# Patient Record
Sex: Male | Born: 1945
Health system: Southern US, Community
[De-identification: ages and names within clinical notes are randomized; demographics above are authoritative.]

## PROBLEM LIST (undated history)

## (undated) DIAGNOSIS — N189 Chronic kidney disease, unspecified: Secondary | ICD-10-CM

## (undated) DIAGNOSIS — R600 Localized edema: Secondary | ICD-10-CM

## (undated) DIAGNOSIS — IMO0001 Reserved for inherently not codable concepts without codable children: Secondary | ICD-10-CM

## (undated) DIAGNOSIS — Z950 Presence of cardiac pacemaker: Secondary | ICD-10-CM

## (undated) DIAGNOSIS — K219 Gastro-esophageal reflux disease without esophagitis: Secondary | ICD-10-CM

## (undated) DIAGNOSIS — I442 Atrioventricular block, complete: Secondary | ICD-10-CM

## (undated) DIAGNOSIS — N1832 Chronic kidney disease, stage 3b: Secondary | ICD-10-CM

## (undated) DIAGNOSIS — G5603 Carpal tunnel syndrome, bilateral upper limbs: Secondary | ICD-10-CM

## (undated) DIAGNOSIS — K589 Irritable bowel syndrome without diarrhea: Secondary | ICD-10-CM

## (undated) DIAGNOSIS — I10 Essential (primary) hypertension: Secondary | ICD-10-CM

## (undated) DIAGNOSIS — R011 Cardiac murmur, unspecified: Secondary | ICD-10-CM

## (undated) DIAGNOSIS — E785 Hyperlipidemia, unspecified: Secondary | ICD-10-CM

## (undated) DIAGNOSIS — Z9289 Personal history of other medical treatment: Secondary | ICD-10-CM

## (undated) DIAGNOSIS — M1990 Unspecified osteoarthritis, unspecified site: Secondary | ICD-10-CM

## (undated) DIAGNOSIS — I4891 Unspecified atrial fibrillation: Secondary | ICD-10-CM

## (undated) DIAGNOSIS — I509 Heart failure, unspecified: Secondary | ICD-10-CM

## (undated) DIAGNOSIS — T4145XA Adverse effect of unspecified anesthetic, initial encounter: Secondary | ICD-10-CM

## (undated) DIAGNOSIS — T8859XA Other complications of anesthesia, initial encounter: Secondary | ICD-10-CM

## (undated) DIAGNOSIS — G20A1 Parkinson's disease without dyskinesia, without mention of fluctuations: Secondary | ICD-10-CM

## (undated) DIAGNOSIS — G20 Parkinson's disease: Secondary | ICD-10-CM

## (undated) DIAGNOSIS — I2510 Atherosclerotic heart disease of native coronary artery without angina pectoris: Secondary | ICD-10-CM

## (undated) HISTORY — PX: INSERT / REPLACE / REMOVE PACEMAKER: SUR710

## (undated) HISTORY — DX: Atherosclerotic heart disease of native coronary artery without angina pectoris: I25.10

## (undated) HISTORY — PX: HERNIA REPAIR: SHX51

## (undated) HISTORY — PX: UPPER GI ENDOSCOPY: SHX6162

## (undated) HISTORY — DX: Hyperlipidemia, unspecified: E78.5

## (undated) HISTORY — DX: Essential (primary) hypertension: I10

## (undated) HISTORY — DX: Gastro-esophageal reflux disease without esophagitis: K21.9

## (undated) HISTORY — DX: Parkinson's disease without dyskinesia, without mention of fluctuations: G20.A1

## (undated) HISTORY — DX: Parkinson's disease: G20

---

## 2009-10-23 ENCOUNTER — Ambulatory Visit: Payer: Self-pay | Admitting: Family Medicine

## 2010-12-09 HISTORY — PX: APPENDECTOMY: SHX54

## 2010-12-22 ENCOUNTER — Ambulatory Visit: Payer: Self-pay | Admitting: Unknown Physician Specialty

## 2010-12-22 ENCOUNTER — Inpatient Hospital Stay: Payer: Self-pay | Admitting: General Surgery

## 2011-05-06 ENCOUNTER — Ambulatory Visit: Payer: Self-pay | Admitting: Endocrinology

## 2012-11-02 ENCOUNTER — Ambulatory Visit: Payer: Self-pay | Admitting: Family Medicine

## 2012-11-02 LAB — COMPREHENSIVE METABOLIC PANEL
Albumin: 4.2 g/dL (ref 3.4–5.0)
Anion Gap: 5 — ABNORMAL LOW (ref 7–16)
BUN: 13 mg/dL (ref 7–18)
Bilirubin,Total: 0.6 mg/dL (ref 0.2–1.0)
Calcium, Total: 8.9 mg/dL (ref 8.5–10.1)
Creatinine: 0.97 mg/dL (ref 0.60–1.30)
EGFR (African American): >60
EGFR (Non-African Amer.): >60
Potassium: 4.2 mmol/L (ref 3.5–5.1)
SGOT(AST): 18 U/L (ref 15–37)
Sodium: 142 mmol/L (ref 136–145)

## 2012-11-02 LAB — CBC WITH DIFFERENTIAL/PLATELET
Basophil %: 0.7 %
HCT: 48 % (ref 40.0–52.0)
Lymphocyte #: 1.2 10*3/uL (ref 1.0–3.6)
Lymphocyte %: 19.1 %
MCHC: 32.8 g/dL (ref 32.0–36.0)
MCV: 89 fL (ref 80–100)
Monocyte #: 0.5 x10 3/mm (ref 0.2–1.0)
Neutrophil #: 4.4 10*3/uL (ref 1.4–6.5)
Neutrophil %: 71.8 %
RDW: 13.4 % (ref 11.5–14.5)

## 2012-11-02 LAB — TSH: Thyroid Stimulating Horm: 1.33 u[IU]/mL

## 2012-11-20 ENCOUNTER — Ambulatory Visit: Payer: Self-pay | Admitting: Internal Medicine

## 2012-11-20 HISTORY — PX: CARDIAC CATHETERIZATION: SHX172

## 2012-12-14 ENCOUNTER — Ambulatory Visit: Payer: Self-pay

## 2013-03-15 ENCOUNTER — Ambulatory Visit (INDEPENDENT_AMBULATORY_CARE_PROVIDER_SITE_OTHER): Payer: BC Managed Care – PPO | Admitting: Cardiovascular Disease

## 2013-03-15 ENCOUNTER — Encounter: Payer: Self-pay | Admitting: Cardiovascular Disease

## 2013-03-15 VITALS — BP 164/84 | HR 62 | Ht 73.0 in | Wt 194.2 lb

## 2013-03-15 DIAGNOSIS — R079 Chest pain, unspecified: Secondary | ICD-10-CM

## 2013-03-15 DIAGNOSIS — R011 Cardiac murmur, unspecified: Secondary | ICD-10-CM | POA: Insufficient documentation

## 2013-03-15 DIAGNOSIS — R109 Unspecified abdominal pain: Secondary | ICD-10-CM

## 2013-03-15 DIAGNOSIS — G20 Parkinson's disease: Secondary | ICD-10-CM

## 2013-03-15 DIAGNOSIS — G20A1 Parkinson's disease without dyskinesia, without mention of fluctuations: Secondary | ICD-10-CM

## 2013-03-15 DIAGNOSIS — I10 Essential (primary) hypertension: Secondary | ICD-10-CM

## 2013-03-15 DIAGNOSIS — R0602 Shortness of breath: Secondary | ICD-10-CM | POA: Insufficient documentation

## 2013-03-15 DIAGNOSIS — E785 Hyperlipidemia, unspecified: Secondary | ICD-10-CM

## 2013-03-15 NOTE — Assessment & Plan Note (Signed)
I am concerned about his murmur. Stress echo did not comment on his valvular disease, only on LV function. We have discussed this with him. We will have him complete a full echocardiogram. Uncertain if he has significant valvular disease that could be contributing to shortness of breath.

## 2013-03-15 NOTE — Assessment & Plan Note (Signed)
Significant weight loss over the past year. Concern about progression of his Parkinson's. He's not eating as much to avoid GI symptoms.

## 2013-03-15 NOTE — Assessment & Plan Note (Signed)
He reports that some of his shortness of breath symptoms have improved without losartan. Echocardiogram to evaluate his murmur and valves. Blood pressure is high which could contribute to shortness of breath.

## 2013-03-15 NOTE — Assessment & Plan Note (Signed)
Blood pressures running high today as well as at home. He is on amlodipine for many years with no side effects. He held amlodipine for possible GI issues which have persisted. He we'll restart amlodipine 5 mg daily with close monitoring of his blood pressure. If no side effects, he could increase the dose back to 10 mg daily.

## 2013-03-15 NOTE — Assessment & Plan Note (Addendum)
Continued abdominal bloating and gas is a major issue for him. Etiology is not clear. Uncertain if this is part of his Parkinson's. He reports that he has tried [employer] production [employer].  Likely unrelated to his cardiac issues

## 2013-03-15 NOTE — Patient Instructions (Addendum)
Please start amlopdine 5 mg daily (1/2 pill) We will schedule you for an echocardiogram for murmur  Hold the cholesterol medication for now  Please call us if you have new issues that need to be addressed before your next appt.  Your physician wants you to follow-up in: 1 month.

## 2013-03-15 NOTE — Assessment & Plan Note (Signed)
Chest pain seems to have improved. Recent cardiac catheterization earlier this year showing no significant CAD.

## 2013-03-15 NOTE — Assessment & Plan Note (Signed)
He has progressive leg weakness, likely from his Parkinson's. We will hold his statin to see if leg strength improves.

## 2013-03-15 NOTE — Progress Notes (Signed)
Patient ID: Matthew Howe, male    DOB: Sep 05, 1946, 67 y.o.   MRN: [id number]  HPI Comments: Matthew Howe is a very pleasant 67 year old gentleman with diagnosis of Parkinson's who currently works Restaurant manager, fast food, history of hypertension, murmur, recent cardiac catheterization showing mild CAD after stress test at outpatient facility showed lateral wall hypokinesis on stress echo, hyperlipidemia who presents to establish care.  Previously reported having chest pain and tightness. Stress echo leading to cardiac catheterization at [facility].  Stress echo was a limited study, ejection fraction estimated at 45% with EF of 50% at peak stress.  Cardiac catheterization 11/20/2012 with 25% proximal LAD disease, followed by 40% lesion. The cardiac catheterization note makes mention of atrial flutter though arrhythmia is not mentioned in any of the clinic notes. He does have a tremor from Parkinson's which is likely causing EKG artifact and  His biggest complaint is GI gas. He has seen GI in consultation, EGD performed. By his report, no abnormalities noted. And denies having foods that will cause his gas. His GI gas is causing significant discomfort. He reports that losartan was causing significant shortness of breath. He stopped losartan 6 weeks ago and shortness of breath seemed to resolve. Legs are getting weaker that he continues to work. Plan is to retire next year  Abdominal CT 12/22/2010 showed acute appendicitis, colonic diverticulosis  EKG today shows normal sinus rhythm with rate 62 beats per minute him a right bundle branch block, left anterior fascicular block Initial EKG showed artifact concerning for atrial flutter. This resolved with a change of his hand position to eliminate tremor     Outpatient Encounter Prescriptions as of 03/15/2013  Medication Sig Dispense Refill  . aspirin 81 MG tablet Take 81 mg by mouth daily.      [name] atorvastatin (LIPITOR) 10 MG tablet Take 10 mg by mouth  daily.      . fluticasone (FLONASE) 50 MCG/ACT nasal spray Place 2 sprays into the nose daily.      . lansoprazole (PREVACID) 15 MG capsule Take 15 mg by mouth daily as needed.      [name] rOPINIRole (REQUIP) 0.25 MG tablet Take 0.25 mg by mouth 2 (two) times daily.         Review of Systems  Constitutional: Negative.   HENT: Negative.   Eyes: Negative.   Respiratory: Negative.   Cardiovascular: Negative.   Gastrointestinal: Negative.   Musculoskeletal: Negative.   Skin: Negative.   Neurological: Negative.   Psychiatric/Behavioral: Negative.   All other systems reviewed and are negative.    BP 164/84  Pulse 62  Ht 6\' 1"  (1.854 m)  Wt 194 lb 4 oz (88.111 kg)  BMI 25.63 kg/m2  Physical Exam  Nursing note and vitals reviewed. Constitutional: He is oriented to person, place, and time. He appears well-developed and well-nourished.  HENT:  Head: Normocephalic.  Nose: Nose normal.  Mouth/Throat: Oropharynx is clear and moist.  Eyes: Conjunctivae are normal. Pupils are equal, round, and reactive to light.  Neck: Normal range of motion. Neck supple. No JVD present.  Cardiovascular: Normal rate, regular rhythm, S1 normal, S2 normal and intact distal pulses.  Exam reveals no gallop and no friction rub.   Murmur heard.  Decrescendo systolic murmur is present with a grade of 2/6  Pulmonary/Chest: Effort normal and breath sounds normal. No respiratory distress. He has no wheezes. He has no rales. He exhibits no tenderness.  Abdominal: Soft. Bowel sounds are normal. He exhibits no distension.  There is no tenderness.  Musculoskeletal: Normal range of motion. He exhibits no edema and no tenderness.  Lymphadenopathy:    He has no cervical adenopathy.  Neurological: He is alert and oriented to person, place, and time. Coordination normal.  Skin: Skin is warm and dry. No rash noted. No erythema.  Psychiatric: He has a normal mood and affect. His behavior is normal. Judgment and thought content  normal.      Assessment and Plan

## 2013-03-19 ENCOUNTER — Other Ambulatory Visit: Payer: BC Managed Care – PPO

## 2013-03-20 ENCOUNTER — Other Ambulatory Visit: Payer: Self-pay

## 2013-03-20 DIAGNOSIS — R0602 Shortness of breath: Secondary | ICD-10-CM

## 2013-03-22 ENCOUNTER — Ambulatory Visit: Payer: Self-pay

## 2013-04-09 ENCOUNTER — Other Ambulatory Visit (INDEPENDENT_AMBULATORY_CARE_PROVIDER_SITE_OTHER): Payer: BC Managed Care – PPO

## 2013-04-09 ENCOUNTER — Telehealth: Payer: Self-pay

## 2013-04-09 ENCOUNTER — Other Ambulatory Visit: Payer: Self-pay

## 2013-04-09 DIAGNOSIS — R011 Cardiac murmur, unspecified: Secondary | ICD-10-CM

## 2013-04-09 NOTE — Telephone Encounter (Signed)
Pt dropped off BP reading log BP=154/84-174/79 HR=62-80 Will forward to Dr. Mariah Milling

## 2013-04-10 NOTE — Telephone Encounter (Signed)
Could add lisinopril 10 mg daily If he develops a cough, call the office

## 2013-04-10 NOTE — Telephone Encounter (Signed)
Echo was essentially normal,   mild to moderate leak of a valve which can be monitored periodically, not a major concern  For his blood pressure, would increase amlodipine to 10 mg daily given his recent numbers which are mildly elevated If he has side effects , such as lightheadedness, would call our office and go back to one half pill

## 2013-04-10 NOTE — Telephone Encounter (Signed)
Pt informed He says he is already taking amlodipine 5 mg BID Will discuss with Dr. Mariah Milling to see how he needs to adjust meds now that he is already taking 10 mg daily

## 2013-04-11 ENCOUNTER — Other Ambulatory Visit: Payer: Self-pay

## 2013-04-11 MED ORDER — LISINOPRIL 10 MG PO TABS: 10.0000 mg | ORAL_TABLET | Freq: Every day | ORAL | Status: DC

## 2013-04-11 MED ORDER — AMLODIPINE BESYLATE 10 MG PO TABS: 5.0000 mg | ORAL_TABLET | Freq: Two times a day (BID) | ORAL | Status: DC

## 2013-04-11 NOTE — Telephone Encounter (Signed)
lmtcb

## 2013-04-11 NOTE — Telephone Encounter (Signed)
Pt informed Understanding verb RX sent to pharm 

## 2013-04-18 ENCOUNTER — Encounter: Payer: Self-pay | Admitting: Cardiovascular Disease

## 2013-04-18 ENCOUNTER — Ambulatory Visit (INDEPENDENT_AMBULATORY_CARE_PROVIDER_SITE_OTHER): Payer: BC Managed Care – PPO | Admitting: Cardiovascular Disease

## 2013-04-18 VITALS — BP 140/72 | HR 58 | Ht 73.0 in | Wt 202.2 lb

## 2013-04-18 DIAGNOSIS — G20 Parkinson's disease: Secondary | ICD-10-CM

## 2013-04-18 DIAGNOSIS — R109 Unspecified abdominal pain: Secondary | ICD-10-CM

## 2013-04-18 DIAGNOSIS — I2510 Atherosclerotic heart disease of native coronary artery without angina pectoris: Secondary | ICD-10-CM

## 2013-04-18 DIAGNOSIS — R011 Cardiac murmur, unspecified: Secondary | ICD-10-CM

## 2013-04-18 DIAGNOSIS — I10 Essential (primary) hypertension: Secondary | ICD-10-CM

## 2013-04-18 DIAGNOSIS — E785 Hyperlipidemia, unspecified: Secondary | ICD-10-CM

## 2013-04-18 NOTE — Assessment & Plan Note (Signed)
Mild CAD by prior cardiac catheterization. No further workup at this time.

## 2013-04-18 NOTE — Patient Instructions (Addendum)
You are doing well. No medication changes were made.  Please call us if you have new issues that need to be addressed before your next appt.  Your physician wants you to follow-up in: 12 months.  You will receive a reminder letter in the mail two months in advance. If you don't receive a letter, please call our office to schedule the follow-up appointment. 

## 2013-04-18 NOTE — Assessment & Plan Note (Signed)
Abdominal pain is somewhat improved on dicyclomine.

## 2013-04-18 NOTE — Assessment & Plan Note (Signed)
We have talked about Parkinson's disease. Encouraged him to stay active, aerobic activity daily.

## 2013-04-18 NOTE — Assessment & Plan Note (Signed)
Murmur is from mild to moderate aortic valve insufficiency. No further workup at this time needed.

## 2013-04-18 NOTE — Progress Notes (Signed)
Patient ID: Matthew Howe, male    DOB: [birth year], 67 y.o.   MRN: [id number]  HPI Comments: Matthew Howe is a very pleasant 67 year old gentleman with diagnosis of Parkinson's who currently works Restaurant manager, fast food, history of hypertension, murmur, recent cardiac catheterization showing mild CAD after stress test at outpatient facility showed lateral wall hypokinesis on stress echo, hyperlipidemia who presents to establish care.  Previously reported having chest pain and tightness. Stress echo leading to cardiac catheterization at [facility].  Stress echo was a limited study, ejection fraction estimated at 45% with EF of 50% at peak stress.  Cardiac catheterization 11/20/2012 with 25% proximal LAD disease, followed by 40% lesion. The cardiac catheterization note makes mention of atrial flutter though arrhythmia is not mentioned in any of the clinic notes. He does have a tremor from Parkinson's which is likely causing EKG artifact  On his last clinic visit, he reported having significant GI gas. He has seen GI in consultation, EGD performed. By his report, no abnormalities noted. In followup today, he has been started on dicyclomine 4 times a day. This seems to have improved his symptoms somewhat. His wife reports when he took a break from work for several days, his symptoms seemed to resolve.  Recent Echocardiogram shows ejection fraction 50-55%, mild to moderate aortic valve insufficiency, otherwise normal study.  On his last clinic visit, he was started on amlodipine, lisinopril. He reports blood pressure has been well-controlled.  Abdominal CT 12/22/2010 showed acute appendicitis, colonic diverticulosis  EKG shows normal sinus rhythm Previous EKGs have had artifact from tremor     Outpatient Encounter Prescriptions as of 04/18/2013  Medication Sig Dispense Refill  . amLODipine (NORVASC) 10 MG tablet Take 0.5 tablets (5 mg total) by mouth 2 (two) times daily.  180 tablet  3  . aspirin  81 MG tablet Take 81 mg by mouth daily.      [name] dicyclomine (BENTYL) 20 MG tablet 20 mg 4 (four) times daily.       . fluticasone (FLONASE) 50 MCG/ACT nasal spray Place 2 sprays into the nose daily.      . lansoprazole (PREVACID) 15 MG capsule Take 15 mg by mouth daily as needed.      [name] lisinopril (PRINIVIL,ZESTRIL) 10 MG tablet Take 1 tablet (10 mg total) by mouth daily.  90 tablet  3  . rOPINIRole (REQUIP) 0.25 MG tablet Take 0.25 mg by mouth 2 (two) times daily.       . [DISCONTINUED] atorvastatin (LIPITOR) 10 MG tablet Take 10 mg by mouth daily.       No facility-administered encounter medications on file as of 04/18/2013.     Review of Systems  Constitutional: Negative.   HENT: Negative.   Eyes: Negative.   Respiratory: Negative.   Cardiovascular: Negative.   Gastrointestinal: Negative.   Musculoskeletal: Negative.   Skin: Negative.   Neurological: Negative.   Psychiatric/Behavioral: Negative.   All other systems reviewed and are negative.    BP 140/72  Pulse 58  Ht 6\' 1"  (1.854 m)  Wt 202 lb 4 oz (91.74 kg)  BMI 26.69 kg/m2  Physical Exam  Nursing note and vitals reviewed. Constitutional: He is oriented to person, place, and time. He appears well-developed and well-nourished.  HENT:  Head: Normocephalic.  Nose: Nose normal.  Mouth/Throat: Oropharynx is clear and moist.  Eyes: Conjunctivae are normal. Pupils are equal, round, and reactive to light.  Neck: Normal range of motion. Neck supple. No JVD present.  Cardiovascular:  Normal rate, regular rhythm, S1 normal, S2 normal and intact distal pulses.  Exam reveals no gallop and no friction rub.   Murmur heard.  Decrescendo systolic murmur is present with a grade of 2/6  Pulmonary/Chest: Effort normal and breath sounds normal. No respiratory distress. He has no wheezes. He has no rales. He exhibits no tenderness.  Abdominal: Soft. Bowel sounds are normal. He exhibits no distension. There is no tenderness.   Musculoskeletal: Normal range of motion. He exhibits no edema and no tenderness.  Lymphadenopathy:    He has no cervical adenopathy.  Neurological: He is alert and oriented to person, place, and time. Coordination normal.  Skin: Skin is warm and dry. No rash noted. No erythema.  Psychiatric: He has a normal mood and affect. His behavior is normal. Judgment and thought content normal.      Assessment and Plan

## 2013-04-18 NOTE — Assessment & Plan Note (Signed)
Blood pressure is well controlled on today's visit. No changes made to the medications. 

## 2013-04-18 NOTE — Assessment & Plan Note (Signed)
Lipitor was held on his last clinic visit. He reports improved arm and leg strength. We will hold his statin for now. Suggested he watch his weight, frequent exercise. Lipid panel once yearly. If lipids trend upwards, we could try an alternate statin or not statin medication.

## 2013-10-22 ENCOUNTER — Telehealth: Payer: Self-pay

## 2013-10-22 NOTE — Telephone Encounter (Signed)
Spoke w/ pt.  He would like to try cutting his amlodipine to 1/2 pill once daily and increasing lisinopril to 20 mg daily. Pt reports that he will monitor his BP and symptoms and let us know if either need to be addressed.  Pt to call if he feels he needs to make any changes in med regimen.

## 2013-10-22 NOTE — Telephone Encounter (Signed)
Spoke w/ pt.  He reports that he is experiencing worsening muscle cramps and stiffness. Reports that he previously had these sx on amlodipine and is pretty sure that this is the current cause. Pt taking amlodipine 10mg  1/2 tab bid. States that BP is under control and he feels that his mind is clearer despite his Parkinsonism, but the muscle issues are worsening. Would like to know if he can try another medication for his BP. Please advise. Thank you!

## 2013-10-22 NOTE — Telephone Encounter (Signed)
Pt called and would like to discuss a possible medication change Amlodipine. States he is aching in his joints, muscles are tight and stiff.

## 2013-10-22 NOTE — Telephone Encounter (Signed)
Options include decreasing the dose of amlodipine down to 5 mg daily, increasing the lisinopril up to 20 mg or 40 mg daily  Other option would be to stop the amlodipine, increase lisinopril up to 40 mg daily His blood pressure runs high, may need additional medication He could call the office if additional medication needed

## 2014-03-24 ENCOUNTER — Other Ambulatory Visit: Payer: Self-pay | Admitting: *Deleted

## 2014-03-24 MED ORDER — LISINOPRIL 10 MG PO TABS: 10.0000 mg | ORAL_TABLET | Freq: Every day | ORAL | Status: DC

## 2014-03-24 NOTE — Telephone Encounter (Signed)
Requested Prescriptions   Signed Prescriptions Disp Refills  . lisinopril (PRINIVIL,ZESTRIL) 10 MG tablet 90 tablet 0    Sig: Take 1 tablet (10 mg total) by mouth daily.    Authorizing Provider: GOLLAN, TIMOTHY J    Ordering User: LOPEZ, MARINA C    

## 2014-04-23 ENCOUNTER — Ambulatory Visit (INDEPENDENT_AMBULATORY_CARE_PROVIDER_SITE_OTHER): Payer: Medicare Other | Admitting: Cardiovascular Disease

## 2014-04-23 ENCOUNTER — Encounter: Payer: Self-pay | Admitting: Cardiovascular Disease

## 2014-04-23 VITALS — BP 128/70 | HR 53 | Ht 73.0 in | Wt 225.5 lb

## 2014-04-23 DIAGNOSIS — I10 Essential (primary) hypertension: Secondary | ICD-10-CM

## 2014-04-23 DIAGNOSIS — I2583 Coronary atherosclerosis due to lipid rich plaque: Principal | ICD-10-CM

## 2014-04-23 DIAGNOSIS — I2510 Atherosclerotic heart disease of native coronary artery without angina pectoris: Secondary | ICD-10-CM

## 2014-04-23 DIAGNOSIS — E785 Hyperlipidemia, unspecified: Secondary | ICD-10-CM

## 2014-04-23 DIAGNOSIS — G20 Parkinson's disease: Secondary | ICD-10-CM

## 2014-04-23 NOTE — Assessment & Plan Note (Signed)
Currently with no symptoms of angina. No further workup at this time. Continue current medication regimen. 

## 2014-04-23 NOTE — Assessment & Plan Note (Addendum)
Most recent lab work not available. Currently not on a statin. He does report cholesterol around 150. We have warned him on his recent weight gain and the effect that can have a blood pressure and cholesterol.

## 2014-04-23 NOTE — Progress Notes (Signed)
Patient ID: Matthew Howe, male    DOB: 07/28/1946, 68 y.o.   MRN: [id number]  HPI Comments: Matthew Howe is a very pleasant 68 year old gentleman with diagnosis of Parkinson's who currently works Investment banker, operational, history of hypertension, murmur, recent cardiac catheterization showing mild CAD after stress test at outpatient facility showed lateral wall hypokinesis on stress echo, hyperlipidemia who presents for routine followup.  He states that overall he is doing well. He does report having recent changes to his Parkinson's medication now with significantly improved tremor. Blood pressure has been excellent on amlodipine 5 mg twice a day with lisinopril daily. He is relatively active, works 2 days per week, other days does mowing and other chores around his house.  He's not sit for long periods of time  Denies having chest pain and tightness.  Previous Stress echo leading to cardiac catheterization at [facility].  Stress echo was a limited study, ejection fraction estimated at 45% with EF of 50% at peak stress.  Cardiac catheterization 11/20/2012 with 25% proximal LAD disease, followed by 40% lesion. The cardiac catheterization note makes mention of atrial flutter though arrhythmia is not mentioned in any of the clinic notes. He does have a tremor from Parkinson's which is likely causing EKG artifact  Recent Echocardiogram shows ejection fraction 50-55%, mild to moderate aortic valve insufficiency, otherwise normal study.  Abdominal CT 12/22/2010 showed acute appendicitis, colonic diverticulosis  EKG shows normal sinus rhythm with rate 53 beats per minute, right bundle branch block, nonspecific ST and T wave abnormality through the anterior precordial leads     Outpatient Encounter Prescriptions as of 04/23/2014  Medication Sig  . amLODipine (NORVASC) 10 MG tablet Take 0.5 tablets (5 mg total) by mouth 2 (two) times daily.  [name] aspirin 81 MG tablet Take 81 mg by mouth daily.  [name]  dicyclomine (BENTYL) 20 MG tablet 20 mg 4 (four) times daily.   . fluticasone (FLONASE) 50 MCG/ACT nasal spray Place 2 sprays into the nose daily.  . lansoprazole (PREVACID) 15 MG capsule Take 15 mg by mouth daily as needed.  [name] lisinopril (PRINIVIL,ZESTRIL) 10 MG tablet Take 1 tablet (10 mg total) by mouth daily.  . selegiline (ELDEPRYL) 5 MG tablet Take 5 mg by mouth 2 (two) times daily with a meal.     Review of Systems  Constitutional: Negative.   HENT: Negative.   Eyes: Negative.   Respiratory: Negative.   Cardiovascular: Negative.   Gastrointestinal: Negative.   Endocrine: Negative.   Musculoskeletal: Negative.   Skin: Negative.   Allergic/Immunologic: Negative.   Neurological: Negative.   Hematological: Negative.   Psychiatric/Behavioral: Negative.   All other systems reviewed and are negative.   BP 128/70  Pulse 53  Ht 6\' 1"  (1.854 m)  Wt 225 lb 8 oz (102.286 kg)  BMI 29.76 kg/m2  Physical Exam  Nursing note and vitals reviewed. Constitutional: He is oriented to person, place, and time. He appears well-developed and well-nourished.  HENT:  Head: Normocephalic.  Nose: Nose normal.  Mouth/Throat: Oropharynx is clear and moist.  Eyes: Conjunctivae are normal. Pupils are equal, round, and reactive to light.  Neck: Normal range of motion. Neck supple. No JVD present.  Cardiovascular: Normal rate, regular rhythm, S1 normal, S2 normal and intact distal pulses.  Exam reveals no gallop and no friction rub.   Murmur heard.  Decrescendo systolic murmur is present with a grade of 2/6  Pulmonary/Chest: Effort normal and breath sounds normal. No respiratory distress. He has  no wheezes. He has no rales. He exhibits no tenderness.  Abdominal: Soft. Bowel sounds are normal. He exhibits no distension. There is no tenderness.  Musculoskeletal: Normal range of motion. He exhibits no edema and no tenderness.  Lymphadenopathy:    He has no cervical adenopathy.  Neurological: He is  alert and oriented to person, place, and time. Coordination normal.  Skin: Skin is warm and dry. No rash noted. No erythema.  Psychiatric: He has a normal mood and affect. His behavior is normal. Judgment and thought content normal.      Assessment and Plan

## 2014-04-23 NOTE — Patient Instructions (Signed)
You are doing well. No medication changes were made.  Please call us if you have new issues that need to be addressed before your next appt.  Your physician wants you to follow-up in: 12 months.  You will receive a reminder letter in the mail two months in advance. If you don't receive a letter, please call our office to schedule the follow-up appointment. 

## 2014-04-23 NOTE — Assessment & Plan Note (Signed)
Significantly improved tremor on his current medications. Very active at baseline

## 2014-04-23 NOTE — Assessment & Plan Note (Signed)
Blood pressure is well controlled on today's visit. No changes made to the medications. 

## 2014-06-30 ENCOUNTER — Other Ambulatory Visit: Payer: Self-pay

## 2014-06-30 MED ORDER — LISINOPRIL 10 MG PO TABS: 10.0000 mg | ORAL_TABLET | Freq: Every day | ORAL | Status: DC

## 2014-09-01 ENCOUNTER — Telehealth: Payer: Self-pay | Admitting: Cardiovascular Disease

## 2014-09-01 NOTE — Telephone Encounter (Signed)
Would decrease the amlodipine down to 5 mg daily Alternative would be to stop the medication, monitor his blood pressure If blood pressure runs high, we will add additional medications

## 2014-09-01 NOTE — Telephone Encounter (Signed)
Spoke w/ pt.  He reports that he has been going on and off of amlodipine for some time due to cramps.  Reports that he thinks it is "messing with my meds and my stomach". He states that when he takes amlodipine, he has leg and stomach cramps, which have been worsening over the last 2-3 weeks, and he is now c/o constipation. States that constipation is worsening to the point that normal doses of OTC meds are no longer working for him.  He states that he does not want to take the amlodipine, as he has Parkinson's and "this is too much...surely something better has come along by now".  He states that although his BP has been well controlled, he would like to try a different type of med.  Please advise.  Thank you .

## 2014-09-01 NOTE — Telephone Encounter (Signed)
Pt had questions about meds please call pt

## 2014-09-02 NOTE — Telephone Encounter (Signed)
Spoke w/ pt's wife.  She will have pt call back, as pt is burying their dog that passed away this morning.

## 2014-09-02 NOTE — Telephone Encounter (Signed)
Spoke w/ pt. Advised him of Dr. Donivan Scull recommendation.  He verbalizes understanding and will decrease the amlodipine to 5 mg. He will call back w/ further questions or concerns.

## 2014-10-29 DIAGNOSIS — R251 Tremor, unspecified: Secondary | ICD-10-CM | POA: Diagnosis not present

## 2014-10-29 DIAGNOSIS — G20 Parkinson's disease: Secondary | ICD-10-CM | POA: Diagnosis not present

## 2014-11-18 ENCOUNTER — Inpatient Hospital Stay: Payer: Self-pay | Admitting: Internal Medicine

## 2014-11-18 DIAGNOSIS — R1030 Lower abdominal pain, unspecified: Secondary | ICD-10-CM | POA: Diagnosis not present

## 2014-11-18 DIAGNOSIS — I4510 Unspecified right bundle-branch block: Secondary | ICD-10-CM | POA: Diagnosis present

## 2014-11-18 DIAGNOSIS — R7989 Other specified abnormal findings of blood chemistry: Secondary | ICD-10-CM | POA: Diagnosis not present

## 2014-11-18 DIAGNOSIS — Z8249 Family history of ischemic heart disease and other diseases of the circulatory system: Secondary | ICD-10-CM | POA: Diagnosis not present

## 2014-11-18 DIAGNOSIS — R079 Chest pain, unspecified: Secondary | ICD-10-CM | POA: Diagnosis not present

## 2014-11-18 DIAGNOSIS — Z79899 Other long term (current) drug therapy: Secondary | ICD-10-CM | POA: Diagnosis not present

## 2014-11-18 DIAGNOSIS — I340 Nonrheumatic mitral (valve) insufficiency: Secondary | ICD-10-CM | POA: Diagnosis not present

## 2014-11-18 DIAGNOSIS — K5900 Constipation, unspecified: Secondary | ICD-10-CM | POA: Diagnosis not present

## 2014-11-18 DIAGNOSIS — Z888 Allergy status to other drugs, medicaments and biological substances status: Secondary | ICD-10-CM | POA: Diagnosis not present

## 2014-11-18 DIAGNOSIS — E785 Hyperlipidemia, unspecified: Secondary | ICD-10-CM | POA: Diagnosis present

## 2014-11-18 DIAGNOSIS — I4892 Unspecified atrial flutter: Secondary | ICD-10-CM | POA: Diagnosis not present

## 2014-11-18 DIAGNOSIS — I10 Essential (primary) hypertension: Secondary | ICD-10-CM | POA: Diagnosis not present

## 2014-11-18 DIAGNOSIS — R001 Bradycardia, unspecified: Secondary | ICD-10-CM | POA: Diagnosis not present

## 2014-11-18 DIAGNOSIS — I2510 Atherosclerotic heart disease of native coronary artery without angina pectoris: Secondary | ICD-10-CM | POA: Diagnosis not present

## 2014-11-18 DIAGNOSIS — J9811 Atelectasis: Secondary | ICD-10-CM | POA: Diagnosis not present

## 2014-11-18 DIAGNOSIS — K219 Gastro-esophageal reflux disease without esophagitis: Secondary | ICD-10-CM | POA: Diagnosis not present

## 2014-11-18 DIAGNOSIS — G20 Parkinson's disease: Secondary | ICD-10-CM | POA: Diagnosis not present

## 2014-11-18 DIAGNOSIS — N179 Acute kidney failure, unspecified: Secondary | ICD-10-CM | POA: Diagnosis not present

## 2014-11-18 DIAGNOSIS — Z800 Family history of malignant neoplasm of digestive organs: Secondary | ICD-10-CM | POA: Diagnosis not present

## 2014-11-18 DIAGNOSIS — R0602 Shortness of breath: Secondary | ICD-10-CM | POA: Diagnosis not present

## 2014-11-18 DIAGNOSIS — I248 Other forms of acute ischemic heart disease: Secondary | ICD-10-CM | POA: Diagnosis not present

## 2014-11-19 DIAGNOSIS — I2510 Atherosclerotic heart disease of native coronary artery without angina pectoris: Secondary | ICD-10-CM

## 2014-11-19 DIAGNOSIS — I340 Nonrheumatic mitral (valve) insufficiency: Secondary | ICD-10-CM

## 2014-11-19 DIAGNOSIS — I4892 Unspecified atrial flutter: Secondary | ICD-10-CM

## 2014-11-19 DIAGNOSIS — R7989 Other specified abnormal findings of blood chemistry: Secondary | ICD-10-CM

## 2014-11-19 DIAGNOSIS — I10 Essential (primary) hypertension: Secondary | ICD-10-CM

## 2014-11-20 ENCOUNTER — Telehealth: Payer: Self-pay

## 2014-11-20 DIAGNOSIS — K5909 Other constipation: Secondary | ICD-10-CM

## 2014-11-20 DIAGNOSIS — R109 Unspecified abdominal pain: Secondary | ICD-10-CM

## 2014-11-20 DIAGNOSIS — G20 Parkinson's disease: Secondary | ICD-10-CM

## 2014-11-20 NOTE — Telephone Encounter (Signed)
Correction:  Vickey Huger, NP

## 2014-11-20 NOTE — Telephone Encounter (Signed)
Pt wife called, states she is checking in the status of his appt with Dr. Allen Norris, GI Dr.. States pt still has aflutter, and is not eating this morning.

## 2014-11-20 NOTE — Telephone Encounter (Signed)
Received message from Dr. Rockey Situ that pt needs referral to GI for constipation, gas, abdominal pain, Parkinson's.   Left detailed message on Matthew Howe's vm that pt has appt w/ Matthew Snider, MD on Mon 2/15 @ 9:30. Pt to arrive at 9:15 @ [place], [address].  Advised that Dr. Allen Norris did not have a sooner appt and provided their office # [phone]. Asked her to call back w/ any questions or concerns.

## 2014-11-21 ENCOUNTER — Inpatient Hospital Stay (HOSPITAL_COMMUNITY)
Admission: EM | Admit: 2014-11-21 | Discharge: 2014-11-22 | DRG: 244 | Disposition: A | Discharge status: Home / Self Care | Payer: Medicare Other | Attending: Internal Medicine | Admitting: Internal Medicine

## 2014-11-21 ENCOUNTER — Encounter (HOSPITAL_COMMUNITY)
Admission: EM | Disposition: A | Discharge status: Home / Self Care | Payer: Self-pay | Source: Home / Self Care | Attending: Internal Medicine

## 2014-11-21 ENCOUNTER — Encounter (HOSPITAL_COMMUNITY): Payer: Self-pay | Admitting: *Deleted

## 2014-11-21 ENCOUNTER — Emergency Department (HOSPITAL_COMMUNITY): Payer: Medicare Other

## 2014-11-21 DIAGNOSIS — R0789 Other chest pain: Secondary | ICD-10-CM | POA: Diagnosis not present

## 2014-11-21 DIAGNOSIS — K219 Gastro-esophageal reflux disease without esophagitis: Secondary | ICD-10-CM | POA: Diagnosis present

## 2014-11-21 DIAGNOSIS — I455 Other specified heart block: Secondary | ICD-10-CM | POA: Diagnosis not present

## 2014-11-21 DIAGNOSIS — I10 Essential (primary) hypertension: Secondary | ICD-10-CM | POA: Diagnosis present

## 2014-11-21 DIAGNOSIS — Z7982 Long term (current) use of aspirin: Secondary | ICD-10-CM

## 2014-11-21 DIAGNOSIS — I442 Atrioventricular block, complete: Principal | ICD-10-CM

## 2014-11-21 DIAGNOSIS — G20 Parkinson's disease: Secondary | ICD-10-CM | POA: Diagnosis present

## 2014-11-21 DIAGNOSIS — I2510 Atherosclerotic heart disease of native coronary artery without angina pectoris: Secondary | ICD-10-CM | POA: Diagnosis present

## 2014-11-21 DIAGNOSIS — R001 Bradycardia, unspecified: Secondary | ICD-10-CM

## 2014-11-21 DIAGNOSIS — E785 Hyperlipidemia, unspecified: Secondary | ICD-10-CM | POA: Diagnosis present

## 2014-11-21 DIAGNOSIS — I5021 Acute systolic (congestive) heart failure: Secondary | ICD-10-CM

## 2014-11-21 DIAGNOSIS — J811 Chronic pulmonary edema: Secondary | ICD-10-CM | POA: Diagnosis not present

## 2014-11-21 DIAGNOSIS — Z8249 Family history of ischemic heart disease and other diseases of the circulatory system: Secondary | ICD-10-CM

## 2014-11-21 DIAGNOSIS — R918 Other nonspecific abnormal finding of lung field: Secondary | ICD-10-CM | POA: Diagnosis not present

## 2014-11-21 DIAGNOSIS — Z959 Presence of cardiac and vascular implant and graft, unspecified: Secondary | ICD-10-CM

## 2014-11-21 DIAGNOSIS — I459 Conduction disorder, unspecified: Secondary | ICD-10-CM

## 2014-11-21 DIAGNOSIS — I517 Cardiomegaly: Secondary | ICD-10-CM | POA: Diagnosis not present

## 2014-11-21 HISTORY — PX: PACEMAKER INSERTION: SHX728

## 2014-11-21 HISTORY — PX: PERMANENT PACEMAKER INSERTION: SHX5480

## 2014-11-21 HISTORY — DX: Atrioventricular block, complete: I44.2

## 2014-11-21 LAB — I-STAT CHEM 8, ED
BUN: 29 mg/dL — ABNORMAL HIGH (ref 6–23)
Calcium, Ion: 1.09 mmol/L — ABNORMAL LOW (ref 1.13–1.30)
Chloride: 102 mmol/L (ref 96–112)
Creatinine, Ser: 1.3 mg/dL (ref 0.50–1.35)
Glucose, Bld: 119 mg/dL — ABNORMAL HIGH (ref 70–99)
HCT: 45 % (ref 39.0–52.0)
Hemoglobin: 15.3 g/dL (ref 13.0–17.0)
POTASSIUM: 4.1 mmol/L (ref 3.5–5.1)
SODIUM: 138 mmol/L (ref 135–145)
TCO2: 19 mmol/L (ref 0–100)

## 2014-11-21 LAB — CBC WITH DIFFERENTIAL/PLATELET
BASOS PCT: 0 % (ref 0–1)
Basophils Absolute: 0 10*3/uL (ref 0.0–0.1)
Eosinophils Absolute: 0 10*3/uL (ref 0.0–0.7)
Eosinophils Relative: 0 % (ref 0–5)
HEMATOCRIT: 44.7 % (ref 39.0–52.0)
HEMOGLOBIN: 15.2 g/dL (ref 13.0–17.0)
Lymphocytes Relative: 9 % — ABNORMAL LOW (ref 12–46)
Lymphs Abs: 1 10*3/uL (ref 0.7–4.0)
MCH: 30.8 pg (ref 26.0–34.0)
MCHC: 34 g/dL (ref 30.0–36.0)
MCV: 90.5 fL (ref 78.0–100.0)
MONOS PCT: 11 % (ref 3–12)
Monocytes Absolute: 1.2 10*3/uL — ABNORMAL HIGH (ref 0.1–1.0)
NEUTROS PCT: 80 % — AB (ref 43–77)
Neutro Abs: 8.7 10*3/uL — ABNORMAL HIGH (ref 1.7–7.7)
Platelets: 245 10*3/uL (ref 150–400)
RBC: 4.94 MIL/uL (ref 4.22–5.81)
RDW: 13 % (ref 11.5–15.5)
WBC: 10.9 10*3/uL — ABNORMAL HIGH (ref 4.0–10.5)

## 2014-11-21 LAB — BASIC METABOLIC PANEL
Anion gap: 10 (ref 5–15)
BUN: 24 mg/dL — ABNORMAL HIGH (ref 6–23)
CALCIUM: 8.8 mg/dL (ref 8.4–10.5)
CO2: 24 mmol/L (ref 19–32)
Chloride: 103 mmol/L (ref 96–112)
Creatinine, Ser: 1.48 mg/dL — ABNORMAL HIGH (ref 0.50–1.35)
GFR calc Af Amer: 55 mL/min — ABNORMAL LOW (ref >90)
GFR calc non Af Amer: 47 mL/min — ABNORMAL LOW (ref >90)
GLUCOSE: 129 mg/dL — AB (ref 70–99)
Potassium: 4.2 mmol/L (ref 3.5–5.1)
SODIUM: 137 mmol/L (ref 135–145)

## 2014-11-21 LAB — I-STAT CG4 LACTIC ACID, ED: LACTIC ACID, VENOUS: 1.04 mmol/L (ref 0.5–2.0)

## 2014-11-21 LAB — I-STAT TROPONIN, ED: TROPONIN I, POC: 0.13 ng/mL — AB (ref 0.00–0.08)

## 2014-11-21 LAB — MAGNESIUM: Magnesium: 2.5 mg/dL (ref 1.5–2.5)

## 2014-11-21 LAB — BRAIN NATRIURETIC PEPTIDE: B NATRIURETIC PEPTIDE 5: 1506.1 pg/mL — AB (ref 0.0–100.0)

## 2014-11-21 LAB — TROPONIN I: Troponin I: 0.16 ng/mL — ABNORMAL HIGH (ref <0.031)

## 2014-11-21 SURGERY — PERMANENT PACEMAKER INSERTION
Anesthesia: LOCAL

## 2014-11-21 MED ORDER — SODIUM CHLORIDE 0.9 % IR SOLN
Status: AC
  Administered 2014-11-21: 15:00:00
  Filled 2014-11-21: qty 2

## 2014-11-21 MED ORDER — ASPIRIN 81 MG PO CHEW
324.0000 mg | CHEWABLE_TABLET | Freq: Once | ORAL | Status: AC
  Administered 2014-11-21: 324 mg via ORAL
  Filled 2014-11-21: qty 4

## 2014-11-21 MED ORDER — ACETAMINOPHEN 325 MG PO TABS: 325.0000 mg | ORAL_TABLET | ORAL | Status: DC | PRN

## 2014-11-21 MED ORDER — CEFAZOLIN SODIUM 1-5 GM-% IV SOLN
1.0000 g | Freq: Four times a day (QID) | INTRAVENOUS | Status: AC
  Administered 2014-11-21 – 2014-11-22 (×3): 1 g via INTRAVENOUS
  Filled 2014-11-21 (×4): qty 50

## 2014-11-21 MED ORDER — VANCOMYCIN HCL IN DEXTROSE 1-5 GM/200ML-% IV SOLN
1000.0000 mg | Freq: Once | INTRAVENOUS | Status: DC
  Filled 2014-11-21: qty 200

## 2014-11-21 MED ORDER — CHLORHEXIDINE GLUCONATE 4 % EX LIQD
60.0000 mL | Freq: Once | CUTANEOUS | Status: AC
  Filled 2014-11-21: qty 60

## 2014-11-21 MED ORDER — SELEGILINE HCL 5 MG PO TABS
5.0000 mg | ORAL_TABLET | Freq: Two times a day (BID) | ORAL | Status: DC
  Administered 2014-11-21 – 2014-11-22 (×2): 5 mg via ORAL
  Filled 2014-11-21 (×4): qty 1

## 2014-11-21 MED ORDER — SODIUM CHLORIDE 0.9 % IJ SOLN: 3.0000 mL | INTRAMUSCULAR | Status: DC | PRN

## 2014-11-21 MED ORDER — LIDOCAINE HCL (PF) 1 % IJ SOLN
INTRAMUSCULAR | Status: AC
  Filled 2014-11-21: qty 60

## 2014-11-21 MED ORDER — SODIUM CHLORIDE 0.9 % IJ SOLN
3.0000 mL | Freq: Two times a day (BID) | INTRAMUSCULAR | Status: DC
  Administered 2014-11-22: 3 mL via INTRAVENOUS

## 2014-11-21 MED ORDER — FLUTICASONE PROPIONATE 50 MCG/ACT NA SUSP
2.0000 | Freq: Every day | NASAL | Status: DC
  Administered 2014-11-22: 2 via NASAL
  Filled 2014-11-21: qty 16

## 2014-11-21 MED ORDER — ONDANSETRON HCL 4 MG/2ML IJ SOLN
4.0000 mg | Freq: Four times a day (QID) | INTRAMUSCULAR | Status: DC | PRN
Start: 2014-11-21 — End: 2014-11-22

## 2014-11-21 MED ORDER — DICYCLOMINE HCL 20 MG PO TABS
20.0000 mg | ORAL_TABLET | Freq: Three times a day (TID) | ORAL | Status: DC
  Administered 2014-11-21 – 2014-11-22 (×4): 20 mg via ORAL
  Filled 2014-11-21 (×8): qty 1

## 2014-11-21 MED ORDER — LISINOPRIL 10 MG PO TABS
10.0000 mg | ORAL_TABLET | Freq: Every day | ORAL | Status: DC
  Administered 2014-11-22: 10 mg via ORAL
  Filled 2014-11-21: qty 1

## 2014-11-21 MED ORDER — SODIUM CHLORIDE 0.9 % IV SOLN: INTRAVENOUS | Status: DC

## 2014-11-21 MED ORDER — FUROSEMIDE 10 MG/ML IJ SOLN
20.0000 mg | Freq: Once | INTRAMUSCULAR | Status: AC
  Administered 2014-11-21: 20 mg via INTRAVENOUS
  Filled 2014-11-21: qty 2

## 2014-11-21 MED ORDER — SODIUM CHLORIDE 0.9 % IV SOLN: 250.0000 mL | INTRAVENOUS | Status: DC | PRN

## 2014-11-21 MED ORDER — PIPERACILLIN-TAZOBACTAM 3.375 G IVPB: 3.3750 g | Freq: Once | INTRAVENOUS | Status: DC

## 2014-11-21 MED ORDER — ONDANSETRON HCL 4 MG/2ML IJ SOLN
4.0000 mg | Freq: Once | INTRAMUSCULAR | Status: AC
  Administered 2014-11-21: 4 mg via INTRAVENOUS
  Filled 2014-11-21: qty 2

## 2014-11-21 MED ORDER — CEFAZOLIN SODIUM-DEXTROSE 2-3 GM-% IV SOLR
2.0000 g | INTRAVENOUS | Status: DC
  Filled 2014-11-21: qty 50

## 2014-11-21 NOTE — Telephone Encounter (Signed)
Pt's wife reports that he is still in a flutter, he emptied his bowels yesterday, but still c/o severe ab pain. She has not checked his BP or HR. Asked her to check while I waited, 140/66, HR 43. Reports that pt felt good @ d/c, but started feeling bad again that night. Pt has not slept in 2 nights and has no energy.  She is crying, as she states that pt is hurting so bad.  Spoke w/ Dr. Rockey Situ.  He recommends that pt proceed to [facility] ED for possible ablation. Notified Ignacia Bayley, NP to make him aware, as well as Amber, who also recommends pt proceed to ED.  Spoke w/ pt's wife. She is agreeable and appreciative.

## 2014-11-21 NOTE — ED Notes (Signed)
Pt in from home c/o Mid non radiating CP onset yesterday worsening, pt was seen at [facility] last Tues & had dx of a fib, pt denies n/v/d c/o SOB, c/o dizziness, pt A&O x4, follows commands, 5/10 CP

## 2014-11-21 NOTE — Consult Note (Addendum)
ELECTROPHYSIOLOGY CONSULT NOTE    Patient ID: Matthew Howe MRN: [id number], DOB/AGE: 05/26/1946 69 y.o.  Admit date: 11/21/2014 Date of Consult: 11/21/2014  Primary Physician: Adrian Prows, MD Primary Cardiologist: Rockey Situ  Reason for Consultation: heart block  HPI:  Matthew Howe is a 69 y.o. male with a past medical history significant for hypertension, hyperlipidemia, GERD, and Parkinson's disease.  He presented to [facility] on Tuesday of this week with chest pain and shortness of breath.  He was found to be in heart block.  His Amlodipine was discontinued with improvement in his conduction and he was discharged to home.  Echocardiogram demonstrated normal EF, mildly dilated LA, mild AI.  He then had recurrent chest pain and shortness of breath starting yesterday afternoon that progressively worsened.  He called Dr Donivan Scull office this morning and was advised to come to [facility] for further evaluation.  On arrival, he was found to be in complete heart block with ventricular rates in the 30-40's.  EP has been asked to evaluate for treatment options.  Catheterization 11/2012 demonstrated 25% LAD disease followed by a 40% lesion.    He states that his chest discomfort comes and goes but has been present since yesterday and correlates with decreased heart rate.  He has chronic abdominal pain and constipation.  He also has a chronic non productive cough.  He denies syncope, palpitations, recent fevers, chills, nausea or vomiting.  ROS is otherwise negative.  He has chronic bifascicular block dating back to 2014 with RBBB and LAFB.   Lab work demonstrates normal electrolytes, WBC 10.9.    Past Medical History  Diagnosis Date  . Hyperlipidemia   . Hypertension   . GERD (gastroesophageal reflux disease)   . Coronary artery disease   . Parkinson's disease      Surgical History:  Past Surgical History  Procedure Laterality Date  . Colonoscopy    . Upper gi endoscopy    . Cardiac  catheterization  11/20/2012    Known CAD with one vessel coronary disease of left descending artery by cardiac cath.   . Appendectomy  12/2010      Current facility-administered medications:  .  furosemide (LASIX) injection 20 mg, 20 mg, Intravenous, Once, Ezequiel Essex, MD  Current outpatient prescriptions:  .  aspirin 81 MG tablet, Take 81 mg by mouth daily., Disp: , Rfl:  .  dicyclomine (BENTYL) 20 MG tablet, 20 mg 4 (four) times daily. , Disp: , Rfl:  .  fluticasone (FLONASE) 50 MCG/ACT nasal spray, Place 2 sprays into the nose daily., Disp: , Rfl:  .  lactulose (CHRONULAC) 10 GM/15ML solution, Take 20 g by mouth 2 (two) times daily as needed for mild constipation., Disp: , Rfl:  .  lisinopril (PRINIVIL,ZESTRIL) 10 MG tablet, Take 1 tablet (10 mg total) by mouth daily., Disp: 90 tablet, Rfl: 3 .  magnesium hydroxide (MILK OF MAGNESIA) 400 MG/5ML suspension, Take 30 mLs by mouth daily as needed for mild constipation., Disp: , Rfl:  .  selegiline (ELDEPRYL) 5 MG tablet, Take 5 mg by mouth 2 (two) times daily with a meal. , Disp: , Rfl:  .  amLODipine (NORVASC) 10 MG tablet, Take 0.5 tablets (5 mg total) by mouth 2 (two) times daily. (Patient not taking: Reported on 11/21/2014), Disp: 180 tablet, Rfl: 3  Inpatient Medications:  . furosemide  20 mg Intravenous Once    Allergies:  Allergies  Allergen Reactions  . Codeine   . Norco [Hydrocodone-Acetaminophen] Diarrhea and Nausea  And Vomiting  . Omeprazole Diarrhea and Nausea And Vomiting    History   Social History  . Marital Status: Married    Spouse Name: N/A  . Number of Children: N/A  . Years of Education: N/A   Occupational History  . Not on file.   Social History Main Topics  . Smoking status: Never Smoker   . Smokeless tobacco: Not on file  . Alcohol Use: No  . Drug Use: No  . Sexual Activity: Not on file   Other Topics Concern  . Not on file   Social History Narrative  . No narrative on file     Family  History  Problem Relation Age of Onset  . Heart disease Mother     BP 151/61 mmHg  Pulse 41  Temp(Src) 97.9 F (36.6 C) (Oral)  Resp 20  Ht 6\' 1"  (1.854 m)  Wt 220 lb (99.791 kg)  BMI 29.03 kg/m2  SpO2 93%  Physical Exam: Filed Vitals:   11/21/14 1300 11/21/14 1315 11/21/14 1330 11/21/14 1345  BP: 138/50 139/58 133/54 136/48  Pulse: 39 38 38 39  Temp:      TempSrc:      Resp: 25 18 17 19   Height:      Weight:      SpO2: 95% 94% 93% 90%    GEN- The patient is well appearing, alert and oriented x 3 today.   Head- normocephalic, atraumatic Eyes-  Sclera clear, conjunctiva pink Ears- hearing intact Oropharynx- clear Neck- supple, Lungs- Clear to ausculation bilaterally, normal work of breathing Heart- bradycardic regular rhythm GI- soft, NT, ND, + BS Extremities- no clubbing, cyanosis, or edema, groin is without hematoma/ bruit MS- no significant deformity or atrophy Skin- no rash or lesion Psych- euthymic mood, flat affect Neuro- R arm tremor is observed, strength and sensation are intact    Labs:   Lab Results  Component Value Date   WBC 10.9* 11/21/2014   HGB 15.2 11/21/2014   HCT 44.7 11/21/2014   MCV 90.5 11/21/2014   PLT 245 11/21/2014    Recent Labs Lab 11/21/14 1020  NA 137  K 4.2  CL 103  CO2 24  BUN 24*  CREATININE 1.48*  CALCIUM 8.8  GLUCOSE 129*   Lab Results  Component Value Date   TROPONINI 0.16* 11/21/2014    Radiology/Studies: Dg Chest Portable 1 View 11/21/2014   CLINICAL DATA:  Chest tightness shortness of breath for 1 day. Possible history of atrial fibrillation.  EXAM: PORTABLE CHEST - 1 VIEW  COMPARISON:  11/18/2014  FINDINGS: Grossly unchanged enlarged cardiac silhouette and mediastinal contours. Developing airspace opacity within the right infrahilar lung. Pulmonary venous congestion without frank evidence of edema. Unchanged bibasilar heterogeneous opacities, left greater than right favored to represent atelectasis or scar. No  pleural effusion pneumothorax. Grossly unchanged bones.  IMPRESSION: 1. Developing airspace opacity with the right infrahilar lung worrisome for infection. A follow-up chest radiograph in 4 to 6 weeks after treatment is recommended to ensure resolution. 2. Cardiomegaly and pulmonary venous congestion without frank evidence of edema.   Electronically Signed   By: Sandi Mariscal M.D.   On: 11/21/2014 10:47    EKG: complete heart block, rate 41, RBBB, LAFB  TELEMETRY: complete heart block, ventricular rates 30-40's  A/P 1. Complete heart block The patient has symptomatic complete heart block.  He has a longstanding history of bifascicular block suggesting chronic and progressive conduction system disease.  No reversible causes have been found.  I would therefore recommend pacemaker implantation at this time.  Risks, benefits, alternatives to pacemaker implantation were discussed in detail with the patient today. The patient understands that the risks include but are not limited to bleeding, infection, pneumothorax, perforation, tamponade, vascular damage, renal failure, MI, stroke, death,  and lead dislodgement and wishes to proceed. We will therefore schedule the procedure at the next available time.  He has not required MRIs in the past.  We discussed newer MRI technologies.  He is clear that he would prefer a traditional (non MRI) device.  He is currently very ill and warrants acute intervention with PPM.  His is at high risk for decompensation/ sudden death.  A high level of decision making is required.  2. HTN Stable No change required today  3. parkinsons Stable No change required today  Proceed with PPM Will plan to observe post procedure for 23 hours.

## 2014-11-21 NOTE — ED Notes (Signed)
EDP at bedside  

## 2014-11-21 NOTE — Op Note (Signed)
SURGEON:  Thompson Grayer, MD     PREPROCEDURE DIAGNOSIS:  Symptomatic complete heart block    POSTPROCEDURE DIAGNOSIS:  Symptomatic complete heart block     PROCEDURES:   1. Left upper extremity venography.   2. Pacemaker implantation.     INTRODUCTION:  Matthew Howe is a 69 y.o. male with a history of complete heart block who presents today for pacemaker implantation.  The patient reports intermittent episodes of dizziness, fatigue, and chest discomfort over the past few days.  He has chronic bifascicular block and has now advanced to complete heart block.  No reversible causes have been identified.  The patient therefore presents today for pacemaker implantation.     DESCRIPTION OF PROCEDURE:  Informed written consent was obtained, and  the patient was brought to the electrophysiology lab in a fasting state.  The patient required no sedation for the procedure today.  The patients left chest was prepped and draped in the usual sterile fashion by the EP lab staff. The skin overlying the left deltopectoral region was infiltrated with lidocaine for local analgesia.  A 4-cm incision was made over the left deltopectoral region.  A left subcutaneous pacemaker pocket was fashioned using a combination of sharp and blunt dissection. Electrocautery was required to assure hemostasis.    Left Upper Extremity Venography: A venogram of the left upper extremity was performed, which revealed a large left cephalic vein, which emptied into a large left subclavian vein.  The left axillary vein was moderate in size.    RA/RV Lead Placement: The left axillary vein was therefore cannulated.  Through the left axillary vein, a [employer] model [phone] (serial number  [id number]) right atrial lead and a [facility] model [phone] (serial number  [id number]) right ventricular lead were advanced with fluoroscopic visualization into the right atrial appendage and right ventricular apex positions respectively.  Initial  atrial lead P- waves measured 1.2 mV with impedance of 585 ohms and a threshold of 0.75 V at 0.5 msec.  Right ventricular lead R-waves measured 24 mV with an impedance of 833 ohms and a threshold of 0.8 V at 0.5 msec.  The entire RV lead was used due to a large RV.  Multiple positionings were performed of the RV lead with multiple views (ROA, AP, LAO) observed to confirm that this was appropriate RV lead position.  Both leads were secured to the pectoralis fascia using #2-0 silk over the suture sleeves.   Device Placement:  The leads were then connected to a [employer]  model [id number] (serial number  [id number]) pacemaker.  The pocket was irrigated with copious gentamicin solution.  The pacemaker was then placed into the pocket.  The pocket was then closed in 2 layers with 2.0 Vicryl suture for the subcutaneous and subcuticular layers.  Steri-  Strips and a sterile dressing were then applied. EBL<52ml.  There were no early apparent complications.     CONCLUSIONS:   1. Successful implantation of a [employer]  dual-chamber pacemaker for symptomatic complete heart block  2. No early apparent complications.       Permanent Pacemaker Indication: Documented non-reversible symptomatic bradycardia due to second degree and/or third degree atrioventricular block.     Thompson Grayer, MD 11/21/2014 3:41 PM

## 2014-11-21 NOTE — ED Notes (Signed)
EKG completed and given to EDP.  

## 2014-11-21 NOTE — Progress Notes (Signed)
Orthopedic Tech Progress Note Patient Details:  Matthew Howe Jul 24, 1946 [id number] Patient already has arm sling. Patient ID: Matthew Howe, male   DOB: 03/07/1946, 69 y.o.   MRN: [id number]   [name] 11/21/2014, 5:33 PM

## 2014-11-21 NOTE — H&P (Signed)
CARDIOLOGY H&P NOTE  HPI:  69 y/o male with Parkinson's disease, HTN and GERD. Presents with 1 week of progressive, severe fatigue and DOE.   Denies h/o known heart disease. Cardiac cath by Dr. Nehemiah Massed fall 2014 was reportedly totally normal.   Over past week progressive fatigue and DOE with occasional chest pressure. Admitted to [facility] yesterday with HR in 30s. Echo was normal. Was discharged home. Today with recurrent symptoms. Presented to [facility] ER. Found to be in CHB with junctional escape in 59s. SBP 150. Not on any AVN blockers. Potassium ok. Denies any exertional ischemic symptoms in weeks prior to bradycardia.   CXR read as ? PNA. Has chronic non-productive cough. No fevers/chills.   Review of Systems:     Cardiac Review of Systems: {Y] = yes [ ]  = no  Chest Pain [   y ]  Resting SOB [   ] Exertional SOB  [ y ]  Orthopnea [  ]   Pedal Edema [ y  ]    Palpitations [  ] Syncope  [  ]   Presyncope [   ]  General Review of Systems: [Y] = yes [  ]=no Constitional: recent weight change [  ]; anorexia [  ]; fatigue [ y ]; nausea [  ]; night sweats [  ]; fever [  ]; or chills [  ];                                                                     Dental: poor dentition[  ];   Eye : blurred vision [  ]; diplopia [   ]; vision changes [  ];  Amaurosis fugax[  ]; Resp: cough Blue.Reese  ];  wheezing[  ];  hemoptysis[  ]; shortness of breath[  ]; paroxysmal nocturnal dyspnea[  ]; dyspnea on exertion[  ]; or orthopnea[  ];  GI:  gallstones[  ], vomiting[  ];  dysphagia[  ]; melena[  ];  hematochezia [  ]; heartburn[  y];   GU: kidney stones [  ]; hematuria[  ];   dysuria [  ];  nocturia[  ];               Skin: rash [  ], swelling[  ];, hair loss[  ];  peripheral edema[  ];  or itching[  ]; Musculosketetal: myalgias[  ];  joint swelling[  ];  joint erythema[  ];  joint pain[y  ];  back pain[  ];  Heme/Lymph: bruising[  ];  bleeding[  ];  anemia[  ];  Neuro: TIA[  ];  headaches[  ];  stroke[  ];   vertigo[  ];  seizures[  ];   paresthesias[  ];  difficulty walking[  ];  Psych:depression[  ]; anxiety[  ];  Endocrine: diabetes[  ];  thyroid dysfunction[  ];  Other:  Past Medical History  Diagnosis Date  . Hyperlipidemia   . Hypertension   . GERD (gastroesophageal reflux disease)   . Coronary artery disease   . Parkinson's disease     Prior to Admission medications   Medication Sig Start Date End Date Taking? Authorizing Provider  aspirin 81 MG tablet Take 81 mg by mouth daily.  Yes Historical Provider, MD  dicyclomine (BENTYL) 20 MG tablet 20 mg 4 (four) times daily.  04/04/13  Yes Historical Provider, MD  fluticasone (FLONASE) 50 MCG/ACT nasal spray Place 2 sprays into the nose daily.   Yes Historical Provider, MD  lactulose (CHRONULAC) 10 GM/15ML solution Take 20 g by mouth 2 (two) times daily as needed for mild constipation.   Yes Historical Provider, MD  lisinopril (PRINIVIL,ZESTRIL) 10 MG tablet Take 1 tablet (10 mg total) by mouth daily. 06/30/14  Yes Minna Merritts, MD  magnesium hydroxide (MILK OF MAGNESIA) 400 MG/5ML suspension Take 30 mLs by mouth daily as needed for mild constipation.   Yes Historical Provider, MD  selegiline (ELDEPRYL) 5 MG tablet Take 5 mg by mouth 2 (two) times daily with a meal.    Yes Historical Provider, MD  amLODipine (NORVASC) 10 MG tablet Take 0.5 tablets (5 mg total) by mouth 2 (two) times daily. Patient not taking: Reported on 11/21/2014 04/11/13   Minna Merritts, MD      Allergies  Allergen Reactions  . Codeine   . Norco [Hydrocodone-Acetaminophen] Diarrhea and Nausea And Vomiting  . Omeprazole Diarrhea and Nausea And Vomiting    History   Social History  . Marital Status: Married    Spouse Name: N/A  . Number of Children: N/A  . Years of Education: N/A   Occupational History  . Not on file.   Social History Main Topics  . Smoking status: Never Smoker   . Smokeless tobacco: Not on file  . Alcohol Use: No  . Drug Use: No    . Sexual Activity: Not on file   Other Topics Concern  . Not on file   Social History Narrative  . No narrative on file    Family History  Problem Relation Age of Onset  . Heart disease Mother     PHYSICAL EXAM: Filed Vitals:   11/21/14 1150  BP: 151/61  Pulse: 41  Temp:   Resp: 20   General:  Fatigued appearing. No respiratory difficulty HEENT: normal Neck: supple. JVP 9. Carotids 2+ bilat; no bruits. No lymphadenopathy or thryomegaly appreciated. Cor: PMI nondisplaced. Brady regular No rubs, gallops or murmurs. Lungs: crackles at bases Abdomen: soft, nontender, nondistended. No hepatosplenomegaly. No bruits or masses. Good bowel sounds. Extremities: no cyanosis, clubbing, rash, 1+ edema Neuro: alert & oriented x 3, cranial nerves grossly intact. moves all 4 extremities w/o difficulty. Affect pleasant.   ECG:  CHB with junctional escape 41. RBBB. LAFB qrs 145  Results for orders placed or performed during the hospital encounter of 11/21/14 (from the past 24 hour(s))  CBC with Differential/Platelet     Status: Abnormal   Collection Time: 11/21/14 10:20 AM  Result Value Ref Range   WBC 10.9 (H) 4.0 - 10.5 K/uL   RBC 4.94 4.22 - 5.81 MIL/uL   Hemoglobin 15.2 13.0 - 17.0 g/dL   HCT 44.7 39.0 - 52.0 %   MCV 90.5 78.0 - 100.0 fL   MCH 30.8 26.0 - 34.0 pg   MCHC 34.0 30.0 - 36.0 g/dL   RDW 13.0 11.5 - 15.5 %   Platelets 245 150 - 400 K/uL   Neutrophils Relative % 80 (H) 43 - 77 %   Neutro Abs 8.7 (H) 1.7 - 7.7 K/uL   Lymphocytes Relative 9 (L) 12 - 46 %   Lymphs Abs 1.0 0.7 - 4.0 K/uL   Monocytes Relative 11 3 - 12 %   Monocytes Absolute 1.2 (  H) 0.1 - 1.0 K/uL   Eosinophils Relative 0 0 - 5 %   Eosinophils Absolute 0.0 0.0 - 0.7 K/uL   Basophils Relative 0 0 - 1 %   Basophils Absolute 0.0 0.0 - 0.1 K/uL  Basic metabolic panel     Status: Abnormal   Collection Time: 11/21/14 10:20 AM  Result Value Ref Range   Sodium 137 135 - 145 mmol/L   Potassium 4.2 3.5 - 5.1  mmol/L   Chloride 103 96 - 112 mmol/L   CO2 24 19 - 32 mmol/L   Glucose, Bld 129 (H) 70 - 99 mg/dL   BUN 24 (H) 6 - 23 mg/dL   Creatinine, Ser 1.48 (H) 0.50 - 1.35 mg/dL   Calcium 8.8 8.4 - 10.5 mg/dL   GFR calc non Af Amer 47 (L) >90 mL/min   GFR calc Af Amer 55 (L) >90 mL/min   Anion gap 10 5 - 15  Troponin I     Status: Abnormal   Collection Time: 11/21/14 10:20 AM  Result Value Ref Range   Troponin I 0.16 (H) <0.031 ng/mL  Brain natriuretic peptide     Status: Abnormal   Collection Time: 11/21/14 10:20 AM  Result Value Ref Range   B Natriuretic Peptide 1506.1 (H) 0.0 - 100.0 pg/mL  Magnesium     Status: None   Collection Time: 11/21/14 10:20 AM  Result Value Ref Range   Magnesium 2.5 1.5 - 2.5 mg/dL  I-stat troponin, ED     Status: Abnormal   Collection Time: 11/21/14 12:12 PM  Result Value Ref Range   Troponin i, poc 0.13 (HH) 0.00 - 0.08 ng/mL   Comment NOTIFIED PHYSICIAN    Comment 3           Dg Chest Portable 1 View  11/21/2014   CLINICAL DATA:  Chest tightness shortness of breath for 1 day. Possible history of atrial fibrillation.  EXAM: PORTABLE CHEST - 1 VIEW  COMPARISON:  11/18/2014  FINDINGS: Grossly unchanged enlarged cardiac silhouette and mediastinal contours. Developing airspace opacity within the right infrahilar lung. Pulmonary venous congestion without frank evidence of edema. Unchanged bibasilar heterogeneous opacities, left greater than right favored to represent atelectasis or scar. No pleural effusion pneumothorax. Grossly unchanged bones.  IMPRESSION: 1. Developing airspace opacity with the right infrahilar lung worrisome for infection. A follow-up chest radiograph in 4 to 6 weeks after treatment is recommended to ensure resolution. 2. Cardiomegaly and pulmonary venous congestion without frank evidence of edema.   Electronically Signed   By: Sandi Mariscal M.D.   On: 11/21/2014 10:47     ASSESSMENT: 1. Symptomatic bradycardia due to CHB 2. Mild acute  diastolic HF due to #1 3. HTN 4. Parkinson's disease 5. EF normal by echo yesterday at [facility]  PLAN/DISCUSSION:  He has CHB due to intrinsic conduction disease. He does have CP but doubt bradycardia is due to ischemia as he had negative cath in Fall 2014. Will consult EP for PPM. Suspect he has mild HF in setting of CHB and would diurese after PPM. Repeat CXR in am to exclude developing PNA read on original film.   Charlotte Brafford,MD 12:50 PM

## 2014-11-21 NOTE — ED Provider Notes (Signed)
CSN: [id number]     Arrival date & time 11/21/14  6720 History  This chart was scribed for Ezequiel Essex, MD by Ludger Nutting, ED Scribe. This patient was seen in room B16C/B16C and the patient's care was started 10:03 AM.    Chief Complaint  Patient presents with  . Chest Pain  . Shortness of Breath   The history is provided by the patient. No language interpreter was used.     HPI Comments: Matthew Howe is a 69 y.o. male with past medical history of GERD, HLD, HTN, CAD, Parkinson's disease who presents to the Emergency Department complaining of 3-4 days of constant, waxing and waning, non-radiating, left chest pain with associated SOB and chest tightness. He rates his pain as 5-6/10 currently and describes the pain as pressure. Patient states he was seen at [facility] yesterday and was hospitalized overnight for atrial flutter. He states his BP medication was discontinued at [facility] and he was not started on any new medications. He spoke with his cardiologist who advised he return to the ED due to continued chest pain and SOB. He complains of current dizziness and a dull HA. He states his SOB is worse with laying down. He reports having a cardiac catheterization 2 years ago. He denies history of asthma or COPD, DVT/PE. He denies taking anti-coagulants. He denies lightheadedness, abdominal pain, nausea, vomiting.    Past Medical History  Diagnosis Date  . Hyperlipidemia   . Hypertension   . GERD (gastroesophageal reflux disease)   . Coronary artery disease   . Parkinson's disease   . Complete heart block    Past Surgical History  Procedure Laterality Date  . Colonoscopy    . Upper gi endoscopy    . Cardiac catheterization  11/20/2012    Known CAD with one vessel coronary disease of left descending artery by cardiac cath.   . Appendectomy  12/2010  . Pacemaker insertion  11-21-14    STJ Assurity dual chamber pacemaker implanted by Dr Rayann Heman for CHB   Family History  Problem Relation Age of  Onset  . Heart disease Mother    History  Substance Use Topics  . Smoking status: Never Smoker   . Smokeless tobacco: Not on file  . Alcohol Use: No    Review of Systems  A complete 10 system review of systems was obtained and all systems are negative except as noted in the HPI and PMH.    Allergies  Codeine; Norco; and Omeprazole  Home Medications   Prior to Admission medications   Medication Sig Start Date End Date Taking? Authorizing Provider  aspirin 81 MG tablet Take 81 mg by mouth daily.   Yes Historical Provider, MD  dicyclomine (BENTYL) 20 MG tablet 20 mg 4 (four) times daily.  04/04/13  Yes Historical Provider, MD  fluticasone (FLONASE) 50 MCG/ACT nasal spray Place 2 sprays into the nose daily.   Yes Historical Provider, MD  lactulose (CHRONULAC) 10 GM/15ML solution Take 20 g by mouth 2 (two) times daily as needed for mild constipation.   Yes Historical Provider, MD  lisinopril (PRINIVIL,ZESTRIL) 10 MG tablet Take 1 tablet (10 mg total) by mouth daily. 06/30/14  Yes Minna Merritts, MD  magnesium hydroxide (MILK OF MAGNESIA) 400 MG/5ML suspension Take 30 mLs by mouth daily as needed for mild constipation.   Yes Historical Provider, MD  selegiline (ELDEPRYL) 5 MG tablet Take 5 mg by mouth 2 (two) times daily with a meal.    Yes  Historical Provider, MD  amLODipine (NORVASC) 10 MG tablet Take 0.5 tablets (5 mg total) by mouth 2 (two) times daily. Patient not taking: Reported on 11/21/2014 04/11/13   Minna Merritts, MD   BP 144/68 mmHg  Pulse 78  Temp(Src) 98.4 F (36.9 C) (Oral)  Resp 20  Ht 6\' 1"  (1.854 m)  Wt 487 lb 14.1 oz (221.3 kg)  BMI 64.38 kg/m2  SpO2 90% Physical Exam  Constitutional: He is oriented to person, place, and time. He appears well-developed and well-nourished. No distress.  HENT:  Head: Normocephalic and atraumatic.  Mouth/Throat: Oropharynx is clear and moist. No oropharyngeal exudate.  Eyes: Conjunctivae and EOM are normal. Pupils are equal,  round, and reactive to light.  Neck: Normal range of motion. Neck supple.  No meningismus.  Cardiovascular: Normal heart sounds and intact distal pulses.  An irregular rhythm present. Bradycardia present.   No murmur heard. Irregular bradycardic rhythm. +1 pedal edema to knees. Intact DP/PT pulses.    Pulmonary/Chest: Effort normal. No respiratory distress. He has decreased breath sounds (at the bases).  Mild increased work of breathing. Decreased breath sounds at the bases.  Abdominal: Soft. There is no tenderness. There is no rebound and no guarding.  Musculoskeletal: Normal range of motion. He exhibits no edema or tenderness.  Neurological: He is alert and oriented to person, place, and time. No cranial nerve deficit. He exhibits normal muscle tone. Coordination normal.  No ataxia on finger to nose bilaterally. No pronator drift. 5/5 strength throughout. CN 2-12 intact. Negative Romberg. Equal grip strength. Sensation intact. Gait is normal.   Skin: Skin is warm.  Psychiatric: He has a normal mood and affect. His behavior is normal.  Nursing note and vitals reviewed.   ED Course  Procedures (including critical care time)  DIAGNOSTIC STUDIES: Oxygen Saturation is 91% on [place] 3 L/min, low by my interpretation.    COORDINATION OF CARE: 10:18 AM Will order labs, CXR, and cardiac monitoring; will give ASA. Discussed treatment plan with pt at bedside and pt agreed to plan.   Labs Review Labs Reviewed  CBC WITH DIFFERENTIAL/PLATELET - Abnormal; Notable for the following:    WBC 10.9 (*)    Neutrophils Relative % 80 (*)    Neutro Abs 8.7 (*)    Lymphocytes Relative 9 (*)    Monocytes Absolute 1.2 (*)    All other components within normal limits  BASIC METABOLIC PANEL - Abnormal; Notable for the following:    Glucose, Bld 129 (*)    BUN 24 (*)    Creatinine, Ser 1.48 (*)    GFR calc non Af Amer 47 (*)    GFR calc Af Amer 55 (*)    All other components within normal limits  TROPONIN  I - Abnormal; Notable for the following:    Troponin I 0.16 (*)    All other components within normal limits  BRAIN NATRIURETIC PEPTIDE - Abnormal; Notable for the following:    B Natriuretic Peptide 1506.1 (*)    All other components within normal limits  I-STAT TROPOININ, ED - Abnormal; Notable for the following:    Troponin i, poc 0.13 (*)    All other components within normal limits  I-STAT CHEM 8, ED - Abnormal; Notable for the following:    BUN 29 (*)    Glucose, Bld 119 (*)    Calcium, Ion 1.09 (*)    All other components within normal limits  CULTURE, BLOOD (ROUTINE X 2)  CULTURE, BLOOD (  ROUTINE X 2)  MAGNESIUM  BASIC METABOLIC PANEL  I-STAT CG4 LACTIC ACID, ED    Imaging Review Dg Chest Portable 1 View  11/21/2014   CLINICAL DATA:  Chest tightness shortness of breath for 1 day. Possible history of atrial fibrillation.  EXAM: PORTABLE CHEST - 1 VIEW  COMPARISON:  11/18/2014  FINDINGS: Grossly unchanged enlarged cardiac silhouette and mediastinal contours. Developing airspace opacity within the right infrahilar lung. Pulmonary venous congestion without frank evidence of edema. Unchanged bibasilar heterogeneous opacities, left greater than right favored to represent atelectasis or scar. No pleural effusion pneumothorax. Grossly unchanged bones.  IMPRESSION: 1. Developing airspace opacity with the right infrahilar lung worrisome for infection. A follow-up chest radiograph in 4 to 6 weeks after treatment is recommended to ensure resolution. 2. Cardiomegaly and pulmonary venous congestion without frank evidence of edema.   Electronically Signed   By: Sandi Mariscal M.D.   On: 11/21/2014 10:47     EKG Interpretation   Date/Time:  Friday November 21 2014 09:58:00 EST Ventricular Rate:  46 PR Interval:  49 QRS Duration: 150 QT Interval:  673 QTC Calculation: 589 R Axis:   -154 Text Interpretation:  Sinus bradycardia Short PR interval Right bundle  branch block Probable anterior  infarct, old Abnormal T, consider ischemia,  lateral leads Artifact No previous ECGs available Confirmed by Domanik Rainville   MD, Carolie Mcilrath [phone]) on 11/21/2014 10:24:38 AM      MDM   Final diagnoses:  Complete heart block   Patient with 3 day history of left-sided chest pain with shortness of breath. Recent admission at outside hospital and diagnosed with atrial fibrillation. States pain is constant and unchanged. Worsening shortness of breath over the past several days.]  EKG with sinus bradycardia with prolonged QT. Subsequent EKG shows concern for complete heart block.  Mental status and blood pressure is stable.  Chest x-ray shows possible airspace opacity in the right lung. Patient with recent hospitalization will be treated for HCAP.  D/w Cardiology. Patient will be taken to cath lab for pacemaker.  BP and mental status remain stable.  Suspect infiltrate on CXR is likely edema and will hold antibiotics at this time.  Patient with no fever or significant cough.  CRITICAL CARE Performed by: Ezequiel Essex Total critical care time: 45 Critical care time was exclusive of separately billable procedures and treating other patients. Critical care was necessary to treat or prevent imminent or life-threatening deterioration. Critical care was time spent personally by me on the following activities: development of treatment plan with patient and/or surrogate as well as nursing, discussions with consultants, evaluation of patient's response to treatment, examination of patient, obtaining history from patient or surrogate, ordering and performing treatments and interventions, ordering and review of laboratory studies, ordering and review of radiographic studies, pulse oximetry and re-evaluation of patient's condition.    I personally performed the services described in this documentation, which was scribed in my presence. The recorded information has been reviewed and is accurate.   Ezequiel Essex, MD 11/21/14 [phone]

## 2014-11-21 NOTE — ED Notes (Signed)
Spoke with EDP asked about drawing blood culture before antibiotics. States will order blood cultures.

## 2014-11-21 NOTE — Telephone Encounter (Signed)
Spoke w/ pt's wife.  She reports that pt was in [facility] on Tues w/ HR 35-40, dx w/ atrial flutter.  She states that pt had another bad night last night, this am reports that he cannot breathe, feels that he has an anvil on his chest. She states that pt has not eaten since d/c and feels so weak this am that he can't move. She asks that I make Dr. Rockey Situ aware, as she states that she will either bring pt here to the ED and would like Dr. Donivan Scull recommendation.

## 2014-11-21 NOTE — Discharge Summary (Signed)
ELECTROPHYSIOLOGY PROCEDURE DISCHARGE SUMMARY    Patient ID: Matthew Howe,  MRN: [id number], DOB/AGE: [birth year] 69 y.o.  Admit date: 11/21/2014 Discharge date: 11/22/2014  Primary Care Physician: Adrian Prows, MD Primary Cardiologist: Rockey Situ Electrophysiologist: Orabelle Rylee  Primary Discharge Diagnosis:  Complete heart block status post pacemaker implantation this admission  Secondary Discharge Diagnosis:  1.  Hypertension 2.  Hyperlipidemia 3.  Parkinson's 4.  GERD  Allergies  Allergen Reactions  . Codeine   . Norco [Hydrocodone-Acetaminophen] Diarrhea and Nausea And Vomiting  . Omeprazole Diarrhea and Nausea And Vomiting     Procedures This Admission:  1.  Implantation of a STJ dual chamber PPM on 11-21-14 by Dr Rayann Heman.  The patient received a STJ model number Assurity PPM with model number 2088 right atrial lead and 1948 right ventricular lead. There were no immediate post procedure complications. 2.  CXR on 11-22-14  demonstrated no pneumothorax status post device implantation.  Brief HPI/Hospital Course:  Matthew Howe is a 69 y.o. male with a past medical history significant for hypertension, hyperlipidemia, GERD, and Parkinson's disease. He presented to [facility] on Tuesday of this week with chest pain and shortness of breath. He was found to be in heart block. His Amlodipine was discontinued with improvement in his conduction and he was discharged to home. Echocardiogram demonstrated normal EF, mildly dilated LA, mild AI. He then had recurrent chest pain and shortness of breath starting yesterday afternoon that progressively worsened. He called Dr Donivan Scull office this morning and was advised to come to [facility] for further evaluation. On arrival, he was found to be in complete heart block with ventricular rates in the 30-40's.  He was evaluated by Dr Rayann Heman who recommended pacemaker implantation with no reversible causes of heart block identified.   The patient  underwent implantation of a STJ dual chamber pacemaker with details as outlined above.  He  was monitored on telemetry overnight which demonstrated sinus rhythm with V pacing.  Left chest was without hematoma or ecchymosis.  The device was interrogated and found to be functioning normally.  CXR was obtained and demonstrated no pneumothorax status post device implantation. THere was a R middle lobe process which was felt to likely be atalectasis.  He had no clinical signs/ symptoms of pneumonia and therefore there was felt to be no indication for antibiotics.  Wound care, arm mobility, and restrictions were reviewed with the patient.  Dr Rayann Heman examined the patient and considered them stable for discharge to home.    Discharge Vitals: Blood pressure 149/83, pulse 66, temperature 98.2 F (36.8 C), temperature source Oral, resp. rate 18, height 6\' 1"  (1.854 m), weight 218 lb (98.884 kg), SpO2 92 %.  Physical Exam: Filed Vitals:   11/21/14 1850 11/21/14 2222 11/22/14 0133 11/22/14 0524  BP: 144/68 145/62 110/94 149/83  Pulse: 78 75 70 66  Temp:  98.6 F (37 C) 98.4 F (36.9 C) 98.2 F (36.8 C)  TempSrc:  Oral Oral Oral  Resp:  18 19 18   Height:      Weight:    218 lb (98.884 kg)  SpO2:  92% 90% 92%    GEN- The patient is well appearing, alert and oriented x 3 today.   Head- normocephalic, atraumatic Eyes-  Sclera clear, conjunctiva pink Ears- hearing intact Oropharynx- clear Neck- supple, Lungs- Clear to ausculation bilaterally, normal work of breathing Heart- Regular rate and rhythm  GI- soft, NT, ND, + BS Extremities- no clubbing, cyanosis, or edema, groin is  without hematoma/ bruit MS- no significant deformity or atrophy Skin- device pocket is without hematoma   Labs:   Lab Results  Component Value Date   WBC 10.9* 11/21/2014   HGB 15.3 11/21/2014   HCT 45.0 11/21/2014   MCV 90.5 11/21/2014   PLT 245 11/21/2014     Recent Labs Lab 11/22/14 0345  NA 137  K 4.2  CL 104   CO2 23  BUN 26*  CREATININE 1.31  CALCIUM 8.3*  GLUCOSE 94    Discharge Medications:    Medication List    TAKE these medications        amLODipine 10 MG tablet  Commonly known as:  NORVASC  Take 0.5 tablets (5 mg total) by mouth 2 (two) times daily.     aspirin 81 MG tablet  Take 81 mg by mouth daily.     dicyclomine 20 MG tablet  Commonly known as:  BENTYL  20 mg 4 (four) times daily.     fluticasone 50 MCG/ACT nasal spray  Commonly known as:  FLONASE  Place 2 sprays into the nose daily.     lactulose 10 GM/15ML solution  Commonly known as:  CHRONULAC  Take 20 g by mouth 2 (two) times daily as needed for mild constipation.     lisinopril 10 MG tablet  Commonly known as:  PRINIVIL,ZESTRIL  Take 1 tablet (10 mg total) by mouth daily.     magnesium hydroxide 400 MG/5ML suspension  Commonly known as:  MILK OF MAGNESIA  Take 30 mLs by mouth daily as needed for mild constipation.     selegiline 5 MG tablet  Commonly known as:  ELDEPRYL  Take 5 mg by mouth 2 (two) times daily with a meal.        Disposition:  DC to home Routine wound care and follow-up Follow-up Information    Follow up with [facility] On 12/03/2014.   Why:  4 pm   Contact information:   [place] [address]       Follow up with Ida Rogue, MD On 01/02/2015.   Specialty:  Cardiology   Why:  11 am   Contact information:   [place] [address] [phone]       Duration of Discharge Encounter: Greater than 30 minutes including physician time.  Signed,  Thompson Grayer MD

## 2014-11-22 ENCOUNTER — Encounter (HOSPITAL_COMMUNITY): Payer: Self-pay | Admitting: Internal Medicine

## 2014-11-22 ENCOUNTER — Inpatient Hospital Stay (HOSPITAL_COMMUNITY): Payer: Medicare Other

## 2014-11-22 LAB — BASIC METABOLIC PANEL
ANION GAP: 10 (ref 5–15)
BUN: 26 mg/dL — ABNORMAL HIGH (ref 6–23)
CALCIUM: 8.3 mg/dL — AB (ref 8.4–10.5)
CHLORIDE: 104 mmol/L (ref 96–112)
CO2: 23 mmol/L (ref 19–32)
Creatinine, Ser: 1.31 mg/dL (ref 0.50–1.35)
GFR calc Af Amer: 63 mL/min — ABNORMAL LOW (ref >90)
GFR calc non Af Amer: 55 mL/min — ABNORMAL LOW (ref >90)
Glucose, Bld: 94 mg/dL (ref 70–99)
POTASSIUM: 4.2 mmol/L (ref 3.5–5.1)
Sodium: 137 mmol/L (ref 135–145)

## 2014-11-22 NOTE — Discharge Instructions (Signed)
° ° °  Supplemental Discharge Instructions for  Pacemaker Patients  Activity No heavy lifting or vigorous activity with your left/right arm for 6 to 8 weeks.  Do not raise your left/right arm above your head for one week.  Gradually raise your affected arm as drawn below.            11/24/14                        11/25/14                      11/26/14                 11/27/14  NO DRIVING for  1 week   ; you may begin driving on   01/26/61  .  WOUND CARE - Keep the wound area clean and dry.  Do not get this area wet for one week. No showers for one week; you may shower on  11/29/14  . - The tape/steri-strips on your wound will fall off; do not pull them off.  No bandage is needed on the site.  DO  NOT apply any creams, oils, or ointments to the wound area. - If you notice any drainage or discharge from the wound, any swelling or bruising at the site, or you develop a fever > 101? F after you are discharged home, call the office at once.  Special Instructions - You are still able to use cellular telephones; use the ear opposite the side where you have your pacemaker/defibrillator.  Avoid carrying your cellular phone near your device. - When traveling through airports, show security personnel your identification card to avoid being screened in the metal detectors.  Ask the security personnel to use the hand wand. - Avoid arc welding equipment, MRI testing (magnetic resonance imaging), TENS units (transcutaneous nerve stimulators).  Call the office for questions about other devices. - Avoid electrical appliances that are in poor condition or are not properly grounded. - Microwave ovens are safe to be near or to operate.

## 2014-11-26 ENCOUNTER — Telehealth: Payer: Self-pay | Admitting: Cardiovascular Disease

## 2014-11-26 DIAGNOSIS — K5900 Constipation, unspecified: Secondary | ICD-10-CM | POA: Diagnosis not present

## 2014-11-26 DIAGNOSIS — R140 Abdominal distension (gaseous): Secondary | ICD-10-CM | POA: Diagnosis not present

## 2014-11-26 NOTE — Telephone Encounter (Signed)
Patient says he was recently in hospital and needs to discuss medication dosage/regimen with [facility].  Please call patient.

## 2014-11-26 NOTE — Telephone Encounter (Signed)
Per discharge instructions, pt needs f/u appt w/ Dr. Rockey Situ in 1-2 weeks.  Can you set this up for him?

## 2014-11-27 ENCOUNTER — Ambulatory Visit: Payer: Self-pay | Admitting: Urgent Care

## 2014-11-27 DIAGNOSIS — K5909 Other constipation: Secondary | ICD-10-CM | POA: Diagnosis not present

## 2014-11-27 LAB — CULTURE, BLOOD (ROUTINE X 2)
CULTURE: NO GROWTH
Culture: NO GROWTH

## 2014-12-01 ENCOUNTER — Other Ambulatory Visit: Payer: Self-pay | Admitting: Internal Medicine

## 2014-12-01 ENCOUNTER — Encounter: Payer: Self-pay | Admitting: Cardiovascular Disease

## 2014-12-01 ENCOUNTER — Ambulatory Visit (INDEPENDENT_AMBULATORY_CARE_PROVIDER_SITE_OTHER): Payer: Medicare Other | Admitting: Cardiovascular Disease

## 2014-12-01 VITALS — BP 142/78 | HR 67 | Ht 73.0 in | Wt 225.0 lb

## 2014-12-01 DIAGNOSIS — R109 Unspecified abdominal pain: Secondary | ICD-10-CM | POA: Diagnosis not present

## 2014-12-01 DIAGNOSIS — I442 Atrioventricular block, complete: Secondary | ICD-10-CM

## 2014-12-01 DIAGNOSIS — I10 Essential (primary) hypertension: Secondary | ICD-10-CM | POA: Diagnosis not present

## 2014-12-01 DIAGNOSIS — I2510 Atherosclerotic heart disease of native coronary artery without angina pectoris: Secondary | ICD-10-CM | POA: Diagnosis not present

## 2014-12-01 DIAGNOSIS — E785 Hyperlipidemia, unspecified: Secondary | ICD-10-CM

## 2014-12-01 DIAGNOSIS — I2583 Coronary atherosclerosis due to lipid rich plaque: Principal | ICD-10-CM

## 2014-12-01 MED ORDER — AMLODIPINE BESYLATE 5 MG PO TABS: 5.0000 mg | ORAL_TABLET | Freq: Every day | ORAL | Status: DC

## 2014-12-01 MED ORDER — LISINOPRIL 20 MG PO TABS: 20.0000 mg | ORAL_TABLET | Freq: Every day | ORAL | Status: DC

## 2014-12-01 NOTE — Assessment & Plan Note (Signed)
Currently with no symptoms of angina. No further workup at this time. Continue current medication regimen. 

## 2014-12-01 NOTE — Assessment & Plan Note (Signed)
Blood pressure is well controlled on today's visit. No changes made to the medications. 

## 2014-12-01 NOTE — Patient Instructions (Signed)
You are doing well.  Please stay on lisinopril 20 mg once a day Restart amlodipine 5 mg daily Monitor your blood pressure  Please call us if you have new issues that need to be addressed before your next appt.  Your physician wants you to follow-up in: 6 months.  You will receive a reminder letter in the mail two months in advance. If you don't receive a letter, please call our office to schedule the follow-up appointment.

## 2014-12-01 NOTE — Assessment & Plan Note (Signed)
Currently not on a statin. He does report cholesterol around 150.

## 2014-12-01 NOTE — Progress Notes (Signed)
Patient ID: Matthew Howe, male    DOB: [birth year], 69 y.o.   MRN: [id number]  HPI Comments: Matthew Howe is a very pleasant 69 year old gentleman with diagnosis of Parkinson's who currently works delivering car parts, history of hypertension, murmur, recent cardiac catheterization showing mild CAD after stress test at outpatient facility showed lateral wall hypokinesis on stress echo, hyperlipidemia who presents for routine followup after pacemaker placement. He presented to [facility] with bradycardia. This seemed to improve without intervention and he is asymptomatic. He had recurrent symptoms and we referred him to [facility] for further evaluation. He was seen in the emergency room and found to be in heart block, pacemaker placed urgently  He was in the hospital from 11/19/2014 with discharge from her 13th 2016.  Overall he feels well with no complaints. Recently started on new medications for constipation after being seen by GI. He reports this is helping his symptoms. Now going to bathroom on his own Interrogation of his pacemaker today shows he had 2 runs of atrial fibrillation lasting 6 hours on 11/22/2014, no arrhythmia since that time Incision site is healing, no significant pain  EKG on today's visit shows normal sinus rhythm with rate 67 bpm, ventricularly paced rhythm  Other past medical history Previous Stress echo leading to cardiac catheterization at [facility].  Stress echo was a limited study, ejection fraction estimated at 45% with EF of 50% at peak stress.  Cardiac catheterization 11/20/2012 with 25% proximal LAD disease, followed by 40% lesion. The cardiac catheterization note makes mention of atrial flutter though arrhythmia is not mentioned in any of the clinic notes. He does have a tremor from Parkinson's which is likely causing EKG artifact  Recent Echocardiogram shows ejection fraction 50-55%, mild to moderate aortic valve insufficiency, otherwise normal study.  Abdominal CT  12/22/2010 showed acute appendicitis, colonic diverticulosis    Allergies  Allergen Reactions  . Codeine   . Norco [Hydrocodone-Acetaminophen] Diarrhea and Nausea And Vomiting  . Omeprazole Diarrhea and Nausea And Vomiting    Outpatient Encounter Prescriptions as of 12/01/2014  Medication Sig  . aspirin 81 MG tablet Take 81 mg by mouth daily.  [name] dicyclomine (BENTYL) 20 MG tablet 20 mg 4 (four) times daily.   . fluticasone (FLONASE) 50 MCG/ACT nasal spray Place 2 sprays into the nose daily.  [name] lisinopril (PRINIVIL,ZESTRIL) 10 MG tablet Take 1 tablet (10 mg total) by mouth daily.  . magnesium hydroxide (MILK OF MAGNESIA) 400 MG/5ML suspension Take 30 mLs by mouth daily as needed for mild constipation.  . selegiline (ELDEPRYL) 5 MG tablet Take 5 mg by mouth 2 (two) times daily with a meal.   . XIFAXAN 550 MG TABS tablet Take 550 mg by mouth 3 (three) times daily.  . [DISCONTINUED] amLODipine (NORVASC) 10 MG tablet Take 0.5 tablets (5 mg total) by mouth 2 (two) times daily. (Patient not taking: Reported on 12/01/2014)  . [DISCONTINUED] lactulose (CHRONULAC) 10 GM/15ML solution Take 20 g by mouth 2 (two) times daily as needed for mild constipation.    Past Medical History  Diagnosis Date  . Hyperlipidemia   . Hypertension   . GERD (gastroesophageal reflux disease)   . Coronary artery disease   . Parkinson's disease   . Complete heart block     a. s/p St. Jude PPM 11/2014.    Past Surgical History  Procedure Laterality Date  . Colonoscopy    . Upper gi endoscopy    . Cardiac catheterization  11/20/2012    Known  CAD with one vessel coronary disease of left descending artery by cardiac cath.   . Appendectomy  12/2010  . Pacemaker insertion  11-21-14    STJ Assurity dual chamber pacemaker implanted by Dr Rayann Heman for CHB  . Permanent pacemaker insertion N/A 11/21/2014    Procedure: PERMANENT PACEMAKER INSERTION;  Surgeon: Thompson Grayer, MD;  Location: [facility] CATH LAB;  Service: Cardiovascular;   Laterality: N/A;  . Insert / replace / remove pacemaker      Social History  reports that he has never smoked. He does not have any smokeless tobacco history on file. He reports that he does not drink alcohol or use illicit drugs.  Family History family history includes Heart disease in his mother.   Review of Systems  Constitutional: Negative.   Respiratory: Negative.   Cardiovascular: Negative.   Gastrointestinal: Negative.   Musculoskeletal: Negative.   Skin: Negative.   Neurological: Negative.   Hematological: Negative.   Psychiatric/Behavioral: Negative.   All other systems reviewed and are negative.   BP 142/78 mmHg  Pulse 67  Ht 6\' 1"  (1.854 m)  Wt 225 lb (102.059 kg)  BMI 29.69 kg/m2  Physical Exam  Constitutional: He is oriented to person, place, and time. He appears well-developed and well-nourished.  HENT:  Head: Normocephalic.  Nose: Nose normal.  Mouth/Throat: Oropharynx is clear and moist.  Eyes: Conjunctivae are normal. Pupils are equal, round, and reactive to light.  Neck: Normal range of motion. Neck supple. No JVD present.  Cardiovascular: Normal rate, regular rhythm, S1 normal, S2 normal and intact distal pulses.  Exam reveals no gallop and no friction rub.   Murmur heard.  Decrescendo systolic murmur is present with a grade of 2/6  Pulmonary/Chest: Effort normal and breath sounds normal. No respiratory distress. He has no wheezes. He has no rales. He exhibits no tenderness.  Abdominal: Soft. Bowel sounds are normal. He exhibits no distension. There is no tenderness.  Musculoskeletal: Normal range of motion. He exhibits no edema or tenderness.  Lymphadenopathy:    He has no cervical adenopathy.  Neurological: He is alert and oriented to person, place, and time. Coordination normal.  Skin: Skin is warm and dry. No rash noted. No erythema.  Psychiatric: He has a normal mood and affect. His behavior is normal. Judgment and thought content normal.       Assessment and Plan   Nursing note and vitals reviewed.

## 2014-12-01 NOTE — Assessment & Plan Note (Signed)
Pacemaker interrogated. Functioning well. 2 runs of atrial fibrillation lasting 6 hours on 11/22/2014. Will monitor for now

## 2014-12-01 NOTE — Progress Notes (Signed)
Wound check today with Dr Rockey Situ.  Device pocket well healed without hematoma.  Edges well approximated.   Device interrogated and found to be functioning normally.  No programming changes made today.  Device interrogation reveals 2 episodes of atrial fibrillation, both on 11/22/14.  None since.  Pt's Merlin box is hooked up and transmitting.  D/W Dr Rockey Situ - will plan to follow AF burden remotely and initiate anticoagulation if recurrence.   This patients CHA2DS2-VASc Score and unadjusted Ischemic Stroke Rate (% per year) is equal to 2.2 % stroke rate/year from a score of 2 Above score calculated as 1 point each if present [CHF, HTN, DM, Vascular=MI/PAD/Aortic Plaque, Age if 65-74, or Male] Above score calculated as 2 points each if present [Age > 75, or Stroke/TIA/TE]   [employer] [facility] 12/01/2014 3:04 PM

## 2014-12-01 NOTE — Assessment & Plan Note (Addendum)
Recently seen by GI, started on medications for motility. Already starting to feel better. Recommended he keep in touch with their office

## 2014-12-03 ENCOUNTER — Ambulatory Visit: Payer: Medicare Other

## 2014-12-05 ENCOUNTER — Ambulatory Visit: Payer: Medicare Other

## 2014-12-06 LAB — MDC_IDC_ENUM_SESS_TYPE_INCLINIC
Battery Voltage: 2.99 V
Brady Statistic RA Percent Paced: 23 %
Brady Statistic RV Percent Paced: 99 %
Implantable Pulse Generator Model: 2240
Implantable Pulse Generator Serial Number: 3049931
Lead Channel Impedance Value: 530 Ohm
Lead Channel Impedance Value: 640 Ohm
Lead Channel Pacing Threshold Amplitude: 0.5 V
Lead Channel Pacing Threshold Amplitude: 1.25 V
Lead Channel Pacing Threshold Pulse Width: 0.4 ms
Lead Channel Pacing Threshold Pulse Width: 0.4 ms
Lead Channel Sensing Intrinsic Amplitude: 1.3 mV
Lead Channel Sensing Intrinsic Amplitude: 12 mV
Lead Channel Setting Pacing Amplitude: 3.5 V
Lead Channel Setting Pacing Amplitude: 3.5 V
Lead Channel Setting Pacing Pulse Width: 0.4 ms

## 2014-12-10 DIAGNOSIS — G20 Parkinson's disease: Secondary | ICD-10-CM | POA: Diagnosis not present

## 2014-12-10 DIAGNOSIS — K219 Gastro-esophageal reflux disease without esophagitis: Secondary | ICD-10-CM | POA: Diagnosis not present

## 2014-12-10 DIAGNOSIS — I2510 Atherosclerotic heart disease of native coronary artery without angina pectoris: Secondary | ICD-10-CM | POA: Diagnosis not present

## 2014-12-10 DIAGNOSIS — I10 Essential (primary) hypertension: Secondary | ICD-10-CM | POA: Diagnosis not present

## 2014-12-11 ENCOUNTER — Encounter: Payer: Self-pay | Admitting: Internal Medicine

## 2014-12-17 DIAGNOSIS — G20 Parkinson's disease: Secondary | ICD-10-CM | POA: Diagnosis not present

## 2014-12-17 DIAGNOSIS — K219 Gastro-esophageal reflux disease without esophagitis: Secondary | ICD-10-CM | POA: Diagnosis not present

## 2014-12-17 DIAGNOSIS — I10 Essential (primary) hypertension: Secondary | ICD-10-CM | POA: Diagnosis not present

## 2014-12-17 DIAGNOSIS — I2510 Atherosclerotic heart disease of native coronary artery without angina pectoris: Secondary | ICD-10-CM | POA: Diagnosis not present

## 2014-12-24 DIAGNOSIS — R140 Abdominal distension (gaseous): Secondary | ICD-10-CM | POA: Diagnosis not present

## 2014-12-24 DIAGNOSIS — R634 Abnormal weight loss: Secondary | ICD-10-CM | POA: Diagnosis not present

## 2014-12-24 DIAGNOSIS — K5900 Constipation, unspecified: Secondary | ICD-10-CM | POA: Diagnosis not present

## 2014-12-29 ENCOUNTER — Ambulatory Visit: Payer: Self-pay | Admitting: Urgent Care

## 2014-12-29 DIAGNOSIS — R1030 Lower abdominal pain, unspecified: Secondary | ICD-10-CM | POA: Diagnosis not present

## 2014-12-29 DIAGNOSIS — K5900 Constipation, unspecified: Secondary | ICD-10-CM | POA: Diagnosis not present

## 2014-12-30 ENCOUNTER — Encounter: Payer: Self-pay | Admitting: Internal Medicine

## 2015-01-02 ENCOUNTER — Ambulatory Visit: Payer: Medicare Other | Admitting: Cardiovascular Disease

## 2015-01-11 ENCOUNTER — Encounter: Payer: Self-pay | Admitting: Internal Medicine

## 2015-01-16 ENCOUNTER — Telehealth: Payer: Self-pay

## 2015-01-16 MED ORDER — APIXABAN 5 MG PO TABS: 5.0000 mg | ORAL_TABLET | Freq: Two times a day (BID) | ORAL | Status: DC

## 2015-01-16 NOTE — Telephone Encounter (Signed)
Spoke w/ pt's wife.   She questions if pt's pacer is working correctly.  Reviewed Dr. Donivan Scull recommendation of stopping asa and starting Eliquis.  She states that she is trying to anticipate her husband's questions when she relays the message.  She has several questions about the purpose of the pacer and about what afib is.  Answered her questions, but she states that she may have her husband call back to go over everything again.  Encouraged her to have pt keep upcoming appt w/ Dr. Caryl Comes.

## 2015-01-16 NOTE — Telephone Encounter (Signed)
-----   Message from Minna Merritts, MD sent at 01/15/2015 10:44 PM EDT ----- Regarding: FW: patient w/ afib Needs to stop asa, Start eliquis 5 mg twice a day May need appt to discuss tim  ----- Message -----    From: Thompson Grayer, MD    Sent: 01/12/2015  10:53 PM      To: Minna Merritts, MD Subject: patient w/ afib                                Per remote transmisssion, Mr Fabiano continues to have afib.  He has had 10 episodes since 12/16/14.  Longest was 20 hours and 37 minutes.  Overall burden is 4.6 %  You might want to stop ASA and start anticoagulation.  I will defer to you.  V rates appear controlled.

## 2015-01-16 NOTE — Telephone Encounter (Signed)
Spoke w/ pt's wife.   Advised her of Dr. Donivan Scull recommendation.  She verbalizes understanding and will come by to day to p/u samples of Eliquis 5 mg.

## 2015-01-16 NOTE — Telephone Encounter (Signed)
Pt wife is calling , has some questions regarding pt pacemaker and his afib

## 2015-01-23 ENCOUNTER — Encounter: Payer: Self-pay | Admitting: *Deleted

## 2015-01-23 ENCOUNTER — Telehealth: Payer: Self-pay | Admitting: Internal Medicine

## 2015-01-23 NOTE — Telephone Encounter (Signed)
New message         Pt would like to know if he can wear a back pack

## 2015-01-23 NOTE — Telephone Encounter (Signed)
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 He purchased another unit today instead of wearing his harness pack. Pt aware to stay prudent with heavy physical strain until approx. 60mo after implant.   Pt aware ROV w/ Dr. Caryl Comes [facility]) 02/24/15.

## 2015-01-23 NOTE — Telephone Encounter (Signed)
Pt called to find out if he can carry four gallons of water in a back pack. Pt had a pacemaker implant on 11/21/14. Pt is aware that  it may not be recommended, because the strip would be over the pacemaker and of the heaviness of the backpack.

## 2015-02-08 NOTE — Consult Note (Signed)
General Aspect Primary Cardiologist: Dr. Rockey Situ, MD _________________  69 year old male with history of CAD treated medically (mention of possible atrial flutter during cardiac cath in 11/2012), HTN, HLD, Parkinson's disease, and GERD who presented to [facility] on 11/18/2014 with a 4-5 day history of intermittent chest pain and SOB, he was found to be in atrial flutter with ventricular rates in the 40's. ________________  PMH: 1. CAD treated medically (mention of possible atrial flutter during cardiac cath in 11/2012) 2. HTN 3. HLD 4. Parkinson's disease 5. GERD _________________   Present Illness 69 year old male with the above problem list who presented to [facility] on 2/09 with a 4-5 day history of intermittent chest pain and SOB.  He was found to be in atrial flutter with ventricular rates in the 40s. Patient with known CAD s/p cardiac catheterization in 11/2012 that showed 25% stenosis of the pLAD. In a second lesion there was 40% stenosis. No cardiac interventions were performed at that time. It was recommended to continue medication management of his risk factors. It was also noted that he had atrial flutter during the cardiac cath. There was no mention of this in any of his notes. In subsequent follow ups the patient remained in NSR. He remained relatively active at home and was without symptoms at his last follow up in July. He was not on a statin at that time 2/2 progressive leg weakness. Echo 04/2014 showed EF 50-55%, no RMWA, mild to moderate aortic regurgitation, left atrium 45 mm. Abdominal CT 12/22/2010 showed acute appendicitis, colonic diverticulosis.  He presented to [facility] on 2/9 with a now 4-5 day history of intermittent ABD pain and SOB. Symptoms were not exertional in nature and were occuring at rest. There was also associated diaphoresis on 2/9. No associated nausea, vomiting, presyncope, or syncope. He also noted some associated apalpitations on 2/9, he has never noticed these previously. He  noted while he was at work on 2/9 he did not feel quite like himself, felt more weak, though this was a typical day. When he got home to tried to eat some soup, but he could not as his stomach was bothering him quite a bit. This then led to progressive chest pain prompting him to come into [facility] for further evaluation. Weight has been stable. No orthopnea. No LEE. He continued to have chest pain until around 1-2 AM this morning. He has been chest pain free since.   Upon his arrival at [facility] he was noted to be in atrial flutter with ventricular rates in the 40s (4:1 conduction). Troponin 0.03-->0.09-->0.20. CXR with mild bibasilar atelectasis or scarring and borderline cardiomegaly. Echo is pending. He is currently in NSR with HR in the 60s. He is currently chest pain free.   Physical Exam:  GEN well developed, no acute distress   HEENT hearing intact to voice   NECK supple   RESP normal resp effort  clear BS   CARD Regular rate and rhythm  Murmur   Murmur Diastolic   ABD denies tenderness  soft   LYMPH negative neck   EXTR negative edema   SKIN normal to palpation   NEURO motor/sensory function intact   PSYCH alert, A+O to time, place, person, good insight   Review of Systems:  Subjective/Chief Complaint ABD pain, gas, constipation   General: Fatigue   Skin: No Complaints   ENT: No Complaints   Eyes: No Complaints   Neck: No Complaints   Respiratory: Short of breath  Cardiovascular: Chest pain or discomfort  Palpitations   Gastrointestinal: No Complaints   Genitourinary: No Complaints   Vascular: No Complaints   Musculoskeletal: No Complaints   Neurologic: No Complaints   Hematologic: No Complaints   Endocrine: No Complaints   Psychiatric: No Complaints   Review of Systems: All other systems were reviewed and found to be negative   Medications/Allergies Reviewed Medications/Allergies reviewed   Family & Social History:  Family and Social History:   Family History Coronary Artery Disease  mother CAD   Social History negative tobacco, negative ETOH, negative Illicit drugs   Place of Living Home     Parkinson's:    gerd:    hyperlipidemia:    hypertension:    appendectomy:        Admit Diagnosis:   SYMPTOMATIC BRADYCARDIA: Onset Date: 19-Nov-2014, Status: Active, Description: SYMPTOMATIC BRADYCARDIA  Home Medications: Medication Instructions Status  amLODIPine 10 mg oral tablet 0.5 tab(s) orally once a day at lunch time Active  lisinopril 10 mg oral tablet 1 tab(s) orally once a day (at bedtime) Active  dicyclomine 20 mg oral tablet 1 tab(s) orally 3 times a day Active  selegiline 5 mg oral tablet 1 tab(s) orally 2 times a day Active   Lab Results:  Routine Chem:  10-Feb-16 03:05   Result Comment TROPONIN - RESULTS VERIFIED BY REPEAT TESTING.  - PREV. C/ 11-18-14 _0  BY [email or web address].[email or web address] KitchenAJO  Result(s) reported on 19 Nov 2014 at 04:12AM.  Cholesterol, Serum 141  Triglycerides, Serum 47  HDL (INHOUSE) 42  VLDL Cholesterol Calculated 9  LDL Cholesterol Calculated 90 (Result(s) reported on 19 Nov 2014 at 03:45AM.)  Glucose, Serum 96  BUN 17  Creatinine (comp) 1.02  Sodium, Serum 142  Potassium, Serum 4.1  Chloride, Serum  109  CO2, Serum 26  Calcium (Total), Serum  8.1  Anion Gap 7  Osmolality (calc) 285  eGFR (African American) >60  eGFR (Non-African American) >60 (eGFR values <58m/min/1.73 m2 may be an indication of chronic kidney disease (CKD). Calculated eGFR, using the MRDR Study equation, is useful in  patients with stable renal function. The eGFR calculation will not be reliable in acutely ill patients when serum creatinine is changing rapidly. It is not useful in patients on dialysis. The eGFR calculation may not be applicable to patients at the low and high extremes of body sizes, pregnant women, and vegetarians.)  Cardiac:  09-Feb-16 22:40   Troponin I  0.09 (0.00-0.05 0.05 ng/mL or less: NEGATIVE  Repeat  testing in 3-6 hrs  if clinically indicated. >0.05 ng/mL: POTENTIAL  MYOCARDIAL INJURY. Repeat  testing in 3-6 hrs if  clinically indicated. NOTE: An increase or decrease  of 30% or more on serial  testing suggests a  clinically important change)  CPK-MB, Serum 2.0 (Result(s) reported on 18 Nov 2014 at 11:23PM.)  10-Feb-16 03:05   Troponin I  0.20 (0.00-0.05 0.05 ng/mL or less: NEGATIVE  Repeat testing in 3-6 hrs  if clinically indicated. >0.05 ng/mL: POTENTIAL  MYOCARDIAL INJURY. Repeat  testing in 3-6 hrs if  clinically indicated. NOTE: An increase or decrease  of 30% or more on serial  testing suggests a  clinically important change)  CPK-MB, Serum 2.4 (Result(s) reported on 19 Nov 2014 at 04:00AM.)  Routine Hem:  10-Feb-16 03:05   WBC (CBC) 9.2  RBC (CBC) 4.65  Hemoglobin (CBC) 14.1  Hematocrit (CBC) 41.5  Platelet Count (CBC) 217  MCV 89  MCH 30.3  MCHC 33.9  RDW 13.5  Neutrophil %  69.0  Lymphocyte % 18.7  Monocyte % 10.4  Eosinophil % 1.0  Basophil % 0.9  Neutrophil # 6.4  Lymphocyte # 1.7  Monocyte # 1.0  Eosinophil # 0.1  Basophil # 0.1 (Result(s) reported on 19 Nov 2014 at 03:31AM.)   EKG:  EKG Interp. by me   Interpretation EKG on 2/9: atrial flutter, 41 bpm, RBBB. 2/10: NSR, 65 bpm, RBBB, 1st degree AVB, bifascicular block, TWI I, II, III, V2-V6   Radiology Results: XRay:    09-Feb-16 19:20, Chest Portable Single View  Chest Portable Single View   REASON FOR EXAM:    sob  COMMENTS:       PROCEDURE: DXR - DXR PORTABLE CHEST SINGLE VIEW  - Nov 18 2014  7:20PM     CLINICAL DATA:  shortness of breath. Patient tachypneic in triage.  Has h/o Parkinson's. Wife states shortness of breath started a  couple of days ago. HR 42 in triage, which wife states she doesn't  believe is his normal. hx of HTN.    EXAM:  PORTABLE CHEST - 1 VIEW    COMPARISON:  None available  FINDINGS:  Heart size upper limits normal for technique. Linear scarring  or  subsegmental atelectasis laterally at the left lung base. Coarse  interstitial opacities at the right lung base. Lungs otherwise  clear. No pneumothorax.No effusion.  Degenerative changes in bilateral AC joints. Spurring in the mid  thoracic spine.     IMPRESSION:  1. Mild bibasilar atelectasis or scarring.  2. Borderline cardiomegaly.      Electronically Signed    By: Lucrezia Europe M.D.    On: 11/18/2014 19:25         Verified By: Kandis Cocking, M.D.,    09-Feb-16 19:45, Abdomen Flat and Erect  Abdomen Flat and Erect   REASON FOR EXAM:    abd discomfort & distention, not passing flatus,   concerned for ileus/SBO  COMMENTS:   Bedside (portable):Y    PROCEDURE: DXR - DXR ABDOMEN 2 V FLAT AND ERECT  - Nov 18 2014  7:45PM     CLINICAL DATA:  Lower abdominal pain with distention and  constipation.    EXAM:  ABDOMEN - 2 VIEW    COMPARISON:  Abdominal CT 03/22/2013    FINDINGS:  The bowel gas pattern is normal. There is no evidence of free air.  No radio-opaque calculi or other significant radiographic  abnormality isseen. Lung bases are grossly clear.     IMPRESSION:  Negative.  No evidence of bowel obstruction or perforation.      Electronically Signed    By: Monte Fantasia M.D.    On: 11/18/2014 19:56         Verified By: Gilford Silvius, M.D.,    Codeine: N/V/Diarrhea  Vicodin: N/V/Diarrhea  Vital Signs/Nurse's Notes: **Vital Signs.:   10-Feb-16 07:52  Vital Signs Type Routine  Temperature Temperature (F) 98.1  Celsius 36.7  Pulse Pulse 61  Respirations Respirations 16  Systolic BP Systolic BP 250  Diastolic BP (mmHg) Diastolic BP (mmHg) 74  Mean BP 97  Pulse Ox % Pulse Ox % 96  Pulse Ox Activity Level  At rest  Oxygen Delivery Room Air/ 21 %    Impression 70 year old male with history of CAD treated medically (mention of possible atrial flutter during cardiac cath in 11/2012), HTN, HLD, Parkinson's disease, and GERD who presented to [facility] on  11/18/2014 with a 4-5 day history of intermittent  chest pain and SOB, he was found to be in atrial flutter with ventricular rates in the 40's. Biggest issue per the patient is his bowels, constipation and gas  1. Elevated troponin: demand ischemia in teh setting of arrhythmia will d/c heparin and IVF -Echo pending to evaluate LV function and wall motion   2. Atrial flutter: Likely secondary to stress of GI pain, constipation -Currently in NSR with HR in the 60s to 70s -CHADSVASc 2, giving him an estimated annual risk of stroke at 2.2%  3. CAD: -Has been off statin 2/2 leg weakness, he is open to restarting this for primary prevention -Lipitor 40 mg  restart b-blocker  4. HTN: -Improving restart home meds  5. History of acute renal injury: -Resolved   Electronic Signatures: Rise Mu (PA-C)  (Signed 10-Feb-16 09:51)  Authored: General Aspect/Present Illness, History and Physical Exam, Review of System, Family & Social History, Past Medical History, Home Medications, Labs, EKG , Radiology, Allergies, Vital Signs/Nurse's Notes, Impression/Plan Ida Rogue (MD)  (Signed 10-Feb-16 10:17)  Authored: General Aspect/Present Illness, History and Physical Exam, Review of System, Family & Social History, Health Issues, Labs, EKG , Impression/Plan  Co-Signer: General Aspect/Present Illness, Home Medications, Allergies   Last Updated: 10-Feb-16 10:17 by Ida Rogue (MD)

## 2015-02-08 NOTE — H&P (Signed)
PATIENT NAME:  Matthew Howe, DENK MR#:  [id number] DATE OF BIRTH:  12-10-1945  DATE OF ADMISSION:  11/18/2014  REFERRING PHYSICIAN:  Yetta Numbers. Karma Greaser, MD  PRIMARY CARE PHYSICIAN:  Cheral Marker. Ola Spurr, MD, at [facility].   CHIEF COMPLAINT:  Chest pain.   HISTORY OF PRESENT ILLNESS:  This is a 69 year old Caucasian gentleman with history of Parkinson's, gastroesophageal reflux disease without esophagitis, and essential hypertension, presenting with chest pain. He describes a 4-to 5-day duration of chest pain waxing and waning in timing, retrosternal in location, pressure in quality, 7 to 8 out of 10 in intensity with associated dyspnea on exertion. Symptoms worsened today at rest, thus presented to the hospital for further workup and evaluation. He denies any further symptomatology in the Emergency Department. He is noted to be in atrial flutter with 4:1 heart block and heart rate in the 40s. Chest pain has improved after aspirin and nitroglycerin.   REVIEW OF SYSTEMS: CONSTITUTIONAL:  Denies fevers, chills, fatigue, or weakness.  EYES:  Denies blurred vision, double vision, or eye pain.  EARS, NOSE, AND THROAT:  Denies tinnitus, ear pain, or hearing loss.  RESPIRATORY:  Denies cough or wheeze. Positive for shortness of breath.  CARDIOVASCULAR:  Positive for chest pain as described above; however, denies any orthopnea, edema, or palpitations.  GASTROINTESTINAL:  Denies nausea, vomiting, or diarrhea. Positive for constipation.  GENITOURINARY:  Denies dysuria or hematuria.  ENDOCRINE:  Denies nocturia or thyroid problems.  HEMATOLOGIC AND LYMPHATIC:  Denies easy bruising or bleeding.  SKIN:  Denies rashes or lesions.  MUSCULOSKELETAL:  Denies pain in the neck, back, shoulders, knees, or hips or arthritic symptoms.  NEUROLOGIC:  Denies paralysis or paresthesias.  PSYCHIATRIC:  Denies anxiety or depressive symptoms.   Otherwise, full review of systems performed by me is negative.   PAST MEDICAL  HISTORY:  Parkinson's affecting mainly the right upper extremity as well as GI symptomatology; gastroesophageal reflux disease without esophagitis; hyperlipidemia, unspecified; hypertension, essential.   SOCIAL HISTORY:  Denies any alcohol, tobacco, or drug usage.   FAMILY HISTORY:  Positive for coronary artery disease and hypertension, as well as colon cancer.   ALLERGIES:  CODEINE AND VICODIN.   HOME MEDICATIONS:  Include lisinopril 10 mg p.o. at bedtime, selegiline 5 mg p.o. b.i.d., Norvasc 10 mg 1/2 tablet p.o. daily, dicyclomine 20 mg p.o. 3 times daily.   PHYSICAL EXAMINATION: VITAL SIGNS:  Temperature is 98, heart rate 42, respirations 22, blood pressure 154/61, saturating 99% on room air. Weight is 104.3 kg, BMI 31.2.  GENERAL:  Well-nourished, well-developed, pleasant Caucasian gentleman, currently in no acute distress.  HEAD:  Normocephalic, atraumatic.  EYES:  Pupils are equal, round, and reactive to light. Extraocular muscles are intact. No scleral icterus.  MOUTH:  Moist mucosal membranes. Dentition is intact. No abscess noted.  EARS, NOSE, AND THROAT:  Clear without exudates. No external lesions.  NECK:  Supple. No thyromegaly. No nodules. No JVD.  PULMONARY:  Clear to auscultation bilaterally without wheezes, rales, or rhonchi. No use of accessory muscles. Good respiratory effort.  CHEST:  Nontender to palpation.  CARDIOVASCULAR:  S1 and S2, bradycardic. No murmurs, rubs, or gallops. Pedal pulses 2+ bilaterally.  GASTROINTESTINAL:  Soft, nontender, nondistended. No masses. Positive bowel sounds. No hepatosplenomegaly.  MUSCULOSKELETAL:  No swelling, clubbing, or edema. Range of motion is full in all extremities.  NEUROLOGIC:  Cranial nerves II through XII are intact. No gross focal neurological deficits. Sensation is intact. Reflexes are intact.  SKIN:  No ulceration, lesions, rashes, or cyanosis. Skin is warm and dry. Turgor is intact.  PSYCHIATRIC:  Mood and affect are  within normal limits. The patient is awake, alert, and oriented x 3. Insight and judgment are intact.   LABORATORY DATA:  EKG performed shows atrial flutter with 4:1 block as well as right bundle branch block.   Sodium is 137, potassium 4.7, chloride 104, bicarbonate 25, BUN 19, creatinine 1.33, glucose 127. LFTs are within normal limits. Troponin is 0.03. WBC is 15.9, hemoglobin 16.5, platelets 278,000.   Chest x-ray performed reveals mild bibasilar atelectasis.   ASSESSMENT AND PLAN:  This is a 69 year old Caucasian gentleman with history of Parkinson's, gastroesophageal reflux disease without esophagitis, and hypertension, essential, presenting with chest pain and found to be in atrial flutter with 4:1 block and heart rate in the 40s.   1.  Symptomatic bradycardia. We will place on telemetry. Trend cardiac enzymes x 3. Initiate aspirin and statin therapy. We will check a transthoracic echocardiogram and consult cardiology. He follows with [facility]. Avoid any further atrioventricular nodal agents. No indication for aspirin at this time; however, if required, we will give 0.5 mg intravenously. Suspect this is most likely related to a conduction delay given atrial flutter, as well as right bundle branch block. His CHADS score is 1 given hypertension. We will hold on anticoagulation for now.  2.  Acute kidney injury. Intravenous fluid hydration. Follow urine output and renal function.  3.  Hypertension, essential. Hold Norvasc and lisinopril given bradycardia and acute kidney injury.  4.  Gastroesophageal reflux disease. Proton pump inhibitor therapy.  5.  Venous thromboembolism prophylaxis with heparin subcutaneously.   CODE STATUS:  The patient is a full code.   TIME SPENT:  45 minutes.    ____________________________ Aaron Mose. Hower, MD dkh:nb D: 11/18/2014 20:57:06 ET T: 11/18/2014 21:56:30 ET JOB#: [id number]  cc: Aaron Mose. Hower, MD, <Dictator> DAVID Woodfin Ganja  MD ELECTRONICALLY SIGNED 11/20/2014 20:33

## 2015-02-08 NOTE — Discharge Summary (Signed)
PATIENT NAME:  Matthew Howe, Matthew Howe MR#:  [id number] DATE OF BIRTH:  16-Aug-1946  DATE OF ADMISSION:  11/18/2014 DATE OF DISCHARGE:  11/19/2014  PRIMARY CARE PHYSICIAN:  Cheral Marker. Ola Spurr, MD  CARDIOLOGIST:  Minna Merritts, MD  FINAL DIAGNOSES: 1.  Brief episode of atrial flutter with bradycardia.  2.  Constipation.  3.  Hypertension, essential.  4.  Gastroesophageal reflux disease without esophagitis.  5.  Parkinson disease.   MEDICATIONS ON DISCHARGE:  Include lisinopril 10 mg at bedtime, dicyclomine 20 mg 3 times a day, selegiline 5 mg twice a day, lactulose 30 mg twice a day as needed for constipation, MiraLax 17 grams once a day, aspirin 81 mg daily. Stop taking amlodipine because this can cause constipation.   DIET:  Low sodium, regular consistency. Stay hydrated.  ACTIVITY:  As tolerated.   FOLLOWUP:  With Dr. Rockey Situ in 2 weeks, Dr. Allen Norris of gastroenterology, and in 1 to 2 weeks with Dr. Ola Spurr.  HOSPITAL COURSE:  The patient was admitted 11/18/2014, and discharged 11/19/2014. The patient came in with chest pain and shortness of breath. The patient was admitted with symptomatic bradycardia with transient atrial flutter. The patient was seen in consultation by Dr. Rockey Situ of cardiology.   Laboratory and radiological data during the hospital course included EKG showing atrial flutter and right bundle branch block. TSH was 1.71. Liver function tests were in the normal range. Glucose was 127, BUN 19, creatinine 1.33, sodium 137, potassium 4.7, chloride 104, CO2 of 25, and calcium 8.9. Troponin was negative. White blood cell count was 15.9, hemoglobin and hematocrit 16.5 and 49.5, and platelet count 278,000. Chest x-ray showed borderline cardiomegaly, mild bibasilar atelectasis and/or scarring. Flattened upright was negative with no evidence of bowel obstruction or perforation. Troponins were borderline at 0.09 and 0.20. LDL was 90, HDL 42, and triglycerides 47. White blood cell count upon  discharge was 9.2, hemoglobin 14.1. Creatinine was 1.02. Echocardiogram showed EF of 60 to 65%.   HOSPITAL COURSE PER PROBLEM LIST:  1.  For brief episode of atrial flutter with bradycardia, the patient converted to normal sinus rhythm. The patient was seen in consultation by Dr. Rockey Situ, who recommended followup as outpatient. He was okay with low-dose aspirin for anticoagulation at this point.  2.  The patient's main complaint was constipation. He was started on lactulose and MiraLax. Recommend MiraLax on a daily basis and p.r.n. for the lactulose.  3.  Hypertension. I stopped the amlodipine. This can cause constipation. He can continue lisinopril.  4.  Gastroesophageal reflux disease without esophagitis, not on any medication for this.  5.  Parkinson disease, on selegiline for this.   TIME SPENT ON DISCHARGE:  35 minutes.    ____________________________ Tana Conch. Leslye Peer, MD rjw:nb D: 11/19/2014 14:27:53 ET T: 11/19/2014 22:09:41 ET JOB#: [id number]  cc: Tana Conch. Leslye Peer, MD, <Dictator> Cheral Marker. Ola Spurr, MD Minna Merritts, MD  Marisue Brooklyn MD ELECTRONICALLY SIGNED 11/20/2014 14:00

## 2015-02-10 NOTE — Telephone Encounter (Signed)
This encounter was created in error - please disregard.

## 2015-02-24 ENCOUNTER — Encounter: Payer: Self-pay | Admitting: Internal Medicine

## 2015-02-24 ENCOUNTER — Ambulatory Visit (INDEPENDENT_AMBULATORY_CARE_PROVIDER_SITE_OTHER): Payer: Medicare Other | Admitting: Internal Medicine

## 2015-02-24 VITALS — BP 144/74 | HR 60 | Ht 73.0 in | Wt 222.5 lb

## 2015-02-24 DIAGNOSIS — I2510 Atherosclerotic heart disease of native coronary artery without angina pectoris: Secondary | ICD-10-CM

## 2015-02-24 DIAGNOSIS — I442 Atrioventricular block, complete: Secondary | ICD-10-CM

## 2015-02-24 DIAGNOSIS — Z950 Presence of cardiac pacemaker: Secondary | ICD-10-CM | POA: Diagnosis not present

## 2015-02-24 LAB — CUP PACEART INCLINIC DEVICE CHECK
Battery Voltage: 3.02 V
Brady Statistic RA Percent Paced: 8 %
Brady Statistic RV Percent Paced: 49 %
Lead Channel Pacing Threshold Amplitude: 0.75 V
Lead Channel Pacing Threshold Amplitude: 0.75 V
Lead Channel Pacing Threshold Pulse Width: 0.4 ms
Lead Channel Pacing Threshold Pulse Width: 0.4 ms
Lead Channel Pacing Threshold Pulse Width: 0.4 ms
Lead Channel Setting Pacing Amplitude: 1.375
Lead Channel Setting Pacing Pulse Width: 0.4 ms
Lead Channel Setting Sensing Sensitivity: 4 mV
MDC IDC MSMT BATTERY REMAINING LONGEVITY: 133.2 mo
MDC IDC MSMT LEADCHNL RA IMPEDANCE VALUE: 575 Ohm
MDC IDC MSMT LEADCHNL RA SENSING INTR AMPL: 3.6 mV
MDC IDC MSMT LEADCHNL RV IMPEDANCE VALUE: 625 Ohm
MDC IDC MSMT LEADCHNL RV PACING THRESHOLD AMPLITUDE: 1.125 V
MDC IDC MSMT LEADCHNL RV SENSING INTR AMPL: 12 mV
MDC IDC PG SERIAL: 3049931
MDC IDC SESS DTM: 20160517113708
MDC IDC SET LEADCHNL RA PACING AMPLITUDE: 2 V
Pulse Gen Model: 2240

## 2015-02-24 NOTE — Progress Notes (Signed)
Patient Care Team: Adrian Prows, MD as PCP - General (Infectious Diseases)   HPI  Matthew Howe is a 69 y.o. male Seen following pacemaker implantation 2/16 by Dr. Greggory Brandy . He had presented to [facility] with chest pain and shortness of breath. He was found to be in complete heart block. He had an antecedent history of bifascicular block   Echocardiogram had demonstrated normal left ventricular function. Catheterization 2/14 had demonstrated mild nonobstructive disease.  He also has Parkinson's and these things combined to make energy and exercise tolerance a challenge. He has been doing better over these last days. He does not have a history of orthostatic lightheadedness.  Past Medical History  Diagnosis Date  . Hyperlipidemia   . Hypertension   . GERD (gastroesophageal reflux disease)   . Coronary artery disease   . Parkinson's disease   . Complete heart block     a. s/p St. Jude PPM 11/2014.    Past Surgical History  Procedure Laterality Date  . Colonoscopy    . Upper gi endoscopy    . Cardiac catheterization  11/20/2012    Known CAD with one vessel coronary disease of left descending artery by cardiac cath.   . Appendectomy  12/2010  . Pacemaker insertion  11-21-14    STJ Assurity dual chamber pacemaker implanted by Dr Rayann Heman for CHB  . Permanent pacemaker insertion N/A 11/21/2014    Procedure: PERMANENT PACEMAKER INSERTION;  Surgeon: Thompson Grayer, MD;  Location: [facility] CATH LAB;  Service: Cardiovascular;  Laterality: N/A;  . Insert / replace / remove pacemaker      Current Outpatient Prescriptions  Medication Sig Dispense Refill  . amLODipine (NORVASC) 5 MG tablet Take 1 tablet (5 mg total) by mouth daily. 90 tablet 3  . apixaban (ELIQUIS) 5 MG TABS tablet Take 1 tablet (5 mg total) by mouth 2 (two) times daily. 60 tablet 6  . chlorpheniramine-HYDROcodone (TUSSIONEX) 10-8 MG/5ML SUER Take 5 mLs by mouth as needed for cough.    . desloratadine (CLARINEX) 5 MG tablet Take 5  mg by mouth daily.    [name] dicyclomine (BENTYL) 20 MG tablet 20 mg 4 (four) times daily.     . fluticasone (FLONASE) 50 MCG/ACT nasal spray Place 2 sprays into the nose daily.    . lansoprazole (PREVACID) 15 MG capsule Take 15 mg by mouth as needed.    [name] lisinopril (PRINIVIL,ZESTRIL) 20 MG tablet Take 1 tablet (20 mg total) by mouth daily. 30 tablet 11  . lubiprostone (AMITIZA) 24 MCG capsule Take 24 mcg by mouth 2 (two) times daily with a meal.    . magnesium hydroxide (MILK OF MAGNESIA) 400 MG/5ML suspension Take 30 mLs by mouth daily as needed for mild constipation.    . Probiotic Product (ALIGN) 4 MG CAPS Take by mouth daily.    . selegiline (ELDEPRYL) 5 MG tablet Take 5 mg by mouth 2 (two) times daily with a meal.     . simethicone (MYLICON) 80 MG chewable tablet Takes 3 tablets by mouth daily.     No current facility-administered medications for this visit.    Allergies  Allergen Reactions  . Codeine   . Norco [Hydrocodone-Acetaminophen] Diarrhea and Nausea And Vomiting  . Omeprazole Diarrhea and Nausea And Vomiting    Review of Systems negative except from HPI and PMH  Physical Exam BP 144/74 mmHg  Pulse 60  Ht 6\' 1"  (1.854 m)  Wt 222 lb 8 oz (100.925 kg)  BMI 29.36 kg/m2 Well developed and well nourished in no acute distress HENT normal E scleral and icterus clear Neck Supple JVP flat; carotids brisk and full Clear to ausculation Device pocket well healed; without hematoma or erythema.  There is no tethering Regular rate and rhythm, no murmurs gallops or rub Soft with active bowel sounds No clubbing cyanosis  Edema Alert and oriented, grossy normal motor and sensory function Skin Warm and Dry  lECG demonstrated sinus rhythm at 60 Intervals 18/15/43 with right bundle branch block and a northwest axis at -163  Assessment and  Plan  Intermittent complete heart block  Pacemaker-St. Jude  Parkinson's disease  DOE  Hypertension  His blood pressure is mildly  elevated; we will leave it where it is as I worry little bit about orthostatic intolerance.   His device was reprogrammed to activate VIP  to minimize ventricular pacing  and decreased outputs to maximize longevity  Encouraged him to try and modulate his activity so as not to exhaust himself periodically  Overall he is pretty stable

## 2015-02-24 NOTE — Patient Instructions (Addendum)
Medication Instructions:  Your physician recommends that you continue on your current medications as directed. Please refer to the Current Medication list given to you today.   Labwork: None  Testing/Procedures: Remote monitoring is used to monitor your Pacemaker of ICD from home. This monitoring reduces the number of office visits required to check your device to one time per year. It allows [place] to keep an eye on the functioning of your device to ensure it is working properly. You are scheduled for a device check from home May 26, 2015. You may send your transmission at any time that day. If you have a wireless device, the transmission will be sent automatically. After your physician reviews your transmission, you will receive a postcard with your next transmission date.    Follow-Up: Your physician wants you to follow-up in: 74 MONTHS with Dr. Gari Crown will receive a reminder letter in the mail two months in advance. If you don't receive a letter, please call our office to schedule the follow-up appointment.   Any Other Special Instructions Will Be Listed Below (If Applicable).

## 2015-03-27 ENCOUNTER — Telehealth: Payer: Self-pay | Admitting: *Deleted

## 2015-03-27 ENCOUNTER — Encounter: Payer: Self-pay | Admitting: Internal Medicine

## 2015-03-27 NOTE — Telephone Encounter (Signed)
S/w pt who indicates he's been experiencing headache, dizziness and short of breath for a couple of weeks. States it feels better at night.  States no new medications added and nothing different in his daily activities. Pt feels it is from Eliquis and would like to know if there's anything else he can take in place of it.  Stated to patient that he needs to continue to take Eliquis and will forward to Dr. Rockey Situ for review.  Pt verbalized understanding

## 2015-03-27 NOTE — Telephone Encounter (Signed)
His symptoms could be from paroxysmal atrial fibrillation Less likely from the blood thinner as this typically will cause only bruising or bleeding. Would stay on some kind of blood thinner Most of the new agents are expensive, including others like xarelto and pradaxa. Would ask his pharmacist about the other 2 Cheapest option is warfarin but more cumbersome as it requires frequent lab draws and monitoring

## 2015-03-27 NOTE — Telephone Encounter (Signed)
Pt c/o medication issue:  1. Name of Medication: Eliquis  2. How are you currently taking this medication (dosage and times per day)? Yes   3. Are you having a reaction (difficulty breathing--STAT)?  Little bit but may not be because of this, just wants to know what is wrong  4. What is your medication issue? dizziness and headache. This is been going on for a while, it just is getting more worst.  Wants to also know if there is something he can take over the counter

## 2015-03-27 NOTE — Telephone Encounter (Signed)
S/w patient regarding Dr. Donivan Scull recommendations. Pt states he will continue Eliquis for a bit longer and will call pharmacist regarding cost of xarelto and pradaxa and will notify us if he wants to switch to one of these. Pt had no further questions.

## 2015-04-08 ENCOUNTER — Telehealth: Payer: Self-pay | Admitting: Urgent Care

## 2015-04-08 MED ORDER — LUBIPROSTONE 24 MCG PO CAPS: 24.0000 ug | ORAL_CAPSULE | Freq: Two times a day (BID) | ORAL | Status: DC

## 2015-04-08 NOTE — Telephone Encounter (Signed)
Called Pharmacy at this time to explain that prescription for Amitiza was sent over with 11 refills in May 2016.   Staff member states that they never received this prescription.  Will resend at this time.

## 2015-04-08 NOTE — Telephone Encounter (Signed)
Returned phone call to patient and explained that refill was sent to [facility] in [place]. Asked her to call if any problems with script.

## 2015-04-08 NOTE — Telephone Encounter (Signed)
Patient needs a refill of his Amitiza, please.

## 2015-04-30 DIAGNOSIS — R251 Tremor, unspecified: Secondary | ICD-10-CM | POA: Diagnosis not present

## 2015-04-30 DIAGNOSIS — G20 Parkinson's disease: Secondary | ICD-10-CM | POA: Diagnosis not present

## 2015-05-18 ENCOUNTER — Telehealth: Payer: Self-pay | Admitting: *Deleted

## 2015-05-18 NOTE — Telephone Encounter (Signed)
Last ECHO 04/09/13.

## 2015-05-18 NOTE — Telephone Encounter (Signed)
Does this patient need a echo?  He is on my recall list but he recently had a pacermaker put in. Thanks

## 2015-05-18 NOTE — Telephone Encounter (Signed)
I do not think he needs an echo unless there has been new symptoms  developing

## 2015-05-26 ENCOUNTER — Ambulatory Visit (INDEPENDENT_AMBULATORY_CARE_PROVIDER_SITE_OTHER): Payer: Medicare Other | Admitting: *Deleted

## 2015-05-26 ENCOUNTER — Encounter: Payer: Self-pay | Admitting: Internal Medicine

## 2015-05-26 DIAGNOSIS — I442 Atrioventricular block, complete: Secondary | ICD-10-CM

## 2015-05-26 NOTE — Progress Notes (Signed)
Remote PPM transmission 

## 2015-06-01 ENCOUNTER — Ambulatory Visit (INDEPENDENT_AMBULATORY_CARE_PROVIDER_SITE_OTHER): Payer: Medicare Other | Admitting: Cardiovascular Disease

## 2015-06-01 ENCOUNTER — Encounter: Payer: Self-pay | Admitting: Cardiovascular Disease

## 2015-06-01 VITALS — BP 148/76 | HR 70 | Ht 73.0 in | Wt 225.5 lb

## 2015-06-01 DIAGNOSIS — R5382 Chronic fatigue, unspecified: Secondary | ICD-10-CM

## 2015-06-01 DIAGNOSIS — I2510 Atherosclerotic heart disease of native coronary artery without angina pectoris: Secondary | ICD-10-CM

## 2015-06-01 DIAGNOSIS — G20 Parkinson's disease: Secondary | ICD-10-CM

## 2015-06-01 DIAGNOSIS — I442 Atrioventricular block, complete: Secondary | ICD-10-CM | POA: Diagnosis not present

## 2015-06-01 DIAGNOSIS — I2583 Coronary atherosclerosis due to lipid rich plaque: Principal | ICD-10-CM

## 2015-06-01 DIAGNOSIS — R5383 Other fatigue: Secondary | ICD-10-CM | POA: Insufficient documentation

## 2015-06-01 DIAGNOSIS — I10 Essential (primary) hypertension: Secondary | ICD-10-CM | POA: Diagnosis not present

## 2015-06-01 DIAGNOSIS — I340 Nonrheumatic mitral (valve) insufficiency: Secondary | ICD-10-CM | POA: Insufficient documentation

## 2015-06-01 DIAGNOSIS — E785 Hyperlipidemia, unspecified: Secondary | ICD-10-CM

## 2015-06-01 NOTE — Patient Instructions (Signed)
You are doing well. No medication changes were made.  Please research CT coronary calcium score (on google images)  Please call us if you have new issues that need to be addressed before your next appt.  Your physician wants you to follow-up in: 6 months.  You will receive a reminder letter in the mail two months in advance. If you don't receive a letter, please call our office to schedule the follow-up appointment.

## 2015-06-01 NOTE — Assessment & Plan Note (Signed)
Mild to moderate MR on prior echocardiogram Asymptomatic

## 2015-06-01 NOTE — Progress Notes (Signed)
Patient ID: Matthew Howe, male    DOB: 06/14/1946, 69 y.o.   MRN: [id number]  HPI Comments: Matthew Howe is a very pleasant 69 year old gentleman with diagnosis of Parkinson's who currently works delivering car parts, history of hypertension,  cardiac catheterization showing mild CAD after stress test at outpatient facility showed lateral wall hypokinesis on stress echo, hyperlipidemia who presents for routine followup after pacemaker placement. Found to have atrial fibrillation 11/22/2014 on pacemaker download, started on anticoagulation  prior cardiac catheterization February 2014 with nonobstructive disease  In follow-up today, he leads a busy lifestyle, continues to work most days of the week. Reports having significant fatigue, joint stiffness in the morning. Tremor seems to come and go, very good today. Followed by neurology, Dr. Melrose Nakayama Denies any symptoms of shortness of breath or chest pain Constipation issues seem to be resolved  EKG on today's visit shows ventricularly paced rhythm with rate 70 bpm  Other past medical history He presented to [facility] with bradycardia February 2016. This seemed to improve without intervention and he is asymptomatic. He had recurrent symptoms and we referred him to [facility] for further evaluation. He was seen in the emergency room and found to be in heart block, pacemaker placed urgently   Previous Stress echo leading to cardiac catheterization at [facility].  Stress echo was a limited study, ejection fraction estimated at 45% with EF of 50% at peak stress.  Cardiac catheterization 11/20/2012 with 25% proximal LAD disease, followed by 40% lesion. The cardiac catheterization note makes mention of atrial flutter though arrhythmia is not mentioned in any of the clinic notes.  Previous Echocardiogram shows ejection fraction 50-55%, mild to moderate aortic valve insufficiency, otherwise normal study.  Abdominal CT 12/22/2010 showed acute appendicitis, colonic  diverticulosis    Allergies  Allergen Reactions  . Codeine   . Norco [Hydrocodone-Acetaminophen] Diarrhea and Nausea And Vomiting  . Omeprazole Diarrhea and Nausea And Vomiting    Outpatient Encounter Prescriptions as of 06/01/2015  Medication Sig  . amLODipine (NORVASC) 5 MG tablet Take 1 tablet (5 mg total) by mouth daily.  [name] apixaban (ELIQUIS) 5 MG TABS tablet Take 1 tablet (5 mg total) by mouth 2 (two) times daily.  . chlorpheniramine-HYDROcodone (TUSSIONEX) 10-8 MG/5ML SUER Take 5 mLs by mouth as needed for cough.  . desloratadine (CLARINEX) 5 MG tablet Take 5 mg by mouth daily.  [name] dicyclomine (BENTYL) 20 MG tablet 20 mg 4 (four) times daily.   . fluticasone (FLONASE) 50 MCG/ACT nasal spray Place 2 sprays into the nose daily.  . lansoprazole (PREVACID) 15 MG capsule Take 15 mg by mouth as needed.  [name] lisinopril (PRINIVIL,ZESTRIL) 20 MG tablet Take 1 tablet (20 mg total) by mouth daily.  [name] lubiprostone (AMITIZA) 24 MCG capsule Take 1 capsule (24 mcg total) by mouth 2 (two) times daily with a meal.  . magnesium hydroxide (MILK OF MAGNESIA) 400 MG/5ML suspension Take 30 mLs by mouth daily as needed for mild constipation.  . Probiotic Product (ALIGN) 4 MG CAPS Take by mouth daily.  . selegiline (ELDEPRYL) 5 MG tablet Take 5 mg by mouth 2 (two) times daily with a meal.   . simethicone (MYLICON) 80 MG chewable tablet Takes 3 tablets by mouth daily.   No facility-administered encounter medications on file as of 06/01/2015.    Past Medical History  Diagnosis Date  . Hyperlipidemia   . Hypertension   . GERD (gastroesophageal reflux disease)   . Coronary artery disease   . Parkinson's disease   .  Complete heart block     a. s/p St. Jude PPM 11/2014.    Past Surgical History  Procedure Laterality Date  . Colonoscopy    . Upper gi endoscopy    . Cardiac catheterization  11/20/2012    Known CAD with one vessel coronary disease of left descending artery by cardiac cath.   .  Appendectomy  12/2010  . Pacemaker insertion  11-21-14    STJ Assurity dual chamber pacemaker implanted by Dr Rayann Heman for CHB  . Permanent pacemaker insertion N/A 11/21/2014    Procedure: PERMANENT PACEMAKER INSERTION;  Surgeon: Thompson Grayer, MD;  Location: [facility] CATH LAB;  Service: Cardiovascular;  Laterality: N/A;  . Insert / replace / remove pacemaker      Social History  reports that he has never smoked. He does not have any smokeless tobacco history on file. He reports that he does not drink alcohol or use illicit drugs.  Family History family history includes Heart disease in his mother.   Review of Systems  Constitutional: Negative.   Respiratory: Negative.   Cardiovascular: Negative.   Gastrointestinal: Negative.   Musculoskeletal: Negative.   Skin: Negative.   Neurological: Negative.   Hematological: Negative.   Psychiatric/Behavioral: Negative.   All other systems reviewed and are negative.   BP 148/76 mmHg  Pulse 70  Ht 6\' 1"  (1.854 m)  Wt 225 lb 8 oz (102.286 kg)  BMI 29.76 kg/m2  Physical Exam  Constitutional: He is oriented to person, place, and time. He appears well-developed and well-nourished.  HENT:  Head: Normocephalic.  Nose: Nose normal.  Mouth/Throat: Oropharynx is clear and moist.  Eyes: Conjunctivae are normal. Pupils are equal, round, and reactive to light.  Neck: Normal range of motion. Neck supple. No JVD present.  Cardiovascular: Normal rate, regular rhythm, S1 normal, S2 normal and intact distal pulses.  Exam reveals no gallop and no friction rub.   Murmur heard.  Decrescendo systolic murmur is present with a grade of 2/6  Pulmonary/Chest: Effort normal and breath sounds normal. No respiratory distress. He has no wheezes. He has no rales. He exhibits no tenderness.  Abdominal: Soft. Bowel sounds are normal. He exhibits no distension. There is no tenderness.  Musculoskeletal: Normal range of motion. He exhibits no edema or tenderness.   Lymphadenopathy:    He has no cervical adenopathy.  Neurological: He is alert and oriented to person, place, and time. Coordination normal.  Skin: Skin is warm and dry. No rash noted. No erythema.  Psychiatric: He has a normal mood and affect. His behavior is normal. Judgment and thought content normal.      Assessment and Plan   Nursing note and vitals reviewed.

## 2015-06-01 NOTE — Assessment & Plan Note (Signed)
Cholesterol numbers discussed with him today. He prefers not to be on a cholesterol medication. Does not want more pills. We did discuss CT coronary calcium scoring. This may be one way to track his coronary disease if done on a periodic basis

## 2015-06-01 NOTE — Assessment & Plan Note (Signed)
He reports symptoms are stable. Minimal tremor, still very active at baseline

## 2015-06-01 NOTE — Assessment & Plan Note (Signed)
Recommended he talk with Dr. Ola Spurr. May benefit from TSH and testosterone check given his significant fatigue symptoms.

## 2015-06-01 NOTE — Assessment & Plan Note (Signed)
Currently with no symptoms of angina. No further workup at this time. Continue current medication regimen. 

## 2015-06-01 NOTE — Assessment & Plan Note (Signed)
Status post pacemaker, followed by Dr. Caryl Comes

## 2015-06-01 NOTE — Assessment & Plan Note (Signed)
Blood pressure is well controlled on today's visit. No changes made to the medications. 

## 2015-06-03 LAB — CUP PACEART REMOTE DEVICE CHECK
Battery Remaining Longevity: 122 mo
Battery Remaining Percentage: 95.5 %
Battery Voltage: 3.01 V
Brady Statistic RA Percent Paced: 13 %
Brady Statistic RV Percent Paced: 44 %
Date Time Interrogation Session: 20160816060021
Lead Channel Impedance Value: 430 Ohm
Lead Channel Pacing Threshold Amplitude: 0.75 V
Lead Channel Pacing Threshold Pulse Width: 0.4 ms
Lead Channel Pacing Threshold Pulse Width: 0.4 ms
Lead Channel Sensing Intrinsic Amplitude: 12 mV
Lead Channel Sensing Intrinsic Amplitude: 2.9 mV
Lead Channel Setting Pacing Amplitude: 1.75 V
Lead Channel Setting Sensing Sensitivity: 4 mV
MDC IDC MSMT LEADCHNL RV IMPEDANCE VALUE: 560 Ohm
MDC IDC MSMT LEADCHNL RV PACING THRESHOLD AMPLITUDE: 1.5 V
MDC IDC SET LEADCHNL RA PACING AMPLITUDE: 2 V
MDC IDC SET LEADCHNL RV PACING PULSEWIDTH: 0.4 ms
MDC IDC STAT BRADY AP VP PERCENT: 12 %
MDC IDC STAT BRADY AP VS PERCENT: 1.5 %
MDC IDC STAT BRADY AS VP PERCENT: 32 %
MDC IDC STAT BRADY AS VS PERCENT: 54 %
Pulse Gen Model: 2240
Pulse Gen Serial Number: 3049931

## 2015-06-16 ENCOUNTER — Telehealth: Payer: Self-pay

## 2015-06-16 DIAGNOSIS — K5900 Constipation, unspecified: Secondary | ICD-10-CM

## 2015-06-16 MED ORDER — LINACLOTIDE 145 MCG PO CAPS: 145.0000 ug | ORAL_CAPSULE | Freq: Every day | ORAL | Status: DC

## 2015-06-16 NOTE — Telephone Encounter (Signed)
Yes please start at the lower dose for 30 days once a day with 3 refills.

## 2015-06-16 NOTE — Telephone Encounter (Signed)
Called patient to let him know that per Dr. Allen Norris, it was okay for him to start taking Linzess 45 MG, 1 tab daily with 3 refills. I also told patient to stop taking Amitiza and start Linzess. I told patient that I would send his prescription to [facility] per patient's request. I told him that if he had further questions or continued to have constipation after trying Linzess, to give [place] a call. Patient understood.

## 2015-06-16 NOTE — Telephone Encounter (Signed)
Patient called stating that he has been having constipation for the past three weeks. 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 Patient stated that Vickey Huger had mentioned Linzess and would like to know if its okay for you to prescribe him some so he could have regular bowel movements. Please advice.

## 2015-06-17 DIAGNOSIS — R251 Tremor, unspecified: Secondary | ICD-10-CM | POA: Diagnosis not present

## 2015-06-17 DIAGNOSIS — I2510 Atherosclerotic heart disease of native coronary artery without angina pectoris: Secondary | ICD-10-CM | POA: Diagnosis not present

## 2015-06-17 DIAGNOSIS — G20 Parkinson's disease: Secondary | ICD-10-CM | POA: Diagnosis not present

## 2015-06-17 DIAGNOSIS — I10 Essential (primary) hypertension: Secondary | ICD-10-CM | POA: Diagnosis not present

## 2015-06-29 DIAGNOSIS — H4323 Crystalline deposits in vitreous body, bilateral: Secondary | ICD-10-CM | POA: Diagnosis not present

## 2015-06-29 DIAGNOSIS — H2513 Age-related nuclear cataract, bilateral: Secondary | ICD-10-CM | POA: Diagnosis not present

## 2015-06-30 DIAGNOSIS — I2510 Atherosclerotic heart disease of native coronary artery without angina pectoris: Secondary | ICD-10-CM | POA: Diagnosis not present

## 2015-06-30 DIAGNOSIS — E780 Pure hypercholesterolemia: Secondary | ICD-10-CM | POA: Diagnosis not present

## 2015-06-30 DIAGNOSIS — J0110 Acute frontal sinusitis, unspecified: Secondary | ICD-10-CM | POA: Diagnosis not present

## 2015-06-30 DIAGNOSIS — I10 Essential (primary) hypertension: Secondary | ICD-10-CM | POA: Diagnosis not present

## 2015-07-28 ENCOUNTER — Emergency Department: Payer: Medicare Other

## 2015-07-28 ENCOUNTER — Inpatient Hospital Stay
Admission: EM | Admit: 2015-07-28 | Discharge: 2015-07-29 | DRG: 291 | Disposition: A | Discharge status: Home / Self Care | Payer: Medicare Other | Attending: Internal Medicine | Admitting: Internal Medicine

## 2015-07-28 ENCOUNTER — Inpatient Hospital Stay (HOSPITAL_COMMUNITY)
Admit: 2015-07-28 | Discharge: 2015-07-28 | Disposition: A | Discharge status: Home / Self Care | Payer: Medicare Other | Attending: Internal Medicine | Admitting: Internal Medicine

## 2015-07-28 DIAGNOSIS — G20 Parkinson's disease: Secondary | ICD-10-CM | POA: Diagnosis not present

## 2015-07-28 DIAGNOSIS — I442 Atrioventricular block, complete: Secondary | ICD-10-CM | POA: Diagnosis present

## 2015-07-28 DIAGNOSIS — I480 Paroxysmal atrial fibrillation: Secondary | ICD-10-CM | POA: Diagnosis present

## 2015-07-28 DIAGNOSIS — J9601 Acute respiratory failure with hypoxia: Secondary | ICD-10-CM | POA: Diagnosis present

## 2015-07-28 DIAGNOSIS — R011 Cardiac murmur, unspecified: Secondary | ICD-10-CM | POA: Diagnosis present

## 2015-07-28 DIAGNOSIS — I10 Essential (primary) hypertension: Secondary | ICD-10-CM | POA: Diagnosis present

## 2015-07-28 DIAGNOSIS — I351 Nonrheumatic aortic (valve) insufficiency: Secondary | ICD-10-CM | POA: Diagnosis not present

## 2015-07-28 DIAGNOSIS — Z8249 Family history of ischemic heart disease and other diseases of the circulatory system: Secondary | ICD-10-CM

## 2015-07-28 DIAGNOSIS — Z823 Family history of stroke: Secondary | ICD-10-CM

## 2015-07-28 DIAGNOSIS — Z79899 Other long term (current) drug therapy: Secondary | ICD-10-CM

## 2015-07-28 DIAGNOSIS — I110 Hypertensive heart disease with heart failure: Secondary | ICD-10-CM | POA: Diagnosis present

## 2015-07-28 DIAGNOSIS — Z888 Allergy status to other drugs, medicaments and biological substances status: Secondary | ICD-10-CM

## 2015-07-28 DIAGNOSIS — Z7951 Long term (current) use of inhaled steroids: Secondary | ICD-10-CM

## 2015-07-28 DIAGNOSIS — I509 Heart failure, unspecified: Secondary | ICD-10-CM

## 2015-07-28 DIAGNOSIS — E785 Hyperlipidemia, unspecified: Secondary | ICD-10-CM | POA: Diagnosis present

## 2015-07-28 DIAGNOSIS — Z95810 Presence of automatic (implantable) cardiac defibrillator: Secondary | ICD-10-CM

## 2015-07-28 DIAGNOSIS — Z9049 Acquired absence of other specified parts of digestive tract: Secondary | ICD-10-CM

## 2015-07-28 DIAGNOSIS — Z9889 Other specified postprocedural states: Secondary | ICD-10-CM | POA: Diagnosis not present

## 2015-07-28 DIAGNOSIS — Z886 Allergy status to analgesic agent status: Secondary | ICD-10-CM

## 2015-07-28 DIAGNOSIS — I2510 Atherosclerotic heart disease of native coronary artery without angina pectoris: Secondary | ICD-10-CM | POA: Diagnosis present

## 2015-07-28 DIAGNOSIS — K219 Gastro-esophageal reflux disease without esophagitis: Secondary | ICD-10-CM | POA: Diagnosis present

## 2015-07-28 DIAGNOSIS — I5023 Acute on chronic systolic (congestive) heart failure: Secondary | ICD-10-CM | POA: Diagnosis not present

## 2015-07-28 DIAGNOSIS — I5033 Acute on chronic diastolic (congestive) heart failure: Principal | ICD-10-CM | POA: Insufficient documentation

## 2015-07-28 DIAGNOSIS — I459 Conduction disorder, unspecified: Secondary | ICD-10-CM | POA: Diagnosis not present

## 2015-07-28 DIAGNOSIS — Z800 Family history of malignant neoplasm of digestive organs: Secondary | ICD-10-CM | POA: Diagnosis not present

## 2015-07-28 DIAGNOSIS — G20A1 Parkinson's disease without dyskinesia, without mention of fluctuations: Secondary | ICD-10-CM | POA: Diagnosis present

## 2015-07-28 DIAGNOSIS — R0602 Shortness of breath: Secondary | ICD-10-CM | POA: Diagnosis not present

## 2015-07-28 HISTORY — DX: Heart failure, unspecified: I50.9

## 2015-07-28 HISTORY — DX: Cardiac murmur, unspecified: R01.1

## 2015-07-28 HISTORY — DX: Presence of cardiac pacemaker: Z95.0

## 2015-07-28 HISTORY — DX: Reserved for inherently not codable concepts without codable children: IMO0001

## 2015-07-28 LAB — BASIC METABOLIC PANEL
ANION GAP: 7 (ref 5–15)
BUN: 16 mg/dL (ref 6–20)
CHLORIDE: 108 mmol/L (ref 101–111)
CO2: 23 mmol/L (ref 22–32)
Calcium: 8.8 mg/dL — ABNORMAL LOW (ref 8.9–10.3)
Creatinine, Ser: 0.99 mg/dL (ref 0.61–1.24)
Glucose, Bld: 114 mg/dL — ABNORMAL HIGH (ref 65–99)
POTASSIUM: 3.9 mmol/L (ref 3.5–5.1)
SODIUM: 138 mmol/L (ref 135–145)

## 2015-07-28 LAB — CBC
HCT: 42.3 % (ref 40.0–52.0)
HEMOGLOBIN: 14.2 g/dL (ref 13.0–18.0)
MCH: 29.1 pg (ref 26.0–34.0)
MCHC: 33.6 g/dL (ref 32.0–36.0)
MCV: 86.5 fL (ref 80.0–100.0)
PLATELETS: 234 10*3/uL (ref 150–440)
RBC: 4.89 MIL/uL (ref 4.40–5.90)
RDW: 14.1 % (ref 11.5–14.5)
WBC: 7.4 10*3/uL (ref 3.8–10.6)

## 2015-07-28 LAB — CREATININE, SERUM
CREATININE: 1.12 mg/dL (ref 0.61–1.24)
GFR calc non Af Amer: >60 mL/min (ref >60)

## 2015-07-28 LAB — APTT: APTT: 32 s (ref 24–36)

## 2015-07-28 LAB — BRAIN NATRIURETIC PEPTIDE: B Natriuretic Peptide: 1247 pg/mL — ABNORMAL HIGH (ref 0.0–100.0)

## 2015-07-28 LAB — PROTIME-INR
INR: 1.2
PROTHROMBIN TIME: 15.4 s — AB (ref 11.4–15.0)

## 2015-07-28 LAB — TROPONIN I
Troponin I: <0.03 ng/mL (ref <0.031)
Troponin I: 0.03 ng/mL (ref <0.031)

## 2015-07-28 MED ORDER — SIMETHICONE 80 MG PO CHEW: 80.0000 mg | CHEWABLE_TABLET | Freq: Four times a day (QID) | ORAL | Status: DC | PRN

## 2015-07-28 MED ORDER — LISINOPRIL 20 MG PO TABS
20.0000 mg | ORAL_TABLET | Freq: Every day | ORAL | Status: DC
  Administered 2015-07-28: 20 mg via ORAL
  Filled 2015-07-28 (×2): qty 1

## 2015-07-28 MED ORDER — FLUTICASONE PROPIONATE 50 MCG/ACT NA SUSP
2.0000 | Freq: Every day | NASAL | Status: DC
  Administered 2015-07-28 – 2015-07-29 (×2): 2 via NASAL
  Filled 2015-07-28: qty 16

## 2015-07-28 MED ORDER — POTASSIUM CHLORIDE CRYS ER 20 MEQ PO TBCR
20.0000 meq | EXTENDED_RELEASE_TABLET | Freq: Every day | ORAL | Status: DC
  Administered 2015-07-28: 20 meq via ORAL
  Filled 2015-07-28 (×2): qty 1

## 2015-07-28 MED ORDER — FUROSEMIDE 10 MG/ML IJ SOLN: 40.0000 mg | Freq: Two times a day (BID) | INTRAMUSCULAR | Status: DC

## 2015-07-28 MED ORDER — AMLODIPINE BESYLATE 5 MG PO TABS
5.0000 mg | ORAL_TABLET | Freq: Every day | ORAL | Status: DC
  Administered 2015-07-28: 5 mg via ORAL
  Filled 2015-07-28: qty 1

## 2015-07-28 MED ORDER — PRAMIPEXOLE DIHYDROCHLORIDE 0.25 MG PO TABS
0.7500 mg | ORAL_TABLET | Freq: Every day | ORAL | Status: DC
  Administered 2015-07-28: 0.75 mg via ORAL
  Filled 2015-07-28: qty 3

## 2015-07-28 MED ORDER — CARVEDILOL 6.25 MG PO TABS
6.2500 mg | ORAL_TABLET | Freq: Two times a day (BID) | ORAL | Status: DC
  Administered 2015-07-28 – 2015-07-29 (×2): 6.25 mg via ORAL
  Filled 2015-07-28 (×2): qty 1

## 2015-07-28 MED ORDER — APIXABAN 5 MG PO TABS
5.0000 mg | ORAL_TABLET | Freq: Two times a day (BID) | ORAL | Status: DC
  Administered 2015-07-28 – 2015-07-29 (×3): 5 mg via ORAL
  Filled 2015-07-28 (×3): qty 1

## 2015-07-28 MED ORDER — HEPARIN SODIUM (PORCINE) 5000 UNIT/ML IJ SOLN
5000.0000 [IU] | Freq: Three times a day (TID) | INTRAMUSCULAR | Status: DC
Start: 2015-07-28 — End: 2015-07-28

## 2015-07-28 MED ORDER — PANTOPRAZOLE SODIUM 40 MG PO TBEC
40.0000 mg | DELAYED_RELEASE_TABLET | Freq: Every day | ORAL | Status: DC
Start: 2015-07-28 — End: 2015-07-29
  Administered 2015-07-28 – 2015-07-29 (×2): 40 mg via ORAL
  Filled 2015-07-28 (×2): qty 1

## 2015-07-28 MED ORDER — LINACLOTIDE 145 MCG PO CAPS
145.0000 ug | ORAL_CAPSULE | Freq: Every day | ORAL | Status: DC
  Administered 2015-07-28 – 2015-07-29 (×2): 145 ug via ORAL
  Filled 2015-07-28 (×2): qty 1

## 2015-07-28 MED ORDER — FUROSEMIDE 10 MG/ML IJ SOLN
40.0000 mg | Freq: Once | INTRAMUSCULAR | Status: AC
  Administered 2015-07-28: 40 mg via INTRAVENOUS
  Filled 2015-07-28: qty 4

## 2015-07-28 MED ORDER — SELEGILINE HCL 5 MG PO CAPS
5.0000 mg | ORAL_CAPSULE | Freq: Two times a day (BID) | ORAL | Status: DC
  Administered 2015-07-28 – 2015-07-29 (×2): 5 mg via ORAL
  Filled 2015-07-28 (×4): qty 1

## 2015-07-28 MED ORDER — MAGNESIUM HYDROXIDE 400 MG/5ML PO SUSP: 30.0000 mL | Freq: Every day | ORAL | Status: DC | PRN

## 2015-07-28 MED ORDER — DILTIAZEM HCL ER COATED BEADS 120 MG PO CP24
120.0000 mg | ORAL_CAPSULE | Freq: Every day | ORAL | Status: DC
  Administered 2015-07-28: 120 mg via ORAL
  Filled 2015-07-28: qty 1

## 2015-07-28 MED ORDER — SODIUM CHLORIDE 0.9 % IJ SOLN
3.0000 mL | Freq: Two times a day (BID) | INTRAMUSCULAR | Status: DC
  Administered 2015-07-28 – 2015-07-29 (×3): 3 mL via INTRAVENOUS

## 2015-07-28 MED ORDER — DICYCLOMINE HCL 20 MG PO TABS
20.0000 mg | ORAL_TABLET | Freq: Three times a day (TID) | ORAL | Status: DC
  Administered 2015-07-28 – 2015-07-29 (×5): 20 mg via ORAL
  Filled 2015-07-28 (×5): qty 1

## 2015-07-28 MED ORDER — LUBIPROSTONE 24 MCG PO CAPS: 24.0000 ug | ORAL_CAPSULE | Freq: Two times a day (BID) | ORAL | Status: DC

## 2015-07-28 MED ORDER — FUROSEMIDE 10 MG/ML IJ SOLN
40.0000 mg | Freq: Two times a day (BID) | INTRAMUSCULAR | Status: DC
  Administered 2015-07-28 – 2015-07-29 (×2): 40 mg via INTRAVENOUS
  Filled 2015-07-28 (×2): qty 4

## 2015-07-28 NOTE — ED Notes (Signed)
Voided 400 ml clear yel urine.

## 2015-07-28 NOTE — H&P (Addendum)
[facility] [place] NAME: Matthew Howe    MR#:  [id number]  DATE OF BIRTH:  [birth year]  DATE OF ADMISSION:  07/28/2015  PRIMARY CARE PHYSICIAN: Adrian Prows, MD   REQUESTING/REFERRING PHYSICIAN: Dr Mariea Clonts  CHIEF COMPLAINT:   Chief Complaint  Patient presents with  . Shortness of Breath  . Nasal Congestion    HISTORY OF PRESENT ILLNESS:  Matthew Howe  is a 69 y.o. male with a known history of coronary artery disease, complete heart block status post placement of St. Jude's AICD in 11/2014, Parkinson's disease and hypertension presents today with several days of worsening shortness of breath and increasing lower extremity edema. Last night he developed left-sided chest pressure and orthopnea. He denies diaphoresis nausea or presyncope. He has had a cough with some sinus type congestion no hemoptysis, no chills, has had subjective fever. On emergency room evaluation his chest x-ray shows mild interstitial edema and his BNP is elevated consistent with congestive heart failure exacerbation. He also became acutely hypoxic with O2 sat 86% on room air.   PAST MEDICAL HISTORY:   Past Medical History  Diagnosis Date  . Hyperlipidemia   . Hypertension   . GERD (gastroesophageal reflux disease)   . Coronary artery disease   . Parkinson's disease ([place])   . Complete heart block ([place])     a. s/p St. Jude PPM 11/2014.  [name] AICD (automatic cardioverter/defibrillator) present   . Presence of permanent cardiac pacemaker   . Heart murmur   . CHF (congestive heart failure) ([place])   . Shortness of breath dyspnea     PAST SURGICAL HISTORY:   Past Surgical History  Procedure Laterality Date  . Colonoscopy    . Upper gi endoscopy    . Cardiac catheterization  11/20/2012    Known CAD with one vessel coronary disease of left descending artery by cardiac cath.   . Appendectomy  12/2010  . Pacemaker insertion  11-21-14    STJ Assurity dual  chamber pacemaker implanted by Dr Rayann Heman for CHB  . Permanent pacemaker insertion N/A 11/21/2014    Procedure: PERMANENT PACEMAKER INSERTION;  Surgeon: Thompson Grayer, MD;  Location: [facility] CATH LAB;  Service: Cardiovascular;  Laterality: N/A;  . Insert / replace / remove pacemaker      SOCIAL HISTORY:   Social History  Substance Use Topics  . Smoking status: Never Smoker   . Smokeless tobacco: Not on file  . Alcohol Use: No    FAMILY HISTORY:   Family History  Problem Relation Age of Onset  . Heart disease Mother   . Heart disease Father   . Colon cancer Father   . Stroke Sister     DRUG ALLERGIES:   Allergies  Allergen Reactions  . Codeine   . Norco [Hydrocodone-Acetaminophen] Diarrhea and Nausea And Vomiting  . Omeprazole Diarrhea and Nausea And Vomiting  . Requip  [Ropinirole] Nausea Only    REVIEW OF SYSTEMS:   Review of Systems  Constitutional: Positive for fever. Negative for chills, weight loss and malaise/fatigue.  HENT: Negative for congestion and hearing loss.   Eyes: Negative for blurred vision and pain.  Respiratory: Positive for cough and shortness of breath. Negative for hemoptysis, sputum production and stridor.   Cardiovascular: Positive for chest pain, orthopnea and leg swelling. Negative for palpitations.  Gastrointestinal: Negative for nausea, vomiting, abdominal pain, diarrhea, constipation and blood in stool.  Genitourinary: Negative for dysuria and frequency.  Musculoskeletal: Negative for  myalgias, back pain, joint pain and neck pain.  Skin: Negative for rash.  Neurological: Positive for weakness. Negative for focal weakness, loss of consciousness and headaches.  Endo/Heme/Allergies: Does not bruise/bleed easily.  Psychiatric/Behavioral: Negative for depression and hallucinations. The patient is not nervous/anxious.     MEDICATIONS AT HOME:   Prior to Admission medications   Medication Sig Start Date End Date Taking? Authorizing Provider   amLODipine (NORVASC) 5 MG tablet Take 1 tablet (5 mg total) by mouth daily. 12/01/14  Yes Minna Merritts, MD  apixaban (ELIQUIS) 5 MG TABS tablet Take 1 tablet (5 mg total) by mouth 2 (two) times daily. 01/16/15  Yes Minna Merritts, MD  desloratadine (CLARINEX) 5 MG tablet Take 5 mg by mouth daily.   Yes Historical Provider, MD  dicyclomine (BENTYL) 20 MG tablet 20 mg 4 (four) times daily.  04/04/13  Yes Historical Provider, MD  fluticasone (FLONASE) 50 MCG/ACT nasal spray Place 2 sprays into the nose daily.   Yes Historical Provider, MD  lansoprazole (PREVACID) 15 MG capsule Take 15 mg by mouth as needed.   Yes Historical Provider, MD  Linaclotide Rolan Lipa) 145 MCG CAPS capsule Take 1 capsule (145 mcg total) by mouth daily. 06/16/15  Yes Lucilla Lame, MD  lisinopril (PRINIVIL,ZESTRIL) 20 MG tablet Take 1 tablet (20 mg total) by mouth daily. 12/01/14  Yes Minna Merritts, MD  lubiprostone (AMITIZA) 24 MCG capsule Take 1 capsule (24 mcg total) by mouth 2 (two) times daily with a meal. 04/08/15  Yes Andria Meuse, NP  magnesium hydroxide (MILK OF MAGNESIA) 400 MG/5ML suspension Take 30 mLs by mouth daily as needed for mild constipation.   Yes Historical Provider, MD  pramipexole (MIRAPEX) 0.75 MG tablet Take 0.75 mg by mouth 1 day or 1 dose.   Yes Historical Provider, MD  Probiotic Product (ALIGN) 4 MG CAPS Take by mouth daily.   Yes Historical Provider, MD  selegiline (ELDEPRYL) 5 MG tablet Take 5 mg by mouth 2 (two) times daily with a meal.    Yes Historical Provider, MD  simethicone (MYLICON) 80 MG chewable tablet Takes 3 tablets by mouth daily.   Yes Historical Provider, MD      VITAL SIGNS:  Blood pressure 164/94, pulse 68, temperature 97.4 F (36.3 C), temperature source Oral, resp. rate 23, height 6\' 1"  (1.854 m), weight 99.338 kg (219 lb), SpO2 91 %.  PHYSICAL EXAMINATION:  GENERAL:  69 y.o.-year-old patient sitting up in the bed with no acute distress.  EYES: Pupils equal, round,  reactive to light and accommodation. No scleral icterus. Extraocular muscles intact.  HEENT: Head atraumatic, normocephalic. Oropharynx and nasopharynx clear. Oral mucous membranes pink and moist. Good dentition NECK:  Supple, no jugular venous distention. No thyroid enlargement, no tenderness. No JVD LUNGS: Normal breath sounds bilaterally, no wheezing, rales, rhonchi or crepitation. No use of accessory muscles of respiration.  CARDIOVASCULAR: Distant. S1, S2 normal. No murmurs, rubs, or gallops.  ABDOMEN: Soft, nontender, nondistended. Bowel sounds present. No organomegaly or mass. No guarding no rebound EXTREMITIES: Trace bilateral pedal edema, cyanosis, or clubbing.  NEUROLOGIC: Cranial nerves II through XII are grossly intact. Muscle strength 5/5 in all extremities. Sensation intact. Gait not checked. Upper extremity tremor at rest PSYCHIATRIC: The patient is alert and oriented x 3. Calm SKIN: No obvious rash, lesion, or ulcer.   LABORATORY PANEL:   CBC  Recent Labs Lab 07/28/15 0823  WBC 7.4  HGB 14.2  HCT 42.3  PLT 234   ------------------------------------------------------------------------------------------------------------------  Chemistries   Recent Labs Lab 07/28/15 0823  NA 138  K 3.9  CL 108  CO2 23  GLUCOSE 114*  BUN 16  CREATININE 0.99  CALCIUM 8.8*   ------------------------------------------------------------------------------------------------------------------  Cardiac Enzymes  Recent Labs Lab 07/28/15 0823  TROPONINI <0.03   ------------------------------------------------------------------------------------------------------------------  RADIOLOGY:  Dg Chest 2 View  07/28/2015  CLINICAL DATA:  Short of breath EXAM: CHEST  2 VIEW COMPARISON:  11/22/2014 FINDINGS: Pacemaker unchanged. Cardiac enlargement. Pulmonary vascular congestion with mild interstitial edema. No significant pleural effusion. Mild atelectasis in the lung bases.  IMPRESSION: Mild interstitial edema consistent with mild fluid overload. Electronically Signed   By: Franchot Gallo M.D.   On: 07/28/2015 08:39    EKG:   Orders placed or performed during the hospital encounter of 07/28/15  . EKG 12-Lead  . EKG 12-Lead    IMPRESSION AND PLAN:   #1 acute respiratory failure with hypoxia: Due to congestive heart failure exacerbation. He is responded well to supplemental oxygenation and diuresis. Continue Lasix  #2 acute on chronic systolic heart failure: Last echocardiogram shows ejection fraction 50-55%. We'll repeat this study today. Will maintain low-sodium diet, daily weight, monitor I's and O's. Cycle troponins, monitor on telemetry, recheck BNP in the morning. It is unclear what may have triggered this exacerbation.  #3 complete heart block: He has an AICD placed in 11/2014 for A. fib, bradycardia, heart block. His device was interrogated today and there was some concern that it may not be capturing properly. Will consult cardiology for further assessment and recommendations.  #4 coronary artery disease: Has had catheterization showing mild coronary artery disease. Left-sided chest pain concerning for ACS. Cycle cardiac enzymes. Continue eliquis. Check lipids in the morning. Monitor on telemetry.  #5 Parkinson's disease: Stable. States that most of his symptoms are actually gastrointestinal. Continue home regimen.  #6 hypertension: Blood pressure elevated on presentation. Continue amlodipine and lisinopril. Will also continue with diuresis.  All the records are reviewed and case discussed with ED provider. Management plans discussed with the patient, family (son) and they are in agreement.  CODE STATUS: Full TOTAL TIME TAKING CARE OF THIS PATIENT: 45 minutes.  Greater than 50% of time spent in coordination of care and counseling.  Myrtis Ser M.D on 07/28/2015 at 11:01 AM  Between 7am to 6pm - Pager - [phone]  After 6pm go to  [email or web address] - password [email or web address] [facility]  [facility]  Office  [phone]  CC: Primary care physician; Adrian Prows, MD

## 2015-07-28 NOTE — ED Notes (Addendum)
Pt states that he "always has nasal congestion but I think it went down into my chest." Son states that SOB started last night. Pt states SOB when laying flat, sitting up, but got better if he got up. Pt states chest discomfort when coughing and nasal congestion, runny nose but denies sore throat. Pt does have a pace maker on demand. Hx of cardiac cath in 2015.

## 2015-07-28 NOTE — Consult Note (Addendum)
Cardiology Consultation Note  Patient ID: Matthew Howe, MRN: [id number], DOB/AGE: Mar 01, 1946 69 y.o. Admit date: 07/28/2015   Date of Consult: 07/28/2015 Primary Physician: Adrian Prows, MD Primary Cardiologist: Esmond Plants  Chief Complaint: SOB, ABD bloating Reason for Consult: acute on chronic diastolic CHF, SOB  HPI: 69 y.o. 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 Found to have atrial fibrillation 11/22/2014 on pacemaker download, started on anticoagulation  He reports worsening shortness of breath with severe symptoms last night, stuttering symptoms for the past week or so with abdominal bloating.  He reports that he eats out frequently, sometimes [place] food, other times just a sandwich. Goes out with his wife. No fluid restrictions at home. Currently not taking a diuretic at home. In the past he has had problems with tremor secondary to Parkinson's. He sees Dr. Melrose Nakayama, neurology Significant fatigue, joint stiffness in the morning. Prior issues with constipation In the emergency room, given Lasix IV, BNP elevated, chest x-ray consistent with CHF  He denies any leg edema. Sleeps sitting upwards, has not noticed any PND or orthopnea. He does have a dry cough which is recent  Review of pacemaker shows frequent atrial sensed, V pacing also paroxysmal atrial fibrillation 2.3% of the time.  Other past medical history He presented to [facility] with bradycardia February 2016. This seemed to improve without intervention and he is asymptomatic. He had recurrent symptoms and we referred him to [facility] for further evaluation. He was seen in the emergency room and found to be in heart block, pacemaker placed urgently   Previous Stress echo  leading to cardiac catheterization at [facility].  Stress echo was a limited study, ejection fraction estimated at 45% with EF of 50% at peak stress.  Cardiac catheterization 11/20/2012 with 25% proximal LAD disease, followed by 40% lesion. The cardiac catheterization note makes mention of atrial flutter though arrhythmia is not mentioned in any of the clinic notes.  Previous Echocardiogram shows ejection fraction 50-55%, mild to moderate aortic valve insufficiency, otherwise normal study.  Abdominal CT 12/22/2010 showed acute appendicitis, colonic diverticulosis    Past Medical History  Diagnosis Date  . Hyperlipidemia   . Hypertension   . GERD (gastroesophageal reflux disease)   . Coronary artery disease   . Parkinson's disease ([place])   . Complete heart block ([place])     a. s/p St. Jude PPM 11/2014.  [name] AICD (automatic cardioverter/defibrillator) present   . Presence of permanent cardiac pacemaker   . Heart murmur   . CHF (congestive heart failure) ([place])   . Shortness of breath dyspnea       Most Recent Cardiac Studies: Echocardiogram pending, previous echocardiogram and normal ejection fraction    Surgical History:  Past Surgical History  Procedure Laterality Date  . Colonoscopy    . Upper gi endoscopy    . Cardiac catheterization  11/20/2012    Known CAD with one vessel coronary disease of left descending artery by cardiac cath.   . Appendectomy  12/2010  . Pacemaker insertion  11-21-14    STJ Assurity dual chamber pacemaker implanted by Dr Rayann Heman for CHB  . Permanent pacemaker insertion N/A 11/21/2014    Procedure: PERMANENT PACEMAKER INSERTION;  Surgeon: Thompson Grayer, MD;  Location: [facility] CATH LAB;  Service: Cardiovascular;  Laterality: N/A;  . Insert / replace /  remove pacemaker       Home Meds: Prior to Admission medications   Medication Sig Start Date End Date Taking? Authorizing Provider  amLODipine (NORVASC) 5 MG tablet Take 1 tablet (5 mg total) by mouth daily.  12/01/14  Yes Minna Merritts, MD  apixaban (ELIQUIS) 5 MG TABS tablet Take 1 tablet (5 mg total) by mouth 2 (two) times daily. 01/16/15  Yes Minna Merritts, MD  desloratadine (CLARINEX) 5 MG tablet Take 5 mg by mouth daily.   Yes Historical Provider, MD  dicyclomine (BENTYL) 20 MG tablet 20 mg 4 (four) times daily.  04/04/13  Yes Historical Provider, MD  fluticasone (FLONASE) 50 MCG/ACT nasal spray Place 2 sprays into the nose daily.   Yes Historical Provider, MD  lansoprazole (PREVACID) 15 MG capsule Take 15 mg by mouth as needed.   Yes Historical Provider, MD  Linaclotide Rolan Lipa) 145 MCG CAPS capsule Take 1 capsule (145 mcg total) by mouth daily. 06/16/15  Yes Lucilla Lame, MD  lisinopril (PRINIVIL,ZESTRIL) 20 MG tablet Take 1 tablet (20 mg total) by mouth daily. 12/01/14  Yes Minna Merritts, MD  magnesium hydroxide (MILK OF MAGNESIA) 400 MG/5ML suspension Take 30 mLs by mouth daily as needed for mild constipation.   Yes Historical Provider, MD  pramipexole (MIRAPEX) 0.75 MG tablet Take 0.75 mg by mouth 1 day or 1 dose.   Yes Historical Provider, MD  Probiotic Product (ALIGN) 4 MG CAPS Take by mouth daily.   Yes Historical Provider, MD  selegiline (ELDEPRYL) 5 MG tablet Take 5 mg by mouth 2 (two) times daily with a meal.    Yes Historical Provider, MD  simethicone (MYLICON) 80 MG chewable tablet Takes 3 tablets by mouth daily.   Yes Historical Provider, MD    Inpatient Medications:  . amLODipine  5 mg Oral Daily  . apixaban  5 mg Oral BID  . dicyclomine  20 mg Oral TID AC & HS  . fluticasone  2 spray Each Nare Daily  . furosemide  40 mg Intravenous Q12H  . Linaclotide  145 mcg Oral Daily  . lisinopril  20 mg Oral Daily  . pantoprazole  40 mg Oral Daily  . pramipexole  0.75 mg Oral Daily  . selegiline  5 mg Oral BID WC  . sodium chloride  3 mL Intravenous Q12H      Allergies:  Allergies  Allergen Reactions  . Codeine   . Norco [Hydrocodone-Acetaminophen] Diarrhea and Nausea And  Vomiting  . Omeprazole Diarrhea and Nausea And Vomiting  . Requip  [Ropinirole] Nausea Only    Social History   Social History  . Marital Status: Married    Spouse Name: N/A  . Number of Children: N/A  . Years of Education: N/A   Occupational History  . Not on file.   Social History Main Topics  . Smoking status: Never Smoker   . Smokeless tobacco: Not on file  . Alcohol Use: No  . Drug Use: No  . Sexual Activity: Not on file   Other Topics Concern  . Not on file   Social History Narrative     Family History  Problem Relation Age of Onset  . Heart disease Mother   . Heart disease Father   . Colon cancer Father   . Stroke Sister      Review of Systems: Review of Systems  Constitutional: Negative.   Respiratory: Positive for shortness of breath.   Cardiovascular: Negative.   Gastrointestinal: Negative.  Abdominal bloating  Musculoskeletal: Negative.   Neurological: Negative.   Psychiatric/Behavioral: Negative.   All other systems reviewed and are negative.   Labs:  Recent Labs  07/28/15 0823 07/28/15 1339 07/28/15 1638  TROPONINI <0.03 <0.03 0.03   Lab Results  Component Value Date   WBC 7.4 07/28/2015   HGB 14.2 07/28/2015   HCT 42.3 07/28/2015   MCV 86.5 07/28/2015   PLT 234 07/28/2015    Recent Labs Lab 07/28/15 0823 07/28/15 1638  NA 138  --   K 3.9  --   CL 108  --   CO2 23  --   BUN 16  --   CREATININE 0.99 1.12  CALCIUM 8.8*  --   GLUCOSE 114*  --    No results found for: CHOL, HDL, LDLCALC, TRIG No results found for: DDIMER  Radiology/Studies:  Dg Chest 2 View  07/28/2015  CLINICAL DATA:  Short of breath EXAM: CHEST  2 VIEW COMPARISON:  11/22/2014 FINDINGS: Pacemaker unchanged. Cardiac enlargement. Pulmonary vascular congestion with mild interstitial edema. No significant pleural effusion. Mild atelectasis in the lung bases. IMPRESSION: Mild interstitial edema consistent with mild fluid overload. Electronically Signed    By: Franchot Gallo M.D.   On: 07/28/2015 08:39    EKG: EKG on today's visit shows ventricularly paced rhythm   Weights: Filed Weights   07/28/15 0801 07/28/15 1245  Weight: 219 lb (99.338 kg) 214 lb 4.8 oz (97.206 kg)     Physical Exam:paced rhythm Blood pressure 160/69, pulse 60, temperature 98.2 F (36.8 C), temperature source Oral, resp. rate 20, height 6\' 1"  (1.854 m), weight 214 lb 4.8 oz (97.206 kg), SpO2 95 %. Body mass index is 28.28 kg/(m^2). General: Well developed, well nourished, in no acute distress. Head: Normocephalic, atraumatic, sclera non-icteric, no xanthomas, nares are without discharge.  Neck: Negative for carotid bruits. JVD not elevated. Lungs: Clear bilaterally to auscultation , scant crackles at the bases bilaterally.  Breathing is unlabored. Heart: RRR with S1 S2. No murmurs, rubs, or gallops appreciated. Abdomen: Soft, non-tender, non-distended with normoactive bowel sounds. No hepatomegaly. No rebound/guarding. No obvious abdominal masses. Msk:  Strength and tone appear normal for age. Extremities: No clubbing or cyanosis. No edema.  Distal pedal pulses are 2+ and equal bilaterally. Neuro: Alert and oriented X 3. No facial asymmetry. No focal deficit. Moves all extremities spontaneously. Psych:  Responds to questions appropriately with a normal affect.    Assessment and Plan:  69 y.o. 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 Found to have atrial fibrillation 11/22/2014 on pacemaker download, started on anticoagulation  He reports worsening shortness of breath with severe symptoms last night, stuttering symptoms for the past week or so with abdominal bloating.  1) acute on chronic diastolic CHF Likely  multifactorial including paced rhythm, paroxysmal atrial fibrillation episode seen on pacemaker download done today, diastolic dysfunction, as well as uncontrolled hypertension -Symptoms include abdominal bloating, cough. Scant crackles at the bases on exam. BNP elevated --Agree with Lasix IV twice a day --Long discussion concerning CHF management, daily weights, adjusting Lasix depending on his weight --We did discuss dietary restrictions, monitoring his fluid intake Recommend he weighed himself when he gets home to help determine his "dry weight"  2) paroxysmal atrial fibrillation Frequent episodes of atrial fibrillation noted. Likely contributing to diastolic  CHF. He is relatively asymptomatic but protected on anticoagulation.  --We will discontinue amlodipine, start diltiazem 120 g daily --We'll also start Coreg 6.25 mg twice a day --If he continues to have atrial fibrillation on pacemaker download, could add flecainide 50 mg twice a day as an outpatient  3) hypertension, uncontrolled We'll add diltiazem, hold amlodipine Add metoprolol as above Blood pressure may also improve with diuresis If blood pressure continues to run high, could increase lisinopril up to 20 minute grams twice a day or 40 mg daily  4) Parkinson's Managed by Dr. Melrose Nakayama, neurology. Stable  5) cad: Minimal disease by cardiac catheterization in 2014   Sande Rives [facility] 07/28/2015, 6:49 PM

## 2015-07-28 NOTE — ED Notes (Signed)
Pt reports has been sick with congestion for the past couple of weeks, was seen by PMD and given medication but sx's have not improved. Pt reports last pm he felt SOB and had a heaviness on his chest. Pt reports this is not happened with the illness prior. Pt c/o heaviness still in his chest. Pt reports feels like he can not get a full breath in.

## 2015-07-28 NOTE — Progress Notes (Signed)
*  PRELIMINARY RESULTS* Echocardiogram 2D Echocardiogram has been performed.  Matthew Howe Hege 07/28/2015, 3:24 PM

## 2015-07-28 NOTE — ED Provider Notes (Signed)
[facility] Emergency Department Provider Note  ____________________________________________  Time seen: Approximately 8:19 AM  I have reviewed the triage vital signs and the nursing notes.   HISTORY  Chief Complaint Shortness of Breath and Nasal Congestion    HPI Matthew Howe is a 69 y.o. male with a history of complete heart block status post AICD, CAD, HTN, HL, and Parkinson's disease presenting for shortness of breath.Patient reports that since last night he has developed shortness of breath. It is worse when he is laying flat but better if he stands up. He has some central chest tightness. He reports some nasal congestion but no change in his baseline morning cough. He denies sore throat, ear pain, calf pain, lower extremity swelling.  He reports subjective fever at home but did not take his temperature.  Patient reports last cardiac catheter was last year when he had his AICD placed.   Past Medical History  Diagnosis Date  . Hyperlipidemia   . Hypertension   . GERD (gastroesophageal reflux disease)   . Coronary artery disease   . Parkinson's disease ([place])   . Complete heart block ([place])     a. s/p St. Jude PPM 11/2014.    Patient Active Problem List   Diagnosis Date Noted  . Mitral valve regurgitation 06/01/2015  . Fatigue 06/01/2015  . Complete heart block ([place]) 11/21/2014  . CAD (coronary artery disease) 04/18/2013  . Murmur 03/15/2013  . Shortness of breath 03/15/2013  . Abdominal discomfort 03/15/2013  . Parkinson's disease ([place]) 03/15/2013  . Chest pain 03/15/2013  . Essential hypertension 03/15/2013  . Hyperlipidemia 03/15/2013    Past Surgical History  Procedure Laterality Date  . Colonoscopy    . Upper gi endoscopy    . Cardiac catheterization  11/20/2012    Known CAD with one vessel coronary disease of left descending artery by cardiac cath.   . Appendectomy  12/2010  . Pacemaker insertion  11-21-14    STJ Assurity dual chamber  pacemaker implanted by Dr Rayann Heman for CHB  . Permanent pacemaker insertion N/A 11/21/2014    Procedure: PERMANENT PACEMAKER INSERTION;  Surgeon: Thompson Grayer, MD;  Location: [facility] CATH LAB;  Service: Cardiovascular;  Laterality: N/A;  . Insert / replace / remove pacemaker      Current Outpatient Rx  Name  Route  Sig  Dispense  Refill  . amLODipine (NORVASC) 5 MG tablet   Oral   Take 1 tablet (5 mg total) by mouth daily.   90 tablet   3   . apixaban (ELIQUIS) 5 MG TABS tablet   Oral   Take 1 tablet (5 mg total) by mouth 2 (two) times daily.   60 tablet   6   . desloratadine (CLARINEX) 5 MG tablet   Oral   Take 5 mg by mouth daily.         [name] dicyclomine (BENTYL) 20 MG tablet      20 mg 4 (four) times daily.          . fluticasone (FLONASE) 50 MCG/ACT nasal spray   Nasal   Place 2 sprays into the nose daily.         . lansoprazole (PREVACID) 15 MG capsule   Oral   Take 15 mg by mouth as needed.         . lubiprostone (AMITIZA) 24 MCG capsule   Oral   Take 1 capsule (24 mcg total) by mouth 2 (two) times daily with a meal.  60 capsule   11   . pramipexole (MIRAPEX) 0.75 MG tablet   Oral   Take 0.75 mg by mouth 1 day or 1 dose.         . selegiline (ELDEPRYL) 5 MG tablet   Oral   Take 5 mg by mouth 2 (two) times daily with a meal.          . chlorpheniramine-HYDROcodone (TUSSIONEX) 10-8 MG/5ML SUER   Oral   Take 5 mLs by mouth as needed for cough.         [name] Linaclotide (LINZESS) 145 MCG CAPS capsule   Oral   Take 1 capsule (145 mcg total) by mouth daily.   30 capsule   3   . lisinopril (PRINIVIL,ZESTRIL) 20 MG tablet   Oral   Take 1 tablet (20 mg total) by mouth daily.   30 tablet   11   . magnesium hydroxide (MILK OF MAGNESIA) 400 MG/5ML suspension   Oral   Take 30 mLs by mouth daily as needed for mild constipation.         . Probiotic Product (ALIGN) 4 MG CAPS   Oral   Take by mouth daily.         . simethicone (MYLICON) 80 MG  chewable tablet      Takes 3 tablets by mouth daily.           Allergies Codeine; Norco; and Omeprazole  Family History  Problem Relation Age of Onset  . Heart disease Mother     Social History Social History  Substance Use Topics  . Smoking status: Never Smoker   . Smokeless tobacco: None  . Alcohol Use: No    Review of Systems Constitutional: No fever/chills. No syncope or lightheadedness. Eyes: No visual changes. ENT: No sore throat. Cardiovascular: Positive for chest tightness, no palpitations Respiratory: Positive shortness of breath.  No cough. Positive orthopnea Gastrointestinal: No abdominal pain.  No nausea, no vomiting.  No diarrhea.  No constipation. Genitourinary: Negative for dysuria. Musculoskeletal: Negative for back pain. Skin: Negative for rash. Neurological: Negative for headaches, focal weakness or numbness.  10-point ROS otherwise negative.  ____________________________________________   PHYSICAL EXAM:  VITAL SIGNS: ED Triage Vitals  Enc Vitals Group     BP 07/28/15 0801 178/81 mmHg     Pulse Rate 07/28/15 0801 73     Resp 07/28/15 0801 18     Temp 07/28/15 0801 97.4 F (36.3 C)     Temp Source 07/28/15 0801 Oral     SpO2 07/28/15 0801 97 %     Weight 07/28/15 0801 219 lb (99.338 kg)     Height 07/28/15 0801 6\' 1"  (1.854 m)     Head Cir --      Peak Flow --      Pain Score 07/28/15 0802 10     Pain Loc --      Pain Edu? --      Excl. in [place]? --     Constitutional: Alert and oriented. Well appearing and in no acute distress. Answer question appropriately. Eyes: Conjunctivae are normal.  EOMI. Head: Atraumatic. Nose: No congestion/rhinnorhea. Mouth/Throat: Mucous membranes are moist.  Neck: No stridor.  Supple.  No JVD. Cardiovascular: Normal rate, regular rhythm. No murmurs, rubs or gallops.  Respiratory: Normal respiratory effort.  No retractions. Lungs CTAB.  No wheezes, rales or ronchi. Gastrointestinal: Soft and nontender.  No distention. No peritoneal signs. Musculoskeletal: Mild symmetric pitting edema to the ankles only. No calf  pain or palpable cords, no Homans sign. Neurologic:  Normal speech and language. No gross focal neurologic deficits are appreciated.  Skin:  Skin is warm, dry and intact. No rash noted. Psychiatric: Mood and affect are normal. Speech and behavior are normal.  Normal judgement.  ____________________________________________   LABS (all labs ordered are listed, but only abnormal results are displayed)  Labs Reviewed  BASIC METABOLIC PANEL - Abnormal; Notable for the following:    Glucose, Bld 114 (*)    Calcium 8.8 (*)    All other components within normal limits  PROTIME-INR - Abnormal; Notable for the following:    Prothrombin Time 15.4 (*)    All other components within normal limits  BRAIN NATRIURETIC PEPTIDE - Abnormal; Notable for the following:    B Natriuretic Peptide 1247.0 (*)    All other components within normal limits  CBC  APTT  TROPONIN I   ____________________________________________  EKG  ED ECG REPORT I, Eula Listen, the attending physician, personally viewed and interpreted this ECG.   Date: 07/28/2015  EKG Time: 759  Rate: 79  Rhythm: paced  Axis: paced  Intervals:paced  ST&T Change: paced  ____________________________________________  RADIOLOGY  Dg Chest 2 View  07/28/2015  CLINICAL DATA:  Short of breath EXAM: CHEST  2 VIEW COMPARISON:  11/22/2014 FINDINGS: Pacemaker unchanged. Cardiac enlargement. Pulmonary vascular congestion with mild interstitial edema. No significant pleural effusion. Mild atelectasis in the lung bases. IMPRESSION: Mild interstitial edema consistent with mild fluid overload. Electronically Signed   By: Franchot Gallo M.D.   On: 07/28/2015 08:39    ____________________________________________   PROCEDURES  Procedure(s) performed: None  Critical Care performed:  No ____________________________________________   INITIAL IMPRESSION / ASSESSMENT AND PLAN / ED COURSE  Pertinent labs & imaging results that were available during my care of the patient were reviewed by me and considered in my medical decision making (see chart for details).  69 y.o. male with a history of complete heart block status post AICD who is currently being paced presenting with 1 day of shortness of breath and chest tightness.  Given the patient's symptoms, it is likely that his congestion is unrelated. He denies any cough, however he does report a subjective fever so we will look for pneumonia on his chest x-ray. Consider fluid overload or congestive heart failure area and will also evaluate for ACS or atypical symptoms of MI.  ----------------------------------------- 9:18 AM on 07/28/2015 -----------------------------------------  The patient does have some mild pulmonary edema on his chest x-ray and an elevated BNP. His symptoms are consistent with acute congestive heart failure, I will admit him to the hospital for further evaluation and treatment. ____________________________________________  FINAL CLINICAL IMPRESSION(S) / ED DIAGNOSES  Final diagnoses:  Acute congestive heart failure, unspecified congestive heart failure type ([place])      NEW MEDICATIONS STARTED DURING THIS VISIT:  New Prescriptions   No medications on file      Eula Listen, MD 07/28/15 [phone]

## 2015-07-28 NOTE — Progress Notes (Signed)
Was made aware by tele clerk patient had 8 beat run vtach. Dr. Rockey Situ on the floor to consult patient, md was made aware. Patient sitting up in the chair no distress noted. Sat 95% on ra. Will continue to monitor

## 2015-07-28 NOTE — ED Notes (Addendum)
Pt desat downto 80%, pt placed on 2L via [place]. Pt's O2 sat back up to 96%. NAD noted. Pt asymptomatic.

## 2015-07-28 NOTE — Progress Notes (Signed)
A@O . Patient admitted to floor. No c/o pain no sob at this time. Tolerating RA. Telemetry box verified box 40-22. 02 at 2l at bedside as needed. Skin verified by second nurse Janett Billow c. Will continue to monitor [employer]

## 2015-07-28 NOTE — ED Notes (Signed)
MD at bedside. 

## 2015-07-29 ENCOUNTER — Telehealth: Payer: Self-pay | Admitting: *Deleted

## 2015-07-29 ENCOUNTER — Telehealth: Payer: Self-pay

## 2015-07-29 LAB — BASIC METABOLIC PANEL
ANION GAP: 8 (ref 5–15)
BUN: 20 mg/dL (ref 6–20)
CO2: 28 mmol/L (ref 22–32)
Calcium: 9 mg/dL (ref 8.9–10.3)
Chloride: 104 mmol/L (ref 101–111)
Creatinine, Ser: 1.11 mg/dL (ref 0.61–1.24)
Glucose, Bld: 113 mg/dL — ABNORMAL HIGH (ref 65–99)
POTASSIUM: 4 mmol/L (ref 3.5–5.1)
SODIUM: 140 mmol/L (ref 135–145)

## 2015-07-29 LAB — CBC
HCT: 45.2 % (ref 40.0–52.0)
Hemoglobin: 15.2 g/dL (ref 13.0–18.0)
MCH: 29.4 pg (ref 26.0–34.0)
MCHC: 33.7 g/dL (ref 32.0–36.0)
MCV: 87.2 fL (ref 80.0–100.0)
PLATELETS: 223 10*3/uL (ref 150–440)
RBC: 5.18 MIL/uL (ref 4.40–5.90)
RDW: 14 % (ref 11.5–14.5)
WBC: 7.4 10*3/uL (ref 3.8–10.6)

## 2015-07-29 LAB — TROPONIN I

## 2015-07-29 LAB — LIPID PANEL
CHOL/HDL RATIO: 5.3 ratio
Cholesterol: 179 mg/dL (ref 0–200)
HDL: 34 mg/dL — ABNORMAL LOW (ref >40)
LDL Cholesterol: 123 mg/dL — ABNORMAL HIGH (ref 0–99)
TRIGLYCERIDES: 111 mg/dL (ref <150)
VLDL: 22 mg/dL (ref 0–40)

## 2015-07-29 LAB — BRAIN NATRIURETIC PEPTIDE: B Natriuretic Peptide: 844 pg/mL — ABNORMAL HIGH (ref 0.0–100.0)

## 2015-07-29 MED ORDER — SIMVASTATIN 10 MG PO TABS: 10.0000 mg | ORAL_TABLET | Freq: Every day | ORAL | Status: DC

## 2015-07-29 MED ORDER — DILTIAZEM HCL ER COATED BEADS 120 MG PO CP24: 120.0000 mg | ORAL_CAPSULE | Freq: Every day | ORAL | Status: DC

## 2015-07-29 MED ORDER — FUROSEMIDE 20 MG PO TABS: 20.0000 mg | ORAL_TABLET | ORAL | Status: DC

## 2015-07-29 MED ORDER — CARVEDILOL 6.25 MG PO TABS: 6.2500 mg | ORAL_TABLET | Freq: Two times a day (BID) | ORAL | Status: DC

## 2015-07-29 NOTE — Progress Notes (Signed)
Patient was made an initial appointment at the [facility] on August 11, 2015 at 9:00am. Thank you.

## 2015-07-29 NOTE — Telephone Encounter (Signed)
Attempted to contact pt regarding discharge from [facility] on 07/29/15. Left detailed message asking pt to call back w/ any questions or concerns regarding discharge instructions and/or medications. Advised him of appt w/ Dr. Rockey Situ on 08/24/15 @ 10:20. Asked him to call back if he is unable to keep this appt.

## 2015-07-29 NOTE — Progress Notes (Signed)
Patient: Matthew Howe / Admit Date: 07/28/2015 / Date of Encounter: 07/29/2015, 10:15 AM   Subjective: Feels much better, abdomen less bloated, back to normal, Less shortness of breath, cough resolved He feels his weight is down He did receive new medications last night and this morning including Lasix IV, diltiazem, Coreg Improvement of his blood pressure No arrhythmia on telemetry concerning for atrial fibrillation  Review of Systems: Review of Systems  Constitutional: Negative.   Respiratory: Negative.   Cardiovascular: Negative.   Gastrointestinal: Negative.   Musculoskeletal: Negative.   Neurological: Negative.   Psychiatric/Behavioral: Negative.   All other systems reviewed and are negative.   Objective: Telemetry: NSR Physical Exam: Blood pressure 146/88, pulse 59, temperature 97.6 F (36.4 C), temperature source Oral, resp. rate 18, height 6\' 1"  (1.854 m), weight 209 lb 6.4 oz (94.983 kg), SpO2 97 %. Body mass index is 27.63 kg/(m^2). General: Well developed, well nourished, in no acute distress. Head: Normocephalic, atraumatic, sclera non-icteric, no xanthomas, nares are without discharge. Neck: Negative for carotid bruits. JVP not elevated. Lungs: Clear bilaterally to auscultation without wheezes, rales, or rhonchi. Breathing is unlabored. Heart: RRR S1 S2 without murmurs, rubs, or gallops.  Abdomen: Soft, non-tender, non-distended with normoactive bowel sounds. No rebound/guarding. Extremities: No clubbing or cyanosis. No edema. Distal pedal pulses are 2+ and equal bilaterally. Neuro: Alert and oriented X 3. Moves all extremities spontaneously. Psych:  Responds to questions appropriately with a normal affect.   Intake/Output Summary (Last 24 hours) at 07/29/15 1015 Last data filed at 07/29/15 0748  Gross per 24 hour  Intake    560 ml  Output   2050 ml  Net  -1490 ml    Inpatient Medications:  . apixaban  5 mg Oral BID  . carvedilol  6.25 mg Oral BID  WC  . dicyclomine  20 mg Oral TID AC & HS  . diltiazem  120 mg Oral Daily  . fluticasone  2 spray Each Nare Daily  . furosemide  40 mg Intravenous BID  . Linaclotide  145 mcg Oral Daily  . lisinopril  20 mg Oral Daily  . pantoprazole  40 mg Oral Daily  . potassium chloride  20 mEq Oral Daily  . pramipexole  0.75 mg Oral Daily  . selegiline  5 mg Oral BID WC  . sodium chloride  3 mL Intravenous Q12H   Infusions:    Labs:  Recent Labs  07/28/15 0823 07/28/15 1638 07/29/15 0405  NA 138  --  140  K 3.9  --  4.0  CL 108  --  104  CO2 23  --  28  GLUCOSE 114*  --  113*  BUN 16  --  20  CREATININE 0.99 1.12 1.11  CALCIUM 8.8*  --  9.0   No results for input(s): AST, ALT, ALKPHOS, BILITOT, PROT, ALBUMIN in the last 72 hours.  Recent Labs  07/28/15 0823 07/29/15 0405  WBC 7.4 7.4  HGB 14.2 15.2  HCT 42.3 45.2  MCV 86.5 87.2  PLT 234 223    Recent Labs  07/28/15 0823 07/28/15 1339 07/28/15 1638 07/29/15 0004  TROPONINI <0.03 <0.03 0.03 <0.03   Invalid input(s): POCBNP No results for input(s): HGBA1C in the last 72 hours.   Weights: Filed Weights   07/28/15 0801 07/28/15 1245 07/29/15 0433  Weight: 219 lb (99.338 kg) 214 lb 4.8 oz (97.206 kg) 209 lb 6.4 oz (94.983 kg)     Radiology/Studies:  Dg Chest 2  View  07/28/2015  CLINICAL DATA:  Short of breath EXAM: CHEST  2 VIEW COMPARISON:  11/22/2014 FINDINGS: Pacemaker unchanged. Cardiac enlargement. Pulmonary vascular congestion with mild interstitial edema. No significant pleural effusion. Mild atelectasis in the lung bases. IMPRESSION: Mild interstitial edema consistent with mild fluid overload. Electronically Signed   By: Franchot Gallo M.D.   On: 07/28/2015 08:39     Assessment and Plan  69 y.o. male with diagnosis of Parkinson's who currently works delivering car parts, hypertension, cardiac catheterization showing mild CAD in 2014, hyperlipidemia, paroxysmal atrial fibrillation, pacemaker placement for  complete heart block who presents for abdominal bloating, shortness of breath. Found to have atrial fibrillation 11/22/2014 on pacemaker download, started on anticoagulation  He reports worsening shortness of breath with severe symptoms last night, stuttering symptoms for the past week or so with abdominal bloating.  1) acute on chronic diastolic CHF Likely multifactorial including paced rhythm, paroxysmal atrial fibrillation episode seen on pacemaker download, diastolic dysfunction, as well as uncontrolled hypertension -Symptoms include abdominal bloating, cough. Scant crackles at the bases on exam. BNP elevated --Feels much better this morning after significant diuresis --Suspect he can go home today. He received Lasix this morning. Blood pressure improved after medication changes ---Long discussion with him concerning CHF management, diet, monitoring his weight --- Suspect she will need Lasix twice a week at least, possibly 3 times per week (would write the prescription for daily as needed , 20 mg) Recommend he weighed himself when he gets home to help determine his "dry weight"  2) paroxysmal atrial fibrillation Frequent episodes of atrial fibrillation noted. Likely contributing to diastolic CHF. He is relatively asymptomatic but protected on anticoagulation.  ----discontinue amlodipine, continue diltiazem 120 g daily ---continue Coreg 6.25 mg twice a day --If he continues to have atrial fibrillation on pacemaker download as an outpt, could add flecainide 50 mg twice a day as an outpatient  3) hypertension, uncontrolled BP improved Continue diltiazem, hold amlodipine Continue coreg as above --lisinopril 20 minute in the pm daily  4) Parkinson's Managed by Dr. Melrose Nakayama, neurology. Stable  5) cad: Minimal disease by cardiac catheterization in 2014  Signed, Esmond Plants, MD [facility] 07/29/2015, 10:15 AM

## 2015-07-29 NOTE — Telephone Encounter (Signed)
-----   Message from Blain Pais sent at 07/29/2015  1:12 PM EDT ----- Regarding: tcm/ph 08/24/2015 10:20 Dr. Rockey Situ

## 2015-07-29 NOTE — Discharge Summary (Addendum)
Matthew Howe NAME: Matthew Howe    MR#:  [id number]  DATE OF BIRTH:  09-20-1946  DATE OF ADMISSION:  07/28/2015 ADMITTING PHYSICIAN: Aldean Jewett, MD  DATE OF DISCHARGE: 07/29/2015  PRIMARY CARE PHYSICIAN: Adrian Prows, MD    ADMISSION DIAGNOSIS:  Acute congestive heart failure, unspecified congestive heart failure type ([place]) [I50.9]  DISCHARGE DIAGNOSIS:  Active Problems:   Parkinson's disease ([place])   Essential hypertension   CAD (coronary artery disease)   Complete heart block (HCC)   Congestive heart failure (CHF) (HCC)   Acute on chronic diastolic CHF (congestive heart failure), NYHA class 1 (HCC)   Acute on chronic diastolic CHF (congestive heart failure) (HCC)   Paroxysmal atrial fibrillation ([place])   SECONDARY DIAGNOSIS:   Past Medical History  Diagnosis Date  . Hyperlipidemia   . Hypertension   . GERD (gastroesophageal reflux disease)   . Coronary artery disease   . Parkinson's disease ([place])   . Complete heart block ([place])     a. s/p St. Jude PPM 11/2014.  [name] AICD (automatic cardioverter/defibrillator) present   . Presence of permanent cardiac pacemaker   . Heart murmur   . CHF (congestive heart failure) ([place])   . Shortness of breath dyspnea     HOSPITAL COURSE:   #1 acute respiratory failure with hypoxia: Due to congestive heart failure exacerbation. He is responded well to supplemental oxygenation and diuresis. Continue Lasix  #2 acute on chronic diastolic heart failure: Last echocardiogram shows ejection fraction 50-55%.   responded well to lasix, appreciated cardiology consult.  maintain low-sodium diet, daily weight, monitor I's and O's.   Follow with CHF clinic.  #3 complete heart block: He has an AICD placed in 11/2014 for A. fib, bradycardia, heart block. His device was interrogated today and there was some concern that it may not be capturing properly.    Cardiology suggested  changes in calcium channel blockers and added betablocker for better HR control with paroxysmal A fib. Already on eliquis.  #4 coronary artery disease: Has had catheterization showing mild coronary artery disease. Left-sided chest pain concerning for ACS. Continue eliquis.  #5 Parkinson's disease: Stable. States that most of his symptoms are actually gastrointestinal. Continue home regimen.  #6 hypertension: Blood pressure elevated on presentation. Continue amlodipine and lisinopril. also continue with diuresis.   Came under control.   DISCHARGE CONDITIONS:   Stable.  CONSULTS OBTAINED:  Treatment Team:  Minna Merritts, MD  DRUG ALLERGIES:   Allergies  Allergen Reactions  . Codeine   . Norco [Hydrocodone-Acetaminophen] Diarrhea and Nausea And Vomiting  . Omeprazole Diarrhea and Nausea And Vomiting  . Requip  [Ropinirole] Nausea Only    DISCHARGE MEDICATIONS:   Current Discharge Medication List    START taking these medications   Details  carvedilol (COREG) 6.25 MG tablet Take 1 tablet (6.25 mg total) by mouth 2 (two) times daily with a meal. Qty: 60 tablet, Refills: 0    diltiazem (CARDIZEM CD) 120 MG 24 hr capsule Take 1 capsule (120 mg total) by mouth daily. Qty: 30 capsule, Refills: 0    furosemide (LASIX) 20 MG tablet Take 1 tablet (20 mg total) by mouth 3 (three) times a week. Qty: 30 tablet, Refills: 0    simvastatin (ZOCOR) 10 MG tablet Take 1 tablet (10 mg total) by mouth daily. Qty: 30 tablet, Refills: 0      CONTINUE these medications which have NOT CHANGED  Details  apixaban (ELIQUIS) 5 MG TABS tablet Take 1 tablet (5 mg total) by mouth 2 (two) times daily. Qty: 60 tablet, Refills: 6    desloratadine (CLARINEX) 5 MG tablet Take 5 mg by mouth daily.    dicyclomine (BENTYL) 20 MG tablet 20 mg 4 (four) times daily.     fluticasone (FLONASE) 50 MCG/ACT nasal spray Place 2 sprays into the nose daily.    lansoprazole (PREVACID) 15 MG capsule Take 15  mg by mouth as needed.    Linaclotide (LINZESS) 145 MCG CAPS capsule Take 1 capsule (145 mcg total) by mouth daily. Qty: 30 capsule, Refills: 3   Associated Diagnoses: Constipation, unspecified constipation type    lisinopril (PRINIVIL,ZESTRIL) 20 MG tablet Take 1 tablet (20 mg total) by mouth daily. Qty: 30 tablet, Refills: 11    magnesium hydroxide (MILK OF MAGNESIA) 400 MG/5ML suspension Take 30 mLs by mouth daily as needed for mild constipation.    pramipexole (MIRAPEX) 0.75 MG tablet Take 0.75 mg by mouth 1 day or 1 dose.    Probiotic Product (ALIGN) 4 MG CAPS Take by mouth daily.    selegiline (ELDEPRYL) 5 MG tablet Take 5 mg by mouth 2 (two) times daily with a meal.     simethicone (MYLICON) 80 MG chewable tablet Takes 3 tablets by mouth daily.      STOP taking these medications     amLODipine (NORVASC) 5 MG tablet          DISCHARGE INSTRUCTIONS:    Fluid restriction up to 1200 ml daily Low salt diet. Daily weigh your self, if > 2 Lb gain in a day or > 5 Lb in 1 week- take lasix 20 mg 2 times a day for 2 days- and still not able to get rid of extra weight- call your doctor or cardiologist.  Follow in Heart failure clinic.  If you experience worsening of your admission symptoms, develop shortness of breath, life threatening emergency, suicidal or homicidal thoughts you must seek medical attention immediately by calling 911 or calling your MD immediately  if symptoms less severe.  You Must read complete instructions/literature along with all the possible adverse reactions/side effects for all the Medicines you take and that have been prescribed to you. Take any new Medicines after you have completely understood and accept all the possible adverse reactions/side effects.   Please note  You were cared for by a hospitalist during your hospital stay. If you have any questions about your discharge medications or the care you received while you were in the hospital after  you are discharged, you can call the unit and asked to speak with the hospitalist on call if the hospitalist that took care of you is not available. Once you are discharged, your primary care physician will handle any further medical issues. Please note that NO REFILLS for any discharge medications will be authorized once you are discharged, as it is imperative that you return to your primary care physician (or establish a relationship with a primary care physician if you do not have one) for your aftercare needs so that they can reassess your need for medications and monitor your lab values.    Today   CHIEF COMPLAINT:   Chief Complaint  Patient presents with  . Shortness of Breath  . Nasal Congestion    HISTORY OF PRESENT ILLNESS:  Matthew Howe  is a 69 y.o. male with a known history of coronary artery disease, complete heart block status post placement  of [place] in 11/2014, Parkinson's disease and hypertension presents today with several days of worsening shortness of breath and increasing lower extremity edema. Last night he developed left-sided chest pressure and orthopnea. He denies diaphoresis nausea or presyncope. He has had a cough with some sinus type congestion no hemoptysis, no chills, has had subjective fever. On emergency room evaluation his chest x-ray shows mild interstitial edema and his BNP is elevated consistent with congestive heart failure exacerbation. He also became acutely hypoxic with O2 sat 86% on room air.    VITAL SIGNS:  Blood pressure 146/88, pulse 59, temperature 97.6 F (36.4 C), temperature source Oral, resp. rate 18, height 6\' 1"  (1.854 m), weight 94.983 kg (209 lb 6.4 oz), SpO2 98 %.  I/O:    Intake/Output Summary (Last 24 hours) at 07/29/15 1320 Last data filed at 07/29/15 0900  Gross per 24 hour  Intake    560 ml  Output   2050 ml  Net  -1490 ml    PHYSICAL EXAMINATION:   GENERAL: 69 y.o.-year-old patient sitting up in the bed with no  acute distress.  EYES: Pupils equal, round, reactive to light and accommodation. No scleral icterus. Extraocular muscles intact.  HEENT: Head atraumatic, normocephalic. Oropharynx and nasopharynx clear. Oral mucous membranes pink and moist. Good dentition NECK: Supple, no jugular venous distention. No thyroid enlargement, no tenderness. No JVD LUNGS: Normal breath sounds bilaterally, no wheezing, rales, rhonchi or crepitation. No use of accessory muscles of respiration.  CARDIOVASCULAR: Distant. S1, S2 normal. No murmurs, rubs, or gallops.  ABDOMEN: Soft, nontender, nondistended. Bowel sounds present. No organomegaly or mass. No guarding no rebound EXTREMITIES: Trace bilateral pedal edema, cyanosis, or clubbing.  NEUROLOGIC: Cranial nerves II through XII are grossly intact. Muscle strength 5/5 in all extremities. Sensation intact. Gait not checked. Upper extremity tremor at rest PSYCHIATRIC: The patient is alert and oriented x 3. Calm SKIN: No obvious rash, lesion, or ulcer.   DATA REVIEW:   CBC  Recent Labs Lab 07/29/15 0405  WBC 7.4  HGB 15.2  HCT 45.2  PLT 223    Chemistries   Recent Labs Lab 07/29/15 0405  NA 140  K 4.0  CL 104  CO2 28  GLUCOSE 113*  BUN 20  CREATININE 1.11  CALCIUM 9.0    Cardiac Enzymes  Recent Labs Lab 07/29/15 0004  TROPONINI <0.03    Microbiology Results  Results for orders placed or performed during the hospital encounter of 11/21/14  Blood culture (routine x 2)     Status: None   Collection Time: 11/21/14 12:05 PM  Result Value Ref Range Status   Specimen Description BLOOD LEFT FOREARM  Final   Special Requests BOTTLES DRAWN AEROBIC ONLY [place]  Final   Culture   Final    NO GROWTH 5 DAYS Performed at [facility]    Report Status 11/27/2014 FINAL  Final  Blood culture (routine x 2)     Status: None   Collection Time: 11/21/14 12:36 PM  Result Value Ref Range Status   Specimen Description BLOOD RIGHT ANTECUBITAL   Final   Special Requests BOTTLES DRAWN AEROBIC AND ANAEROBIC 10ML EACH  Final   Culture   Final    NO GROWTH 5 DAYS Performed at [facility]    Report Status 11/27/2014 FINAL  Final    RADIOLOGY:  Dg Chest 2 View  07/28/2015  CLINICAL DATA:  Short of breath EXAM: CHEST  2 VIEW COMPARISON:  11/22/2014 FINDINGS:  Pacemaker unchanged. Cardiac enlargement. Pulmonary vascular congestion with mild interstitial edema. No significant pleural effusion. Mild atelectasis in the lung bases. IMPRESSION: Mild interstitial edema consistent with mild fluid overload. Electronically Signed   By: Franchot Gallo M.D.   On: 07/28/2015 08:39     Management plans discussed with the patient, family and they are in agreement.  CODE STATUS:     Code Status Orders        Start     Ordered   07/28/15 1248  Full code   Continuous     07/28/15 1247    Advance Directive Documentation        Most Recent Value   Type of Advance Directive  Healthcare Power of Attorney   Pre-existing out of facility DNR order (yellow form or pink MOST form)     "MOST" Form in Place?        TOTAL TIME TAKING CARE OF THIS PATIENT: 35 minutes.    Vaughan Basta M.D on 07/29/2015 at 1:20 PM  Between 7am to 6pm - Pager - [phone]  After 6pm go to [email or web address] - password [email or web address] [facility]  [place] [facility]  Office  [phone]  CC: Primary care physician; Adrian Prows, MD   Note: This dictation was prepared with Dragon dictation along with smaller phrase technology. Any transcriptional errors that result from this process are unintentional.

## 2015-07-29 NOTE — Discharge Instructions (Signed)
Heart Failure Clinic appointment on August 11, 2015 at 9:00am with Darylene Price, [place]. Please call [phone] to reschedule.   Fluid restriction up to 1200 ml daily Low salt diet. Daily weigh your self, if > 2 Lb gain in a day or > 5 Lb in 1 week- take lasix 20 mg once a day for 2 days- and still not able to get rid of extra weight- call your doctor or cardiologist.

## 2015-07-29 NOTE — Telephone Encounter (Signed)
Pt wife calling stating that on the hospital medication list they had Simvastatin 10 mg on it Pt was taken off these years ago  And 1 of 3 that he was given today should be helping with his cholesterol. If we think pt is suppose to be on that medication above please send to  [facility] [place].  Please advise

## 2015-07-29 NOTE — Telephone Encounter (Signed)
Spoke w/ pt's wife.   She questioned why pt was put on simvastatin, advised her that pt's TC was 179 while in the hospital and that number needs to be less than 150. She thought that one of pt's new BP meds would help lower his cholesterol, as well.  Advised her what each med does. She requests a new rx [facility] to [facility], as it was sent w/ no refills. Asked her to call back w/ any questions or concerns.

## 2015-08-08 IMAGING — CT CT ABD-PELV W/ CM
2 of 5 series · 17 of 46 positions shown, 19 images · IV contrast (omnipaque)
Comparison: 11/18/2014 plain films.  03/22/2013 CT.

CLINICAL DATA: Lower abdominal pain and constipation for 2-3 weeks.
No injury. Prior appendectomy. Parkinson's disease.

EXAM:
CT ABDOMEN AND PELVIS WITH CONTRAST
TECHNIQUE: Multidetector CT imaging of the abdomen and pelvis was performed
using the standard protocol following bolus administration of
intravenous contrast.
CONTRAST:  100 cc Omnipaque 350

[Series 2: routine with · axial · 0.78mm/px · z∈[-1045,-645]mm · 14 of 90 slices shown, 16 images]
[im 5/90  soft-tissue]
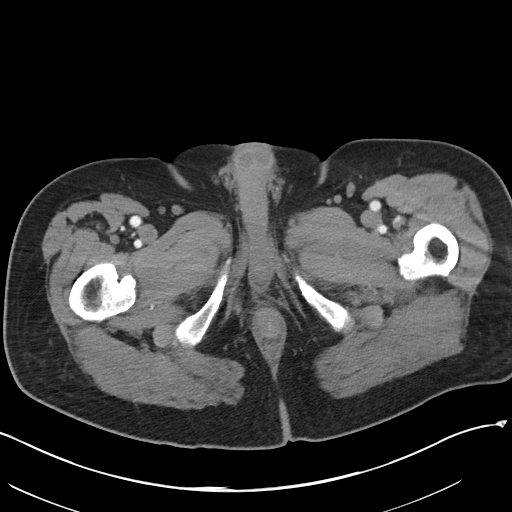
[im 5/90  bone]
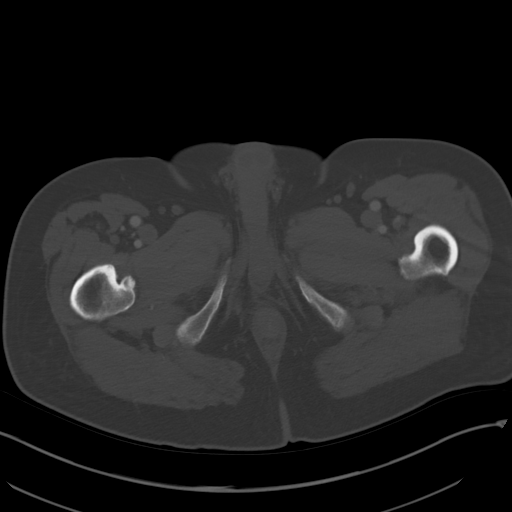
[im 10/90  soft-tissue]
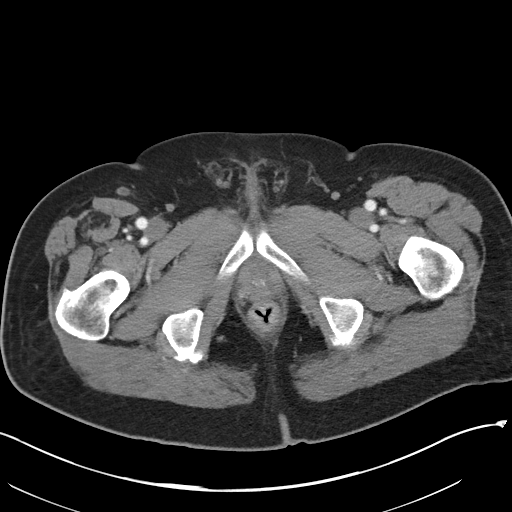
[im 19/90  soft-tissue]
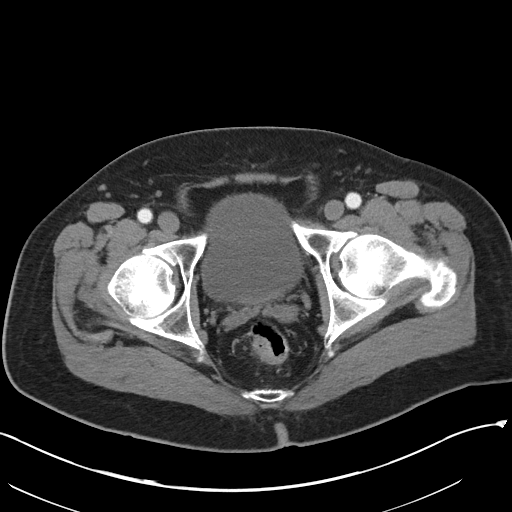
[im 24/90  soft-tissue]
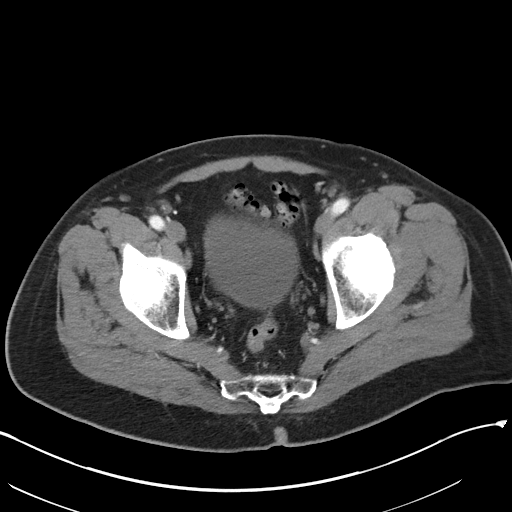
[im 29/90  soft-tissue]
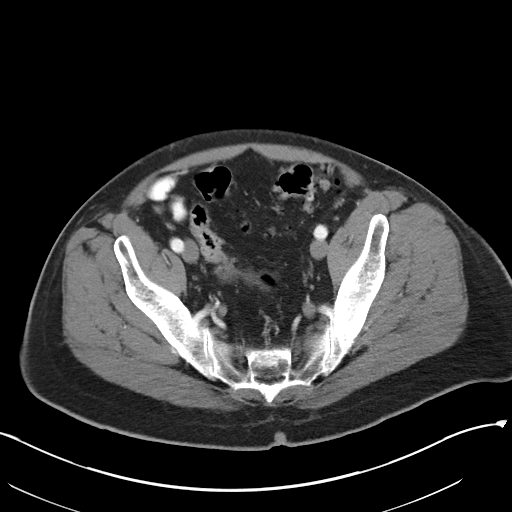
[im 38/90  soft-tissue]
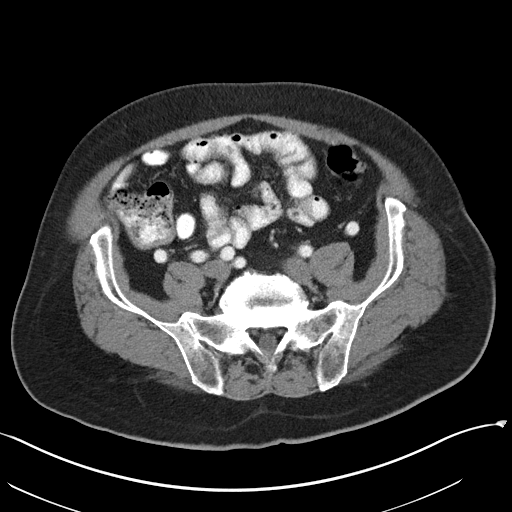
[im 43/90  soft-tissue]
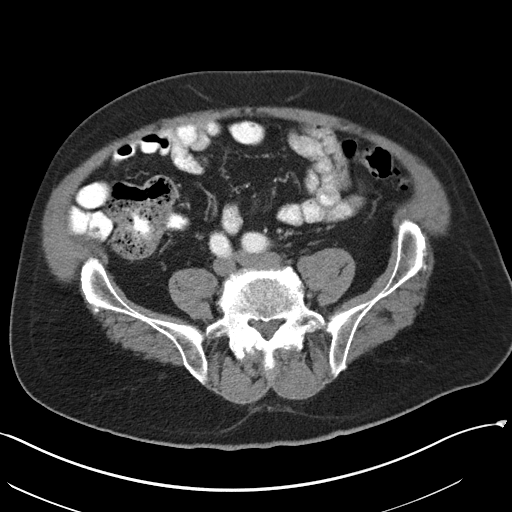
[im 47/90  soft-tissue]
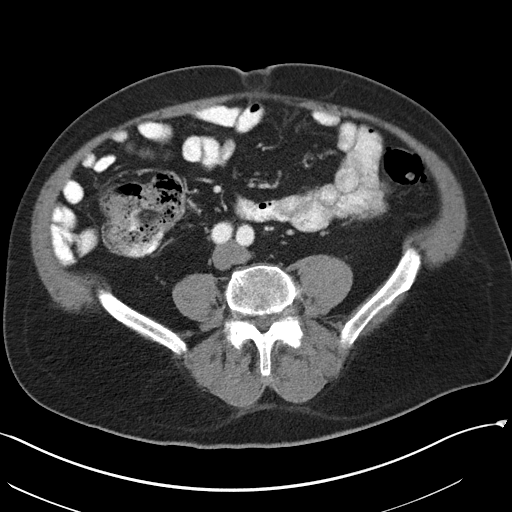
[im 52/90  soft-tissue]
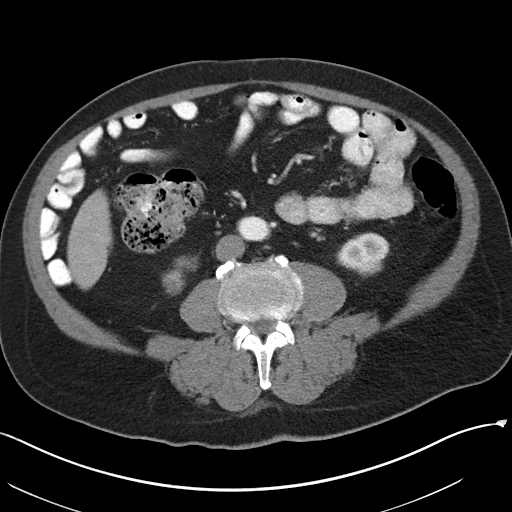
[im 52/90  bone]
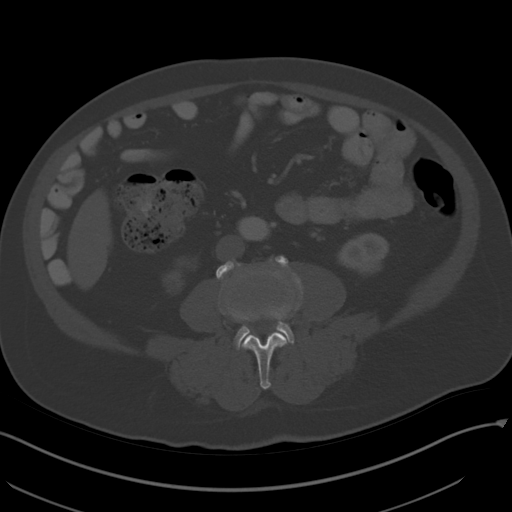
[im 61/90  soft-tissue]
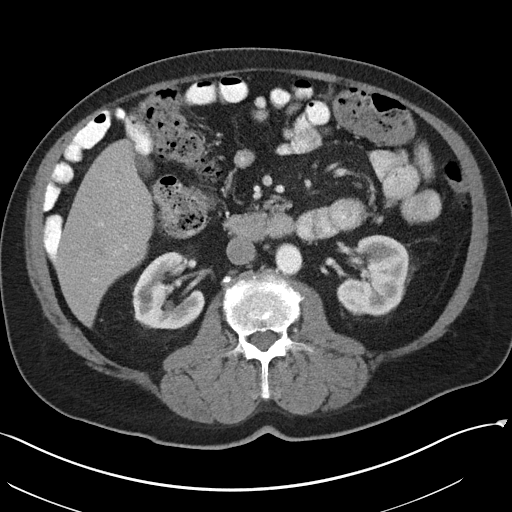
[im 66/90  soft-tissue]
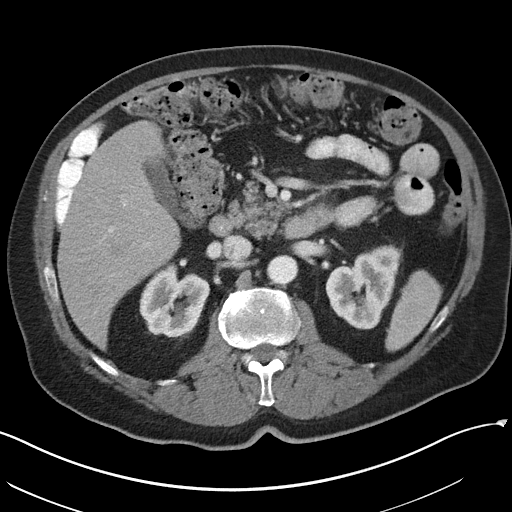
[im 71/90  soft-tissue]
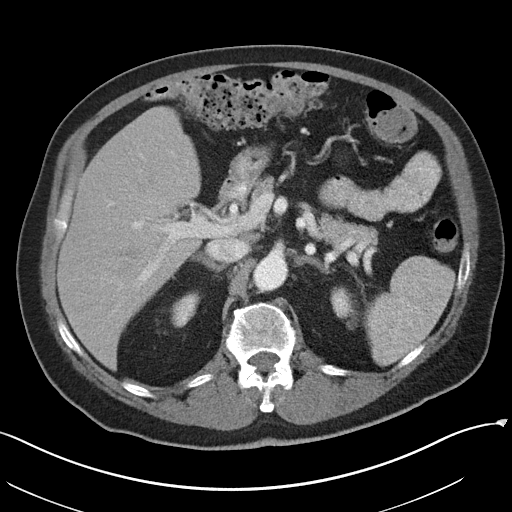
[im 80/90  soft-tissue]
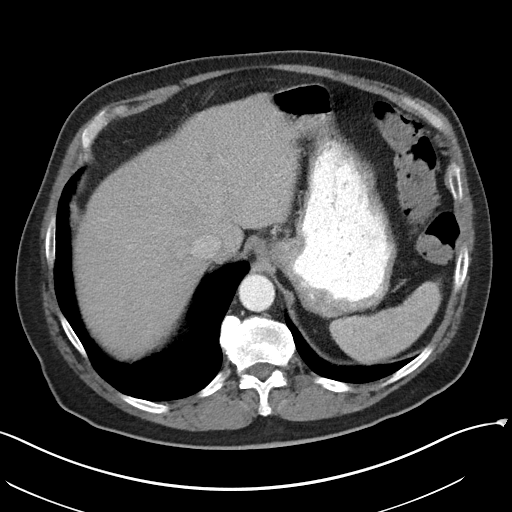
[im 85/90  soft-tissue]
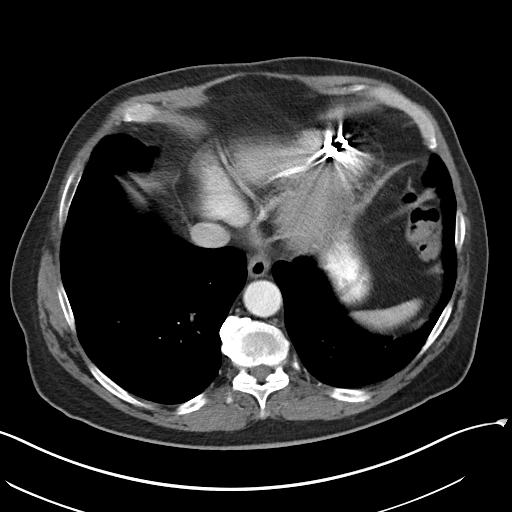

[Series 6: cor routine with · coronal · 0.84mm/px · 3 of 165 slices shown]
[im 55/165  soft-tissue]
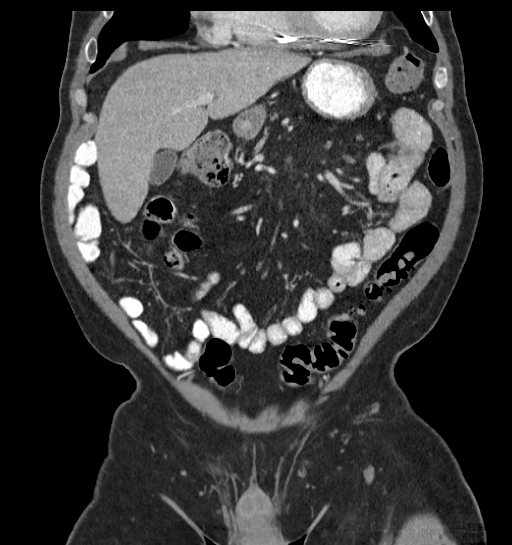
[im 73/165  soft-tissue]
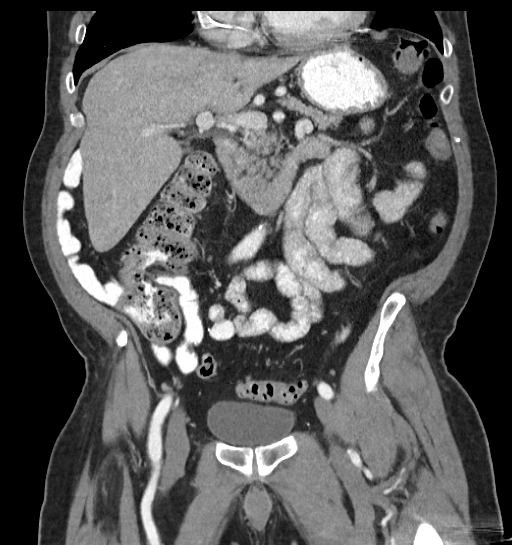
[im 92/165  soft-tissue]
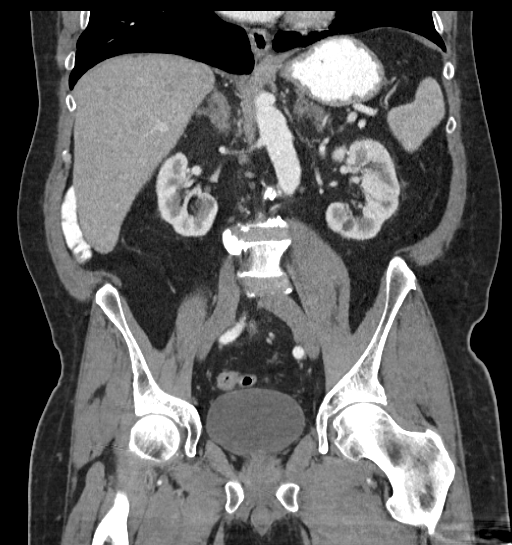

[17 of 46 positions shown; findings below may reference images not displayed]

FINDINGS: Lower chest: Clear lung bases. Mild cardiomegaly with a pacer in
place. No pericardial or pleural effusion.

Hepatobiliary: Too small to characterize lateral segment left liver
lobe lesion is unchanged and likely a cyst. Normal gallbladder,
without biliary ductal dilatation.

Pancreas: Normal, without mass or ductal dilatation.

Spleen: Normal

Adrenals/Urinary Tract: Normal adrenal glands. Mild renal cortical
thinning bilaterally. Bilateral renal cysts and too small to
characterize lesions. No hydronephrosis. Normal ureters and urinary
bladder.

Stomach/Bowel: Normal stomach, without wall thickening. Scattered
colonic diverticula. Colonic stool burden suggests constipation.
Normal terminal ileum. Appendectomy. Normal small bowel.

Vascular/Lymphatic: Normal caliber of the aorta and branch vessels.
No abdominopelvic adenopathy.

Reproductive: Normal prostate.

Other: No significant free fluid. Tiny fat containing paraumbilical
hernia.

Musculoskeletal: Partial degenerative fusion of the bilateral
sacroiliac joints. Degenerative disc disease at the lumbosacral
junction.
IMPRESSION: 1.  Possible constipation.
2. No other explanation for abdominal pain.

## 2015-08-10 ENCOUNTER — Telehealth: Payer: Self-pay | Admitting: *Deleted

## 2015-08-10 NOTE — Telephone Encounter (Signed)
Could increase lisinopril up to 40 mg daily Could either take 20 mg twice a day or 40 mg in the evening One way to decrease blood pressure is well would be not to forget the Lasix Take the Lasix a few times per week with potassium, possibly 2 to 3 times per week

## 2015-08-10 NOTE — Telephone Encounter (Signed)
Per hospital note on 07/29/15:  " 3) hypertension, uncontrolled BP improved Continue diltiazem, hold amlodipine Continue coreg as above --lisinopril 20 minute in the pm daily"  Pt's wife does not feel that these meds are adequate, as pt's BP has been elevated since discharge.

## 2015-08-10 NOTE — Telephone Encounter (Signed)
Pt wife calling giving BP issues   Pt c/o BP issue: STAT if pt c/o blurred vision, one-sided weakness or slurred speech  1. What are your last 5 BP readings?  08/01/15: 146/71 hr 59 08/03/15: 164/88 hr 59 08/04/15: 158/73 hr 59 08/05/15: 148/77 hr 59 08/06/15 163/83 hr 60 08/07/15 160/74 hr 59  08/08/15 166/83 hr 59 08/09/15 150/84 hr 60  2. Are you having any other symptoms (ex. Dizziness, headache, blurred vision, passed out)? No   3. What is your BP issue? In hospital changed amlodipine and the 2 new med's, she doesn't think they are working.

## 2015-08-11 ENCOUNTER — Ambulatory Visit: Payer: Medicare Other | Attending: Family | Admitting: Family

## 2015-08-11 ENCOUNTER — Encounter: Payer: Self-pay | Admitting: Family

## 2015-08-11 VITALS — BP 162/73 | HR 66 | Resp 20 | Ht 73.0 in | Wt 217.0 lb

## 2015-08-11 DIAGNOSIS — I4891 Unspecified atrial fibrillation: Secondary | ICD-10-CM | POA: Insufficient documentation

## 2015-08-11 DIAGNOSIS — K219 Gastro-esophageal reflux disease without esophagitis: Secondary | ICD-10-CM | POA: Insufficient documentation

## 2015-08-11 DIAGNOSIS — Z950 Presence of cardiac pacemaker: Secondary | ICD-10-CM | POA: Diagnosis not present

## 2015-08-11 DIAGNOSIS — I2510 Atherosclerotic heart disease of native coronary artery without angina pectoris: Secondary | ICD-10-CM | POA: Diagnosis not present

## 2015-08-11 DIAGNOSIS — I480 Paroxysmal atrial fibrillation: Secondary | ICD-10-CM

## 2015-08-11 DIAGNOSIS — I5032 Chronic diastolic (congestive) heart failure: Secondary | ICD-10-CM

## 2015-08-11 DIAGNOSIS — E785 Hyperlipidemia, unspecified: Secondary | ICD-10-CM | POA: Diagnosis not present

## 2015-08-11 DIAGNOSIS — Z95810 Presence of automatic (implantable) cardiac defibrillator: Secondary | ICD-10-CM | POA: Insufficient documentation

## 2015-08-11 DIAGNOSIS — Z7901 Long term (current) use of anticoagulants: Secondary | ICD-10-CM | POA: Insufficient documentation

## 2015-08-11 DIAGNOSIS — I10 Essential (primary) hypertension: Secondary | ICD-10-CM

## 2015-08-11 DIAGNOSIS — I442 Atrioventricular block, complete: Secondary | ICD-10-CM | POA: Insufficient documentation

## 2015-08-11 DIAGNOSIS — G20A1 Parkinson's disease without dyskinesia, without mention of fluctuations: Secondary | ICD-10-CM

## 2015-08-11 DIAGNOSIS — G20 Parkinson's disease: Secondary | ICD-10-CM | POA: Diagnosis not present

## 2015-08-11 DIAGNOSIS — Z79899 Other long term (current) drug therapy: Secondary | ICD-10-CM | POA: Diagnosis not present

## 2015-08-11 DIAGNOSIS — R011 Cardiac murmur, unspecified: Secondary | ICD-10-CM | POA: Insufficient documentation

## 2015-08-11 MED ORDER — FUROSEMIDE 20 MG PO TABS: 20.0000 mg | ORAL_TABLET | ORAL | Status: DC

## 2015-08-11 MED ORDER — LISINOPRIL 40 MG PO TABS: 40.0000 mg | ORAL_TABLET | Freq: Every day | ORAL | Status: DC

## 2015-08-11 NOTE — Patient Instructions (Addendum)
Continue weighing daily and call for an overnight weight gain of > 2 pounds or a weekly weight gain of >5 pounds. 

## 2015-08-11 NOTE — Telephone Encounter (Signed)
Spoke w/ pt's wife.  Advised her of Dr. Donivan Scull recommendation.  She reports that pt has been taking lasix each day.  She verbalizes understanding and will call back w/ any questions or concerns.

## 2015-08-11 NOTE — Progress Notes (Signed)
Subjective:    Patient ID: Matthew Howe, male    DOB: 1946-02-04, 69 y.o.   MRN: [id number]  Congestive Heart Failure Presents for initial visit. The disease course has been stable. Associated symptoms include abdominal pain (due to parkinson's), fatigue and shortness of breath (with heavy exertion). Pertinent negatives include no chest pain, chest pressure, edema, orthopnea, palpitations or paroxysmal nocturnal dyspnea. The symptoms have been stable. Past treatments include beta blockers, salt and fluid restriction and ACE inhibitors. The treatment provided moderate relief. Compliance with prior treatments has been good. His past medical history is significant for CAD and HTN. He has multiple 1st degree relatives with heart disease. Compliance with total regimen is 76-100%.  Other This is a chronic (fatigue) problem. The current episode started more than 1 year ago. The problem occurs daily. The problem has been unchanged. Associated symptoms include abdominal pain (due to parkinson's), arthralgias ("stiff all over in the mornings"), congestion and fatigue. Pertinent negatives include no chest pain, coughing, headaches, neck pain, sore throat or weakness. The symptoms are aggravated by walking and exertion. He has tried rest for the symptoms. The treatment provided moderate relief.    Past Medical History  Diagnosis Date  . Hyperlipidemia   . Hypertension   . GERD (gastroesophageal reflux disease)   . Coronary artery disease   . Parkinson's disease ([place])   . Complete heart block ([place])     a. s/p St. Jude PPM 11/2014.  [name] AICD (automatic cardioverter/defibrillator) present   . Presence of permanent cardiac pacemaker   . Heart murmur   . CHF (congestive heart failure) ([place])   . Shortness of breath dyspnea     Past Surgical History  Procedure Laterality Date  . Colonoscopy    . Upper gi endoscopy    . Cardiac catheterization  11/20/2012    Known CAD with one vessel coronary disease of  left descending artery by cardiac cath.   . Appendectomy  12/2010  . Pacemaker insertion  11-21-14    STJ Assurity dual chamber pacemaker implanted by Dr Rayann Heman for CHB  . Permanent pacemaker insertion N/A 11/21/2014    Procedure: PERMANENT PACEMAKER INSERTION;  Surgeon: Thompson Grayer, MD;  Location: [facility] CATH LAB;  Service: Cardiovascular;  Laterality: N/A;  . Insert / replace / remove pacemaker      Family History  Problem Relation Age of Onset  . Heart disease Mother   . Heart disease Father   . Colon cancer Father   . Stroke Sister     Social History  Substance Use Topics  . Smoking status: Never Smoker   . Smokeless tobacco: Not on file  . Alcohol Use: No    Allergies  Allergen Reactions  . Codeine   . Norco [Hydrocodone-Acetaminophen] Diarrhea and Nausea And Vomiting  . Omeprazole Diarrhea and Nausea And Vomiting  . Requip  [Ropinirole] Nausea Only    Prior to Admission medications   Medication Sig Start Date End Date Taking? Authorizing Provider  apixaban (ELIQUIS) 5 MG TABS tablet Take 1 tablet (5 mg total) by mouth 2 (two) times daily. 01/16/15  Yes Minna Merritts, MD  carvedilol (COREG) 6.25 MG tablet Take 1 tablet (6.25 mg total) by mouth 2 (two) times daily with a meal. 07/29/15  Yes Vaughan Basta, MD  desloratadine (CLARINEX) 5 MG tablet Take 5 mg by mouth daily.   Yes Historical Provider, MD  dicyclomine (BENTYL) 20 MG tablet 20 mg 4 (four) times daily.  04/04/13  Yes  Historical Provider, MD  diltiazem (CARDIZEM CD) 120 MG 24 hr capsule Take 1 capsule (120 mg total) by mouth daily. 07/29/15  Yes Vaughan Basta, MD  fluticasone (FLONASE) 50 MCG/ACT nasal spray Place 2 sprays into the nose daily.   Yes Historical Provider, MD  furosemide (LASIX) 20 MG tablet Take 1 tablet (20 mg total) by mouth 3 (three) times a week. Patient taking differently: Take 20 mg by mouth 2 (two) times daily as needed.  08/11/15  Yes Minna Merritts, MD  lansoprazole (PREVACID)  15 MG capsule Take 15 mg by mouth as needed.   Yes Historical Provider, MD  Linaclotide Rolan Lipa) 145 MCG CAPS capsule Take 1 capsule (145 mcg total) by mouth daily. 06/16/15  Yes Lucilla Lame, MD  lisinopril (PRINIVIL,ZESTRIL) 40 MG tablet Take 1 tablet (40 mg total) by mouth daily. 08/11/15  Yes Minna Merritts, MD  magnesium hydroxide (MILK OF MAGNESIA) 400 MG/5ML suspension Take 30 mLs by mouth daily as needed for mild constipation.   Yes Historical Provider, MD  potassium chloride SA (K-DUR,KLOR-CON) 20 MEQ tablet Take 20 mEq by mouth 2 (two) times daily as needed.    Yes Historical Provider, MD  pramipexole (MIRAPEX) 0.75 MG tablet Take 0.75 mg by mouth 1 day or 1 dose.   Yes Historical Provider, MD  selegiline (ELDEPRYL) 5 MG tablet Take 5 mg by mouth 2 (two) times daily with a meal.    Yes Historical Provider, MD  simethicone (MYLICON) 80 MG chewable tablet Takes 3 tablets by mouth daily.   Yes Historical Provider, MD  simvastatin (ZOCOR) 10 MG tablet Take 1 tablet (10 mg total) by mouth daily. 07/29/15  Yes Minna Merritts, MD     Review of Systems  Constitutional: Positive for appetite change (decreased due to parkinson's disease) and fatigue.  HENT: Positive for congestion and postnasal drip. Negative for sore throat.   Eyes: Negative.   Respiratory: Positive for shortness of breath (with heavy exertion). Negative for cough and chest tightness.   Cardiovascular: Positive for leg swelling. Negative for chest pain and palpitations.  Gastrointestinal: Positive for abdominal pain (due to parkinson's). Negative for abdominal distention.  Endocrine: Negative.   Genitourinary: Positive for difficulty urinating. Negative for hematuria.  Musculoskeletal: Positive for arthralgias ("stiff all over in the mornings"). Negative for neck pain.  Skin: Negative.   Allergic/Immunologic: Negative.   Neurological: Negative for dizziness, weakness, light-headedness and headaches.  Hematological:  Negative for adenopathy. Bruises/bleeds easily.  Psychiatric/Behavioral: Positive for sleep disturbance (sleeping on 2 pillows . trouble sleeping due to nasal drainage) and dysphoric mood. The patient is not nervous/anxious.        Objective:   Physical Exam  Constitutional: He is oriented to person, place, and time. He appears well-developed and well-nourished.  HENT:  Head: Normocephalic and atraumatic.  Eyes: Conjunctivae are normal. Pupils are equal, round, and reactive to light.  Neck: Normal range of motion. Neck supple.  Cardiovascular: Normal rate and regular rhythm.   Pulmonary/Chest: Effort normal. He has no wheezes. He has no rales.  Abdominal: Soft. He exhibits no distension. There is no tenderness.  Musculoskeletal: He exhibits no edema or tenderness.  Neurological: He is alert and oriented to person, place, and time.  Skin: Skin is warm and dry.  Psychiatric: He has a normal mood and affect. His behavior is normal. Thought content normal.  Nursing note and vitals reviewed.   BP 162/73 mmHg  Pulse 66  Resp 20  Ht 6\' 1"  (1.854  m)  Wt 217 lb (98.431 kg)  BMI 28.64 kg/m2  SpO2 99%       Assessment & Plan:  1:Chronic heart failure with preserved ejection fraction- Patient presents with shortness of breath with heavy exertion as well as fatigue with exertion. He denied having symptoms upon walking into the office today. When he does get tired, he will sit down to rest until his energy level improves. He is already weighing himself daily and his home weight chart shows a consistent weight except one day he gained 2.5 pounds overnight. That next morning he took his furosemide twice daily instead of daily. Discussed with him taking his potassium tablet each time he takes his diuretic. He can also call for an overnight weight gain of >2 pounds or a weekly weight gain of >5 pounds. He is not adding any salt to his food and is trying to monitor his sodium intake. Reviewed a 2000mg   sodium diet with him and his wife and written information was given to them as well. No edema in his lower legs. Remains quite active and still works a few days a week. [place] Pharm D reviewed medications with patient.  2: HTN- Blood pressure elevated today. Home blood pressure readings are also somewhat high. They were just informed this morning by his cardiologist to increase his lisinopril to 40mg  daily.  3: Atrial fibrillation- Currently rate controlled and in a regular rhythm. Taking eliquis, carvedilol and diltiazem.  4: Parkinson's- He feels like this is the most difficult of his health issues to manage. He reports having quite a bit of abdominal discomfort related to this which makes him have difficulty with his digestion. Feels bloated often. Does admit to feeling down about this diagnosis but is trying to remain active both physically and mentally. Follows closely with his neurologist.   Return here in 1 month or sooner for any questions/problems before then.

## 2015-08-24 ENCOUNTER — Other Ambulatory Visit: Payer: Self-pay | Admitting: Family

## 2015-08-24 ENCOUNTER — Encounter: Payer: Medicare Other | Admitting: Cardiovascular Disease

## 2015-08-24 MED ORDER — CARVEDILOL 6.25 MG PO TABS: 6.2500 mg | ORAL_TABLET | Freq: Two times a day (BID) | ORAL | Status: DC

## 2015-08-25 ENCOUNTER — Ambulatory Visit: Payer: Medicare Other

## 2015-08-27 ENCOUNTER — Ambulatory Visit (INDEPENDENT_AMBULATORY_CARE_PROVIDER_SITE_OTHER): Payer: Medicare Other | Admitting: *Deleted

## 2015-08-27 DIAGNOSIS — I442 Atrioventricular block, complete: Secondary | ICD-10-CM

## 2015-08-27 LAB — CUP PACEART REMOTE DEVICE CHECK
Battery Remaining Longevity: 119 mo
Brady Statistic AP VS Percent: 1 %
Implantable Lead Implant Date: 20160212
Implantable Lead Location: 753860
Lead Channel Impedance Value: 450 Ohm
Lead Channel Pacing Threshold Amplitude: 1.375 V
Lead Channel Setting Pacing Amplitude: 1.625
MDC IDC LEAD IMPLANT DT: 20160212
MDC IDC LEAD LOCATION: 753859
MDC IDC LEAD MODEL: 1948
MDC IDC MSMT BATTERY REMAINING PERCENTAGE: 95.5 %
MDC IDC MSMT BATTERY VOLTAGE: 3.01 V
MDC IDC MSMT LEADCHNL RA PACING THRESHOLD AMPLITUDE: 0.625 V
MDC IDC MSMT LEADCHNL RA PACING THRESHOLD PULSEWIDTH: 0.4 ms
MDC IDC MSMT LEADCHNL RA SENSING INTR AMPL: 2.6 mV
MDC IDC MSMT LEADCHNL RV IMPEDANCE VALUE: 580 Ohm
MDC IDC MSMT LEADCHNL RV PACING THRESHOLD PULSEWIDTH: 0.4 ms
MDC IDC MSMT LEADCHNL RV SENSING INTR AMPL: 12 mV
MDC IDC SESS DTM: 20161115103239
MDC IDC SET LEADCHNL RA PACING AMPLITUDE: 1.625
MDC IDC SET LEADCHNL RV PACING PULSEWIDTH: 0.4 ms
MDC IDC SET LEADCHNL RV SENSING SENSITIVITY: 4 mV
MDC IDC STAT BRADY AP VP PERCENT: 43 %
MDC IDC STAT BRADY AS VP PERCENT: 57 %
MDC IDC STAT BRADY AS VS PERCENT: 1 %
MDC IDC STAT BRADY RA PERCENT PACED: 43 %
MDC IDC STAT BRADY RV PERCENT PACED: 99 %
Pulse Gen Model: 2240
Pulse Gen Serial Number: 3049931

## 2015-08-27 NOTE — Progress Notes (Signed)
Remote pacemaker transmission.   

## 2015-09-01 ENCOUNTER — Other Ambulatory Visit: Payer: Self-pay | Admitting: Family

## 2015-09-01 MED ORDER — FUROSEMIDE 20 MG PO TABS: 20.0000 mg | ORAL_TABLET | Freq: Two times a day (BID) | ORAL | Status: DC | PRN

## 2015-09-17 ENCOUNTER — Encounter: Payer: Self-pay | Admitting: Family

## 2015-09-17 ENCOUNTER — Ambulatory Visit: Payer: Medicare Other | Attending: Family | Admitting: Family

## 2015-09-17 VITALS — BP 149/67 | HR 58 | Resp 18 | Ht 73.0 in | Wt 220.0 lb

## 2015-09-17 DIAGNOSIS — I4891 Unspecified atrial fibrillation: Secondary | ICD-10-CM | POA: Insufficient documentation

## 2015-09-17 DIAGNOSIS — I2510 Atherosclerotic heart disease of native coronary artery without angina pectoris: Secondary | ICD-10-CM | POA: Insufficient documentation

## 2015-09-17 DIAGNOSIS — Z95810 Presence of automatic (implantable) cardiac defibrillator: Secondary | ICD-10-CM | POA: Insufficient documentation

## 2015-09-17 DIAGNOSIS — R001 Bradycardia, unspecified: Secondary | ICD-10-CM | POA: Insufficient documentation

## 2015-09-17 DIAGNOSIS — E785 Hyperlipidemia, unspecified: Secondary | ICD-10-CM | POA: Diagnosis not present

## 2015-09-17 DIAGNOSIS — K219 Gastro-esophageal reflux disease without esophagitis: Secondary | ICD-10-CM | POA: Diagnosis not present

## 2015-09-17 DIAGNOSIS — I10 Essential (primary) hypertension: Secondary | ICD-10-CM | POA: Diagnosis not present

## 2015-09-17 DIAGNOSIS — Z7901 Long term (current) use of anticoagulants: Secondary | ICD-10-CM | POA: Diagnosis not present

## 2015-09-17 DIAGNOSIS — G20 Parkinson's disease: Secondary | ICD-10-CM | POA: Insufficient documentation

## 2015-09-17 DIAGNOSIS — I442 Atrioventricular block, complete: Secondary | ICD-10-CM | POA: Diagnosis not present

## 2015-09-17 DIAGNOSIS — I480 Paroxysmal atrial fibrillation: Secondary | ICD-10-CM

## 2015-09-17 DIAGNOSIS — I5032 Chronic diastolic (congestive) heart failure: Secondary | ICD-10-CM | POA: Insufficient documentation

## 2015-09-17 DIAGNOSIS — Z79899 Other long term (current) drug therapy: Secondary | ICD-10-CM | POA: Diagnosis not present

## 2015-09-17 LAB — BASIC METABOLIC PANEL
Anion gap: 8 (ref 5–15)
BUN: 23 mg/dL — AB (ref 6–20)
CALCIUM: 9.4 mg/dL (ref 8.9–10.3)
CO2: 27 mmol/L (ref 22–32)
CREATININE: 0.89 mg/dL (ref 0.61–1.24)
Chloride: 107 mmol/L (ref 101–111)
GFR calc non Af Amer: >60 mL/min (ref >60)
Glucose, Bld: 94 mg/dL (ref 65–99)
Potassium: 4 mmol/L (ref 3.5–5.1)
Sodium: 142 mmol/L (ref 135–145)

## 2015-09-17 NOTE — Patient Instructions (Signed)
Continue weighing daily and call for an overnight weight gain of > 2 pounds or a weekly weight gain of >5 pounds. 

## 2015-09-17 NOTE — Progress Notes (Signed)
Subjective:    Patient ID: Matthew Howe, male    DOB: Sep 20, 1946, 69 y.o.   MRN: [id number]  Congestive Heart Failure Presents for follow-up visit. The disease course has been improving. Associated symptoms include abdominal pain, edema, fatigue (better) and shortness of breath (only with indigestion). Pertinent negatives include no chest pain, orthopnea or palpitations. The symptoms have been improving. Past treatments include ACE inhibitors, beta blockers, salt and fluid restriction and exercise. The treatment provided moderate relief. Compliance with prior treatments has been good. His past medical history is significant for arrhythmia and HTN. He has multiple 1st degree relatives with heart disease. Compliance with total regimen is 76-100%.  Hypertension This is a chronic problem. The current episode started more than 1 year ago. The problem has been gradually improving since onset. The problem is controlled. Associated symptoms include headaches (very little), peripheral edema (at times) and shortness of breath (only with indigestion). Pertinent negatives include no chest pain, neck pain or palpitations. There are no associated agents to hypertension. Risk factors for coronary artery disease include dyslipidemia and male gender. Past treatments include beta blockers, calcium channel blockers, ACE inhibitors, diuretics and lifestyle changes. The current treatment provides moderate improvement. There are no compliance problems.  Hypertensive end-organ damage includes heart failure.    Past Medical History  Diagnosis Date  . Hyperlipidemia   . Hypertension   . GERD (gastroesophageal reflux disease)   . Coronary artery disease   . Parkinson's disease ([place])   . Complete heart block ([place])     a. s/p St. Jude PPM 11/2014.  [name] AICD (automatic cardioverter/defibrillator) present   . Presence of permanent cardiac pacemaker   . Heart murmur   . CHF (congestive heart failure) ([place])   . Shortness of  breath dyspnea     Past Surgical History  Procedure Laterality Date  . Colonoscopy    . Upper gi endoscopy    . Cardiac catheterization  11/20/2012    Known CAD with one vessel coronary disease of left descending artery by cardiac cath.   . Appendectomy  12/2010  . Pacemaker insertion  11-21-14    STJ Assurity dual chamber pacemaker implanted by Dr Rayann Heman for CHB  . Permanent pacemaker insertion N/A 11/21/2014    Procedure: PERMANENT PACEMAKER INSERTION;  Surgeon: Thompson Grayer, MD;  Location: [facility] CATH LAB;  Service: Cardiovascular;  Laterality: N/A;  . Insert / replace / remove pacemaker      Family History  Problem Relation Age of Onset  . Heart disease Mother   . Heart disease Father   . Colon cancer Father   . Stroke Sister     Social History  Substance Use Topics  . Smoking status: Never Smoker   . Smokeless tobacco: Not on file  . Alcohol Use: No    Allergies  Allergen Reactions  . Codeine   . Norco [Hydrocodone-Acetaminophen] Diarrhea and Nausea And Vomiting  . Omeprazole Diarrhea and Nausea And Vomiting  . Requip  [Ropinirole] Nausea Only    Prior to Admission medications   Medication Sig Start Date End Date Taking? Authorizing Provider  amLODipine (NORVASC) 5 MG tablet Take 5 mg by mouth daily.   Yes Historical Provider, MD  apixaban (ELIQUIS) 5 MG TABS tablet Take 1 tablet (5 mg total) by mouth 2 (two) times daily. 01/16/15  Yes Minna Merritts, MD  carvedilol (COREG) 6.25 MG tablet Take 1 tablet (6.25 mg total) by mouth 2 (two) times daily with a meal. 08/24/15  Yes Alisa Graff, FNP  desloratadine (CLARINEX) 5 MG tablet Take 5 mg by mouth daily.   Yes Historical Provider, MD  dicyclomine (BENTYL) 20 MG tablet 20 mg 4 (four) times daily.  04/04/13  Yes Historical Provider, MD  fluticasone (FLONASE) 50 MCG/ACT nasal spray Place 2 sprays into the nose daily.   Yes Historical Provider, MD  furosemide (LASIX) 20 MG tablet Take 1 tablet (20 mg total) by mouth 2 (two)  times daily as needed. 09/01/15  Yes Alisa Graff, FNP  lansoprazole (PREVACID) 15 MG capsule Take 15 mg by mouth as needed.   Yes Historical Provider, MD  Linaclotide Rolan Lipa) 145 MCG CAPS capsule Take 1 capsule (145 mcg total) by mouth daily. 06/16/15  Yes Lucilla Lame, MD  lisinopril (PRINIVIL,ZESTRIL) 40 MG tablet Take 1 tablet (40 mg total) by mouth daily. 08/11/15  Yes Minna Merritts, MD  magnesium hydroxide (MILK OF MAGNESIA) 400 MG/5ML suspension Take 30 mLs by mouth daily as needed for mild constipation.   Yes Historical Provider, MD  potassium chloride SA (K-DUR,KLOR-CON) 20 MEQ tablet Take 20 mEq by mouth 2 (two) times daily as needed.    Yes Historical Provider, MD  pramipexole (MIRAPEX) 0.75 MG tablet Take 0.75 mg by mouth 1 day or 1 dose.   Yes Historical Provider, MD  selegiline (ELDEPRYL) 5 MG tablet Take 5 mg by mouth 2 (two) times daily with a meal.    Yes Historical Provider, MD  simethicone (MYLICON) 80 MG chewable tablet Takes 3 tablets by mouth daily.   Yes Historical Provider, MD  simvastatin (ZOCOR) 10 MG tablet Take 1 tablet (10 mg total) by mouth daily. 07/29/15  Yes Minna Merritts, MD     Review of Systems  Constitutional: Positive for fatigue (better). Negative for appetite change.  HENT: Negative for congestion, rhinorrhea and sore throat.   Eyes: Negative.   Respiratory: Positive for shortness of breath (only with indigestion). Negative for cough and chest tightness.   Cardiovascular: Positive for leg swelling. Negative for chest pain and palpitations.  Gastrointestinal: Positive for abdominal pain. Negative for abdominal distention.       Indigestion   Endocrine: Negative.   Genitourinary: Negative.   Musculoskeletal: Negative for back pain and neck pain.  Skin: Negative.   Allergic/Immunologic: Negative.   Neurological: Positive for tremors (hands) and headaches (very little). Negative for dizziness and light-headedness.  Hematological: Negative for  adenopathy. Bruises/bleeds easily.  Psychiatric/Behavioral: Positive for dysphoric mood. Negative for sleep disturbance (sleeping on 2 pillows). The patient is not nervous/anxious.        Objective:   Physical Exam  Constitutional: He is oriented to person, place, and time. He appears well-developed and well-nourished.  HENT:  Head: Normocephalic and atraumatic.  Eyes: Conjunctivae are normal. Pupils are equal, round, and reactive to light.  Neck: Neck supple.  Cardiovascular: Regular rhythm.  Bradycardia present.   Pulmonary/Chest: Effort normal. He has no wheezes. He has no rales.  Abdominal: Soft. He exhibits no distension. There is no tenderness.  Musculoskeletal: He exhibits edema (trace amount bilateral ankles). He exhibits no tenderness.  Neurological: He is alert and oriented to person, place, and time.  Skin: Skin is warm and dry.  Psychiatric: He has a normal mood and affect. His behavior is normal. Thought content normal.  Nursing note and vitals reviewed.   BP 149/67 mmHg  Pulse 58  Resp 18  Ht 6\' 1"  (1.854 m)  Wt 220 lb (99.791 kg)  BMI 29.03  kg/m2  SpO2 99%       Assessment & Plan:  1: Chronic heart failure with preserved ejection fraction- Patient presents with fatigue that is getting better and a mild amount of edema. Does get short of breath but he feels like he only gets that way when he gets indigestion. He continues to weigh himself daily and his weight chart was reviewed and shows a home weight between 216-218 pounds. By our scale, he has gained 3 pounds in the last month. He does take his fluid pill daily and will take an extra dose daily depending on his weight and edema. He is not adding any salt to his food and is using Mrs. Dash instead. Tries to read food labels as well. Does see his cardiologist on 09/21/15. 2: HTN- Blood pressure has improved from the last time he was here. He has resumed his amlodipine and hasn't had many leg cramps. Will check a basic  metabolic panel today to evaluate potassium and renal function since lisinopril was also increased.  3: Atrial fibrillation- Currently rate controlled at this time. Continues on amlodipine, eliquis and carvedilol. Sees his cardiologist on 09/21/15. 4: Bradycardia- Heart rate lower than last time but he's added the amlodipine back. Should his heart rate remain upper 50's, he may need a reduction in amlodipine or carvedilol dose.   Return in 3 months or sooner for any questions/problems before then.

## 2015-09-21 ENCOUNTER — Ambulatory Visit (INDEPENDENT_AMBULATORY_CARE_PROVIDER_SITE_OTHER): Payer: Medicare Other | Admitting: Cardiovascular Disease

## 2015-09-21 ENCOUNTER — Encounter: Payer: Self-pay | Admitting: Cardiovascular Disease

## 2015-09-21 VITALS — BP 140/68 | HR 60 | Ht 73.0 in | Wt 224.8 lb

## 2015-09-21 DIAGNOSIS — I10 Essential (primary) hypertension: Secondary | ICD-10-CM | POA: Diagnosis not present

## 2015-09-21 DIAGNOSIS — I340 Nonrheumatic mitral (valve) insufficiency: Secondary | ICD-10-CM

## 2015-09-21 DIAGNOSIS — I480 Paroxysmal atrial fibrillation: Secondary | ICD-10-CM

## 2015-09-21 DIAGNOSIS — I5032 Chronic diastolic (congestive) heart failure: Secondary | ICD-10-CM

## 2015-09-21 DIAGNOSIS — E785 Hyperlipidemia, unspecified: Secondary | ICD-10-CM

## 2015-09-21 DIAGNOSIS — I2510 Atherosclerotic heart disease of native coronary artery without angina pectoris: Secondary | ICD-10-CM | POA: Diagnosis not present

## 2015-09-21 NOTE — Patient Instructions (Addendum)
You are doing well. No medication changes were made.  Please confirm you are taking diltiazem (Calcium channel blocker)  Amlodipine (also a Calcium channel blocker) is similar to diltiazem (diltiazem is better for atrial fibrillation) Amlodipine can cause leg swelling  One option is to keep things as they are as blood pressure is doing fine Other option would be to change amlodipine to something else (if leg swelling gets worse)  Please call us if you have new issues that need to be addressed before your next appt.  Your physician wants you to follow-up in: 6 months.  You will receive a reminder letter in the mail two months in advance. If you don't receive a letter, please call our office to schedule the follow-up appointment.

## 2015-09-21 NOTE — Progress Notes (Signed)
Patient ID: Matthew Howe, male    DOB: 10-17-1945, 69 y.o.   MRN: [id number]  HPI Comments: Mr. Perel is a very pleasant 69 year old gentleman with diagnosis of Parkinson's who currently works delivering car parts, history of hypertension,  cardiac catheterization showing mild CAD after stress test at outpatient facility showed lateral wall hypokinesis on stress echo, hyperlipidemia, s/p  pacemaker placement. Found to have atrial fibrillation 11/22/2014 on pacemaker download, started on anticoagulation  prior cardiac catheterization February 2014 with nonobstructive disease He presents today for routine follow-up of his paroxysmal atrial fibrillation recent hospitalization for acute on chronic diastolic CHF  He presented to the hospital 07/28/2015 with abdominal bloating, shortness of breath Was treated with diureticswith improvement of his symptoms Encouraged to take Lasix daily In follow-up today, he's been taking Lasix twice a day Reports having mild leg edema, improved withcompression hose. Reports he is on his feet for hours at a time Reports there was some pill that he did not tolerate and he stopped this. Unclear which medication this was Blood pressure was running high so he restarted amlodipine.This was previously stopped as he was started on diltiazem for atrial fibrillation With the amlodipine 5 mg daily in addition to diltiazem, blood pressure improved  eKG on today's visit shows paced rhythm  Other past medical history  leads a busy lifestyle, continues to work most days of the week. Fatigue, tremor Followed by neurology, Dr. Melrose Nakayama for Parkinson's Previous issues with constipation  He presented to [facility] with bradycardia February 2016. This seemed to improve without intervention and he is asymptomatic. He had recurrent symptoms and we referred him to [facility] for further evaluation. He was seen in the emergency room and found to be in heart block, pacemaker placed urgently    Previous Stress echo leading to cardiac catheterization at [facility].  Stress echo was a limited study, ejection fraction estimated at 45% with EF of 50% at peak stress.  Cardiac catheterization 11/20/2012 with 25% proximal LAD disease, followed by 40% lesion. The cardiac catheterization note makes mention of atrial flutter though arrhythmia is not mentioned in any of the clinic notes.  Previous Echocardiogram shows ejection fraction 50-55%, mild to moderate aortic valve insufficiency, otherwise normal study.  Abdominal CT 12/22/2010 showed acute appendicitis, colonic diverticulosis    Allergies  Allergen Reactions  . Codeine   . Norco [Hydrocodone-Acetaminophen] Diarrhea and Nausea And Vomiting  . Omeprazole Diarrhea and Nausea And Vomiting  . Requip  [Ropinirole] Nausea Only    Outpatient Encounter Prescriptions as of 09/21/2015  Medication Sig  . amLODipine (NORVASC) 5 MG tablet Take 5 mg by mouth daily.  [name] apixaban (ELIQUIS) 5 MG TABS tablet Take 1 tablet (5 mg total) by mouth 2 (two) times daily.  . carvedilol (COREG) 6.25 MG tablet Take 1 tablet (6.25 mg total) by mouth 2 (two) times daily with a meal.  . desloratadine (CLARINEX) 5 MG tablet Take 5 mg by mouth daily.  [name] dicyclomine (BENTYL) 20 MG tablet 20 mg 4 (four) times daily.   . fluticasone (FLONASE) 50 MCG/ACT nasal spray Place 2 sprays into the nose daily.  . furosemide (LASIX) 20 MG tablet Take 1 tablet (20 mg total) by mouth 2 (two) times daily as needed.  . lansoprazole (PREVACID) 15 MG capsule Take 15 mg by mouth as needed.  . Linaclotide (LINZESS) 145 MCG CAPS capsule Take 1 capsule (145 mcg total) by mouth daily.  [name] lisinopril (PRINIVIL,ZESTRIL) 40 MG tablet Take 1 tablet (40 mg total)  by mouth daily.  . magnesium hydroxide (MILK OF MAGNESIA) 400 MG/5ML suspension Take 30 mLs by mouth daily as needed for mild constipation.  . potassium chloride SA (K-DUR,KLOR-CON) 20 MEQ tablet Take 20 mEq by mouth 2 (two)  times daily as needed.   . pramipexole (MIRAPEX) 0.75 MG tablet Take 0.75 mg by mouth 1 day or 1 dose.  . selegiline (ELDEPRYL) 5 MG tablet Take 5 mg by mouth 2 (two) times daily with a meal.   . simethicone (MYLICON) 80 MG chewable tablet Takes 3 tablets by mouth daily.  . simvastatin (ZOCOR) 10 MG tablet Take 1 tablet (10 mg total) by mouth daily.   No facility-administered encounter medications on file as of 09/21/2015.    Past Medical History  Diagnosis Date  . Hyperlipidemia   . Hypertension   . GERD (gastroesophageal reflux disease)   . Coronary artery disease   . Parkinson's disease ([place])   . Complete heart block ([place])     a. s/p St. Jude PPM 11/2014.  [name] AICD (automatic cardioverter/defibrillator) present   . Presence of permanent cardiac pacemaker   . Heart murmur   . CHF (congestive heart failure) ([place])   . Shortness of breath dyspnea     Past Surgical History  Procedure Laterality Date  . Colonoscopy    . Upper gi endoscopy    . Cardiac catheterization  11/20/2012    Known CAD with one vessel coronary disease of left descending artery by cardiac cath.   . Appendectomy  12/2010  . Pacemaker insertion  11-21-14    STJ Assurity dual chamber pacemaker implanted by Dr Rayann Heman for CHB  . Permanent pacemaker insertion N/A 11/21/2014    Procedure: PERMANENT PACEMAKER INSERTION;  Surgeon: Thompson Grayer, MD;  Location: [facility] CATH LAB;  Service: Cardiovascular;  Laterality: N/A;  . Insert / replace / remove pacemaker      Social History  reports that he has never smoked. He does not have any smokeless tobacco history on file. He reports that he does not drink alcohol or use illicit drugs.  Family History family history includes Colon cancer in his father; Heart disease in his father and mother; Stroke in his sister.   Review of Systems  Constitutional: Negative.   Respiratory: Negative.   Cardiovascular: Negative.   Gastrointestinal: Negative.   Musculoskeletal: Negative.    Skin: Negative.   Neurological: Negative.   Hematological: Negative.   Psychiatric/Behavioral: Negative.   All other systems reviewed and are negative.   BP 140/68 mmHg  Pulse 60  Ht 6\' 1"  (1.854 m)  Wt 224 lb 12 oz (101.946 kg)  BMI 29.66 kg/m2  Physical Exam  Constitutional: He is oriented to person, place, and time. He appears well-developed and well-nourished.  HENT:  Head: Normocephalic.  Nose: Nose normal.  Mouth/Throat: Oropharynx is clear and moist.  Eyes: Conjunctivae are normal. Pupils are equal, round, and reactive to light.  Neck: Normal range of motion. Neck supple. No JVD present.  Cardiovascular: Normal rate, regular rhythm, S1 normal, S2 normal and intact distal pulses.  Exam reveals no gallop and no friction rub.   Murmur heard.  Decrescendo systolic murmur is present with a grade of 2/6  Pulmonary/Chest: Effort normal and breath sounds normal. No respiratory distress. He has no wheezes. He has no rales. He exhibits no tenderness.  Abdominal: Soft. Bowel sounds are normal. He exhibits no distension. There is no tenderness.  Musculoskeletal: Normal range of motion. He exhibits no edema or tenderness.  Lymphadenopathy:    He has no cervical adenopathy.  Neurological: He is alert and oriented to person, place, and time. Coordination normal.  Skin: Skin is warm and dry. No rash noted. No erythema.  Psychiatric: He has a normal mood and affect. His behavior is normal. Judgment and thought content normal.      Assessment and Plan   Nursing note and vitals reviewed.

## 2015-09-21 NOTE — Assessment & Plan Note (Addendum)
encouraged him to stay on his diltiazem. Tolerating anticoagulation If pacer download shows continued paroxysmal atrial fibrillation, could increase diltiazem, hold the amlodipine. He started the latter on his own

## 2015-09-21 NOTE — Assessment & Plan Note (Signed)
Currently taking Lasix 20 minute grams twice a day BUN borderline elevated. Will monitor for now, overall he feels well

## 2015-09-21 NOTE — Assessment & Plan Note (Signed)
Blood pressure  is reasonably well controlled today We did discuss the amlodipine and diltiazem. He feels comfortable taking both for now. He will confirm his medications with his family as he does not have an accurate list. Denies having worsening leg swelling. If pacer download shows more atrial fibrillation, we could increase the diltiazem, hold the amlodipine

## 2015-09-21 NOTE — Assessment & Plan Note (Signed)
Encouraged him to stay on his simvastatin. 

## 2015-09-21 NOTE — Assessment & Plan Note (Signed)
Currently with no symptoms of angina. No further workup at this time. Continue current medication regimen. 

## 2015-09-23 ENCOUNTER — Encounter: Payer: Self-pay | Admitting: Cardiology

## 2015-09-29 ENCOUNTER — Other Ambulatory Visit: Payer: Self-pay

## 2015-09-29 ENCOUNTER — Telehealth: Payer: Self-pay | Admitting: *Deleted

## 2015-09-29 MED ORDER — APIXABAN 5 MG PO TABS: 5.0000 mg | ORAL_TABLET | Freq: Two times a day (BID) | ORAL | Status: DC

## 2015-09-29 NOTE — Telephone Encounter (Signed)
Pt calling to update med list after ov 09/21/15. He would like to stay on amlodipine, as it seems to be working for him.

## 2015-09-29 NOTE — Telephone Encounter (Signed)
Refill sent for Eliquis 5 mg  

## 2015-09-29 NOTE — Telephone Encounter (Signed)
Pt wife just calling to let us know pt used to take Diltiazem. But stopped that for he would get "sick as a dog"  Now pt is taking amlodipine The only thing with that medication is that it give patient legs cramps. But it has not given him any issues yet. Just wanted to call and let us know if we have any questions to call them back.

## 2015-09-30 ENCOUNTER — Other Ambulatory Visit: Payer: Self-pay | Admitting: *Deleted

## 2015-09-30 MED ORDER — APIXABAN 5 MG PO TABS: 5.0000 mg | ORAL_TABLET | Freq: Two times a day (BID) | ORAL | Status: DC

## 2015-10-07 ENCOUNTER — Other Ambulatory Visit: Payer: Self-pay | Admitting: Family

## 2015-10-07 ENCOUNTER — Telehealth: Payer: Self-pay | Admitting: Family

## 2015-10-07 NOTE — Telephone Encounter (Signed)
Patient called to say that the amlodipine was working pretty good for his blood pressure but was causing cramps. He says that diltiazem caused abdominal pain/cramping. Has also tried metoprolol in the past. Advised him to stop the amlodipine and increase his carvedilol to 12.5mg  twice daily. Instructed his wife to give him 2 of the 6.25mg  tablets twice daily. If this helps with his blood pressure, she is to let us know so that a new prescription for the 12.5mg  dosage can be called in.

## 2015-10-20 ENCOUNTER — Other Ambulatory Visit: Payer: Self-pay

## 2015-10-20 DIAGNOSIS — K5900 Constipation, unspecified: Secondary | ICD-10-CM

## 2015-10-20 MED ORDER — LINACLOTIDE 145 MCG PO CAPS: 145.0000 ug | ORAL_CAPSULE | Freq: Every day | ORAL | Status: DC

## 2015-11-30 NOTE — Progress Notes (Signed)
Patient Care Team: Adrian Prows, MD as PCP - General (Infectious Diseases) Alisa Graff, FNP as Nurse Practitioner (Cardiology) Minna Merritts, MD as Consulting Physician (Cardiology) Anabel Bene, MD as Referring Physician (Neurology)   HPI  Matthew Howe is a 70 y.o. male Seen following pacemaker implantation 2/16 by Dr. Greggory Brandy . He had presented to [facility] with chest pain and shortness of breath. He was found to be in complete heart block. He had an antecedent history of bifascicular block   Echocardiogram  Repeated 10/16 demonstrated normal LV function    Catheterization 2/14 had demonstrated mild nonobstructive disease.  He also has Parkinson's he continues to push through it.  He has some problems with edema. He drinks fluids copiously. He takes his when necessary second dose of diuretic at bedtime.  He has fatigue. He does not have daytime somnolence. He does not snore     Past Medical History  Diagnosis Date  . Hyperlipidemia   . Hypertension   . GERD (gastroesophageal reflux disease)   . Coronary artery disease   . Parkinson's disease ([place])   . Complete heart block ([place])     a. s/p St. Jude PPM 11/2014.  [name] AICD (automatic cardioverter/defibrillator) present   . Presence of permanent cardiac pacemaker   . Heart murmur   . CHF (congestive heart failure) ([place])   . Shortness of breath dyspnea     Past Surgical History  Procedure Laterality Date  . Colonoscopy    . Upper gi endoscopy    . Cardiac catheterization  11/20/2012    Known CAD with one vessel coronary disease of left descending artery by cardiac cath.   . Appendectomy  12/2010  . Pacemaker insertion  11-21-14    STJ Assurity dual chamber pacemaker implanted by Dr Rayann Heman for CHB  . Permanent pacemaker insertion N/A 11/21/2014    Procedure: PERMANENT PACEMAKER INSERTION;  Surgeon: Thompson Grayer, MD;  Location: [facility] CATH LAB;  Service: Cardiovascular;  Laterality: N/A;  . Insert / replace / remove  pacemaker      Current Outpatient Prescriptions  Medication Sig Dispense Refill  . apixaban (ELIQUIS) 5 MG TABS tablet Take 1 tablet (5 mg total) by mouth 2 (two) times daily. 60 tablet 6  . carvedilol (COREG) 6.25 MG tablet Take 1 tablet (6.25 mg total) by mouth 2 (two) times daily with a meal. 60 tablet 5  . desloratadine (CLARINEX) 5 MG tablet Take 5 mg by mouth daily.    [name] dicyclomine (BENTYL) 20 MG tablet 20 mg 4 (four) times daily.     . fluticasone (FLONASE) 50 MCG/ACT nasal spray Place 2 sprays into the nose daily.    . furosemide (LASIX) 20 MG tablet Take 1 tablet (20 mg total) by mouth 2 (two) times daily as needed. 60 tablet 6  . lansoprazole (PREVACID) 15 MG capsule Take 15 mg by mouth as needed.    . Linaclotide (LINZESS) 145 MCG CAPS capsule Take 1 capsule (145 mcg total) by mouth daily. 30 capsule 6  . lisinopril (PRINIVIL,ZESTRIL) 40 MG tablet Take 1 tablet (40 mg total) by mouth daily. 30 tablet 6  . magnesium hydroxide (MILK OF MAGNESIA) 400 MG/5ML suspension Take 30 mLs by mouth daily as needed for mild constipation.    . potassium chloride SA (K-DUR,KLOR-CON) 20 MEQ tablet Take 20 mEq by mouth 2 (two) times daily as needed.     . pramipexole (MIRAPEX) 0.75 MG tablet Take 0.75 mg by  mouth 1 day or 1 dose.    . selegiline (ELDEPRYL) 5 MG tablet Take 5 mg by mouth 2 (two) times daily with a meal.     . simethicone (MYLICON) 80 MG chewable tablet Takes 3 tablets by mouth daily.    . simvastatin (ZOCOR) 10 MG tablet Take 1 tablet (10 mg total) by mouth daily. 30 tablet 6   No current facility-administered medications for this visit.    Allergies  Allergen Reactions  . Codeine   . Norco [Hydrocodone-Acetaminophen] Diarrhea and Nausea And Vomiting  . Omeprazole Diarrhea and Nausea And Vomiting  . Requip  [Ropinirole] Nausea Only    Review of Systems negative except from HPI and PMH  Physical Exam BP 140/80 mmHg  Pulse 62  Ht 6\' 1"  (1.854 m)  Wt 235 lb (106.595 kg)   BMI 31.01 kg/m2 Well developed and well nourished in no acute distress HENT normal E scleral and icterus clear Neck Supple JVP flat; carotids brisk and full Clear to ausculation Device pocket well healed; without hematoma or erythema.  There is no tethering Regular rate and rhythm, no murmurs gallops or rub Soft with active bowel sounds No clubbing cyanosis  Edema Alert and oriented, grossy normal motor and sensory function Skin Warm and Dry  lECG  reviewed from December 2016 sinus rhythm at 60 P synchronous RV apical pacing   Assessment and  Plan  Complete heart block  Pacemaker-St. Jude  Parkinson's disease  Hypertension    Blood pressure is reasonably controlled.  We have reviewed the physiology of HFpEF and is encouraged him to decrease his by mouth fluid intake.  Device function is normal; was not reprogrammed

## 2015-12-01 ENCOUNTER — Encounter: Payer: Self-pay | Admitting: Internal Medicine

## 2015-12-01 ENCOUNTER — Ambulatory Visit (INDEPENDENT_AMBULATORY_CARE_PROVIDER_SITE_OTHER): Payer: Medicare Other | Admitting: Internal Medicine

## 2015-12-01 VITALS — BP 140/80 | HR 62 | Ht 73.0 in | Wt 235.0 lb

## 2015-12-01 DIAGNOSIS — I480 Paroxysmal atrial fibrillation: Secondary | ICD-10-CM | POA: Diagnosis not present

## 2015-12-01 DIAGNOSIS — I5030 Unspecified diastolic (congestive) heart failure: Secondary | ICD-10-CM | POA: Diagnosis not present

## 2015-12-01 DIAGNOSIS — I442 Atrioventricular block, complete: Secondary | ICD-10-CM | POA: Diagnosis not present

## 2015-12-01 DIAGNOSIS — I10 Essential (primary) hypertension: Secondary | ICD-10-CM

## 2015-12-01 NOTE — Patient Instructions (Signed)
Medication Instructions: - Your physician recommends that you continue on your current medications as directed. Please refer to the Current Medication list given to you today.  Labwork: - none  Procedures/Testing: - none  Follow-Up: - Remote monitoring is used to monitor your Pacemaker of ICD from home. This monitoring reduces the number of office visits required to check your device to one time per year. It allows [place] to keep an eye on the functioning of your device to ensure it is working properly. You are scheduled for a device check from home on 03/01/16. You may send your transmission at any time that day. If you have a wireless device, the transmission will be sent automatically. After your physician reviews your transmission, you will receive a postcard with your next transmission date.  - Your physician wants you to follow-up in: 1 year with Dr. Caryl Comes. You will receive a reminder letter in the mail two months in advance. If you don't receive a letter, please call our office to schedule the follow-up appointment.  Any Additional Special Instructions Will Be Listed Below (If Applicable).     If you need a refill on your cardiac medications before your next appointment, please call your pharmacy.

## 2015-12-02 LAB — CUP PACEART INCLINIC DEVICE CHECK
Implantable Lead Implant Date: 20160212
Implantable Lead Location: 753859
Implantable Lead Location: 753860
Implantable Lead Model: 1948
MDC IDC LEAD IMPLANT DT: 20160212
MDC IDC PG SERIAL: 3049931
MDC IDC SESS DTM: 20170222161451

## 2015-12-14 ENCOUNTER — Other Ambulatory Visit: Payer: Self-pay | Admitting: Family

## 2015-12-14 MED ORDER — AMLODIPINE BESYLATE 5 MG PO TABS: 5.0000 mg | ORAL_TABLET | Freq: Every day | ORAL | Status: DC

## 2015-12-16 ENCOUNTER — Ambulatory Visit: Payer: Medicare Other | Admitting: Family

## 2015-12-24 ENCOUNTER — Other Ambulatory Visit: Payer: Self-pay | Admitting: Family

## 2015-12-24 ENCOUNTER — Encounter: Payer: Self-pay | Admitting: Family

## 2015-12-24 ENCOUNTER — Ambulatory Visit: Payer: Medicare Other | Attending: Family | Admitting: Family

## 2015-12-24 VITALS — BP 137/45 | HR 58 | Resp 20 | Ht 73.0 in | Wt 237.0 lb

## 2015-12-24 DIAGNOSIS — G20 Parkinson's disease: Secondary | ICD-10-CM | POA: Diagnosis not present

## 2015-12-24 DIAGNOSIS — I480 Paroxysmal atrial fibrillation: Secondary | ICD-10-CM

## 2015-12-24 DIAGNOSIS — Z950 Presence of cardiac pacemaker: Secondary | ICD-10-CM | POA: Insufficient documentation

## 2015-12-24 DIAGNOSIS — Z7901 Long term (current) use of anticoagulants: Secondary | ICD-10-CM | POA: Diagnosis not present

## 2015-12-24 DIAGNOSIS — I2510 Atherosclerotic heart disease of native coronary artery without angina pectoris: Secondary | ICD-10-CM | POA: Diagnosis not present

## 2015-12-24 DIAGNOSIS — I5032 Chronic diastolic (congestive) heart failure: Secondary | ICD-10-CM | POA: Insufficient documentation

## 2015-12-24 DIAGNOSIS — K219 Gastro-esophageal reflux disease without esophagitis: Secondary | ICD-10-CM | POA: Insufficient documentation

## 2015-12-24 DIAGNOSIS — I10 Essential (primary) hypertension: Secondary | ICD-10-CM | POA: Diagnosis not present

## 2015-12-24 DIAGNOSIS — Z79899 Other long term (current) drug therapy: Secondary | ICD-10-CM | POA: Insufficient documentation

## 2015-12-24 DIAGNOSIS — I442 Atrioventricular block, complete: Secondary | ICD-10-CM | POA: Insufficient documentation

## 2015-12-24 DIAGNOSIS — Z95810 Presence of automatic (implantable) cardiac defibrillator: Secondary | ICD-10-CM | POA: Diagnosis not present

## 2015-12-24 DIAGNOSIS — E785 Hyperlipidemia, unspecified: Secondary | ICD-10-CM | POA: Insufficient documentation

## 2015-12-24 DIAGNOSIS — I4891 Unspecified atrial fibrillation: Secondary | ICD-10-CM | POA: Insufficient documentation

## 2015-12-24 DIAGNOSIS — Z888 Allergy status to other drugs, medicaments and biological substances status: Secondary | ICD-10-CM | POA: Insufficient documentation

## 2015-12-24 DIAGNOSIS — Z9889 Other specified postprocedural states: Secondary | ICD-10-CM | POA: Insufficient documentation

## 2015-12-24 MED ORDER — CARVEDILOL 6.25 MG PO TABS
6.2500 mg | ORAL_TABLET | Freq: Two times a day (BID) | ORAL | Status: DC
Start: 2015-12-24 — End: 2016-05-17

## 2015-12-24 MED ORDER — NORVASC 5 MG PO TABS: 5.0000 mg | ORAL_TABLET | Freq: Every day | ORAL | Status: DC

## 2015-12-24 NOTE — Patient Instructions (Addendum)
Continue weighing daily and call for an overnight weight gain of > 2 pounds or a weekly weight gain of >5 pounds.  Increase carvedilol to 1 tablet (6.25mg ) in the morning and continue 2 tablets in the evening. If unable to get the amlodipine from the pharmacy that you can tolerate, we can consider doing name brand Norvasc or possibly increase the morning carvedilol dose instead.

## 2015-12-24 NOTE — Progress Notes (Signed)
Subjective:    Patient ID: Matthew Howe, male    DOB: 1946/08/20, 70 y.o.   MRN: [id number]  Congestive Heart Failure Presents for follow-up visit. The disease course has been stable. Associated symptoms include chest pressure (only after taking a different generic amlodipine), edema, fatigue and shortness of breath. Pertinent negatives include no abdominal pain, chest pain, orthopnea or palpitations. The symptoms have been stable. Past treatments include ACE inhibitors, beta blockers and salt and fluid restriction. The treatment provided moderate relief. Compliance with prior treatments has been good. His past medical history is significant for CAD and HTN. There is no history of CVA or DM. He has multiple 1st degree relatives with heart disease.  Hypertension This is a chronic problem. The current episode started more than 1 year ago. The problem is unchanged. The problem is controlled. Associated symptoms include malaise/fatigue, peripheral edema and shortness of breath. Pertinent negatives include no chest pain, headaches, neck pain or palpitations. There are no associated agents to hypertension. Risk factors for coronary artery disease include family history, male gender and dyslipidemia. Past treatments include ACE inhibitors, beta blockers, calcium channel blockers, diuretics and lifestyle changes. The current treatment provides significant improvement. There are no compliance problems.  Hypertensive end-organ damage includes CAD/MI and heart failure.   Past Medical History  Diagnosis Date  . Hyperlipidemia   . Hypertension   . GERD (gastroesophageal reflux disease)   . Coronary artery disease   . Parkinson's disease ([place])   . Complete heart block ([place])     a. s/p St. Jude PPM 11/2014.  [name] AICD (automatic cardioverter/defibrillator) present   . Presence of permanent cardiac pacemaker   . Heart murmur   . CHF (congestive heart failure) ([place])   . Shortness of breath dyspnea     Past  Surgical History  Procedure Laterality Date  . Colonoscopy    . Upper gi endoscopy    . Cardiac catheterization  11/20/2012    Known CAD with one vessel coronary disease of left descending artery by cardiac cath.   . Appendectomy  12/2010  . Pacemaker insertion  11-21-14    STJ Assurity dual chamber pacemaker implanted by Dr Rayann Heman for CHB  . Permanent pacemaker insertion N/A 11/21/2014    Procedure: PERMANENT PACEMAKER INSERTION;  Surgeon: Thompson Grayer, MD;  Location: [facility] CATH LAB;  Service: Cardiovascular;  Laterality: N/A;  . Insert / replace / remove pacemaker      Family History  Problem Relation Age of Onset  . Heart disease Mother   . Heart disease Father   . Colon cancer Father   . Stroke Sister     Social History  Substance Use Topics  . Smoking status: Never Smoker   . Smokeless tobacco: Never Used  . Alcohol Use: No    Allergies  Allergen Reactions  . Codeine   . Norco [Hydrocodone-Acetaminophen] Diarrhea and Nausea And Vomiting  . Omeprazole Diarrhea and Nausea And Vomiting  . Requip  [Ropinirole] Nausea Only    Prior to Admission medications   Medication Sig Start Date End Date Taking? Authorizing Provider  amLODipine (NORVASC) 5 MG tablet Take 1 tablet (5 mg total) by mouth daily. 12/14/15  Yes Alisa Graff, FNP  apixaban (ELIQUIS) 5 MG TABS tablet Take 1 tablet (5 mg total) by mouth 2 (two) times daily. 09/30/15  Yes Minna Merritts, MD  carvedilol (COREG) 6.25 MG tablet Take 1 tablet (6.25 mg total) by mouth 2 (two) times daily with a  meal. Take 1 tablet in the morning and 2 tablets each evening 12/24/15  Yes Alisa Graff, FNP  desloratadine (CLARINEX) 5 MG tablet Take 5 mg by mouth daily.   Yes Historical Provider, MD  dicyclomine (BENTYL) 20 MG tablet 20 mg 4 (four) times daily.  04/04/13  Yes Historical Provider, MD  fluticasone (FLONASE) 50 MCG/ACT nasal spray Place 2 sprays into the nose daily.   Yes Historical Provider, MD  furosemide (LASIX) 20 MG  tablet Take 1 tablet (20 mg total) by mouth 2 (two) times daily as needed. Patient taking differently: Take 20 mg by mouth 3 (three) times daily as needed.  09/01/15  Yes Alisa Graff, FNP  lansoprazole (PREVACID) 15 MG capsule Take 15 mg by mouth as needed.   Yes Historical Provider, MD  Linaclotide Rolan Lipa) 145 MCG CAPS capsule Take 1 capsule (145 mcg total) by mouth daily. 10/20/15  Yes Lucilla Lame, MD  lisinopril (PRINIVIL,ZESTRIL) 40 MG tablet Take 1 tablet (40 mg total) by mouth daily. 08/11/15  Yes Minna Merritts, MD  magnesium hydroxide (MILK OF MAGNESIA) 400 MG/5ML suspension Take 30 mLs by mouth daily as needed for mild constipation.   Yes Historical Provider, MD  polyethylene glycol (MIRALAX / GLYCOLAX) packet Take 17 g by mouth daily as needed.   Yes Historical Provider, MD  potassium chloride SA (K-DUR,KLOR-CON) 20 MEQ tablet Take 20 mEq by mouth 2 (two) times daily as needed. Reported on 12/24/2015   Yes Historical Provider, MD  pramipexole (MIRAPEX) 0.75 MG tablet Take 0.75 mg by mouth daily.    Yes Historical Provider, MD  Probiotic Product (ALIGN) 4 MG CAPS Take 1 capsule by mouth daily.   Yes Historical Provider, MD  selegiline (ELDEPRYL) 5 MG tablet Take 5 mg by mouth 2 (two) times daily with a meal.    Yes Historical Provider, MD  simethicone (MYLICON) 80 MG chewable tablet Takes 3 tablets by mouth daily.   Yes Historical Provider, MD  simvastatin (ZOCOR) 10 MG tablet Take 1 tablet (10 mg total) by mouth daily. 07/29/15  Yes Minna Merritts, MD      Review of Systems  Constitutional: Positive for malaise/fatigue and fatigue. Negative for appetite change.  HENT: Positive for congestion. Negative for postnasal drip and sore throat.   Eyes: Negative.   Respiratory: Positive for cough (loose), chest tightness (with different amlodipine medications) and shortness of breath.   Cardiovascular: Positive for leg swelling. Negative for chest pain and palpitations.   Gastrointestinal: Negative for abdominal pain and abdominal distention.  Endocrine: Negative.   Genitourinary: Negative.   Musculoskeletal: Positive for back pain. Negative for neck pain.  Skin: Negative.   Allergic/Immunologic: Negative.   Neurological: Negative for dizziness, light-headedness and headaches.  Hematological: Negative for adenopathy. Bruises/bleeds easily.  Psychiatric/Behavioral: Positive for dysphoric mood. Negative for sleep disturbance (not a good sleeper). The patient is not nervous/anxious.        Objective:   Physical Exam  Constitutional: He is oriented to person, place, and time. He appears well-developed and well-nourished.  HENT:  Head: Normocephalic and atraumatic.  Eyes: Conjunctivae are normal. Pupils are equal, round, and reactive to light.  Neck: Normal range of motion. Neck supple.  Cardiovascular: Regular rhythm.  Bradycardia present.   No murmur heard. Pulmonary/Chest: Effort normal. He has no wheezes. He has no rales.  Abdominal: Soft. He exhibits no distension. There is no tenderness.  Musculoskeletal: He exhibits edema. He exhibits no tenderness.  Neurological: He is alert and  oriented to person, place, and time.  Skin: Skin is warm and dry.  Psychiatric: He has a normal mood and affect. His behavior is normal. Thought content normal.  Nursing note and vitals reviewed.   BP 137/45 mmHg  Pulse 58  Resp 20  Ht 6\' 1"  (1.854 m)  Wt 237 lb (107.502 kg)  BMI 31.27 kg/m2  SpO2 100%       Assessment & Plan:  1: Chronic heart failure with preserved ejection fraction- Patient presents with fatigue and shortness of breath upon exertion. He's been working more than usual so feels like some of his increased fatigue is related to that. When he does start to get tired, he will stop what he's doing to rest until his energy level returns. He continues to weigh himself and has noticed a gradual weight gain. By our scale, he's gained 17 pounds since  December 2016. Reminded to call for an overnight weight gain of >2 pounds or a weekly weight gain of >5 pounds. Continues to have some chronic edema in his lower legs that improves overnight. He's only been taking his carvedilol in the evening as he didn't know he needed to also take it in the morning. He will continue taking the 12.5mg  dose in the evening and will add the 6.25mg  in the morning and he will monitor his heart rate.  2: HTN- Blood pressure looks good today. They check it at home so will keep an eye on it. 3: Atrial fibrillation- Currently rate controlled on carvedilol and amlodipine. Also is taking eliquis as well. Regular rhythm today. 4: Parkinson's disease- He had a worsening of his parkinson symptoms after he took a different generic dose of amlodipine. Tremors in his hands were much worse so he didn't take the amlodipine today and doesn't have any tremors.  He's mentioned having difficulty taking generic amlodipine recently. He was given a tablet that had the #238 on it which he tolerated well but since then, he's been given 4 or 5 different generic formulations. When he's taken these, he's developed worsening tremors or chest heaviness which lasts for hours. His wife is going to see if the pharmacy can get the generic tablet with the # on it as they know he can tolerate that (except for occasional cramps). Discussed either prescribing name brand Norvasc or stopping it and titrating up carvedilol if needed. Patient's wife will let me know which direction they would like to take if the pharmacy can't get the tablet with the #238 on it.   Return here in 3 months or sooner for any questions/problems before then.

## 2015-12-28 DIAGNOSIS — I2510 Atherosclerotic heart disease of native coronary artery without angina pectoris: Secondary | ICD-10-CM | POA: Diagnosis not present

## 2015-12-28 DIAGNOSIS — I10 Essential (primary) hypertension: Secondary | ICD-10-CM | POA: Diagnosis not present

## 2015-12-28 DIAGNOSIS — E7800 Pure hypercholesterolemia, unspecified: Secondary | ICD-10-CM | POA: Diagnosis not present

## 2015-12-28 DIAGNOSIS — G20 Parkinson's disease: Secondary | ICD-10-CM | POA: Diagnosis not present

## 2015-12-29 DIAGNOSIS — G20 Parkinson's disease: Secondary | ICD-10-CM | POA: Diagnosis not present

## 2016-01-07 ENCOUNTER — Telehealth: Payer: Self-pay | Admitting: Family

## 2016-01-07 ENCOUNTER — Other Ambulatory Visit: Payer: Self-pay | Admitting: Family

## 2016-01-07 MED ORDER — AMOXICILLIN-POT CLAVULANATE 875-125 MG PO TABS: 1.0000 | ORAL_TABLET | Freq: Two times a day (BID) | ORAL | Status: DC

## 2016-01-07 NOTE — Telephone Encounter (Signed)
Patient called saying that he has been having quite a bit of sinus congestion/drainage, pressure in his head and a low-grade fever. Was asking if an antibiotic could be called in for him. He says that his wife has also been sick. Discussed possible choices and will give augmentin for 10 days. Did tell him that should his symptoms continue, that he needed to make an appointment with his PCP. Patient verbalized understanding.

## 2016-02-01 ENCOUNTER — Emergency Department
Admission: EM | Admit: 2016-02-01 | Discharge: 2016-02-01 | Disposition: A | Discharge status: Home / Self Care | Payer: Medicare Other | Attending: Emergency Medicine | Admitting: Emergency Medicine

## 2016-02-01 ENCOUNTER — Emergency Department: Payer: Medicare Other

## 2016-02-01 ENCOUNTER — Telehealth: Payer: Self-pay | Admitting: Family

## 2016-02-01 ENCOUNTER — Telehealth: Payer: Self-pay | Admitting: Cardiovascular Disease

## 2016-02-01 DIAGNOSIS — E785 Hyperlipidemia, unspecified: Secondary | ICD-10-CM | POA: Insufficient documentation

## 2016-02-01 DIAGNOSIS — G20 Parkinson's disease: Secondary | ICD-10-CM | POA: Insufficient documentation

## 2016-02-01 DIAGNOSIS — I5033 Acute on chronic diastolic (congestive) heart failure: Secondary | ICD-10-CM | POA: Insufficient documentation

## 2016-02-01 DIAGNOSIS — I480 Paroxysmal atrial fibrillation: Secondary | ICD-10-CM | POA: Diagnosis not present

## 2016-02-01 DIAGNOSIS — I509 Heart failure, unspecified: Secondary | ICD-10-CM

## 2016-02-01 DIAGNOSIS — Z888 Allergy status to other drugs, medicaments and biological substances status: Secondary | ICD-10-CM | POA: Insufficient documentation

## 2016-02-01 DIAGNOSIS — I10 Essential (primary) hypertension: Secondary | ICD-10-CM | POA: Diagnosis not present

## 2016-02-01 DIAGNOSIS — Z950 Presence of cardiac pacemaker: Secondary | ICD-10-CM | POA: Insufficient documentation

## 2016-02-01 DIAGNOSIS — Z885 Allergy status to narcotic agent status: Secondary | ICD-10-CM | POA: Diagnosis not present

## 2016-02-01 DIAGNOSIS — I2510 Atherosclerotic heart disease of native coronary artery without angina pectoris: Secondary | ICD-10-CM | POA: Insufficient documentation

## 2016-02-01 DIAGNOSIS — I110 Hypertensive heart disease with heart failure: Secondary | ICD-10-CM | POA: Diagnosis not present

## 2016-02-01 DIAGNOSIS — Z79899 Other long term (current) drug therapy: Secondary | ICD-10-CM | POA: Insufficient documentation

## 2016-02-01 DIAGNOSIS — R0602 Shortness of breath: Secondary | ICD-10-CM | POA: Diagnosis not present

## 2016-02-01 LAB — TROPONIN I
TROPONIN I: 0.04 ng/mL — AB (ref <0.031)
TROPONIN I: 0.04 ng/mL — AB (ref <0.031)

## 2016-02-01 LAB — CBC
HCT: 42 % (ref 40.0–52.0)
Hemoglobin: 14.1 g/dL (ref 13.0–18.0)
MCH: 29.6 pg (ref 26.0–34.0)
MCHC: 33.7 g/dL (ref 32.0–36.0)
MCV: 87.9 fL (ref 80.0–100.0)
PLATELETS: 213 10*3/uL (ref 150–440)
RBC: 4.77 MIL/uL (ref 4.40–5.90)
RDW: 15 % — AB (ref 11.5–14.5)
WBC: 9.1 10*3/uL (ref 3.8–10.6)

## 2016-02-01 LAB — BASIC METABOLIC PANEL
Anion gap: 7 (ref 5–15)
BUN: 20 mg/dL (ref 6–20)
CALCIUM: 8.6 mg/dL — AB (ref 8.9–10.3)
CO2: 25 mmol/L (ref 22–32)
CREATININE: 1.13 mg/dL (ref 0.61–1.24)
Chloride: 108 mmol/L (ref 101–111)
GFR calc Af Amer: >60 mL/min (ref >60)
GLUCOSE: 123 mg/dL — AB (ref 65–99)
Potassium: 4.1 mmol/L (ref 3.5–5.1)
Sodium: 140 mmol/L (ref 135–145)

## 2016-02-01 MED ORDER — FUROSEMIDE 8 MG/ML PO SOLN: 40.0000 mg | Freq: Once | ORAL | Status: DC

## 2016-02-01 MED ORDER — FUROSEMIDE 40 MG PO TABS
40.0000 mg | ORAL_TABLET | Freq: Once | ORAL | Status: AC
  Administered 2016-02-01: 40 mg via ORAL
  Filled 2016-02-01: qty 1

## 2016-02-01 NOTE — ED Provider Notes (Signed)
[facility] Emergency Department Provider Note   ____________________________________________  Time seen:  I have reviewed the triage vital signs and the triage nursing note.  HISTORY  Chief Complaint Cough and Shortness of Breath   Historian Patient and spouse  HPI Matthew Howe is a 70 y.o. male who is here for evaluation of shortness of breath which she describes somewhat as wheezing. He states it's worse when he lies flat. He's also had increased lower extremity swelling. He has a history of CHF. He has a history of a pacemaker. He currently takes Lasix 20 mg once or twice daily, with the past week at least taking twice daily.  He does not have a history of lung disease, asthma, or COPD and does not take albuterol for any reason.  He reports that a week or so ago he had an upper respiratory illness consisting of nasal congestion, and was treated with an antibiotic which he completed, but he got a little bit better from that, and then started gaining fluid and feeling more short of breath.  He denies new fevers. He denies any productive sputum. He denies any focal one-sided leg swelling or pain or redness, and he denies pleuritic chest pain.    Past Medical History  Diagnosis Date  . Hyperlipidemia   . Hypertension   . GERD (gastroesophageal reflux disease)   . Coronary artery disease   . Parkinson's disease ([place])   . Complete heart block ([place])     a. s/p St. Jude PPM 11/2014.  [name] AICD (automatic cardioverter/defibrillator) present   . Presence of permanent cardiac pacemaker   . Heart murmur   . CHF (congestive heart failure) ([place])   . Shortness of breath dyspnea     Patient Active Problem List   Diagnosis Date Noted  . Bradycardia 09/17/2015  . Chronic diastolic heart failure ([place]) 08/11/2015  . Paroxysmal atrial fibrillation ([facility])   . Mitral valve regurgitation 06/01/2015  . Fatigue 06/01/2015  . Complete heart block ([place]) 11/21/2014  . CAD  (coronary artery disease) 04/18/2013  . Murmur 03/15/2013  . Shortness of breath 03/15/2013  . Abdominal discomfort 03/15/2013  . Parkinson's disease ([place]) 03/15/2013  . Chest pain 03/15/2013  . Essential hypertension 03/15/2013  . Hyperlipidemia 03/15/2013    Past Surgical History  Procedure Laterality Date  . Colonoscopy    . Upper gi endoscopy    . Cardiac catheterization  11/20/2012    Known CAD with one vessel coronary disease of left descending artery by cardiac cath.   . Appendectomy  12/2010  . Pacemaker insertion  11-21-14    STJ Assurity dual chamber pacemaker implanted by Dr Rayann Heman for CHB  . Permanent pacemaker insertion N/A 11/21/2014    Procedure: PERMANENT PACEMAKER INSERTION;  Surgeon: Thompson Grayer, MD;  Location: [facility] CATH LAB;  Service: Cardiovascular;  Laterality: N/A;  . Insert / replace / remove pacemaker      Current Outpatient Rx  Name  Route  Sig  Dispense  Refill  . amoxicillin-clavulanate (AUGMENTIN) 875-125 MG tablet   Oral   Take 1 tablet by mouth 2 (two) times daily.   20 tablet   0   . apixaban (ELIQUIS) 5 MG TABS tablet   Oral   Take 1 tablet (5 mg total) by mouth 2 (two) times daily.   60 tablet   6   . carvedilol (COREG) 6.25 MG tablet   Oral   Take 1 tablet (6.25 mg total) by mouth 2 (two)  times daily with a meal. Take 1 tablet in the morning and 2 tablets each evening   90 tablet   5   . desloratadine (CLARINEX) 5 MG tablet   Oral   Take 5 mg by mouth daily.         [name] dicyclomine (BENTYL) 20 MG tablet      20 mg 4 (four) times daily.          . fluticasone (FLONASE) 50 MCG/ACT nasal spray   Nasal   Place 2 sprays into the nose daily.         . furosemide (LASIX) 20 MG tablet   Oral   Take 1 tablet (20 mg total) by mouth 2 (two) times daily as needed. Patient taking differently: Take 20 mg by mouth 3 (three) times daily as needed.    60 tablet   6   . lansoprazole (PREVACID) 15 MG capsule   Oral   Take 15 mg by mouth  as needed.         [name] Linaclotide (LINZESS) 145 MCG CAPS capsule   Oral   Take 1 capsule (145 mcg total) by mouth daily.   30 capsule   6   . lisinopril (PRINIVIL,ZESTRIL) 40 MG tablet   Oral   Take 1 tablet (40 mg total) by mouth daily.   30 tablet   6   . magnesium hydroxide (MILK OF MAGNESIA) 400 MG/5ML suspension   Oral   Take 30 mLs by mouth daily as needed for mild constipation.         . NORVASC 5 MG tablet   Oral   Take 1 tablet (5 mg total) by mouth daily.   30 tablet   5     Dispense as written.   . polyethylene glycol (MIRALAX / GLYCOLAX) packet   Oral   Take 17 g by mouth daily as needed.         . potassium chloride SA (K-DUR,KLOR-CON) 20 MEQ tablet   Oral   Take 20 mEq by mouth 2 (two) times daily as needed. Reported on 12/24/2015         . pramipexole (MIRAPEX) 0.75 MG tablet   Oral   Take 0.75 mg by mouth daily.          . Probiotic Product (ALIGN) 4 MG CAPS   Oral   Take 1 capsule by mouth daily.         . selegiline (ELDEPRYL) 5 MG tablet   Oral   Take 5 mg by mouth 2 (two) times daily with a meal.          . simethicone (MYLICON) 80 MG chewable tablet      Takes 3 tablets by mouth daily.         . simvastatin (ZOCOR) 10 MG tablet   Oral   Take 1 tablet (10 mg total) by mouth daily.   30 tablet   6     Allergies Codeine; Norco; Omeprazole; and Requip   Family History  Problem Relation Age of Onset  . Heart disease Mother   . Heart disease Father   . Colon cancer Father   . Stroke Sister     Social History Social History  Substance Use Topics  . Smoking status: Never Smoker   . Smokeless tobacco: Never Used  . Alcohol Use: No    Review of Systems  Constitutional: Negative for fever. Eyes: Negative for visual changes. ENT: Negative for sore throat. Cardiovascular:  Negative for chest pain. Respiratory: Positive for shortness of breath. Gastrointestinal: Negative for abdominal pain, vomiting and  diarrhea. Genitourinary: Negative for dysuria. Musculoskeletal: Negative for back pain. Skin: Negative for rash. Neurological: Negative for headache. 10 point Review of Systems otherwise negative ____________________________________________   PHYSICAL EXAM:  VITAL SIGNS: ED Triage Vitals  Enc Vitals Group     BP 02/01/16 1351 158/94 mmHg     Pulse Rate 02/01/16 1351 92     Resp 02/01/16 1351 18     Temp 02/01/16 1351 97.7 F (36.5 C)     Temp Source 02/01/16 1351 Oral     SpO2 02/01/16 1351 96 %     Weight 02/01/16 1351 234 lb (106.142 kg)     Height 02/01/16 1351 6\' 1"  (1.854 m)     Head Cir --      Peak Flow --      Pain Score 02/01/16 1352 0     Pain Loc --      Pain Edu? --      Excl. in [place]? --      Constitutional: Alert and oriented. Well appearing and in no distress. HEENT   Head: Normocephalic and atraumatic.      Eyes: Conjunctivae are normal. PERRL. Normal extraocular movements.      Ears:         Nose: No congestion/rhinnorhea.   Mouth/Throat: Mucous membranes are moist.   Neck: No stridor. Cardiovascular/Chest: Normal rate, regular rhythm.  No murmurs, rubs, or gallops. Respiratory: Normal respiratory effort without tachypnea nor retractions. Mild decreased breath sounds especially at the bases without rhonchi or wheezing or rales appreciable. He did have one small wheeze left anterior lung field, but did not continue. Gastrointestinal: Soft. No distention, no guarding, no rebound. Nontender.   Genitourinary/rectal:Deferred Musculoskeletal: Nontender with normal range of motion in all extremities. No joint effusions.  No lower extremity tenderness.  4+ lower extremity pitting edema bilateral lower extremities. Neurologic:  Normal speech and language. No gross or focal neurologic deficits are appreciated. Skin:  Skin is warm, dry and intact. No rash noted. Psychiatric: Mood and affect are normal. Speech and behavior are normal. Patient exhibits  appropriate insight and judgment.  ____________________________________________   EKG I, Lisa Roca, MD, the attending physician have personally viewed and interpreted all ECGs.  91 bpm. Ventricularly paced rhythm ____________________________________________  LABS (pertinent positives/negatives)  Basic metabolic panel without significant abnormalities White blood count 9.1, hemoglobin 14.1 and platelet count 213 Troponin 0.04 Repeat troponin 0.04  ____________________________________________  RADIOLOGY All Xrays were viewed by me. Imaging interpreted by Radiologist.  Chest two-view:  IMPRESSION: Cardiomegaly and chronic vascular congestion. No overt pulmonary edema or acute findings. __________________________________________  PROCEDURES  Procedure(s) performed: None  Critical Care performed: None  ____________________________________________   ED COURSE / ASSESSMENT AND PLAN  Pertinent labs & imaging results that were available during my care of the patient were reviewed by me and considered in my medical decision making (see chart for details).   Patient is here with complaint of shortness of breath which is somewhat worse with lying recumbent, and with some exertion. He recently got over a URI, was concerned it may be troponin and pneumonia. He is not febrile, does not have an elevated white blood cell count, and does not have an infiltrate on chest x-ray. He is also not hypoxic. His O2 sat is around 94-96% on room air.  Clinically symptoms seemed consistent with CHF exacerbation, both on history and physical exam. I  discussed this with the patient, and this makes a lot of sense to him as well. I am recommending increasing from 20 mg twice daily of Lasix to 40 g twice daily for about 3 days.  There's been no real chest discomfort, though not highly suspicious of ACS. However he does have a pacer, making his EKG unreliable for evaluating for any new changes there.  His initial troponin was 0.04, minimally elevated. I discussed with the family obtaining a repeat level prior to discharge.  His symptoms do not sound consistent with PE, not suspicious of this at this point in time.  Repeat troponin unchanged. I will go ahead and discharge home.    CONSULTATIONS:   None   Patient / Family / Caregiver informed of clinical course, medical decision-making process, and agree with plan.   I discussed return precautions, follow-up instructions, and discharged instructions with patient and/or family.   ___________________________________________   FINAL CLINICAL IMPRESSION(S) / ED DIAGNOSES   Final diagnoses:  Acute on chronic congestive heart failure, unspecified congestive heart failure type [facility])              Note: This dictation was prepared with Dragon dictation. Any transcriptional errors that result from this process are unintentional   Lisa Roca, MD 02/01/16 1935

## 2016-02-01 NOTE — Discharge Instructions (Signed)
You were evaluated for shortness of breath, and I suspect the source is congestive heart failure, fluid buildup. We discussed, increase your dose of Lasix from 20 mg twice daily to 40 mg twice daily for 3 days, and then discussed with your primary care physician about which does to continue, the higher dose, or go back to the lower dose.  Return to the emergency department for any worsening condition including fever, trouble breathing, shortness of breath, or chest pain.   Heart Failure Heart failure means your heart has trouble pumping blood. This makes it hard for your body to work well. Heart failure is usually a long-term (chronic) condition. You must take good care of yourself and follow your doctor's treatment plan. HOME CARE  Take your heart medicine as told by your doctor.  Do not stop taking medicine unless your doctor tells you to.  Do not skip any dose of medicine.  Refill your medicines before they run out.  Take other medicines only as told by your doctor or pharmacist.  Stay active if told by your doctor. The elderly and people with severe heart failure should talk with a doctor about physical activity.  Eat heart-healthy foods. Choose foods that are without trans fat and are low in saturated fat, cholesterol, and salt (sodium). This includes fresh or frozen fruits and vegetables, fish, lean meats, fat-free or low-fat dairy foods, whole grains, and high-fiber foods. Lentils and dried peas and beans (legumes) are also good choices.  Limit salt if told by your doctor.  Cook in a healthy way. Roast, grill, broil, bake, poach, steam, or stir-fry foods.  Limit fluids as told by your doctor.  Weigh yourself every morning. Do this after you pee (urinate) and before you eat breakfast. Write down your weight to give to your doctor.  Take your blood pressure and write it down if your doctor tells you to.  Ask your doctor how to check your pulse. Check your pulse as told.  Lose  weight if told by your doctor.  Stop smoking or chewing tobacco. Do not use gum or patches that help you quit without your doctor's approval.  Schedule and go to doctor visits as told.  Nonpregnant women should have no more than 1 drink a day. Men should have no more than 2 drinks a day. Talk to your doctor about drinking alcohol.  Stop illegal drug use.  Stay current with shots (immunizations).  Manage your health conditions as told by your doctor.  Learn to manage your stress.  Rest when you are tired.  If it is really hot outside:  Avoid intense activities.  Use air conditioning or fans, or get in a cooler place.  Avoid caffeine and alcohol.  Wear loose-fitting, lightweight, and light-colored clothing.  If it is really cold outside:  Avoid intense activities.  Layer your clothing.  Wear mittens or gloves, a hat, and a scarf when going outside.  Avoid alcohol.  Learn about heart failure and get support as needed.  Get help to maintain or improve your quality of life and your ability to care for yourself as needed. GET HELP IF:   You gain weight quickly.  You are more short of breath than usual.  You cannot do your normal activities.  You tire easily.  You cough more than normal, especially with activity.  You have any or more puffiness (swelling) in areas such as your hands, feet, ankles, or belly (abdomen).  You cannot sleep because it is hard  to breathe.  You feel like your heart is beating fast (palpitations).  You get dizzy or light-headed when you stand up. GET HELP RIGHT AWAY IF:   You have trouble breathing.  There is a change in mental status, such as becoming less alert or not being able to focus.  You have chest pain or discomfort.  You faint. MAKE SURE YOU:   Understand these instructions.  Will watch your condition.  Will get help right away if you are not doing well or get worse.   This information is not intended to replace  advice given to you by your health care provider. Make sure you discuss any questions you have with your health care provider.   Document Released: 07/05/2008 Document Revised: 10/17/2014 Document Reviewed: 11/12/2012 Elsevier Interactive Patient Education [employer].

## 2016-02-01 NOTE — ED Notes (Signed)
Pt c/o cough with congestion for the past month, states in the past week he has had increased SOB for the past 3 days, states he was not able to walk to his car without stopping to catch his breath..c/o pain in chest with movement and breathing.[name]

## 2016-02-01 NOTE — Telephone Encounter (Signed)
Patient's wife called stating that patient has had a terrible cold recently with lots of coughing. He's now having shortness of breath along with chest pain. Advised patient's wife to call his cardiologist to see if they can see him today and, if not, that he should go to the ER to be evaluated. Wife was appreciative and says that she will make the call to his cardiologist.

## 2016-02-01 NOTE — ED Notes (Signed)

## 2016-02-01 NOTE — Telephone Encounter (Signed)
Spoke w/ pt's wife.  She reports that pt's chest pain is worsening. Advised her that I agree w/ Otila Kluver Hackney's recommendation to proceed to ED.  She states that she will take him there now.

## 2016-02-01 NOTE — Telephone Encounter (Signed)
Pt wife calling stating pt is having some LIGHT chest pain, real bad cough  Called Heart Failure clinic was advise to call us Pt c/o of Chest Pain: STAT if CP now or developed within 24 hours  1. Are you having CP right now? Yes   2. Are you experiencing any other symptoms (ex. SOB, nausea, vomiting, sweating)? Not sure she is not with him  3. How long have you been experiencing CP? 3 day   4. Is your CP continuous or coming and going? Coming and going  5. Have you taken Nitroglycerin? Does not have any.  ?

## 2016-02-15 ENCOUNTER — Other Ambulatory Visit: Payer: Self-pay

## 2016-02-15 MED ORDER — SIMVASTATIN 10 MG PO TABS: 10.0000 mg | ORAL_TABLET | Freq: Every day | ORAL | Status: DC

## 2016-03-01 ENCOUNTER — Telehealth: Payer: Self-pay | Admitting: Cardiology

## 2016-03-01 ENCOUNTER — Ambulatory Visit (INDEPENDENT_AMBULATORY_CARE_PROVIDER_SITE_OTHER): Payer: Medicare Other | Admitting: *Deleted

## 2016-03-01 DIAGNOSIS — I442 Atrioventricular block, complete: Secondary | ICD-10-CM | POA: Diagnosis not present

## 2016-03-01 NOTE — Telephone Encounter (Signed)
LMOVM reminding pt to send remote transmission.   

## 2016-03-02 ENCOUNTER — Telehealth: Payer: Self-pay | Admitting: Cardiovascular Disease

## 2016-03-02 NOTE — Telephone Encounter (Signed)
Pt c/o medication issue:  1. Name of Medication:  AM  Dicyclomine Selegiline Carvedilol (1) Eliquis Lunch  Dicyclomine Selegiline PM Dicyclomine Liness  Lisinopril Simvastatin Pramipexole  Carvedilol (2) Eliquis  40 mg fluid pill and Potassium (as needed)    2. How are you currently taking this medication (dosage and times per day)?   3. Are you having a reaction (difficulty breathing--STAT)? no  4. What is your medication issue? Something in that list above that is not letting patient sleep. He just feels bad all the time.  Please advise.

## 2016-03-02 NOTE — Progress Notes (Signed)
Carelink Summary Report / Loop Recorder 

## 2016-03-02 NOTE — Telephone Encounter (Signed)
Spoke w/ pt's wife.  She reports that pt is having trouble sleeping, sick on his stomach every night x several weeks.  Pt had pacer check and everything was fine.  She believes his meds are causing these sx. Reviewed cardiac meds w/ her and advised that none of these will keep him up at night. Advised her to contact pt's PCP regarding non-cardiac meds and see if pt needs to be evaluated. She is agreeable and will call back if we can be of further assistance.

## 2016-03-08 ENCOUNTER — Telehealth: Payer: Self-pay | Admitting: Cardiovascular Disease

## 2016-03-08 ENCOUNTER — Other Ambulatory Visit: Payer: Self-pay | Admitting: Cardiovascular Disease

## 2016-03-08 NOTE — Telephone Encounter (Signed)
Pt wife is calling states wrong dose for Lasix was called to his pharmacy. States he needs 40 mg. Please call.

## 2016-03-08 NOTE — Telephone Encounter (Signed)
Spoke w/ pt's wife.   She reports that hospitalist advised pt to take lasix 40 mg prn TID and this is "keeping him balanced". Advised her that pt's chart indicates lasix 20 mg prn TID, but she states that the ED MD advised them in the room and that the written chart is incorrect.  She states that pt drinks a lot of water during the day, as he works outside.  He eats canned fruit, peanut butter crackers & chips throughout the day, but she insists that they are all low sodium. Attempted to discuss his diet and fluid intake further, but she does not want to discuss. Advised her that I will make Dr. Rockey Situ aware of her med concerns and call her back w/ his recommendation regarding lasix dosage.  She is appreciative and will continue pt on 40 mg dosing until she hears back from our office.

## 2016-03-09 NOTE — Telephone Encounter (Signed)
Spoke w/ pt's wife.  Advised her that Dr. Rockey Situ recommends an ov, as pt has not been seen since December. Advised her that lasix 40 mg TID is a lot of diuretic and not for long term use.  Pt is sched to see Dr. Rockey Situ tomorrow @ 3:40 to discuss.

## 2016-03-09 NOTE — Telephone Encounter (Signed)
Pt wife calling stating they are having issues with the medication change  We asked patient to take Lasix 20 mg 3 times a day  She states patient did that but this morning his feet are really swollen and he is having SOB  She is very upset and states she understand he can't be on the 40 mg 3 times a day for it would effect his kidneys but states in the past it worked really well She said we needed to call her back as soon as we can she is not wanting to spend another 15 hours at ED  Please advise.

## 2016-03-10 ENCOUNTER — Ambulatory Visit (INDEPENDENT_AMBULATORY_CARE_PROVIDER_SITE_OTHER): Payer: Medicare Other | Admitting: Cardiovascular Disease

## 2016-03-10 ENCOUNTER — Encounter: Payer: Self-pay | Admitting: Cardiovascular Disease

## 2016-03-10 VITALS — BP 156/90 | HR 67 | Ht 73.0 in | Wt 238.1 lb

## 2016-03-10 DIAGNOSIS — G20 Parkinson's disease: Secondary | ICD-10-CM

## 2016-03-10 DIAGNOSIS — I5033 Acute on chronic diastolic (congestive) heart failure: Secondary | ICD-10-CM | POA: Diagnosis not present

## 2016-03-10 DIAGNOSIS — I10 Essential (primary) hypertension: Secondary | ICD-10-CM

## 2016-03-10 DIAGNOSIS — I442 Atrioventricular block, complete: Secondary | ICD-10-CM | POA: Diagnosis not present

## 2016-03-10 DIAGNOSIS — R0602 Shortness of breath: Secondary | ICD-10-CM | POA: Diagnosis not present

## 2016-03-10 DIAGNOSIS — I480 Paroxysmal atrial fibrillation: Secondary | ICD-10-CM | POA: Diagnosis not present

## 2016-03-10 DIAGNOSIS — R109 Unspecified abdominal pain: Secondary | ICD-10-CM

## 2016-03-10 DIAGNOSIS — E785 Hyperlipidemia, unspecified: Secondary | ICD-10-CM

## 2016-03-10 MED ORDER — POTASSIUM CHLORIDE CRYS ER 10 MEQ PO TBCR: 20.0000 meq | EXTENDED_RELEASE_TABLET | Freq: Two times a day (BID) | ORAL | Status: DC | PRN

## 2016-03-10 MED ORDER — TORSEMIDE 20 MG PO TABS: 40.0000 mg | ORAL_TABLET | Freq: Two times a day (BID) | ORAL | Status: DC | PRN

## 2016-03-10 NOTE — Patient Instructions (Signed)
Please stop the lasix/furosemide Start Torsemide one pill twice a day with potassium twice a day (10 meq pill of potassium)  Weight daily After one week, if no change in weight and shortness of breath, Call the office  We would probably increase up to torsemide 2 in the AM and one in the PM   Please call us if you have new issues that need to be addressed before your next appt.  Your physician wants you to follow-up in: June 14

## 2016-03-10 NOTE — Progress Notes (Signed)
Patient ID: TYRUS SINGLETON, male   DOB: 23-Jun-1946, 70 y.o.   MRN: [id number] Cardiology Office Note  Date:  03/10/2016   ID:  Matthew Howe, Matthew Howe 1945/12/06, MRN [id number]  PCP:  Leonel Ramsay, MD   Chief Complaint  Patient presents with  . OTHER    F/U Patient C/O SOB. Medications verbally reviewed with patient.     HPI:  Matthew Howe is a very pleasant 70 year old gentleman with diagnosis of Parkinson's who currently works delivering car parts, history of hypertension, cardiac catheterization showing mild CAD after stress test at outpatient facility showed lateral wall hypokinesis on stress echo, hyperlipidemia, s/p pacemaker placement. Found to have atrial fibrillation 11/22/2014 on pacemaker download, started on anticoagulation  prior cardiac catheterization February 2014 with nonobstructive disease Previous hospitalization for paroxysmal atrial fibrillation, acute on chronic diastolic CHF He presents today for weight gain, shortness of breath, leg edema  Wife is concerned about his recent condition, she has been giving him Lasix 20 mg 3 times a day, recent evaluation in the emergency room for shortness of breath, leg swelling At that time was instructed to take Lasix 40 mg 3 times a day for 3 days then back down to 20 minute grams 3 times a day Unclear if there was improvement in his shortness of breath  She reports that he has been unable to sleep in the bed secondary to shortness breath, significant shortness of breath on exertion. Abdomen has been tight Recently stopped Linzess, which she was taking for chronic constipation Breathing much worse at nighttime, especially when supine, sleeps better sitting up.  Weight has increased from 224 pounds now 238 pounds. Wife reports he is not eating very much given his constipation issues Does not drink much during the daytime, denies having excessive sodium.  EKG on today's visit shows AV paced rhythm, 69 bpm  Other past medical  history  presented to the hospital 07/28/2015 with abdominal bloating, shortness of breath Was treated with diureticswith improvement of his symptoms Encouraged to take Lasix daily  leads a busy lifestyle, continues to work most days of the week. Fatigue, tremor Followed by neurology, Dr. Melrose Nakayama for Parkinson's Previous issues with constipation  He presented to [facility] with bradycardia February 2016. This seemed to improve without intervention and he is asymptomatic. He had recurrent symptoms and we referred him to [facility] for further evaluation. He was seen in the emergency room and found to be in heart block, pacemaker placed urgently   Previous Stress echo leading to cardiac catheterization at [facility].  Stress echo was a limited study, ejection fraction estimated at 45% with EF of 50% at peak stress.  Cardiac catheterization 11/20/2012 with 25% proximal LAD disease, followed by 40% lesion. The cardiac catheterization note makes mention of atrial flutter though arrhythmia is not mentioned in any of the clinic notes.  Previous Echocardiogram shows ejection fraction 50-55%, mild to moderate aortic valve insufficiency, otherwise normal study.  Abdominal CT 12/22/2010 showed acute appendicitis, colonic diverticulosis    PMH:   has a past medical history of Hyperlipidemia; Hypertension; GERD (gastroesophageal reflux disease); Coronary artery disease; Parkinson's disease ([place]); Complete heart block ([place]); AICD (automatic cardioverter/defibrillator) present; Presence of permanent cardiac pacemaker; Heart murmur; CHF (congestive heart failure) ([place]); and Shortness of breath dyspnea.  PSH:    Past Surgical History  Procedure Laterality Date  . Colonoscopy    . Upper gi endoscopy    . Cardiac catheterization  11/20/2012    Known CAD with one vessel  coronary disease of left descending artery by cardiac cath.   . Appendectomy  12/2010  . Pacemaker insertion  11-21-14    STJ Assurity dual  chamber pacemaker implanted by Dr Rayann Heman for CHB  . Permanent pacemaker insertion N/A 11/21/2014    Procedure: PERMANENT PACEMAKER INSERTION;  Surgeon: Thompson Grayer, MD;  Location: [facility] CATH LAB;  Service: Cardiovascular;  Laterality: N/A;  . Insert / replace / remove pacemaker      Current Outpatient Prescriptions  Medication Sig Dispense Refill  . apixaban (ELIQUIS) 5 MG TABS tablet Take 1 tablet (5 mg total) by mouth 2 (two) times daily. 60 tablet 6  . carvedilol (COREG) 6.25 MG tablet Take 1 tablet (6.25 mg total) by mouth 2 (two) times daily with a meal. Take 1 tablet in the morning and 2 tablets each evening 90 tablet 5  . desloratadine (CLARINEX) 5 MG tablet Take 5 mg by mouth daily.    [name] dicyclomine (BENTYL) 20 MG tablet 20 mg 4 (four) times daily.     . fluticasone (FLONASE) 50 MCG/ACT nasal spray Place 2 sprays into the nose daily.    . furosemide (LASIX) 20 MG tablet TAKE 1 TABLET (20 MG TOTAL) BY MOUTH 2 (TWO) TIMES DAILY AS NEEDED. 60 tablet 6  . lansoprazole (PREVACID) 15 MG capsule Take 15 mg by mouth as needed.    . Linaclotide (LINZESS) 145 MCG CAPS capsule Take 1 capsule (145 mcg total) by mouth daily. 30 capsule 6  . lisinopril (PRINIVIL,ZESTRIL) 40 MG tablet Take 1 tablet (40 mg total) by mouth daily. 30 tablet 6  . magnesium hydroxide (MILK OF MAGNESIA) 400 MG/5ML suspension Take 30 mLs by mouth daily as needed for mild constipation.    . NORVASC 5 MG tablet Take 1 tablet (5 mg total) by mouth daily. 30 tablet 5  . polyethylene glycol (MIRALAX / GLYCOLAX) packet Take 17 g by mouth daily as needed.    . potassium chloride SA (K-DUR,KLOR-CON) 10 MEQ tablet Take 2 tablets (20 mEq total) by mouth 2 (two) times daily as needed. Reported on 12/24/2015 120 tablet 6  . pramipexole (MIRAPEX) 0.75 MG tablet Take 0.75 mg by mouth daily.     . selegiline (ELDEPRYL) 5 MG tablet Take 5 mg by mouth 2 (two) times daily with a meal.     . simethicone (MYLICON) 80 MG chewable tablet Takes 3  tablets by mouth daily.    . simvastatin (ZOCOR) 10 MG tablet Take 1 tablet (10 mg total) by mouth daily. 30 tablet 6  . amoxicillin-clavulanate (AUGMENTIN) 875-125 MG tablet Take 1 tablet by mouth 2 (two) times daily. (Patient not taking: Reported on 03/10/2016) 20 tablet 0  . Probiotic Product (ALIGN) 4 MG CAPS Take 1 capsule by mouth daily. Reported on 03/10/2016    . torsemide (DEMADEX) 20 MG tablet Take 2 tablets (40 mg total) by mouth 2 (two) times daily as needed. 120 tablet 6   No current facility-administered medications for this visit.     Allergies:   Codeine; Norco; Omeprazole; and Requip    Social History:  The patient  reports that he has never smoked. He has never used smokeless tobacco. He reports that he does not drink alcohol or use illicit drugs.   Family History:   family history includes Colon cancer in his father; Heart disease in his father and mother; Stroke in his sister.    Review of Systems: Review of Systems  Constitutional: Negative.  Weight gain  Respiratory: Positive for shortness of breath.   Cardiovascular: Positive for leg swelling.  Gastrointestinal: Negative.   Musculoskeletal: Negative.   Neurological: Negative.   Psychiatric/Behavioral: Negative.   All other systems reviewed and are negative.    PHYSICAL EXAM: VS:  BP 156/90 mmHg  Pulse 67  Ht 6\' 1"  (1.854 m)  Wt 238 lb 1.9 oz (108.011 kg)  BMI 31.42 kg/m2 , BMI Body mass index is 31.42 kg/(m^2). GEN: Well nourished, well developed, in no acute distress HEENT: normal Neck: Unable to assess JVD, carotid bruits, or masses Cardiac: RRR; no murmurs, rubs, or gallops, trace edema above the sock line Respiratory:  clear to auscultation bilaterally, normal work of breathing GI: soft, nontender, nondistended, + BS MS: no deformity or atrophy Skin: warm and dry, no rash Neuro:  Strength and sensation are intact Psych: euthymic mood, full affect    Recent Labs: 07/29/2015: B Natriuretic  Peptide 844.0* 02/01/2016: BUN 20; Creatinine, Ser 1.13; Hemoglobin 14.1; Platelets 213; Potassium 4.1; Sodium 140    Lipid Panel Lab Results  Component Value Date   CHOL 179 07/29/2015   HDL 34* 07/29/2015   LDLCALC 123* 07/29/2015   TRIG 111 07/29/2015      Wt Readings from Last 3 Encounters:  03/10/16 238 lb 1.9 oz (108.011 kg)  02/01/16 234 lb (106.142 kg)  12/24/15 237 lb (107.502 kg)       ASSESSMENT AND PLAN:  Paroxysmal atrial fibrillation ([place]) - Plan: EKG 12-Lead We did call [place], obtain the results of his pacemaker download This shows mode switches concerning for rare episodes of atrial tachycardia, 12 episodes noted, longest was 24 seconds. No ventricular episodes. No documentation of high atrial fibrillation burden  SOB (shortness of breath) -  Given his weight gain, abdominal bloating, shortness of breath, leg edema, most likely has acute on chronic diastolic CHF. Plan as below  Acute on chronic diastolic CHF (congestive heart failure) ([place]) Long discussion concerning various treatment options He denies excessive salt or fluid intake Echocardiogram several months ago showing preserved ejection fraction Recommended he stop the Lasix, start torsemide 20 mrem twice a day with potassium 10 mEq twice a day. If no improvement in his symptoms and weight over the course of the next week, we may need to slowly increase torsemide dosing up to 40 mg twice a day.  Previous office weight was 224 pounds, he is up 14 pounds  Complete heart block ([facility]) Paced rhythm. Unclear if this ventricular pacing is contributing to his diastolic CHF We'll discuss with Dr. Caryl Comes  Essential hypertension Blood pressure mildly elevated on today's visit, should improve with aggressive diuresis  Hyperlipidemia Cholesterol 180s on low-dose statin No changes at this time  Abdominal discomfort Abdominal bloating, discomfort possibly secondary to fluid retention Unable to exclude  underlying constipation Was previously on linzess at he has held this recently for abdominal bloating  Parkinson's disease ([place]) Managed by neurology, reports symptoms are stable   Disposition:   F/U  2 weeks    Total encounter time more than 25 minutes  Greater than 50% was spent in counseling and coordination of care with the patient    Orders Placed This Encounter  Procedures  . EKG 12-Lead    Signed, Esmond Plants, M.D., Ph.D. 03/10/2016  [place], [place]

## 2016-03-14 ENCOUNTER — Other Ambulatory Visit: Payer: Self-pay | Admitting: Cardiovascular Disease

## 2016-03-17 ENCOUNTER — Telehealth: Payer: Self-pay | Admitting: Cardiovascular Disease

## 2016-03-17 NOTE — Telephone Encounter (Signed)
Patient wife says he has improved since med change at last OV.  Dr. Rockey Situ increase lasix and patient has been taking 2-3 times daily and sob has improved with weight loss of 7 lbs .  Patient just wanted [place] to have an update.

## 2016-03-17 NOTE — Telephone Encounter (Signed)
Forward to MD to make aware of improvement.

## 2016-03-20 NOTE — Telephone Encounter (Signed)
He is on torsemide, Lasix was held and we can take this off his med list. Would suggest he come in sometime for a BMP to check kidney function and potassium level Glad he is feeling better

## 2016-03-20 NOTE — Telephone Encounter (Signed)
Also, need specifics,  What is his weight at home currently and prior to starting torsemide

## 2016-03-21 ENCOUNTER — Other Ambulatory Visit: Payer: Self-pay

## 2016-03-21 DIAGNOSIS — I5032 Chronic diastolic (congestive) heart failure: Secondary | ICD-10-CM

## 2016-03-21 LAB — CUP PACEART REMOTE DEVICE CHECK
Battery Remaining Longevity: 125 mo
Battery Remaining Percentage: 95.5 %
Battery Voltage: 2.99 V
Brady Statistic RA Percent Paced: 27 %
Implantable Lead Implant Date: 20160212
Implantable Lead Location: 753859
Implantable Lead Model: 1948
Lead Channel Impedance Value: 450 Ohm
Lead Channel Pacing Threshold Amplitude: 0.625 V
Lead Channel Pacing Threshold Pulse Width: 0.4 ms
Lead Channel Sensing Intrinsic Amplitude: 2.8 mV
Lead Channel Setting Sensing Sensitivity: 4 mV
MDC IDC LEAD IMPLANT DT: 20160212
MDC IDC LEAD LOCATION: 753860
MDC IDC MSMT LEADCHNL RV IMPEDANCE VALUE: 580 Ohm
MDC IDC MSMT LEADCHNL RV PACING THRESHOLD AMPLITUDE: 1.25 V
MDC IDC MSMT LEADCHNL RV PACING THRESHOLD PULSEWIDTH: 0.4 ms
MDC IDC MSMT LEADCHNL RV SENSING INTR AMPL: 12 mV
MDC IDC SESS DTM: 20170523224357
MDC IDC SET LEADCHNL RA PACING AMPLITUDE: 1.625
MDC IDC SET LEADCHNL RV PACING AMPLITUDE: 1.5 V
MDC IDC SET LEADCHNL RV PACING PULSEWIDTH: 0.4 ms
MDC IDC STAT BRADY AP VP PERCENT: 27 %
MDC IDC STAT BRADY AP VS PERCENT: 1 %
MDC IDC STAT BRADY AS VP PERCENT: 72 %
MDC IDC STAT BRADY AS VS PERCENT: 1 %
MDC IDC STAT BRADY RV PERCENT PACED: 99 %
Pulse Gen Serial Number: 3049931

## 2016-03-21 NOTE — Telephone Encounter (Signed)
Forward to Dr. Gollan to make aware 

## 2016-03-21 NOTE — Telephone Encounter (Signed)
S/w pt who reports improvement in sx.  He d/c'd lasix and started taking torsemide after 6/1 OV. Confirms he is taking potassium supplement as well. States he weighs himself daily and his weight has dropped from 242 lbs to 235 lbs. He understands labs will be drawn at 6/14 OV. Lab order placed.  Pt agreeable w/plan w/no further questions at this time.

## 2016-03-23 ENCOUNTER — Encounter: Payer: Self-pay | Admitting: Cardiovascular Disease

## 2016-03-23 ENCOUNTER — Encounter: Payer: Self-pay | Admitting: Family

## 2016-03-23 ENCOUNTER — Ambulatory Visit: Payer: Medicare Other | Attending: Family | Admitting: Family

## 2016-03-23 ENCOUNTER — Ambulatory Visit (INDEPENDENT_AMBULATORY_CARE_PROVIDER_SITE_OTHER): Payer: Medicare Other | Admitting: Cardiovascular Disease

## 2016-03-23 VITALS — BP 150/75 | HR 60 | Resp 18 | Ht 73.0 in | Wt 239.0 lb

## 2016-03-23 VITALS — BP 140/86 | HR 60 | Ht 73.0 in | Wt 238.8 lb

## 2016-03-23 DIAGNOSIS — I5032 Chronic diastolic (congestive) heart failure: Secondary | ICD-10-CM | POA: Insufficient documentation

## 2016-03-23 DIAGNOSIS — M7989 Other specified soft tissue disorders: Secondary | ICD-10-CM | POA: Insufficient documentation

## 2016-03-23 DIAGNOSIS — E669 Obesity, unspecified: Secondary | ICD-10-CM | POA: Diagnosis not present

## 2016-03-23 DIAGNOSIS — Z7901 Long term (current) use of anticoagulants: Secondary | ICD-10-CM | POA: Diagnosis not present

## 2016-03-23 DIAGNOSIS — K219 Gastro-esophageal reflux disease without esophagitis: Secondary | ICD-10-CM | POA: Diagnosis not present

## 2016-03-23 DIAGNOSIS — I252 Old myocardial infarction: Secondary | ICD-10-CM | POA: Diagnosis not present

## 2016-03-23 DIAGNOSIS — Z888 Allergy status to other drugs, medicaments and biological substances status: Secondary | ICD-10-CM | POA: Insufficient documentation

## 2016-03-23 DIAGNOSIS — Z885 Allergy status to narcotic agent status: Secondary | ICD-10-CM | POA: Diagnosis not present

## 2016-03-23 DIAGNOSIS — R0602 Shortness of breath: Secondary | ICD-10-CM | POA: Insufficient documentation

## 2016-03-23 DIAGNOSIS — Z8249 Family history of ischemic heart disease and other diseases of the circulatory system: Secondary | ICD-10-CM | POA: Insufficient documentation

## 2016-03-23 DIAGNOSIS — I2510 Atherosclerotic heart disease of native coronary artery without angina pectoris: Secondary | ICD-10-CM

## 2016-03-23 DIAGNOSIS — I480 Paroxysmal atrial fibrillation: Secondary | ICD-10-CM

## 2016-03-23 DIAGNOSIS — K5900 Constipation, unspecified: Secondary | ICD-10-CM | POA: Insufficient documentation

## 2016-03-23 DIAGNOSIS — G479 Sleep disorder, unspecified: Secondary | ICD-10-CM | POA: Diagnosis not present

## 2016-03-23 DIAGNOSIS — K5909 Other constipation: Secondary | ICD-10-CM

## 2016-03-23 DIAGNOSIS — I110 Hypertensive heart disease with heart failure: Secondary | ICD-10-CM | POA: Diagnosis not present

## 2016-03-23 DIAGNOSIS — Z823 Family history of stroke: Secondary | ICD-10-CM | POA: Insufficient documentation

## 2016-03-23 DIAGNOSIS — G20 Parkinson's disease: Secondary | ICD-10-CM | POA: Diagnosis not present

## 2016-03-23 DIAGNOSIS — I442 Atrioventricular block, complete: Secondary | ICD-10-CM | POA: Insufficient documentation

## 2016-03-23 DIAGNOSIS — R109 Unspecified abdominal pain: Secondary | ICD-10-CM | POA: Diagnosis not present

## 2016-03-23 DIAGNOSIS — I10 Essential (primary) hypertension: Secondary | ICD-10-CM

## 2016-03-23 DIAGNOSIS — I4891 Unspecified atrial fibrillation: Secondary | ICD-10-CM | POA: Insufficient documentation

## 2016-03-23 DIAGNOSIS — E785 Hyperlipidemia, unspecified: Secondary | ICD-10-CM | POA: Insufficient documentation

## 2016-03-23 DIAGNOSIS — Z950 Presence of cardiac pacemaker: Secondary | ICD-10-CM | POA: Insufficient documentation

## 2016-03-23 DIAGNOSIS — Z800 Family history of malignant neoplasm of digestive organs: Secondary | ICD-10-CM | POA: Insufficient documentation

## 2016-03-23 DIAGNOSIS — Z95810 Presence of automatic (implantable) cardiac defibrillator: Secondary | ICD-10-CM | POA: Diagnosis not present

## 2016-03-23 DIAGNOSIS — Z6831 Body mass index (BMI) 31.0-31.9, adult: Secondary | ICD-10-CM | POA: Diagnosis not present

## 2016-03-23 NOTE — Patient Instructions (Signed)
Continue weighing daily and call for an overnight weight gain of > 2 pounds or a weekly weight gain of >5 pounds. 

## 2016-03-23 NOTE — Progress Notes (Signed)
Subjective:    Patient ID: Matthew Howe, male    DOB: 1946-03-08, 70 y.o.   MRN: [id number]  Congestive Heart Failure Presents for follow-up visit. The disease course has been fluctuating. Associated symptoms include abdominal pain (at times), edema, fatigue and shortness of breath. Pertinent negatives include no chest pain, chest pressure, orthopnea or palpitations. The symptoms have been worsening. Past treatments include salt and fluid restriction, beta blockers and ACE inhibitors. The treatment provided mild relief. Compliance with prior treatments has been good. His past medical history is significant for CAD and HTN. There is no history of CVA or DM. He has multiple 1st degree relatives with heart disease.  Hypertension This is a chronic problem. The current episode started more than 1 year ago. The problem has been waxing and waning since onset. Associated symptoms include peripheral edema and shortness of breath. Pertinent negatives include no chest pain, headaches, neck pain or palpitations. There are no associated agents to hypertension. Risk factors for coronary artery disease include family history, male gender and obesity. Past treatments include beta blockers, ACE inhibitors, diuretics and lifestyle changes. The current treatment provides moderate improvement. There are no compliance problems.  Hypertensive end-organ damage includes CAD/MI and heart failure. There is no history of kidney disease.   Past Medical History  Diagnosis Date  . Hyperlipidemia   . Hypertension   . GERD (gastroesophageal reflux disease)   . Coronary artery disease   . Parkinson's disease ([place])   . Complete heart block ([place])     a. s/p St. Jude PPM 11/2014.  [name] AICD (automatic cardioverter/defibrillator) present   . Presence of permanent cardiac pacemaker   . Heart murmur   . CHF (congestive heart failure) ([place])   . Shortness of breath dyspnea     Past Surgical History  Procedure Laterality Date  .  Colonoscopy    . Upper gi endoscopy    . Cardiac catheterization  11/20/2012    Known CAD with one vessel coronary disease of left descending artery by cardiac cath.   . Appendectomy  12/2010  . Pacemaker insertion  11-21-14    STJ Assurity dual chamber pacemaker implanted by Dr Rayann Heman for CHB  . Permanent pacemaker insertion N/A 11/21/2014    Procedure: PERMANENT PACEMAKER INSERTION;  Surgeon: Thompson Grayer, MD;  Location: [facility] C[facility] LAB;  Service: Cardiovascular;  Laterality: N/A;  . Insert / replace / remove pacemaker      Family History  Problem Relation Age of Onset  . Heart disease Mother   . Heart disease Father   . Colon cancer Father   . Stroke Sister     Social History  Substance Use Topics  . Smoking status: Never Smoker   . Smokeless tobacco: Never Used  . Alcohol Use: No    Allergies  Allergen Reactions  . Codeine   . Norco [Hydrocodone-Acetaminophen] Diarrhea and Nausea And Vomiting  . Omeprazole Diarrhea and Nausea And Vomiting  . Requip  [Ropinirole] Nausea Only    Prior to Admission medications   Medication Sig Start Date End Date Taking? Authorizing Provider  apixaban (ELIQUIS) 5 MG TABS tablet Take 1 tablet (5 mg total) by mouth 2 (two) times daily. 09/30/15  Yes Minna Merritts, MD  carvedilol (COREG) 6.25 MG tablet Take 1 tablet (6.25 mg total) by mouth 2 (two) times daily with a meal. Take 1 tablet in the morning and 2 tablets each evening 12/24/15  Yes Alisa Graff, FNP  desloratadine (CLARINEX) 5  MG tablet Take 5 mg by mouth daily.   Yes Historical Provider, MD  dicyclomine (BENTYL) 20 MG tablet 20 mg 4 (four) times daily.  04/04/13  Yes Historical Provider, MD  fluticasone (FLONASE) 50 MCG/ACT nasal spray Place 2 sprays into the nose daily.   Yes Historical Provider, MD  lansoprazole (PREVACID) 15 MG capsule Take 15 mg by mouth as needed.   Yes Historical Provider, MD  Linaclotide Rolan Lipa) 145 MCG CAPS capsule Take 1 capsule (145 mcg total) by mouth  daily. 10/20/15  Yes Darren Allen Norris, MD  lisinopril (PRINIVIL,ZESTRIL) 40 MG tablet TAKE 1 TABLET (40 MG TOTAL) BY MOUTH DAILY. 03/14/16  Yes Minna Merritts, MD  magnesium hydroxide (MILK OF MAGNESIA) 400 MG/5ML suspension Take 30 mLs by mouth daily as needed for mild constipation.   Yes Historical Provider, MD  NORVASC 5 MG tablet Take 1 tablet (5 mg total) by mouth daily. 12/24/15  Yes Alisa Graff, FNP  polyethylene glycol (MIRALAX / GLYCOLAX) packet Take 17 g by mouth daily as needed.   Yes Historical Provider, MD  potassium chloride SA (K-DUR,KLOR-CON) 10 MEQ tablet Take 2 tablets (20 mEq total) by mouth 2 (two) times daily as needed. Reported on 12/24/2015 03/10/16  Yes Minna Merritts, MD  pramipexole (MIRAPEX) 0.75 MG tablet Take 0.75 mg by mouth daily.    Yes Historical Provider, MD  selegiline (ELDEPRYL) 5 MG tablet Take 5 mg by mouth 2 (two) times daily with a meal.    Yes Historical Provider, MD  simethicone (MYLICON) 80 MG chewable tablet Takes 3 tablets by mouth daily.   Yes Historical Provider, MD  simvastatin (ZOCOR) 10 MG tablet Take 1 tablet (10 mg total) by mouth daily. 02/15/16  Yes Alisa Graff, FNP  torsemide (DEMADEX) 20 MG tablet Take 2 tablets (40 mg total) by mouth 2 (two) times daily as needed. 03/10/16  Yes Minna Merritts, MD      Review of Systems  Constitutional: Positive for fatigue. Negative for appetite change.  HENT: Positive for congestion. Negative for postnasal drip and sore throat.   Eyes: Negative.   Respiratory: Positive for shortness of breath. Negative for cough, chest tightness and wheezing.   Cardiovascular: Positive for leg swelling. Negative for chest pain and palpitations.  Gastrointestinal: Positive for abdominal pain (at times), constipation and abdominal distention.  Endocrine: Negative.   Genitourinary: Negative.   Musculoskeletal: Negative for back pain and neck pain.  Skin: Negative.   Allergic/Immunologic: Negative.   Neurological: Negative  for dizziness, light-headedness and headaches.  Hematological: Negative for adenopathy. Bruises/bleeds easily.  Psychiatric/Behavioral: Positive for sleep disturbance (trouble sleeping due to abdominal pain) and dysphoric mood (at times). The patient is not nervous/anxious.        Objective:   Physical Exam  Constitutional: He is oriented to person, place, and time. He appears well-developed and well-nourished.  HENT:  Head: Normocephalic and atraumatic.  Eyes: Conjunctivae are normal. Pupils are equal, round, and reactive to light.  Neck: Normal range of motion. Neck supple.  Cardiovascular: Regular rhythm.  Bradycardia present.   Pulmonary/Chest: Effort normal. He has no wheezes. He has no rales.  Abdominal: Soft. He exhibits distension. There is no tenderness.  Musculoskeletal: He exhibits edema (trace edema above the sockline). He exhibits no tenderness.  Neurological: He is alert and oriented to person, place, and time.  Skin: Skin is warm and dry.  Psychiatric: He has a normal mood and affect. His behavior is normal. Thought content normal.  Nursing note and vitals reviewed.   BP 150/75 mmHg  Pulse 60  Resp 18  Ht 6\' 1"  (1.854 m)  Wt 239 lb (108.41 kg)  BMI 31.54 kg/m2  SpO2 97%       Assessment & Plan:  1: Chronic heart failure with preserved ejection fraction- Patient presents with fatigue and shortness of breath upon exertion. He says that his diuretic had been changed to torsemide a couple of weeks ago and he was doing well with the change until he took a linzess and he thinks he may have also missed a diuretic dose and he's had some weight gain. Saw his cardiologist this morning prior to coming to see [place]. He continues to weigh himself and had lost weight until recently. By our scale, he's gained 2 pounds since he was last here on 12/24/15. Reminded to call for an overnight weight gain of >2 pounds or a weekly weight gain of >5 pounds. He is not adding salt to his food and  tries to eat low sodium foods.  2: HTN- Blood pressure mildly elevated but he hasn't taken his medications yet today. Is planning on taking his medications once he returns home. 3: Atrial fibrillation- Currently rate controlled at this time on carvedilol along with apixaban. Follows with cardiology and returns to him in 6 months. 4: Constipation- This is chronic in nature and has been somewhat controlled on linzess which he usually tolerates. However, this last time he took it, he had a sudden bout of shortness of breath with it. Currently has an appointment at [facility] to be established on 07/02/16. 5: Parkinson's- Continues to have tremors in his hands and follows closely with neurology. Sees his neurologist in September 2017.  Medication list was reviewed with the patient.  Return in 3 months or sooner for any questions/problems before then.

## 2016-03-23 NOTE — Progress Notes (Signed)
Patient ID: Matthew Howe, male   DOB: 04/09/1946, 70 y.o.   MRN: [id number] Cardiology Office Note  Date:  03/23/2016   ID:  Matthew Howe 08-12-1946, MRN [id number]  PCP:  Leonel Ramsay, MD   Chief Complaint  Patient presents with  . other    2 week follow up. Meds reviewed by the patient verbally. Pt. c/o shortness of breath.     HPI:  [name] is a very pleasant 70 year old gentleman with diagnosis of Parkinson's who currently works delivering car parts, history of hypertension, cardiac catheterization showing mild CAD after stress test at outpatient facility showed lateral wall hypokinesis on stress echo, hyperlipidemia, s/p pacemaker placement. Found to have atrial fibrillation 11/22/2014 on pacemaker download, started on anticoagulation  prior cardiac catheterization February 2014 with nonobstructive disease Previous hospitalization for paroxysmal atrial fibrillation, acute on chronic diastolic CHF He presents today for weight gain, shortness of breath, leg edema for Follow-up of his chronic diastolic CHF  In follow-up today, he reports having 7 pound weight loss on torsemide 20  mg twice a day, sometimes 3 times a day . He took a linzess for severe constipation and reports that this caused tremendous fluid retention, general malaise . also feels that sometimes he has no energy Unclear if this is from his Parkinson's He and his wife do feel that the torsemide works much better than the Lasix They have not tried 40 mg twice a day Weight on his last clinic visit at home was 242 pounds after diuresis down to 235 pounds and he felt back to normal   EKG on today's visit shows paced rhythm 60 bpm  Other past medical history  presented to the hospital 07/28/2015 with abdominal bloating, shortness of breath Was treated with diureticswith improvement of his symptoms Encouraged to take Lasix daily  leads a busy lifestyle, continues to work most days of the week. Fatigue,  tremor Followed by neurology, Dr. Melrose Nakayama for Parkinson's Previous issues with constipation  He presented to [facility] with bradycardia February 2016. This seemed to improve without intervention and he is asymptomatic. He had recurrent symptoms and we referred him to [facility] for further evaluation. He was seen in the emergency room and found to be in heart block, pacemaker placed urgently   Previous Stress echo leading to cardiac catheterization at [facility].  Stress echo was a limited study, ejection fraction estimated at 45% with EF of 50% at peak stress.  Cardiac catheterization 11/20/2012 with 25% proximal LAD disease, followed by 40% lesion. The cardiac catheterization note makes mention of atrial flutter though arrhythmia is not mentioned in any of the clinic notes.  Previous Echocardiogram shows ejection fraction 50-55%, mild to moderate aortic valve insufficiency, otherwise normal study.  Abdominal CT 12/22/2010 showed acute appendicitis, colonic diverticulosis    PMH:   has a past medical history of Hyperlipidemia; Hypertension; GERD (gastroesophageal reflux disease); Coronary artery disease; Parkinson's disease ([place]); Complete heart block ([place]); AICD (automatic cardioverter/defibrillator) present; Presence of permanent cardiac pacemaker; Heart murmur; CHF (congestive heart failure) ([place]); and Shortness of breath dyspnea.  PSH:    Past Surgical History  Procedure Laterality Date  . Colonoscopy    . Upper gi endoscopy    . Cardiac catheterization  11/20/2012    Known CAD with one vessel coronary disease of left descending artery by cardiac cath.   . Appendectomy  12/2010  . Pacemaker insertion  11-21-14    STJ Assurity dual chamber pacemaker implanted by Dr Rayann Heman for  CHB  . Permanent pacemaker insertion N/A 11/21/2014    Procedure: PERMANENT PACEMAKER INSERTION;  Surgeon: Thompson Grayer, MD;  Location: [facility] CATH LAB;  Service: Cardiovascular;  Laterality: N/A;  . Insert / replace /  remove pacemaker      Current Outpatient Prescriptions  Medication Sig Dispense Refill  . apixaban (ELIQUIS) 5 MG TABS tablet Take 1 tablet (5 mg total) by mouth 2 (two) times daily. 60 tablet 6  . carvedilol (COREG) 6.25 MG tablet Take 1 tablet (6.25 mg total) by mouth 2 (two) times daily with a meal. Take 1 tablet in the morning and 2 tablets each evening 90 tablet 5  . desloratadine (CLARINEX) 5 MG tablet Take 5 mg by mouth daily.    [name] dicyclomine (BENTYL) 20 MG tablet 20 mg 4 (four) times daily.     . fluticasone (FLONASE) 50 MCG/ACT nasal spray Place 2 sprays into the nose daily.    . lansoprazole (PREVACID) 15 MG capsule Take 15 mg by mouth as needed.    [name] lisinopril (PRINIVIL,ZESTRIL) 40 MG tablet TAKE 1 TABLET (40 MG TOTAL) BY MOUTH DAILY. 30 tablet 6  . magnesium hydroxide (MILK OF MAGNESIA) 400 MG/5ML suspension Take 30 mLs by mouth daily as needed for mild constipation.    . NORVASC 5 MG tablet Take 1 tablet (5 mg total) by mouth daily. 30 tablet 5  . polyethylene glycol (MIRALAX / GLYCOLAX) packet Take 17 g by mouth daily as needed.    . potassium chloride SA (K-DUR,KLOR-CON) 10 MEQ tablet Take 2 tablets (20 mEq total) by mouth 2 (two) times daily as needed. Reported on 12/24/2015 120 tablet 6  . pramipexole (MIRAPEX) 0.75 MG tablet Take 0.75 mg by mouth daily.     . selegiline (ELDEPRYL) 5 MG tablet Take 5 mg by mouth 2 (two) times daily with a meal.     . simethicone (MYLICON) 80 MG chewable tablet Takes 3 tablets by mouth daily.    . simvastatin (ZOCOR) 10 MG tablet Take 1 tablet (10 mg total) by mouth daily. 30 tablet 6  . torsemide (DEMADEX) 20 MG tablet Take 2 tablets (40 mg total) by mouth 2 (two) times daily as needed. 120 tablet 6  . Linaclotide (LINZESS) 145 MCG CAPS capsule Take 1 capsule (145 mcg total) by mouth daily. (Patient not taking: Reported on 03/23/2016) 30 capsule 6   No current facility-administered medications for this visit.     Allergies:   Codeine;  Norco; Omeprazole; and Requip    Social History:  The patient  reports that he has never smoked. He has never used smokeless tobacco. He reports that he does not drink alcohol or use illicit drugs.   Family History:   family history includes Colon cancer in his father; Heart disease in his father and mother; Stroke in his sister.    Review of Systems: Review of Systems  Constitutional: Negative.        Weight gain  Respiratory: Positive for shortness of breath.   Cardiovascular: Positive for leg swelling.  Gastrointestinal: Negative.   Musculoskeletal: Negative.   Neurological: Negative.   Psychiatric/Behavioral: Negative.   All other systems reviewed and are negative.    PHYSICAL EXAM: VS:  BP 140/86 mmHg  Pulse 60  Ht 6\' 1"  (1.854 m)  Wt 238 lb 12 oz (108.296 kg)  BMI 31.51 kg/m2  SpO2 97% , BMI Body mass index is 31.51 kg/(m^2). GEN: Well nourished, well developed, in no acute distress HEENT: normal Neck: Unable  to assess JVD, carotid bruits, or masses Cardiac: RRR; no murmurs, rubs, or gallops, trace edema above the sock line Respiratory:  clear to auscultation bilaterally, normal work of breathing GI: soft, nontender, nondistended, + BS MS: no deformity or atrophy Skin: warm and dry, no rash Neuro:  Strength and sensation are intact Psych: euthymic mood, full affect    Recent Labs: 07/29/2015: B Natriuretic Peptide 844.0* 02/01/2016: BUN 20; Creatinine, Ser 1.13; Hemoglobin 14.1; Platelets 213; Potassium 4.1; Sodium 140    Lipid Panel Lab Results  Component Value Date   CHOL 179 07/29/2015   HDL 34* 07/29/2015   LDLCALC 123* 07/29/2015   TRIG 111 07/29/2015      Wt Readings from Last 3 Encounters:  03/23/16 238 lb 12 oz (108.296 kg)  03/10/16 238 lb 1.9 oz (108.011 kg)  02/01/16 234 lb (106.142 kg)       ASSESSMENT AND PLAN:  Paroxysmal atrial fibrillation ([place]) - Plan: EKG 12-Lead Previous pacer download showing mode switches concerning for rare  episodes of atrial tachycardia, 12 episodes noted, longest was 24 seconds. No ventricular episodes. No documentation of high atrial fibrillation burden  SOB (shortness of breath) -  He feels better with weight 235 pounds by his home scale   recent problems after taking linzess for constipation Recommended he continue on the torsemide with goal weight 235 pounds  Acute on chronic diastolic CHF (congestive heart failure) (HCC) Breathing does improve with torsemide, goal weight 235 pounds on his home scale   repeat BMP today  Complete heart block (HCC) Paced rhythm. ventricular pacing possibly contributing to his diastolic CHF  Essential hypertension Blood pressure is well controlled on today's visit. No changes made to the medications.  Hyperlipidemia Cholesterol 180s on low-dose statin Repeat lipid panel on today's visit   Abdominal discomfort Chronic constipation, has appointment with GI doctor in [facility] September 2017  Parkinson's disease [facility]) Managed by neurology, reports symptoms are stable   Disposition:   F/U  6 weeks    Total encounter time more than 15 minutes  Greater than 50% was spent in counseling and coordination of care with the patient    Orders Placed This Encounter  Procedures  . EKG 12-Lead    Signed, Esmond Plants, M.D., Ph.D. 03/23/2016  [place], [place]

## 2016-03-23 NOTE — Patient Instructions (Addendum)
You are doing well. No medication changes were made.  We will check BMP, liver, lipid today  Please call us if you have new issues that need to be addressed before your next appt.  Your physician wants you to follow-up in: 6 months.  You will receive a reminder letter in the mail two months in advance. If you don't receive a letter, please call our office to schedule the follow-up appointment.

## 2016-03-24 LAB — HEPATIC FUNCTION PANEL
ALBUMIN: 4.1 g/dL (ref 3.6–4.8)
ALT: 19 IU/L (ref 0–44)
AST: 20 IU/L (ref 0–40)
Alkaline Phosphatase: 74 IU/L (ref 39–117)
BILIRUBIN TOTAL: 0.6 mg/dL (ref 0.0–1.2)
Bilirubin, Direct: 0.18 mg/dL (ref 0.00–0.40)
TOTAL PROTEIN: 6.7 g/dL (ref 6.0–8.5)

## 2016-03-24 LAB — BASIC METABOLIC PANEL
BUN / CREAT RATIO: 20 (ref 10–24)
BUN: 25 mg/dL (ref 8–27)
CO2: 22 mmol/L (ref 18–29)
CREATININE: 1.23 mg/dL (ref 0.76–1.27)
Calcium: 8.8 mg/dL (ref 8.6–10.2)
Chloride: 103 mmol/L (ref 96–106)
GFR, EST AFRICAN AMERICAN: 69 mL/min/{1.73_m2} (ref >59)
GFR, EST NON AFRICAN AMERICAN: 60 mL/min/{1.73_m2} (ref >59)
Glucose: 118 mg/dL — ABNORMAL HIGH (ref 65–99)
Potassium: 4.4 mmol/L (ref 3.5–5.2)
SODIUM: 145 mmol/L — AB (ref 134–144)

## 2016-03-24 LAB — LIPID PANEL
CHOL/HDL RATIO: 4.1 ratio (ref 0.0–5.0)
Cholesterol, Total: 188 mg/dL (ref 100–199)
HDL: 46 mg/dL (ref >39)
LDL CALC: 114 mg/dL — AB (ref 0–99)
TRIGLYCERIDES: 138 mg/dL (ref 0–149)
VLDL Cholesterol Cal: 28 mg/dL (ref 5–40)

## 2016-03-25 ENCOUNTER — Ambulatory Visit: Payer: Medicare Other | Admitting: Family

## 2016-03-29 ENCOUNTER — Encounter: Payer: Self-pay | Admitting: Cardiology

## 2016-04-22 ENCOUNTER — Other Ambulatory Visit: Payer: Self-pay

## 2016-04-22 MED ORDER — APIXABAN 5 MG PO TABS: 5.0000 mg | ORAL_TABLET | Freq: Two times a day (BID) | ORAL | Status: DC

## 2016-05-06 ENCOUNTER — Encounter: Payer: Self-pay | Admitting: Internal Medicine

## 2016-05-17 ENCOUNTER — Telehealth: Payer: Self-pay | Admitting: *Deleted

## 2016-05-17 MED ORDER — CARVEDILOL 12.5 MG PO TABS: 12.5000 mg | ORAL_TABLET | Freq: Two times a day (BID) | ORAL | 3 refills | Status: DC

## 2016-05-17 NOTE — Telephone Encounter (Signed)
LMOM to return call to device clinic. 

## 2016-05-17 NOTE — Telephone Encounter (Signed)
[name] returning call. Made aware of episode 05/05/16 about 3pm. He doesn't recall symptoms but reports that he has been working hard outside in the heat. He denies dizziness or syncope. I have advised him to keep hydrated and to increase his Coreg per Dr. Rayann Heman to 12.5mg  twice daily. He verbalizes understanding.

## 2016-05-31 ENCOUNTER — Ambulatory Visit (INDEPENDENT_AMBULATORY_CARE_PROVIDER_SITE_OTHER): Payer: Medicare Other | Admitting: *Deleted

## 2016-05-31 DIAGNOSIS — Z950 Presence of cardiac pacemaker: Secondary | ICD-10-CM | POA: Diagnosis not present

## 2016-05-31 DIAGNOSIS — I442 Atrioventricular block, complete: Secondary | ICD-10-CM

## 2016-05-31 NOTE — Progress Notes (Signed)
Remote pacemaker transmission.   

## 2016-06-08 ENCOUNTER — Encounter: Payer: Self-pay | Admitting: Cardiology

## 2016-06-10 LAB — CUP PACEART REMOTE DEVICE CHECK
Battery Remaining Longevity: 122 mo
Brady Statistic AP VP Percent: 32 %
Brady Statistic AP VS Percent: 1 %
Brady Statistic RA Percent Paced: 31 %
Brady Statistic RV Percent Paced: 99 %
Date Time Interrogation Session: 20170822064127
Implantable Lead Implant Date: 20160212
Implantable Lead Location: 753859
Implantable Lead Model: 1948
Lead Channel Impedance Value: 580 Ohm
Lead Channel Pacing Threshold Amplitude: 1.75 V
Lead Channel Pacing Threshold Pulse Width: 0.4 ms
Lead Channel Sensing Intrinsic Amplitude: 12 mV
Lead Channel Setting Pacing Amplitude: 1.625
Lead Channel Setting Pacing Pulse Width: 0.4 ms
Lead Channel Setting Sensing Sensitivity: 4 mV
MDC IDC LEAD IMPLANT DT: 20160212
MDC IDC LEAD LOCATION: 753860
MDC IDC MSMT BATTERY REMAINING PERCENTAGE: 95.5 %
MDC IDC MSMT BATTERY VOLTAGE: 2.99 V
MDC IDC MSMT LEADCHNL RA IMPEDANCE VALUE: 450 Ohm
MDC IDC MSMT LEADCHNL RA PACING THRESHOLD AMPLITUDE: 0.625 V
MDC IDC MSMT LEADCHNL RA SENSING INTR AMPL: 2.9 mV
MDC IDC MSMT LEADCHNL RV PACING THRESHOLD PULSEWIDTH: 0.4 ms
MDC IDC SET LEADCHNL RV PACING AMPLITUDE: 2 V
MDC IDC STAT BRADY AS VP PERCENT: 68 %
MDC IDC STAT BRADY AS VS PERCENT: 1 %
Pulse Gen Model: 2240
Pulse Gen Serial Number: 3049931

## 2016-06-22 DIAGNOSIS — K5900 Constipation, unspecified: Secondary | ICD-10-CM | POA: Diagnosis not present

## 2016-06-22 DIAGNOSIS — R140 Abdominal distension (gaseous): Secondary | ICD-10-CM | POA: Diagnosis not present

## 2016-06-22 DIAGNOSIS — G20 Parkinson's disease: Secondary | ICD-10-CM | POA: Diagnosis not present

## 2016-06-22 DIAGNOSIS — I480 Paroxysmal atrial fibrillation: Secondary | ICD-10-CM | POA: Diagnosis not present

## 2016-06-22 DIAGNOSIS — Z7901 Long term (current) use of anticoagulants: Secondary | ICD-10-CM | POA: Diagnosis not present

## 2016-06-23 ENCOUNTER — Ambulatory Visit: Payer: Medicare Other | Attending: Family | Admitting: Family

## 2016-06-23 ENCOUNTER — Encounter: Payer: Self-pay | Admitting: Family

## 2016-06-23 ENCOUNTER — Telehealth: Payer: Self-pay | Admitting: Cardiovascular Disease

## 2016-06-23 VITALS — BP 142/64 | HR 62 | Resp 18 | Ht 73.0 in | Wt 250.0 lb

## 2016-06-23 DIAGNOSIS — Z7901 Long term (current) use of anticoagulants: Secondary | ICD-10-CM | POA: Insufficient documentation

## 2016-06-23 DIAGNOSIS — I480 Paroxysmal atrial fibrillation: Secondary | ICD-10-CM | POA: Insufficient documentation

## 2016-06-23 DIAGNOSIS — K5909 Other constipation: Secondary | ICD-10-CM

## 2016-06-23 DIAGNOSIS — Z79899 Other long term (current) drug therapy: Secondary | ICD-10-CM | POA: Diagnosis not present

## 2016-06-23 DIAGNOSIS — I509 Heart failure, unspecified: Secondary | ICD-10-CM | POA: Diagnosis present

## 2016-06-23 DIAGNOSIS — M25562 Pain in left knee: Secondary | ICD-10-CM | POA: Diagnosis not present

## 2016-06-23 DIAGNOSIS — I5032 Chronic diastolic (congestive) heart failure: Secondary | ICD-10-CM | POA: Diagnosis not present

## 2016-06-23 DIAGNOSIS — I110 Hypertensive heart disease with heart failure: Secondary | ICD-10-CM | POA: Diagnosis not present

## 2016-06-23 DIAGNOSIS — I10 Essential (primary) hypertension: Secondary | ICD-10-CM

## 2016-06-23 NOTE — Patient Instructions (Signed)
Continue weighing daily and call for an overnight weight gain of > 2 pounds or a weekly weight gain of >5 pounds. 

## 2016-06-23 NOTE — Telephone Encounter (Signed)
Received cardiac clearance request for pt to proceed w/ colonoscopy w/ moderate anesthesia w/ Dr. Vida Roller @ [facility]. DOS has not been scheduled, pending this clearance. They request recommendations on holding Eliquis prior to procedure.  Please route clearance and recommendations to [facility] @ [phone].

## 2016-06-23 NOTE — Progress Notes (Signed)
Subjective:    Patient ID: Matthew Howe, male    DOB: 06-27-1946, 70 y.o.   MRN: [id number]  Congestive Heart Failure  Presents for initial visit. The disease course has been stable. Associated symptoms include edema, fatigue and shortness of breath (minimal). Pertinent negatives include no abdominal pain, chest pain, orthopnea or palpitations. The symptoms have been stable. Past treatments include beta blockers, ACE inhibitors and salt and fluid restriction. The treatment provided moderate relief. Compliance with prior treatments has been good. His past medical history is significant for CAD and HTN. There is no history of CVA or DM. He has multiple 1st degree relatives with heart disease.  Hypertension  This is a chronic problem. The current episode started more than 1 year ago. The problem is unchanged. The problem is controlled. Associated symptoms include peripheral edema and shortness of breath (minimal). Pertinent negatives include no chest pain or palpitations. There are no associated agents to hypertension. Risk factors for coronary artery disease include family history, dyslipidemia and male gender. Past treatments include beta blockers, ACE inhibitors, diuretics and lifestyle changes. The current treatment provides moderate improvement. Compliance problems include exercise.  Hypertensive end-organ damage includes heart failure.   Past Medical History:  Diagnosis Date  . AICD (automatic cardioverter/defibrillator) present   . CHF (congestive heart failure) ([place])   . Complete heart block ([place])    a. s/p St[employer] Jude PPM 11/2014.  [name] Coronary artery disease   . GERD (gastroesophageal reflux disease)   . Heart murmur   . Hyperlipidemia   . Hypertension   . Parkinson's disease ([place])   . Presence of permanent cardiac pacemaker   . Shortness of breath dyspnea     Past Surgical History:  Procedure Laterality Date  . APPENDECTOMY  12/2010  . CARDIAC CATHETERIZATION  11/20/2012   Known CAD  with one vessel coronary disease of left descending artery by cardiac cath.   . COLONOSCOPY    . INSERT / REPLACE / REMOVE PACEMAKER    . PACEMAKER INSERTION  11-21-14   STJ Assurity dual chamber pacemaker implanted by Dr Rayann Heman for CHB  . PERMANENT PACEMAKER INSERTION N/A 11/21/2014   Procedure: PERMANENT PACEMAKER INSERTION;  Surgeon: Thompson Grayer, MD;  Location: [facility] CATH LAB;  Service: Cardiovascular;  Laterality: N/A;  . UPPER GI ENDOSCOPY      Family History  Problem Relation Age of Onset  . Heart disease Mother   . Heart disease Father   . Colon cancer Father   . Stroke Sister     Social History  Substance Use Topics  . Smoking status: Never Smoker  . Smokeless tobacco: Never Used  . Alcohol use No    Allergies  Allergen Reactions  . Linzess [Linaclotide] Shortness Of Breath  . Amitiza [Lubiprostone] Other (See Comments)    Abdominal cramps  . Codeine   . Norco [Hydrocodone-Acetaminophen] Diarrhea and Nausea And Vomiting  . Omeprazole Diarrhea and Nausea And Vomiting  . Requip  [Ropinirole] Nausea Only    Prior to Admission medications   Medication Sig Start Date End Date Taking? Authorizing Provider  apixaban (ELIQUIS) 5 MG TABS tablet Take 1 tablet (5 mg total) by mouth 2 (two) times daily. 04/22/16  Yes Alisa Graff, FNP  bisacodyl (BISACODYL) 5 MG EC tablet Take 5 mg by mouth every other day.   Yes Historical Provider, MD  carvedilol (COREG) 12.5 MG tablet Take 1 tablet (12.5 mg total) by mouth 2 (two) times daily. 05/17/16 08/15/16 Yes Jeneen Rinks  Allred, MD  chlorpheniramine-HYDROcodone (TUSSIONEX PENNKINETIC ER) 10-8 MG/5ML SUER Take 5 mLs by mouth every 12 (twelve) hours as needed for cough.   Yes Historical Provider, MD  desloratadine (CLARINEX) 5 MG tablet Take 5 mg by mouth daily.   Yes Historical Provider, MD  dicyclomine (BENTYL) 20 MG tablet 20 mg 4 (four) times daily.  04/04/13  Yes Historical Provider, MD  fluticasone (FLONASE) 50 MCG/ACT nasal spray Place 2  sprays into the nose daily.   Yes Historical Provider, MD  lansoprazole (PREVACID) 15 MG capsule Take 15 mg by mouth as needed.   Yes Historical Provider, MD  lisinopril (PRINIVIL,ZESTRIL) 40 MG tablet Take 40 mg by mouth daily.    Yes Historical Provider, MD  polyethylene glycol (MIRALAX / GLYCOLAX) packet Take 17 g by mouth daily.    Yes Historical Provider, MD  potassium chloride SA (K-DUR,KLOR-CON) 10 MEQ tablet Take 2 tablets (20 mEq total) by mouth 2 (two) times daily as needed. Reported on 12/24/2015 03/10/16  Yes Minna Merritts, MD  pramipexole (MIRAPEX) 0.75 MG tablet Take 0.75 mg by mouth daily.    Yes Historical Provider, MD  selegiline (ELDEPRYL) 5 MG tablet Take 5 mg by mouth 2 (two) times daily with a meal.    Yes Historical Provider, MD  simvastatin (ZOCOR) 10 MG tablet Take 1 tablet (10 mg total) by mouth daily. 02/15/16  Yes Alisa Graff, FNP  torsemide (DEMADEX) 20 MG tablet Take 2 tablets (40 mg total) by mouth 2 (two) times daily as needed. 03/10/16  Yes Minna Merritts, MD       Review of Systems  Constitutional: Positive for appetite change and fatigue.  HENT: Negative for congestion, postnasal drip and sore throat.   Eyes: Negative.   Respiratory: Positive for shortness of breath (minimal). Negative for chest tightness.   Cardiovascular: Positive for leg swelling. Negative for chest pain and palpitations.  Gastrointestinal: Positive for abdominal distention and constipation. Negative for abdominal pain.  Endocrine: Negative.   Genitourinary: Negative.   Musculoskeletal: Positive for arthralgias (left knee) and back pain.  Skin: Negative.   Allergic/Immunologic: Negative.   Neurological: Positive for tremors. Negative for dizziness and light-headedness.  Hematological: Negative for adenopathy. Bruises/bleeds easily.  Psychiatric/Behavioral: Negative for dysphoric mood and sleep disturbance (sleeping on 2 pillows). The patient is not nervous/anxious.         Objective:   Physical Exam  Constitutional: He is oriented to person, place, and time. He appears well-developed and well-nourished.  HENT:  Head: Normocephalic and atraumatic.  Eyes: Conjunctivae are normal. Pupils are equal, round, and reactive to light.  Neck: Normal range of motion. Neck supple.  Cardiovascular: Normal rate and regular rhythm.   Pulmonary/Chest: Effort normal. He has no wheezes. He has no rales.  Abdominal: He exhibits distension. There is no tenderness.  Musculoskeletal: He exhibits edema (1+ pitting edema in bilateral lower legs). He exhibits no tenderness.  Neurological: He is alert and oriented to person, place, and time.  Skin: Skin is warm and dry.  Psychiatric: He has a normal mood and affect. His behavior is normal. Thought content normal.  Nursing note and vitals reviewed.   BP (!) 142/64   Pulse 62   Resp 18   Ht 6\' 1"  (1.854 m)   Wt 250 lb (113.4 kg)   SpO2 96%   BMI 32.98 kg/m        Assessment & Plan:  1: Chronic heart failure with preserved ejection fraction- Patient presents with  fatigue and shortness of breath with moderate exertion (Class II) which does improve quickly upon rest. Minimal fluid overload today. He continues to weigh himself daily and feels like the torsemide has been working better than the furosemide. By our scale, he's gained 11.8 pounds since he was last here on 03/23/16 although he says that he's quite constipated right now. Reminded to call for an overnight weight gain of >2 pounds or a weekly weight gain of >5 pounds. He is not adding any salt to his food and closely reads food labels. He hasn't been exercising lately due to left knee pain and leg cramps. 2: HTN- Blood pressure looks good today. 3: Atrial fibrillation- Currently rate controlled at this time. Taking carvedilol along with apixaban.  4: Left knee pain- Patient says that his left knee has been bothering him quite a bit at times making it difficult to walk as  much as he was. He sees his PCP on 06/27/16 and is going to talk with him about his knee pain.  5: Constipation- He is now being followed by a gastroenterologist at [facility] and has had some medication changes. Is scheduled for a colonoscopy February 2018.  Medication list was reviewed.  Return here in 6 months or sooner for any questions/problems before then.

## 2016-06-26 NOTE — Telephone Encounter (Signed)
Acceptable risk for surgery Would hold eliquis for 2 days prior to procedure Restart 24 hr after procedure

## 2016-06-27 DIAGNOSIS — I10 Essential (primary) hypertension: Secondary | ICD-10-CM | POA: Diagnosis not present

## 2016-06-27 DIAGNOSIS — K219 Gastro-esophageal reflux disease without esophagitis: Secondary | ICD-10-CM | POA: Diagnosis not present

## 2016-06-27 DIAGNOSIS — E7800 Pure hypercholesterolemia, unspecified: Secondary | ICD-10-CM | POA: Diagnosis not present

## 2016-06-27 DIAGNOSIS — I2510 Atherosclerotic heart disease of native coronary artery without angina pectoris: Secondary | ICD-10-CM | POA: Diagnosis not present

## 2016-06-27 DIAGNOSIS — R252 Cramp and spasm: Secondary | ICD-10-CM | POA: Diagnosis not present

## 2016-06-27 NOTE — Telephone Encounter (Signed)
Routed to fax # provided. 

## 2016-06-28 DIAGNOSIS — G20 Parkinson's disease: Secondary | ICD-10-CM | POA: Diagnosis not present

## 2016-08-18 DIAGNOSIS — H2513 Age-related nuclear cataract, bilateral: Secondary | ICD-10-CM | POA: Diagnosis not present

## 2016-08-18 DIAGNOSIS — H4323 Crystalline deposits in vitreous body, bilateral: Secondary | ICD-10-CM | POA: Diagnosis not present

## 2016-08-30 ENCOUNTER — Ambulatory Visit (INDEPENDENT_AMBULATORY_CARE_PROVIDER_SITE_OTHER): Payer: Medicare Other | Admitting: *Deleted

## 2016-08-30 DIAGNOSIS — I442 Atrioventricular block, complete: Secondary | ICD-10-CM | POA: Diagnosis not present

## 2016-08-30 NOTE — Progress Notes (Signed)
Remote pacemaker transmission.   

## 2016-09-07 ENCOUNTER — Other Ambulatory Visit: Payer: Self-pay | Admitting: Family

## 2016-09-07 MED ORDER — SIMVASTATIN 10 MG PO TABS: 10.0000 mg | ORAL_TABLET | Freq: Every day | ORAL | 6 refills | Status: DC

## 2016-09-08 ENCOUNTER — Encounter: Payer: Self-pay | Admitting: Cardiology

## 2016-09-20 ENCOUNTER — Ambulatory Visit (INDEPENDENT_AMBULATORY_CARE_PROVIDER_SITE_OTHER): Payer: Medicare Other | Admitting: Cardiovascular Disease

## 2016-09-20 ENCOUNTER — Encounter: Payer: Self-pay | Admitting: Cardiovascular Disease

## 2016-09-20 VITALS — BP 128/74 | HR 60 | Ht 73.0 in | Wt 256.5 lb

## 2016-09-20 DIAGNOSIS — I10 Essential (primary) hypertension: Secondary | ICD-10-CM | POA: Diagnosis not present

## 2016-09-20 DIAGNOSIS — R0602 Shortness of breath: Secondary | ICD-10-CM

## 2016-09-20 DIAGNOSIS — I2510 Atherosclerotic heart disease of native coronary artery without angina pectoris: Secondary | ICD-10-CM

## 2016-09-20 DIAGNOSIS — I480 Paroxysmal atrial fibrillation: Secondary | ICD-10-CM

## 2016-09-20 DIAGNOSIS — G20 Parkinson's disease: Secondary | ICD-10-CM

## 2016-09-20 DIAGNOSIS — E782 Mixed hyperlipidemia: Secondary | ICD-10-CM

## 2016-09-20 DIAGNOSIS — I5032 Chronic diastolic (congestive) heart failure: Secondary | ICD-10-CM

## 2016-09-20 DIAGNOSIS — I442 Atrioventricular block, complete: Secondary | ICD-10-CM | POA: Diagnosis not present

## 2016-09-20 DIAGNOSIS — G20A1 Parkinson's disease without dyskinesia, without mention of fluctuations: Secondary | ICD-10-CM

## 2016-09-20 NOTE — Progress Notes (Signed)
Cardiology Office Note  Date:  09/20/2016   ID:  Brunson, [place] [birth year], MRN [id number]  PCP:  Leonel Ramsay, MD   Chief Complaint  Patient presents with  . other    6 month f/u fluid retention. Meds reviewed verbally with pt.    HPI:  Mr. Geiling is a very pleasant 70 year old gentleman with diagnosis of Parkinson's who currently works delivering car parts, history of hypertension, cardiac catheterization showing mild CAD after stress test at outpatient facility showed lateral wall hypokinesis on stress echo, hyperlipidemia, s/p pacemaker placement. Found to have atrial fibrillation 11/22/2014 on pacemaker download, started on anticoagulation  prior cardiac catheterization February 2014 with nonobstructive disease Previous hospitalization for paroxysmal atrial fibrillation, acute on chronic diastolic CHF He presents today for weight gain, shortness of breath, leg edema for Follow-up of his chronic diastolic CHF  In follow-up today his weight is up at least 10 pounds from prior clinic visit secondary to dietary indiscretion. Eating lots of desserts, less exercise Takes torsemide 20 mg TID with potassium TID Last BMP 3 months ago, creatinine and BUN slightly up, potassium stable  Denies any chest pain on exertion He does have shortness of breath with anything that he needs to do, attributes this to his weight gain  Reports having no energy in the morning, symptoms are bad, blames his Parkinson's Seems to get slightly better as the day goes on and he takes his morning medications  Previous weight 242 pounds, after diuresis 235 pounds, felt normal Weight on today's visit 256.8 pounds, again overeating Reports blood pressure stable  Scheduled for [place] at [facility] Feb 1st   EKG on today's visit shows paced rhythm 60 bpm  Other past medical history  presented to the hospital 07/28/2015 with abdominal bloating, shortness of breath Was treated with diureticswith improvement  of his symptoms Encouraged to take Lasix daily  leads a busy lifestyle, continues to work most days of the week. Fatigue, tremor Followed by neurology, Dr. Melrose Nakayama for Parkinson's Previous issues with constipation  He presented to [facility] with bradycardia February 2016. This seemed to improve without intervention and he is asymptomatic. He had recurrent symptoms and we referred him to [facility] for further evaluation. He was seen in the emergency room and found to be in heart block, pacemaker placed urgently   Previous Stress echo leading to cardiac catheterization at [facility].  Stress echo was a limited study, ejection fraction estimated at 45% with EF of 50% at peak stress.  Cardiac catheterization 11/20/2012 with 25% proximal LAD disease, followed by 40% lesion. The cardiac catheterization note makes mention of atrial flutter though arrhythmia is not mentioned in any of the clinic notes.  Previous Echocardiogram shows ejection fraction 50-55%, mild to moderate aortic valve insufficiency, otherwise normal study.  Abdominal CT 12/22/2010 showed acute appendicitis, colonic diverticulosis    PMH:   has a past medical history of AICD (automatic cardioverter/defibrillator) present; CHF (congestive heart failure) ([place]); Complete heart block ([place]); Coronary artery disease; GERD (gastroesophageal reflux disease); Heart murmur; Hyperlipidemia; Hypertension; Parkinson's disease ([place]); Presence of permanent cardiac pacemaker; and Shortness of breath dyspnea.  PSH:    Past Surgical History:  Procedure Laterality Date  . APPENDECTOMY  12/2010  . CARDIAC CATHETERIZATION  11/20/2012   Known CAD with one vessel coronary disease of left descending artery by cardiac cath.   . COLONOSCOPY    . INSERT / REPLACE / REMOVE PACEMAKER    . PACEMAKER INSERTION  11-21-14   STJ Assurity dual  chamber pacemaker implanted by Dr Rayann Heman for CHB  . PERMANENT PACEMAKER INSERTION N/A 11/21/2014   Procedure:  PERMANENT PACEMAKER INSERTION;  Surgeon: Thompson Grayer, MD;  Location: [facility] CATH LAB;  Service: Cardiovascular;  Laterality: N/A;  . UPPER GI ENDOSCOPY      Current Outpatient Prescriptions  Medication Sig Dispense Refill  . apixaban (ELIQUIS) 5 MG TABS tablet Take 1 tablet (5 mg total) by mouth 2 (two) times daily. 60 tablet 6  . bisacodyl (BISACODYL) 5 MG EC tablet Take 5 mg by mouth every other day.    . carvedilol (COREG) 12.5 MG tablet Take 1 tablet (12.5 mg total) by mouth 2 (two) times daily. 180 tablet 3  . chlorpheniramine-HYDROcodone (TUSSIONEX PENNKINETIC ER) 10-8 MG/5ML SUER Take 5 mLs by mouth every 12 (twelve) hours as needed for cough.    . desloratadine (CLARINEX) 5 MG tablet Take 5 mg by mouth daily.    [name] dicyclomine (BENTYL) 20 MG tablet 20 mg 4 (four) times daily.     . fluticasone (FLONASE) 50 MCG/ACT nasal spray Place 2 sprays into the nose daily.    . lansoprazole (PREVACID) 15 MG capsule Take 15 mg by mouth as needed.    [name] lisinopril (PRINIVIL,ZESTRIL) 40 MG tablet Take 40 mg by mouth daily.     . polyethylene glycol (MIRALAX / GLYCOLAX) packet Take 17 g by mouth daily.     . potassium chloride SA (K-DUR,KLOR-CON) 10 MEQ tablet Take 2 tablets (20 mEq total) by mouth 2 (two) times daily as needed. Reported on 12/24/2015 120 tablet 6  . pramipexole (MIRAPEX) 0.75 MG tablet Take 0.75 mg by mouth daily.     . selegiline (ELDEPRYL) 5 MG tablet Take 5 mg by mouth 2 (two) times daily with a meal.     . simvastatin (ZOCOR) 10 MG tablet Take 1 tablet (10 mg total) by mouth daily. 30 tablet 6  . torsemide (DEMADEX) 20 MG tablet Take 2 tablets (40 mg total) by mouth 2 (two) times daily as needed. 120 tablet 6   No current facility-administered medications for this visit.      Allergies:   Linzess [linaclotide]; Amitiza [lubiprostone]; Codeine; Norco [hydrocodone-acetaminophen]; Omeprazole; and Requip  [ropinirole]   Social History:  The patient  reports that he has never smoked.  He has never used smokeless tobacco. He reports that he does not drink alcohol or use drugs.   Family History:   family history includes Colon cancer in his father; Heart disease in his father and mother; Stroke in his sister.    Review of Systems: Review of Systems  Constitutional: Positive for malaise/fatigue.       Weight gain  Respiratory: Positive for shortness of breath.   Cardiovascular: Negative.   Gastrointestinal: Negative.   Musculoskeletal: Negative.   Neurological: Negative.   Psychiatric/Behavioral: Negative.   All other systems reviewed and are negative.    PHYSICAL EXAM: VS:  BP 128/74 (BP Location: Left Arm, Patient Position: Sitting, Cuff Size: Large)   Pulse 60   Ht 6\' 1"  (1.854 m)   Wt 256 lb 8 oz (116.3 kg)   BMI 33.84 kg/m  , BMI Body mass index is 33.84 kg/m. GEN: Well nourished, well developed, in no acute distress , Obese  HEENT: normal  Neck: no JVD, carotid bruits, or masses Cardiac: RRR; no murmurs, rubs, or gallops,no edema  Respiratory:  clear to auscultation bilaterally, normal work of breathing GI: soft, nontender, nondistended, + BS MS: no deformity or atrophy  Skin: warm and dry, no rash Neuro:  Strength and sensation are intact Psych: euthymic mood, full affect    Recent Labs: 02/01/2016: Hemoglobin 14.1; Platelets 213 03/23/2016: ALT 19; BUN 25; Creatinine, Ser 1.23; Potassium 4.4; Sodium 145    Lipid Panel Lab Results  Component Value Date   CHOL 188 03/23/2016   HDL 46 03/23/2016   LDLCALC 114 (H) 03/23/2016   TRIG 138 03/23/2016      Wt Readings from Last 3 Encounters:  09/20/16 256 lb 8 oz (116.3 kg)  06/23/16 250 lb (113.4 kg)  03/23/16 239 lb (108.4 kg)       ASSESSMENT AND PLAN:  Essential hypertension - Plan: EKG XX123456, Basic Metabolic Panel (BMET) Blood pressure is well controlled on today's visit. No changes made to the medications.  Paroxysmal atrial fibrillation (HCC) - Plan: EKG XX123456, Basic  Metabolic Panel (BMET) Last pacer download was having 3% A. fib burden On anticoagulation, asymptomatic  Coronary artery disease involving native coronary artery of native heart without angina pectoris Currently with no symptoms of angina. No further workup at this time. Continue current medication regimen.  Complete heart block ([place]) Pacemaker in place, followed by Dr. Caryl Comes  Mixed hyperlipidemia Encouraged him to stay on his low-dose cholesterol medication  Chronic diastolic heart failure ([place]) BMP today, reports taking torsemide with potassium 3 times a day No leg swelling on exam, lungs clear, appears euvolemic  Parkinson's disease ([place]) Trouble in the morning, very slow to get going Suggested he talk with Dr. Melrose Nakayama  Morbid obesity [facility]) We have encouraged continued exercise, careful diet management in an effort to lose weight. Dietary instructions provided  Shortness of breath Likely secondary to weight gain, deconditioning   Total encounter time more than 25 minutes  Greater than 50% was spent in counseling and coordination of care with the patient   Disposition:   F/U  6 months   Orders Placed This Encounter  Procedures  . Basic Metabolic Panel (BMET)  . EKG 12-Lead     Signed, Esmond Plants, M.D., Ph.D. 09/20/2016  [place], [place]

## 2016-09-20 NOTE — Patient Instructions (Addendum)
Medication Instructions:   No medication changes made  Stop eliquis 2 days before the colonoscopy  Ask Dr. Melrose Nakayama about medication in the Am, such as sinemet  Labwork:  BMP today  Testing/Procedures:  No further testing at this time   I recommend watching educational videos on topics of interest to you at:       [email or web address].[email or web address]  Enter code: [email or web address]    Follow-Up: It was a pleasure seeing you in the office today. Please call us if you have new issues that need to be addressed before your next appt.  [phone]  Your physician wants you to follow-up in: 6 months.  You will receive a reminder letter in the mail two months in advance. If you don't receive a letter, please call our office to schedule the follow-up appointment.  If you need a refill on your cardiac medications before your next appointment, please call your pharmacy.

## 2016-09-21 LAB — BASIC METABOLIC PANEL
BUN / CREAT RATIO: 19 (ref 10–24)
BUN: 25 mg/dL (ref 8–27)
CHLORIDE: 102 mmol/L (ref 96–106)
CO2: 25 mmol/L (ref 18–29)
Calcium: 9.2 mg/dL (ref 8.6–10.2)
Creatinine, Ser: 1.33 mg/dL — ABNORMAL HIGH (ref 0.76–1.27)
GFR calc non Af Amer: 54 mL/min/{1.73_m2} — ABNORMAL LOW (ref >59)
GFR, EST AFRICAN AMERICAN: 63 mL/min/{1.73_m2} (ref >59)
GLUCOSE: 93 mg/dL (ref 65–99)
Potassium: 5.1 mmol/L (ref 3.5–5.2)
SODIUM: 142 mmol/L (ref 134–144)

## 2016-09-22 LAB — CUP PACEART REMOTE DEVICE CHECK
Battery Voltage: 2.99 V
Brady Statistic AP VP Percent: 34 %
Brady Statistic AS VS Percent: 1 %
Brady Statistic RA Percent Paced: 33 %
Brady Statistic RV Percent Paced: 99 %
Implantable Lead Implant Date: 20160212
Implantable Lead Model: 1948
Implantable Pulse Generator Implant Date: 20160212
Lead Channel Impedance Value: 580 Ohm
Lead Channel Pacing Threshold Amplitude: 0.75 V
Lead Channel Pacing Threshold Pulse Width: 0.4 ms
Lead Channel Pacing Threshold Pulse Width: 0.4 ms
Lead Channel Setting Pacing Amplitude: 1.75 V
Lead Channel Setting Sensing Sensitivity: 4 mV
MDC IDC LEAD IMPLANT DT: 20160212
MDC IDC LEAD LOCATION: 753859
MDC IDC LEAD LOCATION: 753860
MDC IDC MSMT BATTERY REMAINING LONGEVITY: 124 mo
MDC IDC MSMT BATTERY REMAINING PERCENTAGE: 95.5 %
MDC IDC MSMT LEADCHNL RA IMPEDANCE VALUE: 450 Ohm
MDC IDC MSMT LEADCHNL RA SENSING INTR AMPL: 2.7 mV
MDC IDC MSMT LEADCHNL RV PACING THRESHOLD AMPLITUDE: 1.5 V
MDC IDC MSMT LEADCHNL RV SENSING INTR AMPL: 12 mV
MDC IDC PG SERIAL: 3049931
MDC IDC SESS DTM: 20171121092304
MDC IDC SET LEADCHNL RA PACING AMPLITUDE: 1.75 V
MDC IDC SET LEADCHNL RV PACING PULSEWIDTH: 0.4 ms
MDC IDC STAT BRADY AP VS PERCENT: 1 %
MDC IDC STAT BRADY AS VP PERCENT: 66 %

## 2016-10-10 HISTORY — PX: COLONOSCOPY: SHX174

## 2016-10-13 ENCOUNTER — Other Ambulatory Visit: Payer: Self-pay | Admitting: Family

## 2016-10-13 MED ORDER — LISINOPRIL 40 MG PO TABS: 40.0000 mg | ORAL_TABLET | Freq: Every day | ORAL | 6 refills | Status: DC

## 2016-10-19 ENCOUNTER — Telehealth: Payer: Self-pay | Admitting: Family

## 2016-10-19 NOTE — Telephone Encounter (Signed)
Patient called to say that he's been experiencing worsening muscle weakness in both of his legs where it's "bringing him down". Denies any recent falls. He remains quite active with walking and working when he can. He was concerned that maybe his cholesterol medication could be contributing to this.  Discussed that statins can sometimes contribute to muscle aches/weakness but that his mirapex could possibly be contributing to it as well. He takes mirapex for his parkinson's. Advised him to call his neurologist (Dr. Melrose Nakayama) to let him know about the muscle weakness as his parkinson's could be advancing as well.  Patient was appreciative of the advice and will call Dr. Lannie Fields office today.

## 2016-11-15 ENCOUNTER — Encounter: Payer: Self-pay | Admitting: Internal Medicine

## 2016-11-15 ENCOUNTER — Ambulatory Visit (INDEPENDENT_AMBULATORY_CARE_PROVIDER_SITE_OTHER): Payer: Medicare Other | Admitting: Internal Medicine

## 2016-11-15 VITALS — BP 130/68 | HR 70 | Ht 73.0 in | Wt 258.5 lb

## 2016-11-15 DIAGNOSIS — Z950 Presence of cardiac pacemaker: Secondary | ICD-10-CM | POA: Diagnosis not present

## 2016-11-15 DIAGNOSIS — Z01812 Encounter for preprocedural laboratory examination: Secondary | ICD-10-CM | POA: Diagnosis not present

## 2016-11-15 DIAGNOSIS — I442 Atrioventricular block, complete: Secondary | ICD-10-CM

## 2016-11-15 DIAGNOSIS — I480 Paroxysmal atrial fibrillation: Secondary | ICD-10-CM | POA: Diagnosis not present

## 2016-11-15 NOTE — Patient Instructions (Addendum)
Medication Instructions: - Your physician recommends that you continue on your current medications as directed. Please refer to the Current Medication list given to you today.  Labwork: -   Procedures/Testing: - Your physician has requested that you have an echocardiogram. Echocardiography is a painless test that uses sound waves to create images of your heart. It provides your doctor with information about the size and shape of your heart and how well your heart's chambers and valves are working. This procedure takes approximately one hour. There are no restrictions for this procedure.  - Your physician has recommended that you have a Cardioversion (DCCV). Electrical Cardioversion uses a jolt of electricity to your heart either through paddles or wired patches attached to your chest. This is a controlled, usually prescheduled, procedure. Defibrillation is done under light anesthesia in the hospital, and you usually go home the day of the procedure. This is done to get your heart back into a normal rhythm. You are not awake for the procedure.    You are scheduled for a TEE/Cardioversion on Monday 11/21/16 with Dr. Rockey Situ.  Please arrive at the [facility] at 6:30 a.m. on the day of your procedure.  DIET INSTRUCTIONS:  Nothing to eat or drink after midnight except your medications with a              sip of water.         1) Labs: today 2/6  2) Medications: You may take all of your medications with enough water to get them down safely the morning of your procedure, except - hold torsemide the morning of your procedure  3) Must have a responsible person to drive you home.  4) Bring a current list of your medications and current insurance cards.    If you have any questions after you get home, please call the office at [phone]   Follow-Up: - Your physician recommends that you schedule a follow-up appointment in: 4 weeks (from 11/21/16) with Dr. Caryl Comes.  Any Additional Special  Instructions Will Be Listed Below (If Applicable).     If you need a refill on your cardiac medications before your next appointment, please call your pharmacy.

## 2016-11-15 NOTE — Progress Notes (Signed)
Patient Care Team: Leonel Ramsay, MD as PCP - General (Infectious Diseases) Alisa Graff, FNP as Nurse Practitioner (Cardiology) Minna Merritts, MD as Consulting Physician (Cardiology) Anabel Bene, MD as Referring Physician (Neurology)   HPI  Matthew Howe is a 71 y.o. male Seen following pacemaker implantation 2/16 by Dr. Greggory Brandy . He had presented to [facility] with chest pain and shortness of breath. He was found to be in complete heart block. He had an antecedent history of bifascicular block   Echocardiogram  Repeated 10/16 demonstrated normal LV function    Catheterization 2/14 had demonstrated mild nonobstructive disease.  He also has Parkinson's;  he continues to push through it. Still  working   Of late however, basically since Christmas, he has had increasing problems with shortness of breath and cough. He saw his PCP who thought he had a viral illness. He has not had peripheral edema.    Past Medical History:  Diagnosis Date  . AICD (automatic cardioverter/defibrillator) present   . CHF (congestive heart failure) ([place])   . Complete heart block ([place])    a. s/p St. Jude PPM 11/2014.  [name] Coronary artery disease   . GERD (gastroesophageal reflux disease)   . Heart murmur   . Hyperlipidemia   . Hypertension   . Parkinson's disease ([place])   . Presence of permanent cardiac pacemaker   . Shortness of breath dyspnea     Past Surgical History:  Procedure Laterality Date  . APPENDECTOMY  12/2010  . CARDIAC CATHETERIZATION  11/20/2012   Known CAD with one vessel coronary disease of left descending artery by cardiac cath.   . COLONOSCOPY    . INSERT / REPLACE / REMOVE PACEMAKER    . PACEMAKER INSERTION  11-21-14   STJ Assurity dual chamber pacemaker implanted by Dr Rayann Heman for CHB  . PERMANENT PACEMAKER INSERTION N/A 11/21/2014   Procedure: PERMANENT PACEMAKER INSERTION;  Surgeon: Thompson Grayer, MD;  Location[facility] C[facility] LAB;  Service: Cardiovascular;  Laterality: N/A;    . UPPER GI ENDOSCOPY      Current Outpatient Prescriptions  Medication Sig Dispense Refill  . apixaban (ELIQUIS) 5 MG TABS tablet Take 1 tablet (5 mg total) by mouth 2 (two) times daily. 60 tablet 6  . bisacodyl (BISACODYL) 5 MG EC tablet Take 5 mg by mouth every other day.    . carvedilol (COREG) 12.5 MG tablet Take 1 tablet (12.5 mg total) by mouth 2 (two) times daily. 180 tablet 3  . chlorpheniramine-HYDROcodone (TUSSIONEX PENNKINETIC ER) 10-8 MG/5ML SUER Take 5 mLs by mouth every 12 (twelve) hours as needed for cough.    . desloratadine (CLARINEX) 5 MG tablet Take 5 mg by mouth daily.    [name] dicyclomine (BENTYL) 20 MG tablet 20 mg 4 (four) times daily.     . fluticasone (FLONASE) 50 MCG/ACT nasal spray Place 2 sprays into the nose daily.    . lansoprazole (PREVACID) 15 MG capsule Take 15 mg by mouth as needed.    [name] lisinopril (PRINIVIL,ZESTRIL) 40 MG tablet Take 1 tablet (40 mg total) by mouth daily. 30 tablet 6  . polyethylene glycol (MIRALAX / GLYCOLAX) packet Take 17 g by mouth daily.     . potassium chloride SA (K-DUR,KLOR-CON) 10 MEQ tablet Take 2 tablets (20 mEq total) by mouth 2 (two) times daily as needed. Reported on 12/24/2015 120 tablet 6  . pramipexole (MIRAPEX) 0.75 MG tablet Take 0.75 mg by mouth daily.     [name]  selegiline (ELDEPRYL) 5 MG tablet Take 5 mg by mouth 2 (two) times daily with a meal.     . simvastatin (ZOCOR) 10 MG tablet Take 1 tablet (10 mg total) by mouth daily. 30 tablet 6  . torsemide (DEMADEX) 20 MG tablet Take 2 tablets (40 mg total) by mouth 2 (two) times daily as needed. 120 tablet 6   No current facility-administered medications for this visit.     Allergies  Allergen Reactions  . Linzess [Linaclotide] Shortness Of Breath  . Amitiza [Lubiprostone] Other (See Comments)    Abdominal cramps  . Codeine   . Norco [Hydrocodone-Acetaminophen] Diarrhea and Nausea And Vomiting  . Omeprazole Diarrhea and Nausea And Vomiting  . Requip  [Ropinirole] Nausea  Only    Review of Systems negative except from HPI and PMH  Physical Exam BP 130/68 (BP Location: Left Arm, Patient Position: Sitting, Cuff Size: Normal)   Pulse 70   Ht 6\' 1"  (1.854 m)   Wt 258 lb 8 oz (117.3 kg)   BMI 34.10 kg/m  Well developed and well nourished in no acute distress HENT normal E scleral and icterus clear Neck Supple JVP flat; carotids brisk and full Clear to ausculation Device pocket well healed; without hematoma or erythema.  There is no tethering Regular rate and rhythm, no murmurs gallops or rub Soft with active bowel sounds No clubbing cyanosis  Edema Alert and oriented, grossy normal motor and sensory function Skin Warm and Dry  ECG demonstrates atrial fibrillation with underlying ventricular pacing   Assessment and  Plan  Complete heart block  Pacemaker-St. Jude  Parkinson's disease  Hypertension  HFpEF  Atrial fibrillation-persistent    Blood pressure is reasonably controlled.  His HFpEF is almost certainly aggravated by the persistence of his atrial fibrillation which dates back to Christmas time. This correlates with his system course.  We'll undertake cardioversion. I discussed with him the likelihood of recurrence of atrial fibrillation, this provoked tears. We then discussed the fact that they are alternative strategies for maintaining sinus rhythm including antiarrhythmic drugs. This was reassuring.  On Anticoagulation;  No bleeding issues   We will also plan to undertake an echocardiogram given the fact that he is 100% ventricularly paced. The [place] suggesting 3% per year incidence of pacing induced cardiomyopathy  More than 50% of 45 min was spent in counseling related to the above   Device function is normal; was not reprogrammed

## 2016-11-16 LAB — BASIC METABOLIC PANEL
BUN / CREAT RATIO: 21 (ref 10–24)
BUN: 28 mg/dL — AB (ref 8–27)
CO2: 25 mmol/L (ref 18–29)
Calcium: 9.5 mg/dL (ref 8.6–10.2)
Chloride: 103 mmol/L (ref 96–106)
Creatinine, Ser: 1.36 mg/dL — ABNORMAL HIGH (ref 0.76–1.27)
GFR calc non Af Amer: 53 mL/min/{1.73_m2} — ABNORMAL LOW (ref >59)
GFR, EST AFRICAN AMERICAN: 61 mL/min/{1.73_m2} (ref >59)
Glucose: 106 mg/dL — ABNORMAL HIGH (ref 65–99)
Potassium: 4.9 mmol/L (ref 3.5–5.2)
Sodium: 144 mmol/L (ref 134–144)

## 2016-11-16 LAB — CBC WITH DIFFERENTIAL/PLATELET
BASOS ABS: 0 10*3/uL (ref 0.0–0.2)
Basos: 0 %
EOS (ABSOLUTE): 0.2 10*3/uL (ref 0.0–0.4)
Eos: 2 %
Hematocrit: 43.9 % (ref 37.5–51.0)
Hemoglobin: 14.7 g/dL (ref 13.0–17.7)
Immature Grans (Abs): 0.1 10*3/uL (ref 0.0–0.1)
Immature Granulocytes: 1 %
LYMPHS ABS: 1.1 10*3/uL (ref 0.7–3.1)
LYMPHS: 14 %
MCH: 29.5 pg (ref 26.6–33.0)
MCHC: 33.5 g/dL (ref 31.5–35.7)
MCV: 88 fL (ref 79–97)
Monocytes Absolute: 0.8 10*3/uL (ref 0.1–0.9)
Monocytes: 9 %
NEUTROS ABS: 5.8 10*3/uL (ref 1.4–7.0)
Neutrophils: 74 %
PLATELETS: 221 10*3/uL (ref 150–379)
RBC: 4.99 x10E6/uL (ref 4.14–5.80)
RDW: 14.8 % (ref 12.3–15.4)
WBC: 7.9 10*3/uL (ref 3.4–10.8)

## 2016-11-18 ENCOUNTER — Telehealth: Payer: Self-pay

## 2016-11-18 NOTE — Telephone Encounter (Signed)
Left message on pt's vm reminding him of TEE/DCCV on Monday, Feb 12. Reiterated preprocedure instructions and stressed the importance of taking his Eliquis the am of his procedure.

## 2016-11-20 ENCOUNTER — Other Ambulatory Visit: Payer: Self-pay | Admitting: Cardiovascular Disease

## 2016-11-20 DIAGNOSIS — I4819 Other persistent atrial fibrillation: Secondary | ICD-10-CM

## 2016-11-21 ENCOUNTER — Ambulatory Visit
Admission: RE | Admit: 2016-11-21 | Discharge: 2016-11-21 | Disposition: A | Discharge status: Home / Self Care | Payer: Medicare Other | Source: Ambulatory Visit | Attending: Internal Medicine | Admitting: Internal Medicine

## 2016-11-21 ENCOUNTER — Encounter
Admission: RE | Disposition: A | Discharge status: Home / Self Care | Payer: Self-pay | Source: Ambulatory Visit | Attending: Internal Medicine

## 2016-11-21 ENCOUNTER — Encounter: Payer: Self-pay | Admitting: *Deleted

## 2016-11-21 ENCOUNTER — Ambulatory Visit: Payer: Medicare Other | Admitting: Anesthesiology

## 2016-11-21 ENCOUNTER — Other Ambulatory Visit: Payer: Self-pay

## 2016-11-21 ENCOUNTER — Ambulatory Visit
Admission: RE | Admit: 2016-11-21 | Discharge: 2016-11-21 | Disposition: A | Discharge status: Home / Self Care | Payer: Medicare Other | Source: Ambulatory Visit | Attending: Cardiovascular Disease | Admitting: Cardiovascular Disease

## 2016-11-21 DIAGNOSIS — I2510 Atherosclerotic heart disease of native coronary artery without angina pectoris: Secondary | ICD-10-CM | POA: Diagnosis not present

## 2016-11-21 DIAGNOSIS — I4819 Other persistent atrial fibrillation: Secondary | ICD-10-CM

## 2016-11-21 DIAGNOSIS — I4891 Unspecified atrial fibrillation: Secondary | ICD-10-CM

## 2016-11-21 DIAGNOSIS — G20 Parkinson's disease: Secondary | ICD-10-CM | POA: Insufficient documentation

## 2016-11-21 DIAGNOSIS — J449 Chronic obstructive pulmonary disease, unspecified: Secondary | ICD-10-CM | POA: Diagnosis not present

## 2016-11-21 DIAGNOSIS — I509 Heart failure, unspecified: Secondary | ICD-10-CM | POA: Diagnosis not present

## 2016-11-21 DIAGNOSIS — K219 Gastro-esophageal reflux disease without esophagitis: Secondary | ICD-10-CM | POA: Insufficient documentation

## 2016-11-21 DIAGNOSIS — I481 Persistent atrial fibrillation: Secondary | ICD-10-CM | POA: Insufficient documentation

## 2016-11-21 DIAGNOSIS — I110 Hypertensive heart disease with heart failure: Secondary | ICD-10-CM | POA: Diagnosis not present

## 2016-11-21 HISTORY — PX: TEE WITHOUT CARDIOVERSION: SHX5443

## 2016-11-21 HISTORY — DX: Unspecified atrial fibrillation: I48.91

## 2016-11-21 HISTORY — PX: CARDIOVERSION: EP1203

## 2016-11-21 SURGERY — ECHOCARDIOGRAM, TRANSESOPHAGEAL
Anesthesia: General

## 2016-11-21 SURGERY — CARDIOVERSION (CATH LAB)
Anesthesia: General

## 2016-11-21 MED ORDER — PROPOFOL 10 MG/ML IV BOLUS
INTRAVENOUS | Status: DC | PRN
  Administered 2016-11-21: 30 mg via INTRAVENOUS
  Administered 2016-11-21: 20 mg via INTRAVENOUS
  Administered 2016-11-21: 30 mg via INTRAVENOUS
  Administered 2016-11-21: 20 mg via INTRAVENOUS
  Administered 2016-11-21 (×2): 30 mg via INTRAVENOUS
  Administered 2016-11-21 (×2): 20 mg via INTRAVENOUS

## 2016-11-21 MED ORDER — SODIUM CHLORIDE 0.9 % IV SOLN
INTRAVENOUS | Status: DC
  Administered 2016-11-21: 07:00:00 via INTRAVENOUS

## 2016-11-21 MED ORDER — SODIUM CHLORIDE FLUSH 0.9 % IV SOLN
INTRAVENOUS | Status: AC
  Filled 2016-11-21: qty 10

## 2016-11-21 MED ORDER — BUTAMBEN-TETRACAINE-BENZOCAINE 2-2-14 % EX AERO
INHALATION_SPRAY | CUTANEOUS | Status: AC
  Filled 2016-11-21: qty 20

## 2016-11-21 MED ORDER — MIDAZOLAM HCL 2 MG/2ML IJ SOLN
INTRAMUSCULAR | Status: AC
  Filled 2016-11-21: qty 2

## 2016-11-21 MED ORDER — PROPOFOL 10 MG/ML IV BOLUS
INTRAVENOUS | Status: AC
  Filled 2016-11-21: qty 40

## 2016-11-21 MED ORDER — MIDAZOLAM HCL 2 MG/2ML IJ SOLN
INTRAMUSCULAR | Status: DC | PRN
  Administered 2016-11-21: 1 mg via INTRAVENOUS

## 2016-11-21 MED ORDER — LIDOCAINE VISCOUS 2 % MT SOLN
OROMUCOSAL | Status: AC
  Filled 2016-11-21: qty 15

## 2016-11-21 NOTE — Anesthesia Post-op Follow-up Note (Cosign Needed)
Anesthesia QCDR form completed.        

## 2016-11-21 NOTE — Anesthesia Preprocedure Evaluation (Signed)
Anesthesia Evaluation  Patient identified by MRN, date of birth, ID band Patient awake    Reviewed: Allergy & Precautions, NPO status , Patient's Chart, lab work & pertinent test results  History of Anesthesia Complications Negative for: history of anesthetic complications  Airway Mallampati: II  TM Distance: >3 FB Neck ROM: Full    Dental no notable dental hx.    Pulmonary neg pulmonary ROS, neg sleep apnea, neg COPD,    breath sounds clear to auscultation- rhonchi (-) wheezing      Cardiovascular hypertension, + CAD (nonocclusive) and +CHF (preserved EF)  + dysrhythmias Atrial Fibrillation + pacemaker + Cardiac Defibrillator  Rhythm:Regular Rate:Normal - Systolic murmurs and - Diastolic murmurs Echo [id number]: - Left ventricle: The cavity size was normal. There was moderate   concentric hypertrophy. Systolic function was normal. The   estimated ejection fraction was in the range of 55% to 60%. Wall   motion was normal; there were no regional wall motion   abnormalities. Doppler parameters are consistent with abnormal   left ventricular relaxation (grade 1 diastolic dysfunction). - Aortic valve: There was mild regurgitation. - Left atrium: The atrium was mildly to moderately dilated. - Right ventricle: Pacer wire or catheter noted in right ventricle.   Systolic function was normal. - Pulmonary arteries: Systolic pressure could not be estimated. - Inferior vena cava: The vessel was dilated. The respirophasic   diameter changes were blunted (< 50%), consistent with elevated   central venous pressure.    Neuro/Psych Parkinson's disease  negative psych ROS   GI/Hepatic Neg liver ROS, GERD  ,  Endo/Other  negative endocrine ROSneg diabetes  Renal/GU negative Renal ROS     Musculoskeletal negative musculoskeletal ROS (+)   Abdominal (+) + obese,   Peds  Hematology negative hematology ROS (+)   Anesthesia Other  Findings   Reproductive/Obstetrics                             Anesthesia Physical Anesthesia Plan  ASA: III  Anesthesia Plan: General   Post-op Pain Management:    Induction: Intravenous  Airway Management Planned: Natural Airway  Additional Equipment:   Intra-op Plan:   Post-operative Plan:   Informed Consent: I have reviewed the patients History and Physical, chart, labs and discussed the procedure including the risks, benefits and alternatives for the proposed anesthesia with the patient or authorized representative who has indicated his/her understanding and acceptance.   Dental advisory given  Plan Discussed with: CRNA and Anesthesiologist  Anesthesia Plan Comments:         Anesthesia Quick Evaluation

## 2016-11-21 NOTE — Procedures (Signed)
Transesophageal Echocardiogram :  Indication: atrial fibrillation, persistent, percardioversion Requesting/ordering  physician: Dr. Caryl Comes  Procedure: Benzocaine spray x2 and 2 mls x 2 of viscous lidocaine were given orally to provide local anesthesia to the oropharynx. The patient was positioned supine on the left side, bite block provided. The patient was moderately sedated with the doses of versed and fentanyl as detailed below.  Using digital technique an omniplane probe was advanced into the distal esophagus without incident.   Moderate sedation: Sedation used:  Propofol given by anesthesia, 100 mg   See report in EPIC  for complete details: No thrombus in the LA appendage or the LA In brief, transgastric imaging revealed normal LV function with no RWMAs and no mural apical thrombus.  .  Estimated ejection fraction was 45%.  Right sided cardiac chambers were normal with no evidence of pulmonary hypertension.  Imaging of the septum showed no ASD or VSD Bubble study was negative for shunt 2D and color flow confirmed no PFO  The LA was well visualized in orthogonal views.  There was no spontaneous contrast and no thrombus in the LA and LA appendage   The descending thoracic aorta had no  mural aortic debris with no evidence of aneurysmal dilation or disection   Ida Rogue 11/21/2016 8:50 AM

## 2016-11-21 NOTE — CV Procedure (Signed)
Cardioversion procedure note For atrial fibrillation, persistent  Procedure Details:  Consent: Risks of procedure as well as the alternatives and risks of each were explained to the (patient/caregiver). Consent for procedure obtained.  Time Out: Verified patient identification, verified procedure, site/side was marked, verified correct patient position, special equipment/implants available, medications/allergies/relevent history reviewed, required imaging and test results available. Performed  Patient placed on cardiac monitor, pulse oximetry, supplemental oxygen as necessary.  Sedation given: propofol IV, Dr. Randa Lynn Pacer pads placed anterior and posterior chest.   Cardioverted 1 time(s).  Cardioverted at  150 J. Synchronized biphasic Converted to NSR a-v PACED   Evaluation: Findings: Post procedure EKG shows: NSR Complications: None Patient did tolerate procedure well.  Time Spent Directly with the Patient:  34 minutes   Esmond Plants, M.D., Ph.D.

## 2016-11-21 NOTE — Transfer of Care (Signed)
Immediate Anesthesia Transfer of Care Note  Patient: Matthew Howe  Procedure(s) Performed: Procedure(s): TRANSESOPHAGEAL ECHOCARDIOGRAM (TEE) (N/A) CARDIOVERSION (N/A)  Patient Location: PACU  Anesthesia Type:General  Level of Consciousness: sedated  Airway & Oxygen Therapy: Patient Spontanous Breathing and Patient connected to nasal cannula oxygen  Post-op Assessment: Report given to RN and Post -op Vital signs reviewed and stable  Post vital signs: Reviewed and stable  Last Vitals:  Vitals:   11/21/16 0639 11/21/16 0644  BP:  (!) 144/79  Pulse:  72  Resp:  18  Temp: 36.7 C     Last Pain: There were no vitals filed for this visit.       Complications: No apparent anesthesia complications

## 2016-11-21 NOTE — Anesthesia Postprocedure Evaluation (Signed)
Anesthesia Post Note  Patient: Matthew Howe  Procedure(s) Performed: Procedure(s) (LRB): TRANSESOPHAGEAL ECHOCARDIOGRAM (TEE) (N/A) CARDIOVERSION (N/A)  Patient location during evaluation: Other Anesthesia Type: General Level of consciousness: awake and alert and oriented Pain management: pain level controlled Vital Signs Assessment: post-procedure vital signs reviewed and stable Respiratory status: spontaneous breathing, nonlabored ventilation and respiratory function stable Cardiovascular status: blood pressure returned to baseline and stable Postop Assessment: no signs of nausea or vomiting Anesthetic complications: no     Last Vitals:  Vitals:   11/21/16 0639 11/21/16 0644  BP:  (!) 144/79  Pulse:  72  Resp:  18  Temp: 36.7 C     Last Pain: There were no vitals filed for this visit.               Paticia Moster

## 2016-11-21 NOTE — Anesthesia Procedure Notes (Signed)
Date/Time: 11/21/2016 7:17 AM Performed by: Nelda Marseille Pre-anesthesia Checklist: Patient identified, Emergency Drugs available, Suction available, Patient being monitored and Timeout performed Oxygen Delivery Method: Nasal cannula

## 2016-11-21 NOTE — Progress Notes (Signed)
*  PRELIMINARY RESULTS* Echocardiogram Echocardiogram Transesophageal has been performed.  Matthew Howe, Matthew Howe 11/21/2016, 8:18 AM

## 2016-11-23 ENCOUNTER — Other Ambulatory Visit: Payer: Self-pay

## 2016-11-23 MED ORDER — APIXABAN 5 MG PO TABS: 5.0000 mg | ORAL_TABLET | Freq: Two times a day (BID) | ORAL | 3 refills | Status: DC

## 2016-11-29 ENCOUNTER — Other Ambulatory Visit: Payer: Self-pay | Admitting: Cardiovascular Disease

## 2016-12-08 ENCOUNTER — Ambulatory Visit (INDEPENDENT_AMBULATORY_CARE_PROVIDER_SITE_OTHER): Payer: Medicare Other

## 2016-12-08 ENCOUNTER — Encounter: Payer: Self-pay | Admitting: Family

## 2016-12-08 ENCOUNTER — Ambulatory Visit (INDEPENDENT_AMBULATORY_CARE_PROVIDER_SITE_OTHER): Payer: Medicare Other | Admitting: *Deleted

## 2016-12-08 ENCOUNTER — Ambulatory Visit: Payer: Medicare Other | Attending: Family | Admitting: Family

## 2016-12-08 ENCOUNTER — Other Ambulatory Visit: Payer: Self-pay

## 2016-12-08 VITALS — BP 118/70 | HR 63 | Ht 73.0 in | Wt 255.0 lb

## 2016-12-08 VITALS — BP 122/45 | HR 60 | Resp 18 | Ht 73.0 in | Wt 257.1 lb

## 2016-12-08 DIAGNOSIS — Z800 Family history of malignant neoplasm of digestive organs: Secondary | ICD-10-CM | POA: Diagnosis not present

## 2016-12-08 DIAGNOSIS — I481 Persistent atrial fibrillation: Secondary | ICD-10-CM | POA: Diagnosis not present

## 2016-12-08 DIAGNOSIS — I442 Atrioventricular block, complete: Secondary | ICD-10-CM | POA: Diagnosis not present

## 2016-12-08 DIAGNOSIS — Z7901 Long term (current) use of anticoagulants: Secondary | ICD-10-CM | POA: Diagnosis not present

## 2016-12-08 DIAGNOSIS — I480 Paroxysmal atrial fibrillation: Secondary | ICD-10-CM

## 2016-12-08 DIAGNOSIS — Z885 Allergy status to narcotic agent status: Secondary | ICD-10-CM | POA: Diagnosis not present

## 2016-12-08 DIAGNOSIS — K219 Gastro-esophageal reflux disease without esophagitis: Secondary | ICD-10-CM | POA: Diagnosis not present

## 2016-12-08 DIAGNOSIS — I10 Essential (primary) hypertension: Secondary | ICD-10-CM

## 2016-12-08 DIAGNOSIS — I4819 Other persistent atrial fibrillation: Secondary | ICD-10-CM

## 2016-12-08 DIAGNOSIS — I4891 Unspecified atrial fibrillation: Secondary | ICD-10-CM

## 2016-12-08 DIAGNOSIS — E785 Hyperlipidemia, unspecified: Secondary | ICD-10-CM | POA: Diagnosis not present

## 2016-12-08 DIAGNOSIS — I110 Hypertensive heart disease with heart failure: Secondary | ICD-10-CM | POA: Insufficient documentation

## 2016-12-08 DIAGNOSIS — Z8249 Family history of ischemic heart disease and other diseases of the circulatory system: Secondary | ICD-10-CM | POA: Insufficient documentation

## 2016-12-08 DIAGNOSIS — G20 Parkinson's disease: Secondary | ICD-10-CM | POA: Insufficient documentation

## 2016-12-08 DIAGNOSIS — I5032 Chronic diastolic (congestive) heart failure: Secondary | ICD-10-CM | POA: Diagnosis present

## 2016-12-08 DIAGNOSIS — Z95810 Presence of automatic (implantable) cardiac defibrillator: Secondary | ICD-10-CM | POA: Insufficient documentation

## 2016-12-08 DIAGNOSIS — Z888 Allergy status to other drugs, medicaments and biological substances status: Secondary | ICD-10-CM | POA: Insufficient documentation

## 2016-12-08 DIAGNOSIS — Z823 Family history of stroke: Secondary | ICD-10-CM | POA: Insufficient documentation

## 2016-12-08 DIAGNOSIS — Z7951 Long term (current) use of inhaled steroids: Secondary | ICD-10-CM | POA: Insufficient documentation

## 2016-12-08 DIAGNOSIS — I2510 Atherosclerotic heart disease of native coronary artery without angina pectoris: Secondary | ICD-10-CM | POA: Diagnosis not present

## 2016-12-08 DIAGNOSIS — Z79899 Other long term (current) drug therapy: Secondary | ICD-10-CM | POA: Diagnosis not present

## 2016-12-08 NOTE — Patient Instructions (Signed)
Continue weighing daily and call for an overnight weight gain of > 2 pounds or a weekly weight gain of >5 pounds. 

## 2016-12-08 NOTE — Progress Notes (Signed)
Patient ID: Matthew Howe, male    DOB: Mar 07, 1946, 71 y.o.   MRN: [id number]  HPI  This is a 71 y/o male with a history of parkinson's, HTN, hyperlipidemia, GERD, CAD, complete heart block (pacemaker), atrial fibrillation, AICD and chronic heart failure.   Just had an echo done today 12/08/16 but report is not available. Previous echo was done 07/28/15 and showed an EF of 55-60% along with mild AR. Had a cardiac catheterization done 11/20/12 and showed mild nonobstructive disease.  Had a cardioversion done 11/21/16. Was in the ED 02/01/16 with HF exacerbation. He was treated and released.   He presents today for a follow-up visit with fatigue and shortness of breath that both he and his wife feel like are improving. Continues to have abdominal swelling and pedal edema but feel like both are improving. Constipation has recently improved as well. Had a colonoscopy done at [facility] 11/17/16 with removal of polyps. Continues to weigh himself daily and has had a gradual weight gain. Continues to work three days/week and does report feeling quite tired after those three days.   Past Medical History:  Diagnosis Date  . AICD (automatic cardioverter/defibrillator) present   . Atrial fibrillation ([place])   . CHF (congestive heart failure) ([place])   . Complete heart block ([place])    a. s/p St. Jude PPM 11/2014.  [name] Coronary artery disease   . GERD (gastroesophageal reflux disease)   . Heart murmur   . Hyperlipidemia   . Hypertension   . Parkinson's disease ([place])   . Presence of permanent cardiac pacemaker   . Shortness of breath dyspnea    Past Surgical History:  Procedure Laterality Date  . APPENDECTOMY  12/2010  . CARDIAC CATHETERIZATION  11/20/2012   Known CAD with one vessel coronary disease of left descending artery by cardiac cath.   [name] CARDIOVERSION N/A 11/21/2016   Procedure: CARDIOVERSION;  Surgeon: Deboraha Sprang, MD;  Location: [facility] ORS;  Service: Cardiovascular;  Laterality: N/A;  . COLONOSCOPY  2018   . INSERT / REPLACE / REMOVE PACEMAKER    . PACEMAKER INSERTION  11-21-14   STJ Assurity dual chamber pacemaker implanted by Dr Rayann Heman for CHB  . PERMANENT PACEMAKER INSERTION N/A 11/21/2014   Procedure: PERMANENT PACEMAKER INSERTION;  Surgeon: Thompson Grayer, MD;  Location: [facility] CATH LAB;  Service: Cardiovascular;  Laterality: N/A;  . TEE WITHOUT CARDIOVERSION N/A 11/21/2016   Procedure: TRANSESOPHAGEAL ECHOCARDIOGRAM (TEE);  Surgeon: Deboraha Sprang, MD;  Location: [facility] ORS;  Service: Cardiovascular;  Laterality: N/A;  . UPPER GI ENDOSCOPY     Family History  Problem Relation Age of Onset  . Heart disease Mother   . Heart disease Father   . Colon cancer Father   . Stroke Sister    Social History  Substance Use Topics  . Smoking status: Never Smoker  . Smokeless tobacco: Never Used  . Alcohol use No   Allergies  Allergen Reactions  . Linzess [Linaclotide] Shortness Of Breath  . Amitiza [Lubiprostone] Other (See Comments)    Abdominal cramps  . Codeine Diarrhea and Nausea And Vomiting  . Norco [Hydrocodone-Acetaminophen] Diarrhea and Nausea And Vomiting  . Omeprazole Diarrhea and Nausea And Vomiting  . Requip  [Ropinirole] Nausea Only   Prior to Admission medications   Medication Sig Start Date End Date Taking? Authorizing Provider  apixaban (ELIQUIS) 5 MG TABS tablet Take 1 tablet (5 mg total) by mouth 2 (two) times daily. 11/23/16  Yes Minna Merritts, MD  bisacodyl (BISACODYL) 5 MG EC tablet Take 5 mg by mouth every other day.   Yes Historical Provider, MD  carvedilol (COREG) 12.5 MG tablet Take 1 tablet (12.5 mg total) by mouth 2 (two) times daily. 05/17/16 12/08/16 Yes Thompson Grayer, MD  dicyclomine (BENTYL) 20 MG tablet Take 20 mg by mouth 3 (three) times daily with meals.  04/04/13  Yes Historical Provider, MD  fluticasone (FLONASE) 50 MCG/ACT nasal spray Place 2 sprays into the nose daily as needed for allergies.    Yes Historical Provider, MD  KLOR-CON 10 10 MEQ tablet TAKE 2  TABLETS (20 MEQ TOTAL) BY MOUTH 2 (TWO) TIMES DAILY AS NEEDED. REPORTED ON 12/24/2015 11/29/16  Yes Minna Merritts, MD  lansoprazole (PREVACID) 15 MG capsule Take 15 mg by mouth as needed (indigestion).    Yes Historical Provider, MD  lisinopril (PRINIVIL,ZESTRIL) 40 MG tablet Take 1 tablet (40 mg total) by mouth daily. 10/13/16  Yes Alisa Graff, FNP  loratadine (CLARITIN) 10 MG tablet Take 10 mg by mouth daily as needed for allergies.   Yes Historical Provider, MD  polyethylene glycol (MIRALAX / GLYCOLAX) packet Take 17 g by mouth every other day.    Yes Historical Provider, MD  pramipexole (MIRAPEX) 0.75 MG tablet Take 0.75 mg by mouth at bedtime.    Yes Historical Provider, MD  selegiline (ELDEPRYL) 5 MG tablet Take 5 mg by mouth 2 (two) times daily with a meal.    Yes Historical Provider, MD  simvastatin (ZOCOR) 10 MG tablet Take 1 tablet (10 mg total) by mouth daily. Patient taking differently: Take 10 mg by mouth every other day.  09/07/16  Yes Alisa Graff, FNP  torsemide (DEMADEX) 20 MG tablet TAKE 2 TABLETS (40 MG TOTAL) BY MOUTH 2 (TWO) TIMES DAILY AS NEEDED. 11/29/16  Yes Minna Merritts, MD     Review of Systems  Constitutional: Positive for fatigue. Negative for appetite change.  HENT: Positive for congestion and rhinorrhea. Negative for sore throat.   Eyes: Negative.   Respiratory: Positive for shortness of breath. Negative for chest tightness.   Cardiovascular: Positive for leg swelling. Negative for chest pain and palpitations.  Gastrointestinal: Positive for abdominal distention. Negative for abdominal pain.  Endocrine: Negative.   Genitourinary: Negative.   Musculoskeletal: Negative for back pain and neck pain.  Skin: Negative.   Allergic/Immunologic: Negative.   Neurological: Negative for dizziness and light-headedness.  Hematological: Negative for adenopathy. Does not bruise/bleed easily.  Psychiatric/Behavioral: Negative for dysphoric mood and sleep disturbance  (sleeping on 2 pillows). The patient is not nervous/anxious.    Vitals:   12/08/16 0852  BP: (!) 122/45  Pulse: 60  Resp: 18  SpO2: 97%  Weight: 257 lb 2 oz (116.6 kg)  Height: 6\' 1"  (1.854 m)   Wt Readings from Last 3 Encounters:  12/08/16 257 lb 2 oz (116.6 kg)  12/08/16 255 lb (115.7 kg)  11/21/16 252 lb (114.3 kg)   Lab Results  Component Value Date   CREATININE 1.36 (H) 11/15/2016   CREATININE 1.33 (H) 09/20/2016   CREATININE 1.23 03/23/2016   Physical Exam  Constitutional: He is oriented to person, place, and time. He appears well-developed and well-nourished.  HENT:  Head: Normocephalic and atraumatic.  Eyes: Conjunctivae are normal. Pupils are equal, round, and reactive to light.  Neck: Normal range of motion. Neck supple. No JVD present.  Cardiovascular: Normal rate and regular rhythm.   Pulmonary/Chest: Effort normal. He has no wheezes. He has no  rales.  Abdominal: Soft. He exhibits distension. There is no tenderness.  Musculoskeletal: He exhibits edema (trace edema with R>L). He exhibits no tenderness.  Neurological: He is alert and oriented to person, place, and time.  Skin: Skin is warm and dry.  Psychiatric: He has a normal mood and affect. His behavior is normal. Thought content normal.  Nursing note and vitals reviewed.  Assessment & Plan:  1: Chronic heart failure with preserved ejection fraction- - NYHA class II - mildly fluid overloaded today - weight up 7 pounds since last here September 2017. Has already had his diuretic increased to 60mg  daily and he feels like that is working better. Reminded to call for an overnight weight gain of >2 pounds or a weekly weight gain of >5 pounds - not adding salt to his food and wife is diligent about reading food labels - just had an echo done today  2: HTN- - BP looks good today - sees PCP Ola Spurr) 12/28/16  3: Atrial fibrillation- - cardioversion done 11/21/16 and so far, he has remained in NSR. Reports  feeling better since the cardioversion was done.   4: Parkinson's Disease- - continues to have tremors - sees neurologist Melrose Nakayama) 12/28/16  Patient did not bring his medications nor a list. Each medication was verbally reviewed with the patient and he was encouraged to bring the bottles to every visit to confirm accuracy of list.  Return here in 6 months or sooner for any questions/problems before then.

## 2016-12-08 NOTE — Patient Instructions (Signed)
1.) Reason for visit: EKG post TEE/DCCV on 11/21/16.  2.) Name of MD requesting visit: Dr Rockey Situ  3.) H&P:  Hx atrial fib, CHF.  4.) ROS related to problem: Patient here today for EKG check. States he has some SOB with exertion. Denies CP, palpitations or dizziness. Patient denies swelling.  5.) Assessment and plan per MD: Dr Rockey Situ reviewed EKG. He said he is in sinus rhythm. Advised to encourage patient to take his Torsemide and potassium as directed to help with SOB and encourage weight loss. Dr Rockey Situ asked to have Dr Caryl Comes review EKG as well as he is also involved in care.  Dr Caryl Comes in office as well. He reviewed EKG. Stated showed Sinus rhythm, paced. No changes with care.  Patient advised of Dr Rockey Situ and Dr Olin Pia advice. He and his wife verbalized understanding. They are leaving here for appointment with [facility] today as well.

## 2016-12-20 ENCOUNTER — Encounter: Payer: Medicare Other | Admitting: Internal Medicine

## 2016-12-27 LAB — CUP PACEART INCLINIC DEVICE CHECK
Date Time Interrogation Session: 20180206145024
Implantable Lead Implant Date: 20160212
Implantable Lead Implant Date: 20160212
Implantable Lead Location: 753859
Implantable Lead Model: 1948
Implantable Pulse Generator Implant Date: 20160212
Lead Channel Impedance Value: 487.5 Ohm
Lead Channel Impedance Value: 625 Ohm
Lead Channel Pacing Threshold Amplitude: 1 V
Lead Channel Pacing Threshold Pulse Width: 0.4 ms
Lead Channel Pacing Threshold Pulse Width: 0.4 ms
Lead Channel Sensing Intrinsic Amplitude: 5 mV
Lead Channel Setting Pacing Amplitude: 1.625
Lead Channel Setting Sensing Sensitivity: 4 mV
MDC IDC LEAD LOCATION: 753860
MDC IDC MSMT BATTERY VOLTAGE: 2.99 V
MDC IDC MSMT LEADCHNL RA PACING THRESHOLD AMPLITUDE: 0.625 V
MDC IDC PG SERIAL: 3049931
MDC IDC SET LEADCHNL RV PACING AMPLITUDE: 1.25 V
MDC IDC SET LEADCHNL RV PACING PULSEWIDTH: 0.4 ms
MDC IDC STAT BRADY RA PERCENT PACED: 30 %
MDC IDC STAT BRADY RV PERCENT PACED: 99.83 %
Pulse Gen Model: 2240

## 2017-01-09 ENCOUNTER — Telehealth: Payer: Self-pay | Admitting: Cardiovascular Disease

## 2017-01-09 NOTE — Telephone Encounter (Signed)
Spoke w/ pt's wife.  She reports that pt c/o leg cramps so bad that he could not walk while taking simvastatin 10 mg. She reports that she decreased dose to every other day and his sx improved, but did not resolve. She stopped his simvastatin altogether 2 weeks ago and sx have resolved completely. She is worried that he has not been on any chol med since that time and would like Dr. Donivan Scull recommendation for another med. Advised her that I will make Dr. Rockey Situ aware and call her back w/ his recommendation.

## 2017-01-09 NOTE — Telephone Encounter (Signed)
Has he tried any other statins? We could try crestor 5 mg QOQ Would write script for daily

## 2017-01-09 NOTE — Telephone Encounter (Signed)
Pt wife states pt medication, Simvastatin is making him have trouble walking.She is not sure if Dr. Rockey Situ needs to switch him to something else. He hasnt taken a cholesterol medication in 2 weeks. She states he has done "much better" since he has stopped this medication. Please call.

## 2017-01-10 MED ORDER — ROSUVASTATIN CALCIUM 5 MG PO TABS: 5.0000 mg | ORAL_TABLET | Freq: Every day | ORAL | 6 refills | Status: DC

## 2017-01-10 NOTE — Telephone Encounter (Signed)
Spoke w/ pt's wife. Advised her of Dr. Donivan Scull recommendation. She is agreeable and will call back if pt is unable to tolerate.

## 2017-02-07 ENCOUNTER — Encounter: Payer: Self-pay | Admitting: Internal Medicine

## 2017-02-07 ENCOUNTER — Ambulatory Visit (INDEPENDENT_AMBULATORY_CARE_PROVIDER_SITE_OTHER): Payer: Medicare Other | Admitting: Internal Medicine

## 2017-02-07 VITALS — BP 142/76 | HR 60 | Ht 73.0 in | Wt 262.0 lb

## 2017-02-07 DIAGNOSIS — I442 Atrioventricular block, complete: Secondary | ICD-10-CM

## 2017-02-07 DIAGNOSIS — I481 Persistent atrial fibrillation: Secondary | ICD-10-CM

## 2017-02-07 DIAGNOSIS — I5032 Chronic diastolic (congestive) heart failure: Secondary | ICD-10-CM

## 2017-02-07 DIAGNOSIS — Z950 Presence of cardiac pacemaker: Secondary | ICD-10-CM | POA: Diagnosis not present

## 2017-02-07 DIAGNOSIS — I110 Hypertensive heart disease with heart failure: Secondary | ICD-10-CM | POA: Diagnosis not present

## 2017-02-07 DIAGNOSIS — I4819 Other persistent atrial fibrillation: Secondary | ICD-10-CM

## 2017-02-07 DIAGNOSIS — I4589 Other specified conduction disorders: Secondary | ICD-10-CM | POA: Diagnosis not present

## 2017-02-07 LAB — CUP PACEART INCLINIC DEVICE CHECK
Battery Voltage: 2.99 V
Brady Statistic RV Percent Paced: 99 %
Date Time Interrogation Session: 20180501145747
Implantable Lead Implant Date: 20160212
Implantable Lead Implant Date: 20160212
Implantable Lead Location: 753860
Lead Channel Pacing Threshold Pulse Width: 0.4 ms
Lead Channel Sensing Intrinsic Amplitude: 2.1 mV
Lead Channel Setting Pacing Amplitude: 1.625
Lead Channel Setting Pacing Pulse Width: 0.4 ms
MDC IDC LEAD LOCATION: 753859
MDC IDC MSMT LEADCHNL RA PACING THRESHOLD AMPLITUDE: 0.625 V
MDC IDC MSMT LEADCHNL RV PACING THRESHOLD AMPLITUDE: 1.25 V
MDC IDC MSMT LEADCHNL RV PACING THRESHOLD PULSEWIDTH: 0.4 ms
MDC IDC PG IMPLANT DT: 20160212
MDC IDC SET LEADCHNL RA PACING AMPLITUDE: 1.625
MDC IDC SET LEADCHNL RV SENSING SENSITIVITY: 4 mV
MDC IDC STAT BRADY RA PERCENT PACED: 42 %
Pulse Gen Model: 2240
Pulse Gen Serial Number: 3049931

## 2017-02-07 NOTE — Progress Notes (Signed)
Patient Care Team: Leonel Ramsay, MD as PCP - General (Infectious Diseases) Alisa Graff, FNP as Nurse Practitioner (Cardiology) Minna Merritts, MD as Consulting Physician (Cardiology) Anabel Bene, MD as Referring Physician (Neurology)   HPI  Matthew Howe is a 71 y.o. male Seen following pacemaker implantation 2/16 by Dr. Greggory Brandy . He had presented to [facility] with chest pain and shortness of breath. He was found to be in complete heart block. He had an antecedent history of bifascicular block   seen 2/18. At that time shortness of breath worsening SOB seemed to correlate  with persistence of atrial fibrillation. Cardioversion was accomplished.  He noted no significant difference.  He has had intermittent atrial fibrillation; again he has noted no significant difference.   Catheterization 2/14 had demonstrated mild nonobstructive disease. DATE TEST    10/16    echo   EF 55-60 %   3/18    Echo   EF 55-60 %          He also has Parkinson's;  he continues to push through it. Still  Working      Past Medical History:  Diagnosis Date  . AICD (automatic cardioverter/defibrillator) present   . Atrial fibrillation ([place])   . CHF (congestive heart failure) ([place])   . Complete heart block ([place])    a. s/p St. Jude PPM 11/2014.  [name] Coronary artery disease   . GERD (gastroesophageal reflux disease)   . Heart murmur   . Hyperlipidemia   . Hypertension   . Parkinson's disease ([place])   . Presence of permanent cardiac pacemaker   . Shortness of breath dyspnea     Past Surgical History:  Procedure Laterality Date  . APPENDECTOMY  12/2010  . CARDIAC CATHETERIZATION  11/20/2012   Known CAD with one vessel coronary disease of left descending artery by cardiac cath.   [name] CARDIOVERSION N/A 11/21/2016   Procedure: CARDIOVERSION;  Surgeon: Deboraha Sprang, MD;  Location: [facility] ORS;  Service: Cardiovascular;  Laterality: N/A;  . COLONOSCOPY  2018  . INSERT / REPLACE / REMOVE PACEMAKER      . PACEMAKER INSERTION  11-21-14   STJ Assurity dual chamber pacemaker implanted by Dr Rayann Heman for CHB  . PERMANENT PACEMAKER INSERTION N/A 11/21/2014   Procedure: PERMANENT PACEMAKER INSERTION;  Surgeon: Thompson Grayer, MD;  Location: [facility] CATH LAB;  Service: Cardiovascular;  Laterality: N/A;  . TEE WITHOUT CARDIOVERSION N/A 11/21/2016   Procedure: TRANSESOPHAGEAL ECHOCARDIOGRAM (TEE);  Surgeon: Deboraha Sprang, MD;  Location: [facility] ORS;  Service: Cardiovascular;  Laterality: N/A;  . UPPER GI ENDOSCOPY      Current Outpatient Prescriptions  Medication Sig Dispense Refill  . apixaban (ELIQUIS) 5 MG TABS tablet Take 1 tablet (5 mg total) by mouth 2 (two) times daily. 60 tablet 3  . bisacodyl (BISACODYL) 5 MG EC tablet Take 5 mg by mouth every other day.    . carvedilol (COREG) 12.5 MG tablet Take 1 tablet (12.5 mg total) by mouth 2 (two) times daily. 180 tablet 3  . dicyclomine (BENTYL) 20 MG tablet Take 20 mg by mouth 3 (three) times daily with meals.     . fluticasone (FLONASE) 50 MCG/ACT nasal spray Place 2 sprays into the nose daily as needed for allergies.     [name] KLOR-CON 10 10 MEQ tablet TAKE 2 TABLETS (20 MEQ TOTAL) BY MOUTH 2 (TWO) TIMES DAILY AS NEEDED. REPORTED ON 12/24/2015 120 tablet 6  . lansoprazole (PREVACID)  15 MG capsule Take 15 mg by mouth as needed (indigestion).     [name] lisinopril (PRINIVIL,ZESTRIL) 40 MG tablet Take 1 tablet (40 mg total) by mouth daily. 30 tablet 6  . loratadine (CLARITIN) 10 MG tablet Take 10 mg by mouth daily as needed for allergies.    . polyethylene glycol (MIRALAX / GLYCOLAX) packet Take 17 g by mouth every other day.     . pramipexole (MIRAPEX) 0.75 MG tablet Take 0.75 mg by mouth at bedtime.     . rosuvastatin (CRESTOR) 5 MG tablet Take 1 tablet (5 mg total) by mouth daily. 30 tablet 6  . selegiline (ELDEPRYL) 5 MG tablet Take 5 mg by mouth 2 (two) times daily with a meal.     . torsemide (DEMADEX) 20 MG tablet Take 60 mg by mouth daily.     No current  facility-administered medications for this visit.     Allergies  Allergen Reactions  . Linzess [Linaclotide] Shortness Of Breath  . Amitiza [Lubiprostone] Other (See Comments)    Abdominal cramps  . Codeine Diarrhea and Nausea And Vomiting  . Norco [Hydrocodone-Acetaminophen] Diarrhea and Nausea And Vomiting  . Omeprazole Diarrhea and Nausea And Vomiting  . Requip  [Ropinirole] Nausea Only    Review of Systems negative except from HPI and PMH  Physical Exam BP (!) 142/76 (BP Location: Left Arm, Patient Position: Sitting, Cuff Size: Normal)   Pulse 60   Ht 6\' 1"  (1.854 m)   Wt 262 lb (118.8 kg)   BMI 34.57 kg/m  Well developed and well nourished in no acute distress HENT normal E scleral and icterus clear Neck Supple JVP flat; carotids brisk and full Clear to ausculation Device pocket well healed; without hematoma or erythema.  There is no tethering  Regular rate and rhythm, no murmurs gallops or rub Soft with active bowel sounds No clubbing cyanosis  Edema Alert and oriented, grossy normal motor and sensory function  Tremor at rest  Skin Warm and Dry  ECG demonstrates atrial fibrillation with underlying ventricular pacing   Assessment and  Plan  Complete heart block  Pacemaker-St. Jude  Parkinson's disease  Hypertension  Hypertensive heart disease   HFpEF  Atrial fibrillation-persistent    Blood pressure is reasonably controlled.  At home systolic blood pressures are in the 120 range.  Euvolemic continue current meds  It was appreciated that his mode switch rate was DDI 70 and that he had no associated chronotropic competence. We have reprogrammed his to [place]. He will also use a pulse oximeter as his resting rate in sinus is 60 and his resting rate in atrial fibrillation is 70. This may allow [place] to associated symptoms with the presence or absence of atrial fibrillation.  On Anticoagulation;  No bleeding issues      More than 50% of 45 min was spent in  counseling related to the above   Device function is normal; was not reprogrammed

## 2017-02-07 NOTE — Patient Instructions (Signed)
Medication Instructions: - Your physician recommends that you continue on your current medications as directed. Please refer to the Current Medication list given to you today.  Labwork: - none ordered  Procedures/Testing: - none ordered  Follow-Up: - Remote monitoring is used to monitor your Pacemaker of ICD from home. This monitoring reduces the number of office visits required to check your device to one time per year. It allows [place] to keep an eye on the functioning of your device to ensure it is working properly. You are scheduled for a device check from home on 05/09/17. You may send your transmission at any time that day. If you have a wireless device, the transmission will be sent automatically. After your physician reviews your transmission, you will receive a postcard with your next transmission date.  - Your physician wants you to follow-up in: 6 months with Dr. Caryl Comes. You will receive a reminder letter in the mail two months in advance. If you don't receive a letter, please call our office to schedule the follow-up appointment.   Any Additional Special Instructions Will Be Listed Below (If Applicable).     If you need a refill on your cardiac medications before your next appointment, please call your pharmacy.

## 2017-02-20 ENCOUNTER — Telehealth: Payer: Self-pay | Admitting: Cardiovascular Disease

## 2017-02-20 NOTE — Telephone Encounter (Signed)
Pt previously took simvastatin which caused myalgias. He now reports the same sx w/ rosuvastatin. Pt's wife is calling to find out what Dr. Rockey Situ recommends.

## 2017-02-20 NOTE — Telephone Encounter (Signed)
One option would be to change the Crestor to a medication that is not a statin This would be Zetia 10 mg daily As it is not a statin should not cause muscle ache Medication stays in his GI tract, blocks absorption of cholesterol from food he eats Good for 10% drop

## 2017-02-20 NOTE — Telephone Encounter (Signed)
Pt wife states Rosuvastatin is still causing pt severe leg cramps . Please call.

## 2017-02-21 MED ORDER — EZETIMIBE 10 MG PO TABS: 10.0000 mg | ORAL_TABLET | Freq: Every day | ORAL | 6 refills | Status: DC

## 2017-02-21 NOTE — Telephone Encounter (Signed)
Left message for pt to call back  °

## 2017-02-21 NOTE — Telephone Encounter (Signed)
Spoke w/ pt's wife.  Advised her of Dr. Donivan Scull recommendation.  She is agreeable and will call back w/ if sx do not improve.

## 2017-03-18 NOTE — Progress Notes (Signed)
Cardiology Office Note  Date:  03/21/2017   ID:  Matthew Howe, Matthew Howe 1946-06-26, MRN [id number]  PCP:  Leonel Ramsay, MD   Chief Complaint  Patient presents with  . OTHER    6 month f/u no complaints today. Meds reviewed verbally with pt.    HPI:  Matthew Howe is a very pleasant 71 year old gentleman with diagnosis of Parkinson's who currently works delivering car parts,  Hypertension, cardiac catheterization showing mild CAD , February 2014  EF 55% hyperlipidemia,  s/p pacemaker placement.complete heart block.  Found to have atrial fibrillation 11/22/2014 on pacemaker download, started on anticoagulation   hospitalization for paroxysmal atrial fibrillation/acute on chronic diastolic CHF He presents today for weight gain, shortness of breath, leg edema, chronic diastolic CHF  8 mode switches (15%)--AF +Eliquis, longest ~9 days.   dietary indiscretion. Eating lots of desserts, less exercise Takes torsemide 20 mg TID with potassium TID  Denies any chest pain on exertion Reports having some shortness of breath Wife who presents with him today thinks he is doing better recently after knee pacemaker changes  Review of recent pacer download shows increase in atrial fibrillation burden from 2% in November 2017 up to 16% recently. Longest episode 9 days recently  EKG personally reviewed by myself on todays visit shows paced rhythm 60 bpm  Other past medical history  presented to the hospital 07/28/2015 with abdominal bloating, shortness of breath Was treated with diureticswith improvement of his symptoms Encouraged to take Lasix daily  leads a busy lifestyle, continues to work most days of the week. Fatigue, tremor Followed by neurology, Dr. Melrose Nakayama for Parkinson's Previous issues with constipation  He [facility] with bradycardia February 2016. This seemed to improve without intervention and he is asymptomatic. He had recurrent symptoms and we referred him to  [name] for further evaluation. He was seen in the emergency room and found to be in heart block, pacemaker placed urgently   Previous Stress echo leading to cardiac catheterization at [facility].  Stress echo was a limited study, ejection fraction estimated at 45% with EF of 50% at peak stress.  Cardiac catheterization 11/20/2012 with 25% proximal LAD disease, followed by 40% lesion. The cardiac catheterization note makes mention of atrial flutter though arrhythmia is not mentioned in any of the clinic notes.  Previous Echocardiogram shows ejection fraction 50-55%, mild to moderate aortic valve insufficiency, otherwise normal study.  Abdominal CT 12/22/2010 showed acute appendicitis, colonic diverticulosis    PMH:   has a past medical history of AICD (automatic cardioverter/defibrillator) present; Atrial fibrillation ([place]); CHF (congestive heart failure) ([place]); Complete heart block ([place]); Coronary artery disease; GERD (gastroesophageal reflux disease); Heart murmur; Hyperlipidemia; Hypertension; Parkinson's disease ([place]); Presence of permanent cardiac pacemaker; and Shortness of breath dyspnea.  PSH:    Past Surgical History:  Procedure Laterality Date  . APPENDECTOMY  12/2010  . CARDIAC CATHETERIZATION  11/20/2012   Known CAD with one vessel coronary disease of left descending artery by cardiac cath.   [name] CARDIOVERSION N/A 11/21/2016   Procedure: CARDIOVERSION;  Surgeon: Deboraha Sprang, MD;  Location: [facility] ORS;  Service: Cardiovascular;  Laterality: N/A;  . COLONOSCOPY  2018  . INSERT / REPLACE / REMOVE PACEMAKER    . PACEMAKER INSERTION  11-21-14   STJ Assurity dual chamber pacemaker implanted by Dr Rayann Heman for CHB  . PERMANENT PACEMAKER INSERTION N/A 11/21/2014   Procedure: PERMANENT PACEMAKER INSERTION;  Surgeon: Thompson Grayer, MD;  Location: [facility] CATH LAB;  Service: Cardiovascular;  Laterality:  N/A;  . TEE WITHOUT CARDIOVERSION N/A 11/21/2016   Procedure: TRANSESOPHAGEAL  ECHOCARDIOGRAM (TEE);  Surgeon: Deboraha Sprang, MD;  Location: [facility] ORS;  Service: Cardiovascular;  Laterality: N/A;  . UPPER GI ENDOSCOPY      Current Outpatient Prescriptions  Medication Sig Dispense Refill  . apixaban (ELIQUIS) 5 MG TABS tablet Take 1 tablet (5 mg total) by mouth 2 (two) times daily. 60 tablet 5  . bisacodyl (BISACODYL) 5 MG EC tablet Take 5 mg by mouth every other day.    . carvedilol (COREG) 12.5 MG tablet Take 1 tablet (12.5 mg total) by mouth 2 (two) times daily. 180 tablet 3  . dicyclomine (BENTYL) 20 MG tablet Take 20 mg by mouth 3 (three) times daily with meals.     [name] ezetimibe (ZETIA) 10 MG tablet Take 1 tablet (10 mg total) by mouth daily. 30 tablet 6  . fluticasone (FLONASE) 50 MCG/ACT nasal spray Place 2 sprays into the nose daily as needed for allergies.     [name] KLOR-CON 10 10 MEQ tablet TAKE 2 TABLETS (20 MEQ TOTAL) BY MOUTH 2 (TWO) TIMES DAILY AS NEEDED. REPORTED ON 12/24/2015 120 tablet 6  . lansoprazole (PREVACID) 15 MG capsule Take 15 mg by mouth as needed (indigestion).     [name] lisinopril (PRINIVIL,ZESTRIL) 40 MG tablet Take 1 tablet (40 mg total) by mouth daily. 30 tablet 6  . loratadine (CLARITIN) 10 MG tablet Take 10 mg by mouth daily as needed for allergies.    . meloxicam (MOBIC) 15 MG tablet Take 15 mg by mouth daily.    . polyethylene glycol (MIRALAX / GLYCOLAX) packet Take 17 g by mouth every other day.     . pramipexole (MIRAPEX) 0.5 MG tablet Take 0.5 mg by mouth 2 (two) times daily.    [name] torsemide (DEMADEX) 20 MG tablet Take 60 mg by mouth daily.     No current facility-administered medications for this visit.      Allergies:   Linzess [linaclotide]; Amitiza [lubiprostone]; Codeine; Norco [hydrocodone-acetaminophen]; Omeprazole; and Requip  [ropinirole]   Social History:  The patient  reports that he has never smoked. He has never used smokeless tobacco. He reports that he does not drink alcohol or use drugs.   Family History:   family history  includes Colon cancer in his father; Heart disease in his father and mother; Stroke in his sister.    Review of Systems: Review of Systems  Constitutional: Positive for malaise/fatigue.       Weight gain  Respiratory: Positive for shortness of breath.   Cardiovascular: Negative.   Gastrointestinal: Negative.   Musculoskeletal: Negative.   Neurological: Negative.   Psychiatric/Behavioral: Negative.   All other systems reviewed and are negative.    PHYSICAL EXAM: VS:  BP 124/64 (BP Location: Left Arm, Patient Position: Sitting, Cuff Size: Large)   Pulse 60   Ht 6\' 1"  (1.854 m)   Wt 262 lb 8 oz (119.1 kg)   BMI 34.63 kg/m  , BMI Body mass index is 34.63 kg/m. GEN: Well nourished, well developed, in no acute distress , Obese  HEENT: normal  Neck: no JVD, carotid bruits, or masses Cardiac: RRR; no murmurs, rubs, or gallops,no edema  Respiratory:  clear to auscultation bilaterally, normal work of breathing GI: soft, nontender, nondistended, + BS MS: no deformity or atrophy  Skin: warm and dry, no rash Neuro:  Strength and sensation are intact Psych: euthymic mood, full affect    Recent Labs: 03/23/2016: ALT 19  11/15/2016: BUN 28; Creatinine, Ser 1.36; Hemoglobin 14.7; Platelets 221; Potassium 4.9; Sodium 144    Lipid Panel Lab Results  Component Value Date   CHOL 188 03/23/2016   HDL 46 03/23/2016   LDLCALC 114 (H) 03/23/2016   TRIG 138 03/23/2016      Wt Readings from Last 3 Encounters:  03/21/17 262 lb 8 oz (119.1 kg)  02/07/17 262 lb (118.8 kg)  12/08/16 257 lb 2 oz (116.6 kg)       ASSESSMENT AND PLAN:  Essential hypertension - Plan: EKG 62-BWLS, Basic Metabolic Panel (BMET) Blood pressure is well controlled on today's visit. No changes made to the medications.  Paroxysmal atrial fibrillation (HCC) - Plan: EKG 93-TDSK, Basic Metabolic Panel (BMET) Discussed with Dr. Caryl Comes   we will review the chart to determine if antiarrhythmic medication needed given  increase in atrial fibrillation burden up to 16%. Recent episode 9 days He is only on carvedilol Minimal coronary disease on catheterization 2014  need to determine which antiarrhythmic is best with his Parkinson's  Coronary artery disease involving native coronary artery of native heart without angina pectoris Currently with no symptoms of angina. No further workup at this time. Continue current medication regimen.  Complete heart block ([place]) Pacemaker in place, followed by Dr. Caryl Comes Recent programming changes made   Mixed hyperlipidemia Encouraged him to stay on his low-dose cholesterol medication  Chronic diastolic heart failure ([place]) BMP today, reports taking torsemide with potassium 3 times a day No leg swelling on exam, lungs clear, appears euvolemic  suggested he cut back on torsemide as needed for dehydration over the summer  Parkinson's disease ([place]) Trouble in the morning, very slow to get going Suggested he talk with Dr. Melrose Nakayama  Morbid obesity [facility]) We have encouraged continued exercise, careful diet management in an effort to lose weight. Dietary instructions provided  Shortness of breath Likely secondary to weight gain, deconditioning   Total encounter time more than 25 minutes  Greater than 50% was spent in counseling and coordination of care with the patient   Disposition:   F/U  12 months   No orders of the defined types were placed in this encounter.    Signed, Esmond Plants, M.D., Ph.D. 03/21/2017  [place], [place]

## 2017-03-19 ENCOUNTER — Other Ambulatory Visit: Payer: Self-pay | Admitting: Cardiovascular Disease

## 2017-03-20 ENCOUNTER — Other Ambulatory Visit: Payer: Self-pay | Admitting: Family

## 2017-03-20 MED ORDER — APIXABAN 5 MG PO TABS: 5.0000 mg | ORAL_TABLET | Freq: Two times a day (BID) | ORAL | 5 refills | Status: DC

## 2017-03-21 ENCOUNTER — Ambulatory Visit (INDEPENDENT_AMBULATORY_CARE_PROVIDER_SITE_OTHER): Payer: Medicare Other | Admitting: Cardiovascular Disease

## 2017-03-21 ENCOUNTER — Encounter: Payer: Self-pay | Admitting: Cardiovascular Disease

## 2017-03-21 VITALS — BP 124/64 | HR 60 | Ht 73.0 in | Wt 262.5 lb

## 2017-03-21 DIAGNOSIS — I442 Atrioventricular block, complete: Secondary | ICD-10-CM

## 2017-03-21 DIAGNOSIS — I2510 Atherosclerotic heart disease of native coronary artery without angina pectoris: Secondary | ICD-10-CM

## 2017-03-21 DIAGNOSIS — I480 Paroxysmal atrial fibrillation: Secondary | ICD-10-CM

## 2017-03-21 DIAGNOSIS — I5032 Chronic diastolic (congestive) heart failure: Secondary | ICD-10-CM

## 2017-03-21 DIAGNOSIS — G20 Parkinson's disease: Secondary | ICD-10-CM

## 2017-03-21 DIAGNOSIS — R0602 Shortness of breath: Secondary | ICD-10-CM

## 2017-03-21 NOTE — Patient Instructions (Addendum)

## 2017-05-09 ENCOUNTER — Telehealth: Payer: Self-pay | Admitting: Cardiology

## 2017-05-09 ENCOUNTER — Ambulatory Visit (INDEPENDENT_AMBULATORY_CARE_PROVIDER_SITE_OTHER): Payer: Medicare Other | Admitting: *Deleted

## 2017-05-09 DIAGNOSIS — I442 Atrioventricular block, complete: Secondary | ICD-10-CM | POA: Diagnosis not present

## 2017-05-09 NOTE — Telephone Encounter (Signed)
LMOVM reminding pt to send remote transmission.   

## 2017-05-10 NOTE — Progress Notes (Signed)
Remote pacemaker transmission.   

## 2017-05-12 ENCOUNTER — Encounter: Payer: Self-pay | Admitting: Cardiology

## 2017-05-15 ENCOUNTER — Other Ambulatory Visit: Payer: Self-pay

## 2017-05-15 MED ORDER — LISINOPRIL 40 MG PO TABS: 40.0000 mg | ORAL_TABLET | Freq: Every day | ORAL | 6 refills | Status: DC

## 2017-05-18 ENCOUNTER — Other Ambulatory Visit: Payer: Self-pay | Admitting: Internal Medicine

## 2017-06-14 ENCOUNTER — Ambulatory Visit: Payer: Medicare Other | Attending: Family | Admitting: Family

## 2017-06-14 ENCOUNTER — Encounter: Payer: Self-pay | Admitting: Family

## 2017-06-14 VITALS — BP 153/65 | HR 62 | Resp 20 | Ht 73.0 in | Wt 260.4 lb

## 2017-06-14 DIAGNOSIS — Z885 Allergy status to narcotic agent status: Secondary | ICD-10-CM | POA: Insufficient documentation

## 2017-06-14 DIAGNOSIS — I5032 Chronic diastolic (congestive) heart failure: Secondary | ICD-10-CM

## 2017-06-14 DIAGNOSIS — I480 Paroxysmal atrial fibrillation: Secondary | ICD-10-CM

## 2017-06-14 DIAGNOSIS — Z9889 Other specified postprocedural states: Secondary | ICD-10-CM | POA: Insufficient documentation

## 2017-06-14 DIAGNOSIS — K219 Gastro-esophageal reflux disease without esophagitis: Secondary | ICD-10-CM | POA: Insufficient documentation

## 2017-06-14 DIAGNOSIS — Z800 Family history of malignant neoplasm of digestive organs: Secondary | ICD-10-CM | POA: Insufficient documentation

## 2017-06-14 DIAGNOSIS — Z823 Family history of stroke: Secondary | ICD-10-CM | POA: Diagnosis not present

## 2017-06-14 DIAGNOSIS — I442 Atrioventricular block, complete: Secondary | ICD-10-CM | POA: Insufficient documentation

## 2017-06-14 DIAGNOSIS — I110 Hypertensive heart disease with heart failure: Secondary | ICD-10-CM | POA: Insufficient documentation

## 2017-06-14 DIAGNOSIS — I4891 Unspecified atrial fibrillation: Secondary | ICD-10-CM | POA: Insufficient documentation

## 2017-06-14 DIAGNOSIS — I2510 Atherosclerotic heart disease of native coronary artery without angina pectoris: Secondary | ICD-10-CM | POA: Insufficient documentation

## 2017-06-14 DIAGNOSIS — Z95810 Presence of automatic (implantable) cardiac defibrillator: Secondary | ICD-10-CM | POA: Diagnosis not present

## 2017-06-14 DIAGNOSIS — Z8249 Family history of ischemic heart disease and other diseases of the circulatory system: Secondary | ICD-10-CM | POA: Diagnosis not present

## 2017-06-14 DIAGNOSIS — I10 Essential (primary) hypertension: Secondary | ICD-10-CM

## 2017-06-14 DIAGNOSIS — Z888 Allergy status to other drugs, medicaments and biological substances status: Secondary | ICD-10-CM | POA: Insufficient documentation

## 2017-06-14 DIAGNOSIS — G20 Parkinson's disease: Secondary | ICD-10-CM | POA: Diagnosis not present

## 2017-06-14 DIAGNOSIS — E785 Hyperlipidemia, unspecified: Secondary | ICD-10-CM | POA: Insufficient documentation

## 2017-06-14 NOTE — Patient Instructions (Signed)
Continue weighing daily and call for an overnight weight gain of > 2 pounds or a weekly weight gain of >5 pounds. 

## 2017-06-14 NOTE — Progress Notes (Signed)
  HPI   Review of Systems  Constitutional: Positive for fatigue. Negative for appetite change.  HENT: Positive for congestion and rhinorrhea. Negative for sore throat.   Eyes: Negative.   Respiratory: Positive for shortness of breath. Negative for chest tightness.   Cardiovascular: Positive for leg swelling. Negative for chest pain and palpitations.  Gastrointestinal: Positive for abdominal distention. Negative for abdominal pain.  Endocrine: Negative.   Genitourinary: Negative.   Musculoskeletal: Negative for back pain and neck pain.  Skin: Negative.   Allergic/Immunologic: Negative.   Neurological: Negative for dizziness and light-headedness.  Hematological: Negative for adenopathy. Does not bruise/bleed easily.  Psychiatric/Behavioral: Negative for dysphoric mood and sleep disturbance (sleeping on 2 pillows). The patient is not nervous/anxious.      Physical Exam  Constitutional: He is oriented to person, place, and time. He appears well-developed and well-nourished.  HENT:  Head: Normocephalic and atraumatic.  Eyes: Pupils are equal, round, and reactive to light. Conjunctivae are normal.  Neck: Normal range of motion. Neck supple. No JVD present.  Cardiovascular: Normal rate and regular rhythm.   Pulmonary/Chest: Effort normal. He has no wheezes. He has no rales.  Abdominal: Soft. He exhibits distension. There is no tenderness.  Musculoskeletal: He exhibits edema (trace edema with R>L). He exhibits no tenderness.  Neurological: He is alert and oriented to person, place, and time.  Skin: Skin is warm and dry.  Psychiatric: He has a normal mood and affect. His behavior is normal. Thought content normal.  Nursing note and vitals reviewed.

## 2017-06-14 NOTE — Progress Notes (Addendum)
Patient ID: Matthew Howe, male    DOB: Sep 13, 1946, 71 y.o.   MRN: [id number]  HPI This is a 70 y/o male with a history of parkinson's, HTN, hyperlipidemia, GERD, CAD, complete heart block (pacemaker), atrial fibrillation, AICD and chronic heart failure.   Echo done 12/08/16 reviewed and shows an EF of 55-60% along with mild AR/MR. Previous echo was done 07/28/15 and showed an EF of 55-60% along with mild AR. Had a cardiac catheterization done 11/20/12 and showed mild nonobstructive disease.  Had a cardioversion done 11/21/16. Was in the ED 02/01/16 with HF exacerbation. He was treated and released.   He presents today for a follow-up visit with a chief complaint of fatigue with little exertion. He describes this as chronic in nature having been present for several years although does feel like it's worsening. He has associated shortness of breath, gradual weight gain and edema along with this. He denies any chest pain or dizziness.   Past Medical History:  Diagnosis Date  . AICD (automatic cardioverter/defibrillator) present   . Atrial fibrillation ([place])   . CHF (congestive heart failure) ([place])   . Complete heart block ([place])    a. s/p St. Jude PPM 11/2014.  [name] Coronary artery disease   . GERD (gastroesophageal reflux disease)   . Heart murmur   . Hyperlipidemia   . Hypertension   . Parkinson's disease ([place])   . Presence of permanent cardiac pacemaker   . Shortness of breath dyspnea    Past Surgical History:  Procedure Laterality Date  . APPENDECTOMY  12/2010  . CARDIAC CATHETERIZATION  11/20/2012   Known CAD with one vessel coronary disease of left descending artery by cardiac cath.   [name] CARDIOVERSION N/A 11/21/2016   Procedure: CARDIOVERSION;  Surgeon: Deboraha Sprang, MD;  Location: [facility] ORS;  Service: Cardiovascular;  Laterality: N/A;  . COLONOSCOPY  2018  . INSERT / REPLACE / REMOVE PACEMAKER    . PACEMAKER INSERTION  11-21-14   STJ Assurity dual chamber pacemaker implanted by Dr Rayann Heman  for CHB  . PERMANENT PACEMAKER INSERTION N/A 11/21/2014   Procedure: PERMANENT PACEMAKER INSERTION;  Surgeon: Thompson Grayer, MD;  Location: [facility] CATH LAB;  Service: Cardiovascular;  Laterality: N/A;  . TEE WITHOUT CARDIOVERSION N/A 11/21/2016   Procedure: TRANSESOPHAGEAL ECHOCARDIOGRAM (TEE);  Surgeon: Deboraha Sprang, MD;  Location: [facility] ORS;  Service: Cardiovascular;  Laterality: N/A;  . UPPER GI ENDOSCOPY     Family History  Problem Relation Age of Onset  . Heart disease Mother   . Heart disease Father   . Colon cancer Father   . Stroke Sister    Social History  Substance Use Topics  . Smoking status: Never Smoker  . Smokeless tobacco: Never Used  . Alcohol use No   Allergies  Allergen Reactions  . Linzess [Linaclotide] Shortness Of Breath  . Amitiza [Lubiprostone] Other (See Comments)    Abdominal cramps  . Codeine Diarrhea and Nausea And Vomiting  . Norco [Hydrocodone-Acetaminophen] Diarrhea and Nausea And Vomiting  . Omeprazole Diarrhea and Nausea And Vomiting  . Requip  [Ropinirole] Nausea Only   Prior to Admission medications   Medication Sig Start Date End Date Taking? Authorizing Provider  apixaban (ELIQUIS) 5 MG TABS tablet Take 1 tablet (5 mg total) by mouth 2 (two) times daily. 03/20/17  Yes Darylene Price A, FNP  bisacodyl (BISACODYL) 5 MG EC tablet Take 5 mg by mouth every other day.   Yes [provider]  carvedilol (COREG)  12.5 MG tablet TAKE 1 TABLET (12.5 MG TOTAL) BY MOUTH 2 (TWO) TIMES DAILY. 05/18/17 08/16/17 Yes Gollan, Kathlene November, MD  dicyclomine (BENTYL) 20 MG tablet Take 20 mg by mouth 3 (three) times daily with meals.  04/04/13  Yes [provider]  ezetimibe (ZETIA) 10 MG tablet Take 1 tablet (10 mg total) by mouth daily. 02/21/17 06/14/17 Yes Gollan, Kathlene November, MD  fluticasone (FLONASE) 50 MCG/ACT nasal spray Place 2 sprays into the nose daily as needed for allergies.    Yes [provider]  KLOR-CON 10 10 MEQ tablet TAKE 2 TABLETS (20 MEQ  TOTAL) BY MOUTH 2 (TWO) TIMES DAILY AS NEEDED. REPORTED ON 12/24/2015 Patient taking differently: Take 20 mEq by mouth 4 (four) times daily. Reported on 12/24/2015 11/29/16  Yes Minna Merritts, MD  lansoprazole (PREVACID) 15 MG capsule Take 15 mg by mouth as needed (indigestion).    Yes [provider]  lisinopril (PRINIVIL,ZESTRIL) 40 MG tablet Take 1 tablet (40 mg total) by mouth daily. 05/15/17  Yes Darylene Price A, FNP  loratadine (CLARITIN) 10 MG tablet Take 10 mg by mouth daily as needed for allergies.   Yes [provider]  meloxicam (MOBIC) 15 MG tablet Take 15 mg by mouth daily.   Yes [provider]  polyethylene glycol (MIRALAX / GLYCOLAX) packet Take 17 g by mouth every other day.    Yes [provider]  pramipexole (MIRAPEX) 0.5 MG tablet Take 0.5 mg by mouth 2 (two) times daily.   Yes [provider]  selegiline (ELDEPRYL) 5 MG capsule Take 5 mg by mouth 2 (two) times daily with a meal.    Yes [provider]  torsemide (DEMADEX) 20 MG tablet Take 80 mg by mouth daily.    Yes [provider]    Review of Systems  Constitutional: Positive for fatigue. Negative for appetite change.  HENT: Positive for congestion and rhinorrhea. Negative for sore throat.   Eyes: Negative.   Respiratory: Positive for shortness of breath. Negative for chest tightness.   Cardiovascular: Positive for leg swelling. Negative for chest pain and palpitations.  Gastrointestinal: Positive for abdominal distention. Negative for abdominal pain.  Endocrine: Negative.   Genitourinary: Negative.   Musculoskeletal: Negative for back pain and neck pain.  Skin: Negative.   Allergic/Immunologic: Negative.   Neurological: Positive for tremors. Negative for dizziness and light-headedness.  Hematological: Negative for adenopathy. Does not bruise/bleed easily.  Psychiatric/Behavioral: Positive for sleep disturbance (sleeping on 2 pillows; sometimes restless  sleep). Negative for dysphoric mood. The patient is not nervous/anxious.    Vitals:   06/14/17 0859  BP: (!) 153/65  Pulse: 62  Resp: 20  SpO2: 96%  Weight: 260 lb 6 oz (118.1 kg)  Height: 6\' 1"  (1.854 m)   Wt Readings from Last 3 Encounters:  06/14/17 260 lb 6 oz (118.1 kg)  03/21/17 262 lb 8 oz (119.1 kg)  02/07/17 262 lb (118.8 kg)   Lab Results  Component Value Date   CREATININE 1.36 (H) 11/15/2016   CREATININE 1.33 (H) 09/20/2016   CREATININE 1.23 03/23/2016    Physical Exam  Constitutional: He is oriented to person, place, and time. He appears well-developed and well-nourished.  HENT:  Head: Normocephalic and atraumatic.  Eyes: Pupils are equal, round, and reactive to light. Conjunctivae are normal.  Neck: Normal range of motion. Neck supple. No JVD present.  Cardiovascular: Normal rate and regular rhythm.   Pulmonary/Chest: Effort normal. He has no wheezes. He has  no rales.  Abdominal: Soft. He exhibits distension. There is no tenderness.  Musculoskeletal: He exhibits edema (1+ edema with R>L). He exhibits no tenderness.  Neurological: He is alert and oriented to person, place, and time.  Skin: Skin is warm and dry.  Psychiatric: He has a normal mood and affect. His behavior is normal. Thought content normal.  Nursing note and vitals reviewed.  Assessment & Plan:  1: Chronic heart failure with preserved ejection fraction- - NYHA class III - mildly fluid overloaded today; now taking 80mg  torsemide daily - weight up 3 pounds since last here September 2017. Reminded to call for an overnight weight gain of >2 pounds or a weekly weight gain of >5 pounds - has gained close to 50 pounds in the last 2 years - recently started going to the gym - not adding salt to his food and wife is diligent about reading food labels - saw cardiologist Rockey Situ) 03/21/17 and returns in 1 year - discussed doing a sleep study to rule out sleep apnea but patient wants to wait until the  first of the year due to insurance issues - PharmD went in and reviewed medications with the patient  2: HTN- - BP looks good today - saw PCP Ola Spurr) 12/28/16  3: Atrial fibrillation- - cardioversion done 11/21/16 and so far, he has remained in NSR. Reports feeling better since the cardioversion was done.   4: Parkinson's Disease- - continues to have tremors - saw neurologist Melrose Nakayama) 03/20/17 - planning a hunting trip with his son in November 2018  Medication list was reviewed.  Return in 5 months or sooner for any questions/problems before then.   **Pharmacist note below entered in error. Information was from previous visit**

## 2017-06-16 LAB — CUP PACEART REMOTE DEVICE CHECK
Battery Voltage: 2.99 V
Brady Statistic AP VP Percent: 64 %
Brady Statistic AS VP Percent: 36 %
Brady Statistic RA Percent Paced: 64 %
Brady Statistic RV Percent Paced: 99 %
Date Time Interrogation Session: 20180731231713
Implantable Lead Implant Date: 20160212
Implantable Lead Location: 753859
Implantable Lead Model: 1948
Implantable Pulse Generator Implant Date: 20160212
Lead Channel Impedance Value: 530 Ohm
Lead Channel Pacing Threshold Pulse Width: 0.4 ms
Lead Channel Sensing Intrinsic Amplitude: 12 mV
Lead Channel Setting Pacing Amplitude: 1.625
Lead Channel Setting Sensing Sensitivity: 4 mV
MDC IDC LEAD IMPLANT DT: 20160212
MDC IDC LEAD LOCATION: 753860
MDC IDC MSMT BATTERY REMAINING LONGEVITY: 122 mo
MDC IDC MSMT BATTERY REMAINING PERCENTAGE: 95.5 %
MDC IDC MSMT LEADCHNL RA IMPEDANCE VALUE: 430 Ohm
MDC IDC MSMT LEADCHNL RA PACING THRESHOLD AMPLITUDE: 0.625 V
MDC IDC MSMT LEADCHNL RA SENSING INTR AMPL: 2.3 mV
MDC IDC MSMT LEADCHNL RV PACING THRESHOLD AMPLITUDE: 1.375 V
MDC IDC MSMT LEADCHNL RV PACING THRESHOLD PULSEWIDTH: 0.4 ms
MDC IDC SET LEADCHNL RV PACING AMPLITUDE: 1.625
MDC IDC SET LEADCHNL RV PACING PULSEWIDTH: 0.4 ms
MDC IDC STAT BRADY AP VS PERCENT: 1 %
MDC IDC STAT BRADY AS VS PERCENT: 1 %
Pulse Gen Model: 2240
Pulse Gen Serial Number: 3049931

## 2017-06-22 ENCOUNTER — Other Ambulatory Visit: Payer: Self-pay | Admitting: Cardiovascular Disease

## 2017-06-22 MED ORDER — TORSEMIDE 20 MG PO TABS: 80.0000 mg | ORAL_TABLET | Freq: Every day | ORAL | 0 refills | Status: DC

## 2017-07-13 ENCOUNTER — Other Ambulatory Visit: Payer: Self-pay | Admitting: Cardiovascular Disease

## 2017-07-18 ENCOUNTER — Other Ambulatory Visit: Payer: Self-pay

## 2017-07-18 NOTE — Telephone Encounter (Signed)
Ms. Diekman called to request refill on patients Eliquis. I informed her that the medication was sent to [facility] on 07/13/2017 by Dr. Gwenyth Ober office and to check with the pharmacy.

## 2017-07-19 ENCOUNTER — Telehealth: Payer: Self-pay | Admitting: Cardiovascular Disease

## 2017-07-19 ENCOUNTER — Other Ambulatory Visit: Payer: Self-pay | Admitting: Cardiovascular Disease

## 2017-07-19 MED ORDER — APIXABAN 5 MG PO TABS: 5.0000 mg | ORAL_TABLET | Freq: Two times a day (BID) | ORAL | 2 refills | Status: DC

## 2017-07-19 NOTE — Telephone Encounter (Signed)
Pt is male, wt 118.1 kg, age 71, Serum creatinine 1.36, creatinine clearance 84.43. Eliquis 5 mg BID refills sent to [place].

## 2017-07-19 NOTE — Telephone Encounter (Signed)
°*  STAT* If patient is at the pharmacy, call can be transferred to refill team.   1. Which medications need to be refilled? (please list name of each medication and dose if known)   Eliquis 5 mg po BID  2. Which pharmacy/location (including street and city if local pharmacy) is medication to be sent to?  [facility]   3. Do they need a 30 day or 90 day supply? [name]

## 2017-08-08 ENCOUNTER — Ambulatory Visit (INDEPENDENT_AMBULATORY_CARE_PROVIDER_SITE_OTHER): Payer: Medicare Other | Admitting: Internal Medicine

## 2017-08-08 ENCOUNTER — Ambulatory Visit (INDEPENDENT_AMBULATORY_CARE_PROVIDER_SITE_OTHER): Payer: Medicare Other | Admitting: *Deleted

## 2017-08-08 ENCOUNTER — Encounter: Payer: Self-pay | Admitting: Internal Medicine

## 2017-08-08 VITALS — BP 132/68 | HR 60 | Ht 73.0 in | Wt 258.2 lb

## 2017-08-08 DIAGNOSIS — I5032 Chronic diastolic (congestive) heart failure: Secondary | ICD-10-CM

## 2017-08-08 DIAGNOSIS — Z79899 Other long term (current) drug therapy: Secondary | ICD-10-CM | POA: Diagnosis not present

## 2017-08-08 DIAGNOSIS — I10 Essential (primary) hypertension: Secondary | ICD-10-CM | POA: Diagnosis not present

## 2017-08-08 DIAGNOSIS — R001 Bradycardia, unspecified: Secondary | ICD-10-CM

## 2017-08-08 DIAGNOSIS — I480 Paroxysmal atrial fibrillation: Secondary | ICD-10-CM | POA: Diagnosis not present

## 2017-08-08 DIAGNOSIS — I442 Atrioventricular block, complete: Secondary | ICD-10-CM

## 2017-08-08 NOTE — Progress Notes (Signed)
Patient Care Team: Leonel Ramsay, MD as PCP - General (Infectious Diseases) Alisa Graff, FNP as Nurse Practitioner (Cardiology) Minna Merritts, MD as Consulting Physician (Cardiology) Anabel Bene, MD as Referring Physician (Neurology)   HPI  Matthew Howe is a 71 y.o. male Seen following pacemaker implantation 2/16 by Dr. Greggory Brandy . He had presented to [facility] with chest pain and shortness of breath. He was found to be in complete heart block. He had an antecedent history of bifascicular block   seen 2/18. At that time shortness of breath worsening SOB seemed to correlate  with persistence of atrial fibrillation. Cardioversion was accomplished.  He noted no significant difference.  He has had no intermittent atrial fibrillation;    Has not tolerated zetia--previoulsy did not tolerate statins   Some edema, but attentive to sodium and adjust diuretics as needed  No chest pain; chronic DOE    Catheterization 2/14 had demonstrated mild nonobstructive disease. DATE TEST    10/16    echo   EF 55-60 %   3/18    Echo   EF 55-60 %         Date Cr Hgb  2/18 1.36 14.7          He also has Parkinson's;  he continues to push through it. Still  Working      Past Medical History:  Diagnosis Date  . AICD (automatic cardioverter/defibrillator) present   . Atrial fibrillation ([place])   . CHF (congestive heart failure) ([place])   . Complete heart block ([place])    a. s/p St. Jude PPM 11/2014.  [name] Coronary artery disease   . GERD (gastroesophageal reflux disease)   . Heart murmur   . Hyperlipidemia   . Hypertension   . Parkinson's disease ([place])   . Presence of permanent cardiac pacemaker   . Shortness of breath dyspnea     Past Surgical History:  Procedure Laterality Date  . APPENDECTOMY  12/2010  . CARDIAC CATHETERIZATION  11/20/2012   Known CAD with one vessel coronary disease of left descending artery by cardiac cath.   [name] CARDIOVERSION N/A 11/21/2016   Procedure:  CARDIOVERSION;  Surgeon: Deboraha Sprang, MD;  Location: [facility] ORS;  Service: Cardiovascular;  Laterality: N/A;  . COLONOSCOPY  2018  . INSERT / REPLACE / REMOVE PACEMAKER    . PACEMAKER INSERTION  11-21-14   STJ Assurity dual chamber pacemaker implanted by Dr Rayann Heman for CHB  . PERMANENT PACEMAKER INSERTION N/A 11/21/2014   Procedure: PERMANENT PACEMAKER INSERTION;  Surgeon: Thompson Grayer, MD;  Location: [facility] CATH LAB;  Service: Cardiovascular;  Laterality: N/A;  . TEE WITHOUT CARDIOVERSION N/A 11/21/2016   Procedure: TRANSESOPHAGEAL ECHOCARDIOGRAM (TEE);  Surgeon: Deboraha Sprang, MD;  Location: [facility] ORS;  Service: Cardiovascular;  Laterality: N/A;  . UPPER GI ENDOSCOPY      Current Outpatient Prescriptions  Medication Sig Dispense Refill  . apixaban (ELIQUIS) 5 MG TABS tablet Take 1 tablet (5 mg total) by mouth 2 (two) times daily. 180 tablet 2  . bisacodyl (BISACODYL) 5 MG EC tablet Take 5 mg by mouth every other day.    . carvedilol (COREG) 12.5 MG tablet TAKE 1 TABLET (12.5 MG TOTAL) BY MOUTH 2 (TWO) TIMES DAILY. 180 tablet 3  . dicyclomine (BENTYL) 20 MG tablet Take 20 mg by mouth 3 (three) times daily with meals.     . fluticasone (FLONASE) 50 MCG/ACT nasal spray Place 2 sprays into the nose  daily as needed for allergies.     [name] KLOR-CON 10 10 MEQ tablet TAKE 2 TABLETS (20 MEQ TOTAL) BY MOUTH 2 (TWO) TIMES DAILY AS NEEDED. REPORTED ON 12/24/2015 120 tablet 2  . lansoprazole (PREVACID) 15 MG capsule Take 15 mg by mouth as needed (indigestion).     [name] lisinopril (PRINIVIL,ZESTRIL) 40 MG tablet Take 1 tablet (40 mg total) by mouth daily. 30 tablet 6  . loratadine (CLARITIN) 10 MG tablet Take 10 mg by mouth daily as needed for allergies.    . meloxicam (MOBIC) 15 MG tablet Take 15 mg by mouth daily.    . polyethylene glycol (MIRALAX / GLYCOLAX) packet Take 17 g by mouth every other day.     . pramipexole (MIRAPEX) 0.5 MG tablet Take 0.5 mg by mouth 2 (two) times daily.    . selegiline (ELDEPRYL) 5  MG capsule Take 5 mg by mouth 2 (two) times daily with a meal.     . torsemide (DEMADEX) 20 MG tablet TAKE 4 TABLETS (80 MG TOTAL) BY MOUTH DAILY. 120 tablet 0   No current facility-administered medications for this visit.     Allergies  Allergen Reactions  . Linzess [Linaclotide] Shortness Of Breath  . Amitiza [Lubiprostone] Other (See Comments)    Abdominal cramps  . Codeine Diarrhea and Nausea And Vomiting  . Norco [Hydrocodone-Acetaminophen] Diarrhea and Nausea And Vomiting  . Omeprazole Diarrhea and Nausea And Vomiting  . Requip  [Ropinirole] Nausea Only    Review of Systems negative except from HPI and PMH  Physical Exam BP 132/68 (BP Location: Left Arm, Patient Position: Sitting, Cuff Size: Normal)   Pulse 60   Ht 6\' 1"  (1.854 m)   Wt 258 lb 4 oz (117.1 kg)   BMI 34.07 kg/m  Well developed and nourished in no acute distress HENT normal Neck supple with JVP-flat Clear Regular rate and rhythm, no murmurs or gallops Abd-soft with active BS No Clubbing cyanosis tr edema Skin-warm and dry A & Oriented  Grossly normal sensory and motor function   ECG demonstrates sinus . P-synchronous/ AV  pacing   Assessment and  Plan  Complete heart block  Pacemaker-St. Jude  Parkinson's disease  Hypertension  Hypertensive heart disease   Leg pain   HFpEF  Atrial fibrillation-persistent  Holding sinus  Euvolemic continue current meds  On Anticoagulation;  No bleeding issues   Check Hgb today  Will stop zetia 2/2 side effects   With NICM no indication for PCSK9      Device function is normal; was not reprogrammed

## 2017-08-08 NOTE — Patient Instructions (Signed)
Medication Instructions:  Your physician has recommended you make the following change in your medication:  1- STOP TAKING Zetia.   Labwork: Your physician recommends that you return for lab work in: TODAY(CBC, BMP.)   Testing/Procedures: none  Follow-Up: Your physician recommends that you schedule a follow-up appointment in: [place].  If you need a refill on your cardiac medications before your next appointment, please call your pharmacy.

## 2017-08-08 NOTE — Progress Notes (Signed)
Remote pacemaker transmission.   

## 2017-08-09 LAB — CBC WITH DIFFERENTIAL/PLATELET
BASOS ABS: 0 10*3/uL (ref 0.0–0.2)
Basos: 0 %
EOS (ABSOLUTE): 0.2 10*3/uL (ref 0.0–0.4)
Eos: 3 %
HEMOGLOBIN: 13.3 g/dL (ref 13.0–17.7)
Hematocrit: 38.9 % (ref 37.5–51.0)
IMMATURE GRANS (ABS): 0 10*3/uL (ref 0.0–0.1)
IMMATURE GRANULOCYTES: 0 %
LYMPHS: 13 %
Lymphocytes Absolute: 1.1 10*3/uL (ref 0.7–3.1)
MCH: 29.7 pg (ref 26.6–33.0)
MCHC: 34.2 g/dL (ref 31.5–35.7)
MCV: 87 fL (ref 79–97)
MONOCYTES: 8 %
Monocytes Absolute: 0.7 10*3/uL (ref 0.1–0.9)
NEUTROS ABS: 6.2 10*3/uL (ref 1.4–7.0)
NEUTROS PCT: 76 %
PLATELETS: 288 10*3/uL (ref 150–379)
RBC: 4.48 x10E6/uL (ref 4.14–5.80)
RDW: 14.6 % (ref 12.3–15.4)
WBC: 8.2 10*3/uL (ref 3.4–10.8)

## 2017-08-09 LAB — BASIC METABOLIC PANEL
BUN/Creatinine Ratio: 24 (ref 10–24)
BUN: 33 mg/dL — AB (ref 8–27)
CALCIUM: 9.3 mg/dL (ref 8.6–10.2)
CHLORIDE: 101 mmol/L (ref 96–106)
CO2: 24 mmol/L (ref 20–29)
Creatinine, Ser: 1.39 mg/dL — ABNORMAL HIGH (ref 0.76–1.27)
GFR calc non Af Amer: 51 mL/min/{1.73_m2} — ABNORMAL LOW (ref >59)
GFR, EST AFRICAN AMERICAN: 59 mL/min/{1.73_m2} — AB (ref >59)
Glucose: 108 mg/dL — ABNORMAL HIGH (ref 65–99)
Potassium: 4.8 mmol/L (ref 3.5–5.2)
Sodium: 142 mmol/L (ref 134–144)

## 2017-08-10 ENCOUNTER — Telehealth: Payer: Self-pay

## 2017-08-10 NOTE — Telephone Encounter (Signed)
Pt is aware and agreeable to normal results  

## 2017-08-10 NOTE — Telephone Encounter (Signed)
lmtcb for lab results

## 2017-08-16 ENCOUNTER — Encounter: Payer: Self-pay | Admitting: Cardiology

## 2017-08-18 LAB — CUP PACEART INCLINIC DEVICE CHECK
Battery Voltage: 2.99 V
Brady Statistic RA Percent Paced: 62 %
Brady Statistic RV Percent Paced: 99.57 %
Implantable Lead Location: 753859
Implantable Lead Model: 1948
Lead Channel Impedance Value: 512.5 Ohm
Lead Channel Impedance Value: 575 Ohm
Lead Channel Pacing Threshold Amplitude: 0.75 V
Lead Channel Pacing Threshold Pulse Width: 0.4 ms
Lead Channel Sensing Intrinsic Amplitude: 12 mV
Lead Channel Sensing Intrinsic Amplitude: 2.1 mV
Lead Channel Setting Pacing Amplitude: 1.625
MDC IDC LEAD IMPLANT DT: 20160212
MDC IDC LEAD IMPLANT DT: 20160212
MDC IDC LEAD LOCATION: 753860
MDC IDC MSMT BATTERY REMAINING LONGEVITY: 121 mo
MDC IDC MSMT LEADCHNL RA PACING THRESHOLD PULSEWIDTH: 0.4 ms
MDC IDC MSMT LEADCHNL RV PACING THRESHOLD AMPLITUDE: 1.375 V
MDC IDC PG IMPLANT DT: 20160212
MDC IDC SESS DTM: 20181030134145
MDC IDC SET LEADCHNL RV PACING AMPLITUDE: 1.625
MDC IDC SET LEADCHNL RV PACING PULSEWIDTH: 0.4 ms
MDC IDC SET LEADCHNL RV SENSING SENSITIVITY: 4 mV
Pulse Gen Serial Number: 3049931

## 2017-08-29 LAB — CUP PACEART REMOTE DEVICE CHECK
Battery Remaining Longevity: 121 mo
Brady Statistic AP VS Percent: 1 %
Brady Statistic AS VP Percent: 37 %
Brady Statistic RA Percent Paced: 62 %
Brady Statistic RV Percent Paced: 99 %
Date Time Interrogation Session: 20181029235725
Implantable Lead Implant Date: 20160212
Implantable Lead Location: 753859
Implantable Lead Location: 753860
Implantable Lead Model: 1948
Lead Channel Impedance Value: 450 Ohm
Lead Channel Impedance Value: 530 Ohm
Lead Channel Pacing Threshold Amplitude: 1.625 V
Lead Channel Pacing Threshold Pulse Width: 0.4 ms
Lead Channel Sensing Intrinsic Amplitude: 12 mV
Lead Channel Setting Pacing Amplitude: 1.625
Lead Channel Setting Pacing Pulse Width: 0.4 ms
Lead Channel Setting Sensing Sensitivity: 4 mV
MDC IDC LEAD IMPLANT DT: 20160212
MDC IDC MSMT BATTERY REMAINING PERCENTAGE: 95.5 %
MDC IDC MSMT BATTERY VOLTAGE: 2.99 V
MDC IDC MSMT LEADCHNL RA PACING THRESHOLD AMPLITUDE: 0.625 V
MDC IDC MSMT LEADCHNL RA SENSING INTR AMPL: 1.5 mV
MDC IDC MSMT LEADCHNL RV PACING THRESHOLD PULSEWIDTH: 0.4 ms
MDC IDC PG IMPLANT DT: 20160212
MDC IDC SET LEADCHNL RV PACING AMPLITUDE: 1.875
MDC IDC STAT BRADY AP VP PERCENT: 63 %
MDC IDC STAT BRADY AS VS PERCENT: 1 %
Pulse Gen Model: 2240
Pulse Gen Serial Number: 3049931

## 2017-10-11 ENCOUNTER — Telehealth: Payer: Self-pay | Admitting: Cardiovascular Disease

## 2017-10-11 DIAGNOSIS — I10 Essential (primary) hypertension: Secondary | ICD-10-CM

## 2017-10-11 DIAGNOSIS — I480 Paroxysmal atrial fibrillation: Secondary | ICD-10-CM

## 2017-10-11 DIAGNOSIS — I5032 Chronic diastolic (congestive) heart failure: Secondary | ICD-10-CM

## 2017-10-11 NOTE — Telephone Encounter (Signed)
Pt wife states pt has been taking double his Torsemide, and his Potassium he has doubled this also, due his swelling. Please call. This has been going on for 2-3 months. Pt c/o swelling: STAT is pt has developed SOB within 24 hours  1) How much weight have you gained and in what time span? none  2) If swelling, where is the swelling located? In both ankles and feet  3) Are you currently taking a fluid pill? yes  4) Are you currently SOB? Not if he doubles up on his medication  5) Do you have a log of your daily weights (if so, list)? no  6) Have you gained 3 pounds in a day or 5 pounds in a week? no  7) Have you traveled recently? 1st week in November, he went to [place]

## 2017-10-11 NOTE — Telephone Encounter (Signed)
Spoke with patients wife per release form and she reports that for the last few months he has had to increase both his potassium and lasix. He was taking 2 tablets 20 mg twice a day but he has been taking 2 tablets three times daily for the last few months. She reports that if he doesn't take that he is out of breath and he has extra fluid on board. She states that since he has been doing this his prescription has been running short. She reports that he went on hunting trip and got along well with that but that he is still taking the extra doses. She reports his weights range from 260-264 and that he does better with those extra doses and would like to know if we could send in script for increased doses. Reviewed that he should try to limit his salt intake and limit fluid intake as well. Advised that I would need to review with Dr. Rockey Situ before sending that in because we may need some updated lab work done due to increase in doses. She verbalized understanding of our conversation, agreement with plan, and had no further questions at this time.   So he is taking the following:  Torsemide 20 mg 2 tablets Three times daily Klor-Con 10 meq 2 tablets Three times daily

## 2017-10-12 NOTE — Telephone Encounter (Signed)
Patient wife calling back to check on status Per Pam told her we are still waiting on Dr Rockey Situ to review Patient states they would like to hear something by tomorrow Patient is almost out of fluid pills

## 2017-10-13 MED ORDER — POTASSIUM CHLORIDE ER 10 MEQ PO TBCR: 30.0000 meq | EXTENDED_RELEASE_TABLET | Freq: Two times a day (BID) | ORAL | 6 refills | Status: DC

## 2017-10-13 MED ORDER — TORSEMIDE 20 MG PO TABS: 60.0000 mg | ORAL_TABLET | Freq: Two times a day (BID) | ORAL | 6 refills | Status: DC

## 2017-10-13 NOTE — Telephone Encounter (Signed)
After discussion with Dr. Rockey Situ regarding patients medications, called and spoke with patients wife and reviewed Dr. Donivan Scull recommendations. Instructed her to have him take Torsemide 20 mg 3 tablets twice daily along with Klor-con 10 meq 3 tablets twice daily. Also requested that he got to [facility] to have labs done at some point so we can keep a eye on his numbers. She verbalized understanding of all instructions, read back information, was agreeable with plan, and had no further questions at this time. Refills sent in to pharmacy requested and she had no further concerns.

## 2017-10-16 ENCOUNTER — Other Ambulatory Visit: Payer: Self-pay | Admitting: Cardiovascular Disease

## 2017-10-17 ENCOUNTER — Other Ambulatory Visit
Admission: RE | Admit: 2017-10-17 | Discharge: 2017-10-17 | Disposition: A | Discharge status: Home / Self Care | Payer: Medicare Other | Source: Ambulatory Visit | Attending: Cardiovascular Disease | Admitting: Cardiovascular Disease

## 2017-10-17 DIAGNOSIS — I110 Hypertensive heart disease with heart failure: Secondary | ICD-10-CM | POA: Diagnosis not present

## 2017-10-17 DIAGNOSIS — I480 Paroxysmal atrial fibrillation: Secondary | ICD-10-CM | POA: Insufficient documentation

## 2017-10-17 DIAGNOSIS — I5032 Chronic diastolic (congestive) heart failure: Secondary | ICD-10-CM | POA: Insufficient documentation

## 2017-10-17 DIAGNOSIS — I10 Essential (primary) hypertension: Secondary | ICD-10-CM | POA: Diagnosis present

## 2017-10-17 LAB — BASIC METABOLIC PANEL
Anion gap: 10 (ref 5–15)
BUN: 34 mg/dL — ABNORMAL HIGH (ref 6–20)
CHLORIDE: 103 mmol/L (ref 101–111)
CO2: 26 mmol/L (ref 22–32)
Calcium: 9.2 mg/dL (ref 8.9–10.3)
Creatinine, Ser: 1.38 mg/dL — ABNORMAL HIGH (ref 0.61–1.24)
GFR calc Af Amer: 58 mL/min — ABNORMAL LOW (ref >60)
GFR calc non Af Amer: 50 mL/min — ABNORMAL LOW (ref >60)
GLUCOSE: 130 mg/dL — AB (ref 65–99)
POTASSIUM: 4.5 mmol/L (ref 3.5–5.1)
SODIUM: 139 mmol/L (ref 135–145)

## 2017-10-24 NOTE — Progress Notes (Signed)
Patient ID: Matthew Howe, male    DOB: 1945-12-04, 72 y.o.   MRN: [id number]  HPI This is a 72 y/o male with a history of parkinson's, HTN, hyperlipidemia, GERD, CAD, complete heart block (pacemaker), atrial fibrillation, AICD and chronic heart failure.   Echo done 12/08/16 reviewed and shows an EF of 55-60% along with mild AR/MR. Previous echo was done 07/28/15 and showed an EF of 55-60% along with mild AR. Had a cardiac catheterization done 11/20/12 and showed mild nonobstructive disease.  Has not been admitted or been in the ED in the last 6 months.   He presents today for a follow-up visit with a chief complaint of moderate fatigue upon minimal exertion. He says this has been present for several years with varying levels of severity. He does feel like he has more energy lately. He has associated shortness of breath, edema, abdominal distention, difficulty sleeping at times and continued tremors. He denies any chest pain, palpitations, dizziness or weight gain.   Past Medical History:  Diagnosis Date  . AICD (automatic cardioverter/defibrillator) present   . Atrial fibrillation ([place])   . CHF (congestive heart failure) ([place])   . Complete heart block ([place])    a. s/p St. Jude PPM 11/2014.  [name] Coronary artery disease   . GERD (gastroesophageal reflux disease)   . Heart murmur   . Hyperlipidemia   . Hypertension   . Parkinson's disease ([place])   . Presence of permanent cardiac pacemaker   . Shortness of breath dyspnea    Past Surgical History:  Procedure Laterality Date  . APPENDECTOMY  12/2010  . CARDIAC CATHETERIZATION  11/20/2012   Known CAD with one vessel coronary disease of left descending artery by cardiac cath.   [name] CARDIOVERSION N/A 11/21/2016   Procedure: CARDIOVERSION;  Surgeon: Deboraha Sprang, MD;  Location: [facility] ORS;  Service: Cardiovascular;  Laterality: N/A;  . COLONOSCOPY  2018  . INSERT / REPLACE / REMOVE PACEMAKER    . PACEMAKER INSERTION  11-21-14   STJ Assurity dual  chamber pacemaker implanted by Dr Rayann Heman for CHB  . PERMANENT PACEMAKER INSERTION N/A 11/21/2014   Procedure: PERMANENT PACEMAKER INSERTION;  Surgeon: Thompson Grayer, MD;  Location: [facility] CATH LAB;  Service: Cardiovascular;  Laterality: N/A;  . TEE WITHOUT CARDIOVERSION N/A 11/21/2016   Procedure: TRANSESOPHAGEAL ECHOCARDIOGRAM (TEE);  Surgeon: Deboraha Sprang, MD;  Location: [facility] ORS;  Service: Cardiovascular;  Laterality: N/A;  . UPPER GI ENDOSCOPY     Family History  Problem Relation Age of Onset  . Heart disease Mother   . Heart disease Father   . Colon cancer Father   . Stroke Sister    Social History   Tobacco Use  . Smoking status: Never Smoker  . Smokeless tobacco: Never Used  Substance Use Topics  . Alcohol use: No    Alcohol/week: 0.0 oz   Allergies  Allergen Reactions  . Linzess [Linaclotide] Shortness Of Breath  . Amitiza [Lubiprostone] Other (See Comments)    Abdominal cramps  . Codeine Diarrhea and Nausea And Vomiting  . Norco [Hydrocodone-Acetaminophen] Diarrhea and Nausea And Vomiting  . Omeprazole Diarrhea and Nausea And Vomiting  . Requip  [Ropinirole] Nausea Only   Prior to Admission medications   Medication Sig Start Date End Date Taking? Authorizing Provider  apixaban (ELIQUIS) 5 MG TABS tablet Take 1 tablet (5 mg total) by mouth 2 (two) times daily. 07/19/17  Yes Minna Merritts, MD  bisacodyl (BISACODYL) 5 MG EC tablet Take  5 mg by mouth daily as needed.    Yes [provider]  dicyclomine (BENTYL) 20 MG tablet Take 20 mg by mouth 3 (three) times daily with meals.  04/04/13  Yes [provider]  fluticasone (FLONASE) 50 MCG/ACT nasal spray Place 2 sprays into the nose daily as needed for allergies.    Yes [provider]  lansoprazole (PREVACID) 15 MG capsule Take 15 mg by mouth as needed (indigestion).    Yes [provider]  lisinopril (PRINIVIL,ZESTRIL) 40 MG tablet Take 1 tablet (40 mg total) by mouth daily. 05/15/17  Yes  Darylene Price A, FNP  loratadine (CLARITIN) 10 MG tablet Take 10 mg by mouth daily as needed for allergies.   Yes [provider]  meloxicam (MOBIC) 15 MG tablet Take 15 mg by mouth daily as needed.    Yes [provider]  polyethylene glycol (MIRALAX / GLYCOLAX) packet Take 17 g by mouth daily as needed.    Yes [provider]  potassium chloride (KLOR-CON 10) 10 MEQ tablet Take 3 tablets (30 mEq total) by mouth 2 (two) times daily. Reported on 12/24/2015 10/13/17  Yes Gollan, Kathlene November, MD  pramipexole (MIRAPEX) 0.5 MG tablet Take 0.5 mg by mouth 2 (two) times daily.   Yes [provider]  selegiline (ELDEPRYL) 5 MG capsule Take 5 mg by mouth 2 (two) times daily with a meal.    Yes [provider]  torsemide (DEMADEX) 20 MG tablet Take 60 mg by mouth 2 (two) times daily.   Yes [provider]  carvedilol (COREG) 12.5 MG tablet TAKE 1 TABLET (12.5 MG TOTAL) BY MOUTH 2 (TWO) TIMES DAILY. 05/18/17 08/16/17  Minna Merritts, MD   Review of Systems  Constitutional: Positive for fatigue. Negative for appetite change.  HENT: Positive for rhinorrhea. Negative for congestion and sore throat.   Eyes: Negative.   Respiratory: Positive for shortness of breath. Negative for chest tightness.   Cardiovascular: Positive for leg swelling. Negative for chest pain and palpitations.  Gastrointestinal: Positive for abdominal distention. Negative for abdominal pain.  Endocrine: Negative.   Genitourinary: Negative.   Musculoskeletal: Negative for back pain and neck pain.  Skin: Negative.   Allergic/Immunologic: Negative.   Neurological: Positive for tremors. Negative for dizziness and light-headedness.  Hematological: Negative for adenopathy. Does not bruise/bleed easily.  Psychiatric/Behavioral: Positive for sleep disturbance (sleeping on 2 pillows; sometimes restless sleep). Negative for dysphoric mood. The patient is not nervous/anxious.    Vitals:    10/26/17 0907  BP: (!) 120/54  Pulse: (!) 59  Resp: 18  SpO2: 95%  Weight: 258 lb 6 oz (117.2 kg)  Height: 6\' 1"  (1.854 m)   Wt Readings from Last 3 Encounters:  10/26/17 258 lb 6 oz (117.2 kg)  08/08/17 258 lb 4 oz (117.1 kg)  06/14/17 260 lb 6 oz (118.1 kg)   Lab Results  Component Value Date   CREATININE 1.38 (H) 10/17/2017   CREATININE 1.39 (H) 08/08/2017   CREATININE 1.36 (H) 11/15/2016    Physical Exam  Constitutional: He is oriented to person, place, and time. He appears well-developed and well-nourished.  HENT:  Head: Normocephalic and atraumatic.  Eyes: Conjunctivae are normal. Pupils are equal, round, and reactive to light.  Neck: Normal range of motion. Neck supple. No JVD present.  Cardiovascular: Normal rate and regular rhythm.  Pulmonary/Chest: Effort normal. He has no wheezes. He has no rales.  Abdominal: Soft. He exhibits distension. There is no tenderness.  Musculoskeletal: He exhibits edema (1+ edema with R>L). He exhibits no tenderness.  Neurological: He is alert and oriented to person, place, and time.  Skin: Skin is warm and dry.  Psychiatric: He has a normal mood and affect. His behavior is normal. Thought content normal.  Nursing note and vitals reviewed.  Assessment & Plan:  1: Chronic heart failure with preserved ejection fraction- - NYHA class III - mildly fluid overloaded today; now taking 60mg  torsemide BID and feels like edema is much better - weight gradually coming down. Reminded to call for an overnight weight gain of >2 pounds or a weekly weight gain of >5 pounds - trying to get more active after the holidays - not adding salt to his food and wife is diligent about reading food labels - saw cardiologist Rockey Situ) 03/21/17 and returns in 1 year - PharmD went in and reviewed medications with the patient - patient reports receiving his pneumonia vaccine for this season; does not get the flu vaccine  2: HTN- - BP looks good today - saw PCP  Ola Spurr) 06/28/17 - BMP from 10/17/17 reviewed and showed sodium 139, potassium 4.5 and GFR 50.  3: Atrial fibrillation- - cardioversion done 11/21/16 and so far, he has remained in NSR. Reports feeling better since the cardioversion was done.   4: Parkinson's Disease- - continues to have tremors - saw neurologist Melrose Nakayama) 03/20/17 - went on a hunting trip with his son in November 2018  Medication list was reviewed.  Return in 4 months or sooner for any questions/problems before then.

## 2017-10-26 ENCOUNTER — Encounter: Payer: Self-pay | Admitting: Family

## 2017-10-26 ENCOUNTER — Ambulatory Visit: Payer: Medicare Other | Attending: Family | Admitting: Family

## 2017-10-26 VITALS — BP 120/54 | HR 59 | Resp 18 | Ht 73.0 in | Wt 258.4 lb

## 2017-10-26 DIAGNOSIS — I2510 Atherosclerotic heart disease of native coronary artery without angina pectoris: Secondary | ICD-10-CM | POA: Insufficient documentation

## 2017-10-26 DIAGNOSIS — R0602 Shortness of breath: Secondary | ICD-10-CM | POA: Diagnosis not present

## 2017-10-26 DIAGNOSIS — Z950 Presence of cardiac pacemaker: Secondary | ICD-10-CM | POA: Insufficient documentation

## 2017-10-26 DIAGNOSIS — I442 Atrioventricular block, complete: Secondary | ICD-10-CM | POA: Insufficient documentation

## 2017-10-26 DIAGNOSIS — Z23 Encounter for immunization: Secondary | ICD-10-CM | POA: Insufficient documentation

## 2017-10-26 DIAGNOSIS — I5032 Chronic diastolic (congestive) heart failure: Secondary | ICD-10-CM | POA: Insufficient documentation

## 2017-10-26 DIAGNOSIS — I10 Essential (primary) hypertension: Secondary | ICD-10-CM

## 2017-10-26 DIAGNOSIS — K219 Gastro-esophageal reflux disease without esophagitis: Secondary | ICD-10-CM | POA: Insufficient documentation

## 2017-10-26 DIAGNOSIS — E785 Hyperlipidemia, unspecified: Secondary | ICD-10-CM | POA: Insufficient documentation

## 2017-10-26 DIAGNOSIS — I110 Hypertensive heart disease with heart failure: Secondary | ICD-10-CM | POA: Insufficient documentation

## 2017-10-26 DIAGNOSIS — I4891 Unspecified atrial fibrillation: Secondary | ICD-10-CM | POA: Insufficient documentation

## 2017-10-26 DIAGNOSIS — G20 Parkinson's disease: Secondary | ICD-10-CM | POA: Insufficient documentation

## 2017-10-26 DIAGNOSIS — Z95810 Presence of automatic (implantable) cardiac defibrillator: Secondary | ICD-10-CM | POA: Insufficient documentation

## 2017-10-26 DIAGNOSIS — Z79899 Other long term (current) drug therapy: Secondary | ICD-10-CM | POA: Insufficient documentation

## 2017-10-26 DIAGNOSIS — I480 Paroxysmal atrial fibrillation: Secondary | ICD-10-CM

## 2017-10-26 NOTE — Patient Instructions (Signed)
Continue weighing daily and call for an overnight weight gain of > 2 pounds or a weekly weight gain of >5 pounds. 

## 2017-10-30 ENCOUNTER — Telehealth: Payer: Self-pay | Admitting: *Deleted

## 2017-10-30 NOTE — Telephone Encounter (Signed)
Matthew Howe returned call. He reports he was likely sleeping at the time of the episode. He has not been symptomatic, he has taken his medications as prescribed and reports that he has felt the best that he has in a [place] time. I advised that I would have Dr. Caryl Comes review the episode and we will contact him with any recommendations. He verbalizes understanding and is appreciative.

## 2017-10-30 NOTE — Telephone Encounter (Signed)
Alert via [place]- HVR episode 10/27/17 0244- 47+ beats NSVT.  LMOM (per DPR) to return call to [facility].

## 2017-10-31 DIAGNOSIS — M6289 Other specified disorders of muscle: Secondary | ICD-10-CM | POA: Diagnosis not present

## 2017-10-31 DIAGNOSIS — G20 Parkinson's disease: Secondary | ICD-10-CM | POA: Diagnosis not present

## 2017-10-31 DIAGNOSIS — M791 Myalgia, unspecified site: Secondary | ICD-10-CM | POA: Diagnosis not present

## 2017-11-07 ENCOUNTER — Ambulatory Visit (INDEPENDENT_AMBULATORY_CARE_PROVIDER_SITE_OTHER): Payer: Medicare Other | Admitting: *Deleted

## 2017-11-07 DIAGNOSIS — I442 Atrioventricular block, complete: Secondary | ICD-10-CM

## 2017-11-07 NOTE — Progress Notes (Signed)
Remote pacemaker transmission.   

## 2017-11-09 ENCOUNTER — Encounter: Payer: Self-pay | Admitting: Cardiology

## 2017-11-20 ENCOUNTER — Other Ambulatory Visit: Payer: Self-pay

## 2017-11-20 MED ORDER — LISINOPRIL 40 MG PO TABS: 40.0000 mg | ORAL_TABLET | Freq: Every day | ORAL | 3 refills | Status: DC

## 2017-11-22 LAB — CUP PACEART REMOTE DEVICE CHECK
Battery Remaining Longevity: 123 mo
Battery Remaining Percentage: 95.5 %
Brady Statistic AP VS Percent: 1 %
Brady Statistic AS VS Percent: 1 %
Date Time Interrogation Session: 20190129070020
Implantable Lead Implant Date: 20160212
Implantable Lead Implant Date: 20160212
Implantable Lead Location: 753860
Implantable Lead Model: 1948
Lead Channel Impedance Value: 450 Ohm
Lead Channel Pacing Threshold Amplitude: 1.5 V
Lead Channel Pacing Threshold Pulse Width: 0.4 ms
Lead Channel Sensing Intrinsic Amplitude: 3.5 mV
Lead Channel Setting Pacing Amplitude: 1.75 V
Lead Channel Setting Pacing Pulse Width: 0.4 ms
Lead Channel Setting Sensing Sensitivity: 4 mV
MDC IDC LEAD LOCATION: 753859
MDC IDC MSMT BATTERY VOLTAGE: 2.98 V
MDC IDC MSMT LEADCHNL RA PACING THRESHOLD AMPLITUDE: 0.5 V
MDC IDC MSMT LEADCHNL RA PACING THRESHOLD PULSEWIDTH: 0.4 ms
MDC IDC MSMT LEADCHNL RV IMPEDANCE VALUE: 530 Ohm
MDC IDC MSMT LEADCHNL RV SENSING INTR AMPL: 11.5 mV
MDC IDC PG IMPLANT DT: 20160212
MDC IDC PG SERIAL: 3049931
MDC IDC SET LEADCHNL RA PACING AMPLITUDE: 1.5 V
MDC IDC STAT BRADY AP VP PERCENT: 50 %
MDC IDC STAT BRADY AS VP PERCENT: 49 %
MDC IDC STAT BRADY RA PERCENT PACED: 48 %
MDC IDC STAT BRADY RV PERCENT PACED: 99 %

## 2017-12-25 DIAGNOSIS — M791 Myalgia, unspecified site: Secondary | ICD-10-CM | POA: Diagnosis not present

## 2017-12-25 DIAGNOSIS — G20 Parkinson's disease: Secondary | ICD-10-CM | POA: Diagnosis not present

## 2017-12-25 DIAGNOSIS — I2510 Atherosclerotic heart disease of native coronary artery without angina pectoris: Secondary | ICD-10-CM | POA: Diagnosis not present

## 2017-12-25 DIAGNOSIS — I10 Essential (primary) hypertension: Secondary | ICD-10-CM | POA: Diagnosis not present

## 2017-12-25 DIAGNOSIS — R7303 Prediabetes: Secondary | ICD-10-CM | POA: Diagnosis not present

## 2017-12-26 ENCOUNTER — Other Ambulatory Visit: Payer: Self-pay | Admitting: Internal Medicine

## 2018-02-06 ENCOUNTER — Ambulatory Visit (INDEPENDENT_AMBULATORY_CARE_PROVIDER_SITE_OTHER): Payer: Medicare Other | Admitting: *Deleted

## 2018-02-06 ENCOUNTER — Telehealth: Payer: Self-pay | Admitting: Cardiology

## 2018-02-06 DIAGNOSIS — I442 Atrioventricular block, complete: Secondary | ICD-10-CM

## 2018-02-06 NOTE — Telephone Encounter (Signed)
LMOVM reminding pt to send remote transmission.   

## 2018-02-07 ENCOUNTER — Encounter: Payer: Self-pay | Admitting: Cardiology

## 2018-02-07 NOTE — Progress Notes (Signed)
Remote pacemaker transmission.   

## 2018-02-27 LAB — CUP PACEART REMOTE DEVICE CHECK
Battery Remaining Percentage: 95.5 %
Brady Statistic AP VP Percent: 53 %
Brady Statistic AP VS Percent: 1 %
Brady Statistic AS VP Percent: 46 %
Brady Statistic AS VS Percent: 1 %
Brady Statistic RA Percent Paced: 52 %
Brady Statistic RV Percent Paced: 99 %
Implantable Lead Implant Date: 20160212
Lead Channel Pacing Threshold Amplitude: 1.375 V
Lead Channel Pacing Threshold Pulse Width: 0.4 ms
Lead Channel Pacing Threshold Pulse Width: 0.4 ms
Lead Channel Sensing Intrinsic Amplitude: 12 mV
Lead Channel Setting Pacing Amplitude: 1.625
Lead Channel Setting Pacing Amplitude: 1.625
Lead Channel Setting Sensing Sensitivity: 4 mV
MDC IDC LEAD IMPLANT DT: 20160212
MDC IDC LEAD LOCATION: 753859
MDC IDC LEAD LOCATION: 753860
MDC IDC MSMT BATTERY REMAINING LONGEVITY: 125 mo
MDC IDC MSMT BATTERY VOLTAGE: 2.99 V
MDC IDC MSMT LEADCHNL RA IMPEDANCE VALUE: 480 Ohm
MDC IDC MSMT LEADCHNL RA PACING THRESHOLD AMPLITUDE: 0.625 V
MDC IDC MSMT LEADCHNL RA SENSING INTR AMPL: 2 mV
MDC IDC MSMT LEADCHNL RV IMPEDANCE VALUE: 540 Ohm
MDC IDC PG IMPLANT DT: 20160212
MDC IDC PG SERIAL: 3049931
MDC IDC SESS DTM: 20190430205615
MDC IDC SET LEADCHNL RV PACING PULSEWIDTH: 0.4 ms

## 2018-03-02 ENCOUNTER — Ambulatory Visit: Payer: BC Managed Care – PPO | Admitting: Family

## 2018-03-05 NOTE — Progress Notes (Signed)
Patient ID: Matthew Howe, male    DOB: 09-14-1946, 72 y.o.   MRN: [id number]  HPI This is a 72 y/o male with a history of parkinson's, HTN, hyperlipidemia, GERD, CAD, complete heart block (pacemaker), atrial fibrillation, AICD and chronic heart failure.   Echo done 12/08/16 reviewed and shows an EF of 55-60% along with mild AR/MR. Previous echo was done 07/28/15 and showed an EF of 55-60% along with mild AR. Had a cardiac catheterization done 11/20/12 and showed mild nonobstructive disease.  Has not been admitted or been in the ED in the last 6 months.   He presents today for a follow-up visit with a chief complaint of moderate fatigue upon minimal exertion. He describes this as chronic in nature having been present for several years with varying levels of severity. He has associated pedal edema, abdominal distention, bilateral hand edema, tremors and difficulty sleeping along with this. He denies any chest pain, palpitations, shortness of breath, dizziness or weight gain. Has had this hand edema for >5 days now. It started after he had done some heavy lifting at work but it hasn't gone away. No pain but he does describe stinging in his fingers due to the swelling.   Past Medical History:  Diagnosis Date  . AICD (automatic cardioverter/defibrillator) present   . Atrial fibrillation ([place])   . CHF (congestive heart failure) ([place])   . Complete heart block ([place])    a. s/p St. Jude PPM 11/2014.  [name] Coronary artery disease   . GERD (gastroesophageal reflux disease)   . Heart murmur   . Hyperlipidemia   . Hypertension   . Parkinson's disease ([place])   . Presence of permanent cardiac pacemaker   . Shortness of breath dyspnea    Past Surgical History:  Procedure Laterality Date  . APPENDECTOMY  12/2010  . CARDIAC CATHETERIZATION  11/20/2012   Known CAD with one vessel coronary disease of left descending artery by cardiac cath.   [name] CARDIOVERSION N/A 11/21/2016   Procedure: CARDIOVERSION;  Surgeon:  Deboraha Sprang, MD;  Location: [facility] ORS;  Service: Cardiovascular;  Laterality: N/A;  . COLONOSCOPY  2018  . INSERT / REPLACE / REMOVE PACEMAKER    . PACEMAKER INSERTION  11-21-14   STJ Assurity dual chamber pacemaker implanted by Dr Rayann Heman for CHB  . PERMANENT PACEMAKER INSERTION N/A 11/21/2014   Procedure: PERMANENT PACEMAKER INSERTION;  Surgeon: Thompson Grayer, MD;  Location: [facility] CATH LAB;  Service: Cardiovascular;  Laterality: N/A;  . TEE WITHOUT CARDIOVERSION N/A 11/21/2016   Procedure: TRANSESOPHAGEAL ECHOCARDIOGRAM (TEE);  Surgeon: Deboraha Sprang, MD;  Location: [facility] ORS;  Service: Cardiovascular;  Laterality: N/A;  . UPPER GI ENDOSCOPY     Family History  Problem Relation Age of Onset  . Heart disease Mother   . Heart disease Father   . Colon cancer Father   . Stroke Sister    Social History   Tobacco Use  . Smoking status: Never Smoker  . Smokeless tobacco: Never Used  Substance Use Topics  . Alcohol use: No    Alcohol/week: 0.0 oz   Allergies  Allergen Reactions  . Linzess [Linaclotide] Shortness Of Breath  . Amitiza [Lubiprostone] Other (See Comments)    Abdominal cramps  . Codeine Diarrhea and Nausea And Vomiting  . Norco [Hydrocodone-Acetaminophen] Diarrhea and Nausea And Vomiting  . Omeprazole Diarrhea and Nausea And Vomiting  . Requip  [Ropinirole] Nausea Only   Prior to Admission medications   Medication Sig Start Date End  Date Taking? Authorizing Provider  apixaban (ELIQUIS) 5 MG TABS tablet Take 1 tablet (5 mg total) by mouth 2 (two) times daily. 07/19/17  Yes Minna Merritts, MD  bisacodyl (BISACODYL) 5 MG EC tablet Take 5 mg by mouth daily as needed.    Yes [provider]  carvedilol (COREG) 12.5 MG tablet TAKE 1 TABLET (12.5 MG TOTAL) BY MOUTH 2 (TWO) TIMES DAILY. 05/18/17 03/07/18 Yes Gollan, Kathlene November, MD  dicyclomine (BENTYL) 20 MG tablet Take 20 mg by mouth 3 (three) times daily with meals.  04/04/13  Yes [provider]  fluticasone  (FLONASE) 50 MCG/ACT nasal spray Place 2 sprays into the nose daily as needed for allergies.    Yes [provider]  lansoprazole (PREVACID) 15 MG capsule Take 15 mg by mouth as needed (indigestion).    Yes [provider]  lisinopril (PRINIVIL,ZESTRIL) 40 MG tablet Take 1 tablet (40 mg total) by mouth daily. 11/20/17  Yes Hackney, Otila Kluver A, FNP  loratadine (CLARITIN) 10 MG tablet Take 10 mg by mouth daily as needed for allergies.   Yes [provider]  meloxicam (MOBIC) 15 MG tablet Take 15 mg by mouth daily as needed.    Yes [provider]  polyethylene glycol (MIRALAX / GLYCOLAX) packet Take 17 g by mouth daily as needed.    Yes [provider]  potassium chloride (KLOR-CON 10) 10 MEQ tablet Take 3 tablets (30 mEq total) by mouth 2 (two) times daily. Reported on 12/24/2015 10/13/17  Yes Gollan, Kathlene November, MD  pramipexole (MIRAPEX) 0.5 MG tablet Take 0.5 mg by mouth 3 (three) times daily.    Yes [provider]  selegiline (ELDEPRYL) 5 MG capsule Take 5 mg by mouth 2 (two) times daily with a meal.    Yes [provider]  torsemide (DEMADEX) 20 MG tablet Take 60 mg by mouth 2 (two) times daily.   Yes [provider]    Review of Systems  Constitutional: Positive for fatigue. Negative for appetite change.  HENT: Negative for congestion, rhinorrhea and sore throat.   Eyes: Negative.   Respiratory: Negative for chest tightness and shortness of breath.   Cardiovascular: Positive for leg swelling. Negative for chest pain and palpitations.  Gastrointestinal: Positive for abdominal distention. Negative for abdominal pain.  Endocrine: Negative.   Genitourinary: Negative.   Musculoskeletal: Positive for arthralgias (both hands swollen and stinging). Negative for back pain and neck pain.  Skin: Negative.   Allergic/Immunologic: Negative.   Neurological: Positive for tremors. Negative for dizziness and light-headedness.  Hematological:  Negative for adenopathy. Does not bruise/bleed easily.  Psychiatric/Behavioral: Positive for sleep disturbance (sleeping on 2 pillows; sometimes restless sleep). Negative for dysphoric mood. The patient is not nervous/anxious.    Vitals:   03/07/18 0857  BP: (!) 113/38  Pulse: 60  Resp: 18  SpO2: 95%  Weight: 255 lb 4 oz (115.8 kg)  Height: 6\' 1"  (1.854 m)   Wt Readings from Last 3 Encounters:  03/07/18 255 lb 4 oz (115.8 kg)  10/26/17 258 lb 6 oz (117.2 kg)  08/08/17 258 lb 4 oz (117.1 kg)   Lab Results  Component Value Date   CREATININE 1.38 (H) 10/17/2017   CREATININE 1.39 (H) 08/08/2017   CREATININE 1.36 (H) 11/15/2016    Physical Exam  Constitutional: He is oriented to person, place, and time. He appears well-developed and well-nourished.  HENT:  Head: Normocephalic and atraumatic.  Eyes: Pupils are equal, round, and reactive to light.  Conjunctivae are normal.  Neck: Normal range of motion. Neck supple. No JVD present.  Cardiovascular: Normal rate and regular rhythm.  Pulmonary/Chest: Effort normal. He has no wheezes. He has no rales.  Abdominal: Soft. He exhibits distension. There is no tenderness.  Genitourinary:  Genitourinary Comments: + radial pulses bilaterally  Musculoskeletal: He exhibits edema (1+ edema with R>L). He exhibits no tenderness.  Both hands are swollen over the dorsum of the hands and all the fingers are also swollen.   Neurological: He is alert and oriented to person, place, and time.  Skin: Skin is warm and dry.  Psychiatric: He has a normal mood and affect. His behavior is normal. Thought content normal.  Nursing note and vitals reviewed.  Assessment & Plan:  1: Chronic heart failure with preserved ejection fraction- - NYHA class III - mildly fluid overloaded today; taking 60mg  torsemide BID  - weighing daily; Reminded to call for an overnight weight gain of >2 pounds or a weekly weight gain of >5 pounds - weight down 3 pounds since he was  last here January - not adding salt to his food and wife is diligent about reading food labels - saw cardiologist Rockey Situ) 03/21/17 and returns in 1 year - PharmD reconciled medications with the patient  2: HTN- - BP on the low side today; they are to monitor it at home and call if it remains low; no dizziness - saw PCP Ola Spurr) 12/25/17 - BMP from 12/25/17 reviewed and showed sodium 142, potassium 4.6 and GFR 55.  3: Atrial fibrillation- - cardioversion done 11/21/16 and so far, he has remained in NSR. Reports feeling better since the cardioversion was done.   4: Parkinson's Disease- - continues to have tremors - saw neurologist ([place])10/31/17  5: Hand edema- - most likely this is from overuse/ ? Arthritis - will give metolazone 2.5mg  daily for 2 days to see if this can provide some relief in edema/ stinging sensation that he has - he is to take an additional potassium tablet daily for today and tomorrow as well - encouraged him to elevate his hands at ~ 45 degree angle as well as apply ice especially after he works  Medication list was reviewed.  Return in 4 months or sooner for any questions/problems before then.

## 2018-03-07 ENCOUNTER — Encounter: Payer: Self-pay | Admitting: Family

## 2018-03-07 ENCOUNTER — Ambulatory Visit: Payer: Medicare Other | Attending: Family | Admitting: Family

## 2018-03-07 VITALS — BP 113/38 | HR 60 | Resp 18 | Ht 73.0 in | Wt 255.2 lb

## 2018-03-07 DIAGNOSIS — I10 Essential (primary) hypertension: Secondary | ICD-10-CM

## 2018-03-07 DIAGNOSIS — E785 Hyperlipidemia, unspecified: Secondary | ICD-10-CM | POA: Diagnosis not present

## 2018-03-07 DIAGNOSIS — R609 Edema, unspecified: Secondary | ICD-10-CM | POA: Diagnosis not present

## 2018-03-07 DIAGNOSIS — Z885 Allergy status to narcotic agent status: Secondary | ICD-10-CM | POA: Diagnosis not present

## 2018-03-07 DIAGNOSIS — I2510 Atherosclerotic heart disease of native coronary artery without angina pectoris: Secondary | ICD-10-CM | POA: Insufficient documentation

## 2018-03-07 DIAGNOSIS — Z7951 Long term (current) use of inhaled steroids: Secondary | ICD-10-CM | POA: Diagnosis not present

## 2018-03-07 DIAGNOSIS — Z79899 Other long term (current) drug therapy: Secondary | ICD-10-CM | POA: Insufficient documentation

## 2018-03-07 DIAGNOSIS — I4891 Unspecified atrial fibrillation: Secondary | ICD-10-CM | POA: Insufficient documentation

## 2018-03-07 DIAGNOSIS — Z8249 Family history of ischemic heart disease and other diseases of the circulatory system: Secondary | ICD-10-CM | POA: Diagnosis not present

## 2018-03-07 DIAGNOSIS — Z7901 Long term (current) use of anticoagulants: Secondary | ICD-10-CM | POA: Diagnosis not present

## 2018-03-07 DIAGNOSIS — I5032 Chronic diastolic (congestive) heart failure: Secondary | ICD-10-CM | POA: Insufficient documentation

## 2018-03-07 DIAGNOSIS — K219 Gastro-esophageal reflux disease without esophagitis: Secondary | ICD-10-CM | POA: Diagnosis not present

## 2018-03-07 DIAGNOSIS — I509 Heart failure, unspecified: Secondary | ICD-10-CM | POA: Diagnosis present

## 2018-03-07 DIAGNOSIS — Z791 Long term (current) use of non-steroidal anti-inflammatories (NSAID): Secondary | ICD-10-CM | POA: Diagnosis not present

## 2018-03-07 DIAGNOSIS — G20 Parkinson's disease: Secondary | ICD-10-CM | POA: Insufficient documentation

## 2018-03-07 DIAGNOSIS — Z800 Family history of malignant neoplasm of digestive organs: Secondary | ICD-10-CM | POA: Diagnosis not present

## 2018-03-07 DIAGNOSIS — Z95810 Presence of automatic (implantable) cardiac defibrillator: Secondary | ICD-10-CM | POA: Insufficient documentation

## 2018-03-07 DIAGNOSIS — Z888 Allergy status to other drugs, medicaments and biological substances status: Secondary | ICD-10-CM | POA: Diagnosis not present

## 2018-03-07 DIAGNOSIS — Z823 Family history of stroke: Secondary | ICD-10-CM | POA: Diagnosis not present

## 2018-03-07 DIAGNOSIS — I442 Atrioventricular block, complete: Secondary | ICD-10-CM | POA: Insufficient documentation

## 2018-03-07 DIAGNOSIS — R600 Localized edema: Secondary | ICD-10-CM

## 2018-03-07 DIAGNOSIS — I110 Hypertensive heart disease with heart failure: Secondary | ICD-10-CM | POA: Diagnosis not present

## 2018-03-07 DIAGNOSIS — I480 Paroxysmal atrial fibrillation: Secondary | ICD-10-CM

## 2018-03-07 MED ORDER — METOLAZONE 2.5 MG PO TABS: 2.5000 mg | ORAL_TABLET | Freq: Every day | ORAL | 0 refills | Status: DC

## 2018-03-07 NOTE — Patient Instructions (Addendum)
Continue weighing daily and call for an overnight weight gain of > 2 pounds or a weekly weight gain of >5 pounds.  Take metolazone (booster fluid pill) daily for the next 2 days. Best to take it 1/2 hour prior to torsemide.  For these next 2 days, take an additional potassium as well.

## 2018-03-29 ENCOUNTER — Ambulatory Visit (INDEPENDENT_AMBULATORY_CARE_PROVIDER_SITE_OTHER): Payer: Medicare Other | Admitting: Cardiovascular Disease

## 2018-03-29 ENCOUNTER — Encounter: Payer: Self-pay | Admitting: Cardiovascular Disease

## 2018-03-29 VITALS — BP 128/58 | HR 60 | Ht 73.0 in | Wt 251.2 lb

## 2018-03-29 DIAGNOSIS — I480 Paroxysmal atrial fibrillation: Secondary | ICD-10-CM

## 2018-03-29 DIAGNOSIS — M25541 Pain in joints of right hand: Secondary | ICD-10-CM | POA: Diagnosis not present

## 2018-03-29 DIAGNOSIS — I2510 Atherosclerotic heart disease of native coronary artery without angina pectoris: Secondary | ICD-10-CM

## 2018-03-29 DIAGNOSIS — M25542 Pain in joints of left hand: Secondary | ICD-10-CM

## 2018-03-29 DIAGNOSIS — M2550 Pain in unspecified joint: Secondary | ICD-10-CM | POA: Insufficient documentation

## 2018-03-29 DIAGNOSIS — R600 Localized edema: Secondary | ICD-10-CM

## 2018-03-29 DIAGNOSIS — I5032 Chronic diastolic (congestive) heart failure: Secondary | ICD-10-CM

## 2018-03-29 MED ORDER — METOLAZONE 2.5 MG PO TABS: 2.5000 mg | ORAL_TABLET | Freq: Every day | ORAL | 3 refills | Status: DC | PRN

## 2018-03-29 NOTE — Progress Notes (Signed)
Cardiology Office Note  Date:  03/29/2018   ID:  Matthew Howe, Matthew Howe 09-27-1946, MRN [id number]  PCP:  Baxter Hire, MD   Chief Complaint  Patient presents with  . Other    12 month follow up. Patient c/o  swelling in the hands and finger tips burning. Meds reviewed verbally with patient.     HPI:  Matthew Howe is a very pleasant 71 year old gentleman with diagnosis of  Parkinson's who currently works delivering car parts,  Hypertension, cardiac catheterization showing mild CAD , February 2014  EF 55% hyperlipidemia,  s/p pacemaker placement.complete heart block.  Found to have atrial fibrillation 11/22/2014 on pacemaker download, started on anticoagulation   hospitalization for paroxysmal atrial fibrillation/acute on chronic diastolic CHF He presents today for weight gain, shortness of breath, leg edema, chronic diastolic CHF Paroxysmal atrial fibrillation  120-140/50 to 60 Rate 60s, Weight stable Leg and hand swelling, in the joints Took metolazone x 2 days, no change Weight at home: 248 - 250 Weight down from 262 in 03/2017  Last pacemaker download reviewed with him <1% of atrial fib No edema  EKG personally reviewed by myself on todays visit shows paced rhythm 60 bpm  Other past medical history  presented to the hospital 07/28/2015 with abdominal bloating, shortness of breath Was treated with diureticswith improvement of his symptoms Encouraged to take Lasix daily  leads a busy lifestyle, continues to work most days of the week. Fatigue, tremor Followed by neurology, Dr. Melrose Nakayama for Parkinson's Previous issues with constipation  He presented to [facility] with bradycardia February 2016. This seemed to improve without intervention and he is asymptomatic. He had recurrent symptoms and we referred him to [facility] for further evaluation. He was seen in the emergency room and found to be in heart block, pacemaker placed urgently   Previous Stress echo leading to  cardiac catheterization at [facility].  Stress echo was a limited study, ejection fraction estimated at 45% with EF of 50% at peak stress.  Cardiac catheterization 11/20/2012 with 25% proximal LAD disease, followed by 40% lesion. The cardiac catheterization note makes mention of atrial flutter though arrhythmia is not mentioned in any of the clinic notes.  Previous Echocardiogram shows ejection fraction 50-55%, mild to moderate aortic valve insufficiency, otherwise normal study.  Abdominal CT 12/22/2010 showed acute appendicitis, colonic diverticulosis    PMH:   has a past medical history of AICD (automatic cardioverter/defibrillator) present, Atrial fibrillation ([place]), CHF (congestive heart failure) ([place]), Complete heart block ([place]), Coronary artery disease, GERD (gastroesophageal reflux disease), Heart murmur, Hyperlipidemia, Hypertension, Parkinson's disease ([place]), Presence of permanent cardiac pacemaker, and Shortness of breath dyspnea.  PSH:    Past Surgical History:  Procedure Laterality Date  . APPENDECTOMY  12/2010  . CARDIAC CATHETERIZATION  11/20/2012   Known CAD with one vessel coronary disease of left descending artery by cardiac cath.   [name] CARDIOVERSION N/A 11/21/2016   Procedure: CARDIOVERSION;  Surgeon: Deboraha Sprang, MD;  Location: [facility] ORS;  Service: Cardiovascular;  Laterality: N/A;  . COLONOSCOPY  2018  . INSERT / REPLACE / REMOVE PACEMAKER    . PACEMAKER INSERTION  11-21-14   STJ Assurity dual chamber pacemaker implanted by Dr Rayann Heman for CHB  . PERMANENT PACEMAKER INSERTION N/A 11/21/2014   Procedure: PERMANENT PACEMAKER INSERTION;  Surgeon: Thompson Grayer, MD;  Location: [facility] CATH LAB;  Service: Cardiovascular;  Laterality: N/A;  . TEE WITHOUT CARDIOVERSION N/A 11/21/2016   Procedure: TRANSESOPHAGEAL ECHOCARDIOGRAM (TEE);  Surgeon: Deboraha Sprang, MD;  Location: [facility] ORS;  Service: Cardiovascular;  Laterality: N/A;  . UPPER GI ENDOSCOPY      Current Outpatient  Medications  Medication Sig Dispense Refill  . apixaban (ELIQUIS) 5 MG TABS tablet Take 1 tablet (5 mg total) by mouth 2 (two) times daily. 180 tablet 2  . bisacodyl (BISACODYL) 5 MG EC tablet Take 5 mg by mouth daily as needed.     . dicyclomine (BENTYL) 20 MG tablet Take 20 mg by mouth 3 (three) times daily with meals.     . fluticasone (FLONASE) 50 MCG/ACT nasal spray Place 2 sprays into the nose daily as needed for allergies.     [name] lansoprazole (PREVACID) 15 MG capsule Take 15 mg by mouth as needed (indigestion).     [name] lisinopril (PRINIVIL,ZESTRIL) 40 MG tablet Take 1 tablet (40 mg total) by mouth daily. 90 tablet 3  . loratadine (CLARITIN) 10 MG tablet Take 10 mg by mouth daily as needed for allergies.     . meloxicam (MOBIC) 15 MG tablet Take 15 mg by mouth daily as needed.     . polyethylene glycol (MIRALAX / GLYCOLAX) packet Take 17 g by mouth daily as needed.     . potassium chloride (KLOR-CON 10) 10 MEQ tablet Take 3 tablets (30 mEq total) by mouth 2 (two) times daily. Reported on 12/24/2015 180 tablet 6  . pramipexole (MIRAPEX) 0.5 MG tablet Take 0.5 mg by mouth 3 (three) times daily.     . selegiline (ELDEPRYL) 5 MG capsule Take 5 mg by mouth 2 (two) times daily with a meal.     . torsemide (DEMADEX) 20 MG tablet Take 60 mg by mouth 2 (two) times daily.    . carvedilol (COREG) 12.5 MG tablet TAKE 1 TABLET (12.5 MG TOTAL) BY MOUTH 2 (TWO) TIMES DAILY. 180 tablet 3   No current facility-administered medications for this visit.      Allergies:   Linzess [linaclotide]; Amitiza [lubiprostone]; Codeine; Norco [hydrocodone-acetaminophen]; Omeprazole; and Requip  [ropinirole]   Social History:  The patient  reports that he has never smoked. He has never used smokeless tobacco. He reports that he does not drink alcohol or use drugs.   Family History:   family history includes Colon cancer in his father; Heart disease in his father and mother; Stroke in his sister.    Review of  Systems: Review of Systems  Constitutional: Positive for weight loss.  Respiratory: Negative.   Cardiovascular: Negative.   Gastrointestinal: Negative.   Musculoskeletal: Positive for joint pain.       Swelling of his joints in the hands wrist  Neurological: Negative.   Psychiatric/Behavioral: Negative.   All other systems reviewed and are negative.    PHYSICAL EXAM: VS:  BP (!) 128/58 (BP Location: Left Arm, Patient Position: Sitting, Cuff Size: Normal)   Pulse 60   Ht 6\' 1"  (1.854 m)   Wt 251 lb 4 oz (114 kg)   BMI 33.15 kg/m  , BMI Body mass index is 33.15 kg/m. Constitutional:  oriented to person, place, and time. No distress.  HENT:  Head: Normocephalic and atraumatic.  Eyes:  no discharge. No scleral icterus.  Neck: Normal range of motion. Neck supple. No JVD present.  Cardiovascular: Normal rate, regular rhythm, normal heart sounds and intact distal pulses. Exam reveals no gallop and no friction rub. No edema No murmur heard. Pulmonary/Chest: Effort normal and breath sounds normal. No stridor. No respiratory distress.  no wheezes.  no rales.  no  tenderness.  Abdominal: Soft.  no distension.  no tenderness.  Musculoskeletal: Normal range of motion.  no  tenderness or deformity.  Neurological:  normal muscle tone. Coordination normal. No atrophy Skin: Skin is warm and dry. No rash noted. not diaphoretic.  Psychiatric:  normal mood and affect. behavior is normal. Thought content normal.   Recent Labs: 08/08/2017: Hemoglobin 13.3; Platelets 288 10/17/2017: BUN 34; Creatinine, Ser 1.38; Potassium 4.5; Sodium 139    Lipid Panel Lab Results  Component Value Date   CHOL 188 03/23/2016   HDL 46 03/23/2016   LDLCALC 114 (H) 03/23/2016   TRIG 138 03/23/2016      Wt Readings from Last 3 Encounters:  03/29/18 251 lb 4 oz (114 kg)  03/07/18 255 lb 4 oz (115.8 kg)  10/26/17 258 lb 6 oz (117.2 kg)       ASSESSMENT AND PLAN:  Essential hypertension - Plan: EKG  16-XWRU, Basic Metabolic Panel (BMET) Blood pressure is well controlled on today's visit. No changes made to the medications. stable  Paroxysmal atrial fibrillation (HCC) - Low burden of atrial fibrillation on recent pacemaker download less than 1% No medication changes made  Coronary artery disease involving native coronary artery of native heart without angina pectoris Currently with no symptoms of angina. No further workup at this time. Continue current medication regimen.stable  Complete heart block ([place]) Pacemaker in place, followed by Dr. 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 Melrose Nakayama  Morbid obesity ([place]) Weight is down 10 pounds from clinic visit one year ago  Shortness of breath Likely secondary to weight gain, deconditioning Still very active at baseline   Total encounter time more than 25 minutes  Greater than 50% was spent in counseling and coordination of care with the patient   Disposition:   F/U  12 months   Orders Placed This Encounter  Procedures  . EKG 12-Lead     Signed, Esmond Plants, M.D., Ph.D. 03/29/2018  [place], [place]

## 2018-03-29 NOTE — Patient Instructions (Addendum)
We will place a referral for rheumatology Dr. Uvaldo Bristle [phone] Please call soon to get appointment.  Medication Instructions:   No medication changes made  Please take metolazone 2.5 mg as needed for weight >255, shortness of breath, leg swelling  Labwork:  No new labs needed  Testing/Procedures:  No further testing at this time   Follow-Up: It was a pleasure seeing you in the office today. Please call us if you have new issues that need to be addressed before your next appt.  [phone]  Your physician wants you to follow-up in: 12 months.  You will receive a reminder letter in the mail two months in advance. If you don't receive a letter, please call our office to schedule the follow-up appointment.  If you need a refill on your cardiac medications before your next appointment, please call your pharmacy.  For educational health videos Log in to : [email or web address] Or : [email or web address], password : [email or web address]

## 2018-04-03 DIAGNOSIS — M19041 Primary osteoarthritis, right hand: Secondary | ICD-10-CM | POA: Diagnosis not present

## 2018-04-03 DIAGNOSIS — M19032 Primary osteoarthritis, left wrist: Secondary | ICD-10-CM | POA: Diagnosis not present

## 2018-04-03 DIAGNOSIS — G20 Parkinson's disease: Secondary | ICD-10-CM | POA: Diagnosis not present

## 2018-04-03 DIAGNOSIS — I442 Atrioventricular block, complete: Secondary | ICD-10-CM | POA: Diagnosis not present

## 2018-04-03 DIAGNOSIS — I5032 Chronic diastolic (congestive) heart failure: Secondary | ICD-10-CM | POA: Diagnosis not present

## 2018-04-03 DIAGNOSIS — M1990 Unspecified osteoarthritis, unspecified site: Secondary | ICD-10-CM | POA: Diagnosis not present

## 2018-04-03 DIAGNOSIS — M19031 Primary osteoarthritis, right wrist: Secondary | ICD-10-CM | POA: Diagnosis not present

## 2018-04-03 DIAGNOSIS — M19042 Primary osteoarthritis, left hand: Secondary | ICD-10-CM | POA: Diagnosis not present

## 2018-04-03 DIAGNOSIS — I480 Paroxysmal atrial fibrillation: Secondary | ICD-10-CM | POA: Diagnosis not present

## 2018-04-16 ENCOUNTER — Other Ambulatory Visit: Payer: Self-pay | Admitting: Cardiovascular Disease

## 2018-04-16 DIAGNOSIS — M1A9XX1 Chronic gout, unspecified, with tophus (tophi): Secondary | ICD-10-CM | POA: Diagnosis not present

## 2018-04-16 DIAGNOSIS — M1990 Unspecified osteoarthritis, unspecified site: Secondary | ICD-10-CM | POA: Diagnosis not present

## 2018-04-16 DIAGNOSIS — R202 Paresthesia of skin: Secondary | ICD-10-CM | POA: Diagnosis not present

## 2018-04-16 DIAGNOSIS — G20 Parkinson's disease: Secondary | ICD-10-CM | POA: Diagnosis not present

## 2018-04-16 DIAGNOSIS — R200 Anesthesia of skin: Secondary | ICD-10-CM | POA: Diagnosis not present

## 2018-04-16 DIAGNOSIS — N183 Chronic kidney disease, stage 3 (moderate): Secondary | ICD-10-CM | POA: Diagnosis not present

## 2018-04-16 DIAGNOSIS — I5032 Chronic diastolic (congestive) heart failure: Secondary | ICD-10-CM | POA: Diagnosis not present

## 2018-04-17 DIAGNOSIS — M6289 Other specified disorders of muscle: Secondary | ICD-10-CM | POA: Diagnosis not present

## 2018-04-17 DIAGNOSIS — G20 Parkinson's disease: Secondary | ICD-10-CM | POA: Diagnosis not present

## 2018-04-17 DIAGNOSIS — M791 Myalgia, unspecified site: Secondary | ICD-10-CM | POA: Diagnosis not present

## 2018-05-04 ENCOUNTER — Other Ambulatory Visit: Payer: Self-pay | Admitting: Cardiovascular Disease

## 2018-05-04 NOTE — Telephone Encounter (Signed)
Please review for refill, Thanks !  

## 2018-05-08 ENCOUNTER — Ambulatory Visit (INDEPENDENT_AMBULATORY_CARE_PROVIDER_SITE_OTHER): Payer: Medicare Other | Admitting: *Deleted

## 2018-05-08 DIAGNOSIS — I442 Atrioventricular block, complete: Secondary | ICD-10-CM | POA: Diagnosis not present

## 2018-05-08 NOTE — Progress Notes (Signed)
Remote pacemaker transmission.   

## 2018-05-09 ENCOUNTER — Other Ambulatory Visit: Payer: Self-pay | Admitting: Cardiovascular Disease

## 2018-05-09 ENCOUNTER — Encounter: Payer: Self-pay | Admitting: Cardiology

## 2018-05-10 ENCOUNTER — Other Ambulatory Visit: Payer: Self-pay | Admitting: Cardiovascular Disease

## 2018-05-10 NOTE — Telephone Encounter (Signed)
Please review for refill, Thanks !  

## 2018-05-11 NOTE — Telephone Encounter (Signed)
This was last filled by Dr. Darnell Level. Please send to him for advisement. Thank you

## 2018-05-14 ENCOUNTER — Other Ambulatory Visit: Payer: Self-pay | Admitting: Family

## 2018-05-14 MED ORDER — TORSEMIDE 20 MG PO TABS: 60.0000 mg | ORAL_TABLET | Freq: Two times a day (BID) | ORAL | 5 refills | Status: DC

## 2018-05-28 DIAGNOSIS — N183 Chronic kidney disease, stage 3 (moderate): Secondary | ICD-10-CM | POA: Diagnosis not present

## 2018-05-28 DIAGNOSIS — R202 Paresthesia of skin: Secondary | ICD-10-CM | POA: Diagnosis not present

## 2018-05-28 DIAGNOSIS — M1A9XX1 Chronic gout, unspecified, with tophus (tophi): Secondary | ICD-10-CM | POA: Diagnosis not present

## 2018-05-28 DIAGNOSIS — M1990 Unspecified osteoarthritis, unspecified site: Secondary | ICD-10-CM | POA: Diagnosis not present

## 2018-05-28 DIAGNOSIS — R200 Anesthesia of skin: Secondary | ICD-10-CM | POA: Diagnosis not present

## 2018-05-29 ENCOUNTER — Ambulatory Visit (INDEPENDENT_AMBULATORY_CARE_PROVIDER_SITE_OTHER): Payer: Medicare Other | Admitting: Internal Medicine

## 2018-05-29 VITALS — BP 120/60 | HR 60 | Ht 73.0 in | Wt 259.2 lb

## 2018-05-29 DIAGNOSIS — I480 Paroxysmal atrial fibrillation: Secondary | ICD-10-CM

## 2018-05-29 DIAGNOSIS — Z950 Presence of cardiac pacemaker: Secondary | ICD-10-CM | POA: Diagnosis not present

## 2018-05-29 DIAGNOSIS — I442 Atrioventricular block, complete: Secondary | ICD-10-CM | POA: Diagnosis not present

## 2018-05-29 DIAGNOSIS — I2510 Atherosclerotic heart disease of native coronary artery without angina pectoris: Secondary | ICD-10-CM | POA: Diagnosis not present

## 2018-05-29 LAB — CUP PACEART INCLINIC DEVICE CHECK
Battery Voltage: 2.99 V
Brady Statistic RA Percent Paced: 49 %
Date Time Interrogation Session: 20190820135310
Implantable Lead Implant Date: 20160212
Implantable Lead Location: 753860
Lead Channel Impedance Value: 537.5 Ohm
Lead Channel Impedance Value: 562.5 Ohm
Lead Channel Pacing Threshold Amplitude: 0.5 V
Lead Channel Pacing Threshold Pulse Width: 0.4 ms
Lead Channel Setting Pacing Amplitude: 1.375
Lead Channel Setting Pacing Amplitude: 1.5 V
MDC IDC LEAD IMPLANT DT: 20160212
MDC IDC LEAD LOCATION: 753859
MDC IDC MSMT BATTERY REMAINING LONGEVITY: 124 mo
MDC IDC MSMT LEADCHNL RA SENSING INTR AMPL: 4.4 mV
MDC IDC MSMT LEADCHNL RV PACING THRESHOLD AMPLITUDE: 1.125 V
MDC IDC MSMT LEADCHNL RV PACING THRESHOLD PULSEWIDTH: 0.4 ms
MDC IDC PG IMPLANT DT: 20160212
MDC IDC SET LEADCHNL RV PACING PULSEWIDTH: 0.4 ms
MDC IDC SET LEADCHNL RV SENSING SENSITIVITY: 4 mV
MDC IDC STAT BRADY RV PERCENT PACED: 99.5 %
Pulse Gen Model: 2240
Pulse Gen Serial Number: 3049931

## 2018-05-29 NOTE — Patient Instructions (Addendum)
Medication Instructions: - Your physician recommends that you continue on your current medications as directed. Please refer to the Current Medication list given to you today.  Labwork: - Your physician recommends that you have lab work today: CBC  Procedures/Testing: - none ordered  Follow-Up: - Remote monitoring is used to monitor your Pacemaker of ICD from home. This monitoring reduces the number of office visits required to check your device to one time per year. It allows [place] to keep an eye on the functioning of your device to ensure it is working properly. You are scheduled for a device check from home on 08/07/18. You may send your transmission at any time that day. If you have a wireless device, the transmission will be sent automatically. After your physician reviews your transmission, you will receive a postcard with your next transmission date.  - Your physician wants you to follow-up in: 9 months with Dr. Caryl Comes. You will receive a reminder letter/ call two months in advance. If you don't receive a letter/ call, please call our office to schedule the follow-up appointment.   Any Additional Special Instructions Will Be Listed Below (If Applicable).     If you need a refill on your cardiac medications before your next appointment, please call your pharmacy.

## 2018-05-29 NOTE — Progress Notes (Signed)
Patient Care Team: Baxter Hire, MD as PCP - General (Internal Medicine) Alisa Graff, FNP as Nurse Practitioner (Cardiology) Minna Merritts, MD as Consulting Physician (Cardiology) Anabel Bene, MD as Referring Physician (Neurology)   HPI  Matthew Howe is a 72 y.o. male Seen following pacemaker implantation 2/16 by Dr. Greggory Brandy . He had presented to [facility] with chest pain and shortness of breath. He was found to be in complete heart block. He had an antecedent history of bifascicular block    Persistence of atrial fibrillation assoc with SOB Cardioversion was accomplished.     He has had no intermittent atrial fibrillation;    Has not tolerated zetia--previoulsy did not tolerate statins   No chest pain.  Stable shortness of breath.  No edema.  Tremor is getting worse.  Also with swelling in his hands attributed both recently to gout'  Quite fatigued    Catheterization 2/14 had demonstrated mild nonobstructive disease. DATE TEST    10/16    echo   EF 55-60 %   3/18    Echo   EF 55-60 %         Date Cr K Hgb  2/18 1.36  14.7  1/19  0.38 4.5 13.3  3/19   12.4         Past Medical History:  Diagnosis Date  . AICD (automatic cardioverter/defibrillator) present   . Atrial fibrillation ([place])   . CHF (congestive heart failure) ([place])   . Complete heart block ([place])    a. s/p St. Jude PPM 11/2014.  [name] Coronary artery disease   . GERD (gastroesophageal reflux disease)   . Heart murmur   . Hyperlipidemia   . Hypertension   . Parkinson's disease ([place])   . Presence of permanent cardiac pacemaker   . Shortness of breath dyspnea     Past Surgical History:  Procedure Laterality Date  . APPENDECTOMY  12/2010  . CARDIAC CATHETERIZATION  11/20/2012   Known CAD with one vessel coronary disease of left descending artery by cardiac cath.   [name] CARDIOVERSION N/A 11/21/2016   Procedure: CARDIOVERSION;  Surgeon: Deboraha Sprang, MD;  Location: [facility] ORS;  Service:  Cardiovascular;  Laterality: N/A;  . COLONOSCOPY  2018  . INSERT / REPLACE / REMOVE PACEMAKER    . PACEMAKER INSERTION  11-21-14   STJ Assurity dual chamber pacemaker implanted by Dr Rayann Heman for CHB  . PERMANENT PACEMAKER INSERTION N/A 11/21/2014   Procedure: PERMANENT PACEMAKER INSERTION;  Surgeon: Thompson Grayer, MD;  Location: [facility] CATH LAB;  Service: Cardiovascular;  Laterality: N/A;  . TEE WITHOUT CARDIOVERSION N/A 11/21/2016   Procedure: TRANSESOPHAGEAL ECHOCARDIOGRAM (TEE);  Surgeon: Deboraha Sprang, MD;  Location: [facility] ORS;  Service: Cardiovascular;  Laterality: N/A;  . UPPER GI ENDOSCOPY      Current Outpatient Medications  Medication Sig Dispense Refill  . allopurinol (ZYLOPRIM) 100 MG tablet Take 200 mg by mouth daily.     . bisacodyl (BISACODYL) 5 MG EC tablet Take 5 mg by mouth daily as needed.     . carvedilol (COREG) 12.5 MG tablet TAKE 1 TABLET (12.5 MG TOTAL) BY MOUTH 2 (TWO) TIMES DAILY. 180 tablet 3  . dicyclomine (BENTYL) 20 MG tablet Take 20 mg by mouth 3 (three) times daily with meals.     [name] ELIQUIS 5 MG TABS tablet TAKE 1 TABLET BY MOUTH TWICE A DAY 180 tablet 2  . fluticasone (FLONASE) 50 MCG/ACT nasal spray Place  2 sprays into the nose daily as needed for allergies.     [name] lansoprazole (PREVACID) 15 MG capsule Take 15 mg by mouth as needed (indigestion).     [name] lisinopril (PRINIVIL,ZESTRIL) 40 MG tablet Take 1 tablet (40 mg total) by mouth daily. 90 tablet 3  . loratadine (CLARITIN) 10 MG tablet Take 10 mg by mouth daily as needed for allergies.     . meloxicam (MOBIC) 15 MG tablet Take 15 mg by mouth daily as needed.     . metolazone (ZAROXOLYN) 2.5 MG tablet Take 1 tablet (2.5 mg total) by mouth daily as needed (Once daily as needed for weight gain or swelling.). 30 tablet 3  . polyethylene glycol (MIRALAX / GLYCOLAX) packet Take 17 g by mouth daily as needed.     . potassium chloride (K-DUR) 10 MEQ tablet TAKE 3 TABLETS BY MOUTH TWICE A DAY 180 tablet 3  . pramipexole  (MIRAPEX) 0.5 MG tablet Take 0.5 mg by mouth 3 (three) times daily.     . predniSONE (DELTASONE) 5 MG tablet 1 TAB DAILY, 30 DAYS    . selegiline (ELDEPRYL) 5 MG capsule Take 5 mg by mouth 2 (two) times daily with a meal.     . torsemide (DEMADEX) 20 MG tablet TAKE 3 TABLETS (60 MG TOTAL) BY MOUTH 2 (TWO) TIMES DAILY. 180 tablet 6   No current facility-administered medications for this visit.     Allergies  Allergen Reactions  . Linzess [Linaclotide] Shortness Of Breath  . Amitiza [Lubiprostone] Other (See Comments)    Abdominal cramps  . Codeine Diarrhea and Nausea And Vomiting  . Norco [Hydrocodone-Acetaminophen] Diarrhea and Nausea And Vomiting  . Omeprazole Diarrhea and Nausea And Vomiting  . Requip  [Ropinirole] Nausea Only    Review of Systems negative except from HPI and PMH  Physical Exam BP 120/60 (BP Location: Left Arm, Patient Position: Sitting, Cuff Size: Large)   Pulse 60   Ht 6\' 1"  (1.854 m)   Wt 259 lb 4 oz (117.6 kg)   BMI 34.20 kg/m  Well developed and nourished in no acute distress HENT normal Neck supple with JVP-flat Clear Regular rate and rhythm, no murmurs or gallops Abd-soft with active BS No Clubbing cyanosis edema Skin-warm and dry A & Oriented  Grossly normal sensory and motor function   ECG demonstrates  Sinus P-synchronous/ AV  pacing     Assessment and  Plan  Complete heart block  Pacemaker-St. Jude The patient's device was interrogated.  The information was reviewed. No changes were made in the programming.     Parkinson's disease  Hypertension  Hypertensive heart disease   HFpEF  Atrial fibrillation-persistent    Interval afib of brief duration < 12 h Spontaneous reversion  On Anticoagulation;  No bleeding issues but Hgb has drifted down As above  Will check Hgb  BP well controlled  Poor heat tolerance, may be related to parkinson autonomic disease   We spent more than 50% of our >25 min visit in face to face  counseling regarding the above

## 2018-05-30 LAB — CBC WITH DIFFERENTIAL/PLATELET
BASOS: 1 %
Basophils Absolute: 0 10*3/uL (ref 0.0–0.2)
EOS (ABSOLUTE): 0.2 10*3/uL (ref 0.0–0.4)
EOS: 3 %
HEMATOCRIT: 36.8 % — AB (ref 37.5–51.0)
Hemoglobin: 12.1 g/dL — ABNORMAL LOW (ref 13.0–17.7)
IMMATURE GRANULOCYTES: 1 %
Immature Grans (Abs): 0.1 10*3/uL (ref 0.0–0.1)
Lymphocytes Absolute: 0.9 10*3/uL (ref 0.7–3.1)
Lymphs: 11 %
MCH: 28.7 pg (ref 26.6–33.0)
MCHC: 32.9 g/dL (ref 31.5–35.7)
MCV: 87 fL (ref 79–97)
MONOS ABS: 0.4 10*3/uL (ref 0.1–0.9)
Monocytes: 5 %
NEUTROS ABS: 6.5 10*3/uL (ref 1.4–7.0)
NEUTROS PCT: 79 %
PLATELETS: 303 10*3/uL (ref 150–450)
RBC: 4.22 x10E6/uL (ref 4.14–5.80)
RDW: 18.4 % — AB (ref 12.3–15.4)
WBC: 8.2 10*3/uL (ref 3.4–10.8)

## 2018-05-30 NOTE — Addendum Note (Signed)
Addended by: Britt Bottom on: 05/30/2018 01:14 PM   Modules accepted: Orders

## 2018-06-13 DIAGNOSIS — R29898 Other symptoms and signs involving the musculoskeletal system: Secondary | ICD-10-CM | POA: Diagnosis not present

## 2018-06-13 DIAGNOSIS — M7989 Other specified soft tissue disorders: Secondary | ICD-10-CM | POA: Diagnosis not present

## 2018-06-13 DIAGNOSIS — R208 Other disturbances of skin sensation: Secondary | ICD-10-CM | POA: Diagnosis not present

## 2018-06-20 LAB — CUP PACEART REMOTE DEVICE CHECK
Battery Remaining Longevity: 124 mo
Brady Statistic AP VP Percent: 51 %
Brady Statistic AP VS Percent: 1 %
Brady Statistic AS VP Percent: 48 %
Brady Statistic RA Percent Paced: 49 %
Implantable Lead Implant Date: 20160212
Implantable Lead Location: 753860
Implantable Lead Model: 1948
Lead Channel Impedance Value: 510 Ohm
Lead Channel Impedance Value: 540 Ohm
Lead Channel Pacing Threshold Amplitude: 1.5 V
Lead Channel Sensing Intrinsic Amplitude: 12 mV
Lead Channel Setting Pacing Amplitude: 1.625
Lead Channel Setting Pacing Amplitude: 1.75 V
Lead Channel Setting Pacing Pulse Width: 0.4 ms
MDC IDC LEAD IMPLANT DT: 20160212
MDC IDC LEAD LOCATION: 753859
MDC IDC MSMT BATTERY REMAINING PERCENTAGE: 95.5 %
MDC IDC MSMT BATTERY VOLTAGE: 2.99 V
MDC IDC MSMT LEADCHNL RA PACING THRESHOLD AMPLITUDE: 0.625 V
MDC IDC MSMT LEADCHNL RA PACING THRESHOLD PULSEWIDTH: 0.4 ms
MDC IDC MSMT LEADCHNL RA SENSING INTR AMPL: 2.9 mV
MDC IDC MSMT LEADCHNL RV PACING THRESHOLD PULSEWIDTH: 0.4 ms
MDC IDC PG IMPLANT DT: 20160212
MDC IDC PG SERIAL: 3049931
MDC IDC SESS DTM: 20190730100053
MDC IDC SET LEADCHNL RV SENSING SENSITIVITY: 4 mV
MDC IDC STAT BRADY AS VS PERCENT: 1 %
MDC IDC STAT BRADY RV PERCENT PACED: 99 %

## 2018-06-24 ENCOUNTER — Other Ambulatory Visit: Payer: Self-pay | Admitting: Cardiovascular Disease

## 2018-06-27 ENCOUNTER — Other Ambulatory Visit: Payer: Self-pay | Admitting: Internal Medicine

## 2018-06-27 DIAGNOSIS — I10 Essential (primary) hypertension: Secondary | ICD-10-CM | POA: Diagnosis not present

## 2018-06-27 DIAGNOSIS — I5032 Chronic diastolic (congestive) heart failure: Secondary | ICD-10-CM | POA: Diagnosis not present

## 2018-06-27 DIAGNOSIS — I480 Paroxysmal atrial fibrillation: Secondary | ICD-10-CM | POA: Diagnosis not present

## 2018-06-27 DIAGNOSIS — I2510 Atherosclerotic heart disease of native coronary artery without angina pectoris: Secondary | ICD-10-CM | POA: Diagnosis not present

## 2018-06-27 DIAGNOSIS — G8929 Other chronic pain: Secondary | ICD-10-CM

## 2018-06-27 DIAGNOSIS — M545 Low back pain: Secondary | ICD-10-CM | POA: Diagnosis not present

## 2018-06-27 DIAGNOSIS — E7800 Pure hypercholesterolemia, unspecified: Secondary | ICD-10-CM | POA: Diagnosis not present

## 2018-06-27 DIAGNOSIS — G20 Parkinson's disease: Secondary | ICD-10-CM | POA: Diagnosis not present

## 2018-07-02 ENCOUNTER — Ambulatory Visit: Payer: BC Managed Care – PPO | Admitting: Family

## 2018-07-04 ENCOUNTER — Ambulatory Visit
Admission: RE | Admit: 2018-07-04 | Discharge: 2018-07-04 | Disposition: A | Discharge status: Home / Self Care | Payer: Medicare Other | Source: Ambulatory Visit | Attending: Internal Medicine | Admitting: Internal Medicine

## 2018-07-04 DIAGNOSIS — M4316 Spondylolisthesis, lumbar region: Secondary | ICD-10-CM | POA: Insufficient documentation

## 2018-07-04 DIAGNOSIS — M545 Low back pain, unspecified: Secondary | ICD-10-CM

## 2018-07-04 DIAGNOSIS — M4317 Spondylolisthesis, lumbosacral region: Secondary | ICD-10-CM | POA: Diagnosis not present

## 2018-07-04 DIAGNOSIS — M48061 Spinal stenosis, lumbar region without neurogenic claudication: Secondary | ICD-10-CM | POA: Insufficient documentation

## 2018-07-04 DIAGNOSIS — M4807 Spinal stenosis, lumbosacral region: Secondary | ICD-10-CM | POA: Insufficient documentation

## 2018-07-04 DIAGNOSIS — I700 Atherosclerosis of aorta: Secondary | ICD-10-CM | POA: Insufficient documentation

## 2018-07-04 DIAGNOSIS — G8929 Other chronic pain: Secondary | ICD-10-CM

## 2018-07-04 DIAGNOSIS — M5126 Other intervertebral disc displacement, lumbar region: Secondary | ICD-10-CM | POA: Diagnosis not present

## 2018-07-04 DIAGNOSIS — Z1211 Encounter for screening for malignant neoplasm of colon: Secondary | ICD-10-CM | POA: Diagnosis not present

## 2018-07-04 DIAGNOSIS — M47896 Other spondylosis, lumbar region: Secondary | ICD-10-CM | POA: Insufficient documentation

## 2018-07-04 NOTE — Progress Notes (Signed)
Patient ID: Matthew Howe, male    DOB: Oct 10, 1946, 72 y.o.   MRN: [id number]  HPI This is a 72 y/o male with a history of parkinson's, HTN, hyperlipidemia, GERD, CAD, complete heart block (pacemaker), atrial fibrillation, AICD and chronic heart failure.   Echo done 12/08/16 reviewed and shows an EF of 55-60% along with mild AR/MR. Previous echo was done 07/28/15 and showed an EF of 55-60% along with mild AR. Had a cardiac catheterization done 11/20/12 and showed mild nonobstructive disease.  Has not been admitted or been in the ED in the last 6 months.   He presents today for a follow-up visit with a chief complaint of moderate fatigue upon minimal exertion. He describes this as chronic in nature having been present for several years but has worsened over the last couple of months. He has associated shortness of breath, tremors, pedal edema, abdominal distention, back pain, difficulty sleeping and gradual weight gain along with this. He denies any palpitations, chest pain or dizziness. Has to see a spinal surgeon next week and had a CT scan recently done.   Past Medical History:  Diagnosis Date  . AICD (automatic cardioverter/defibrillator) present   . Atrial fibrillation ([place])   . CHF (congestive heart failure) ([place])   . Complete heart block ([place])    a. s/p St. Jude PPM 11/2014.  [name] Coronary artery disease   . GERD (gastroesophageal reflux disease)   . Heart murmur   . Hyperlipidemia   . Hypertension   . Parkinson's disease ([place])   . Presence of permanent cardiac pacemaker   . Shortness of breath dyspnea    Past Surgical History:  Procedure Laterality Date  . APPENDECTOMY  12/2010  . CARDIAC CATHETERIZATION  11/20/2012   Known CAD with one vessel coronary disease of left descending artery by cardiac cath.   [name] CARDIOVERSION N/A 11/21/2016   Procedure: CARDIOVERSION;  Surgeon: Deboraha Sprang, MD;  Location: [facility] ORS;  Service: Cardiovascular;  Laterality: N/A;  . COLONOSCOPY  2018  .  INSERT / REPLACE / REMOVE PACEMAKER    . PACEMAKER INSERTION  11-21-14   STJ Assurity dual chamber pacemaker implanted by Dr Rayann Heman for CHB  . PERMANENT PACEMAKER INSERTION N/A 11/21/2014   Procedure: PERMANENT PACEMAKER INSERTION;  Surgeon: Thompson Grayer, MD;  Location[facility] CATH LAB;  Service: Cardiovascular;  Laterality: N/A;  . TEE WITHOUT CARDIOVERSION N/A 11/21/2016   Procedure: TRANSESOPHAGEAL ECHOCARDIOGRAM (TEE);  Surgeon: Deboraha Sprang, MD;  Location: [facility] ORS;  Service: Cardiovascular;  Laterality: N/A;  . UPPER GI ENDOSCOPY     Family History  Problem Relation Age of Onset  . Heart disease Mother   . Heart disease Father   . Colon cancer Father   . Stroke Sister    Social History   Tobacco Use  . Smoking status: Never Smoker  . Smokeless tobacco: Never Used  Substance Use Topics  . Alcohol use: No    Alcohol/week: 0.0 standard drinks   Allergies  Allergen Reactions  . Linzess [Linaclotide] Shortness Of Breath  . Amitiza [Lubiprostone] Other (See Comments)    Abdominal cramps  . Codeine Diarrhea and Nausea And Vomiting  . Norco [Hydrocodone-Acetaminophen] Diarrhea and Nausea And Vomiting  . Omeprazole Diarrhea and Nausea And Vomiting  . Requip  [Ropinirole] Nausea Only   Prior to Admission medications   Medication Sig Start Date End Date Taking? Authorizing Provider  allopurinol (ZYLOPRIM) 100 MG tablet Take 200 mg by mouth daily.  05/28/18  Yes [provider]  bisacodyl (BISACODYL) 5 MG EC tablet Take 5 mg by mouth daily as needed.    Yes [provider]  carvedilol (COREG) 12.5 MG tablet TAKE 1 TABLET (12.5 MG TOTAL) BY MOUTH 2 (TWO) TIMES DAILY. 05/09/18 08/07/18 Yes Gollan, Kathlene November, MD  dicyclomine (BENTYL) 20 MG tablet Take 20 mg by mouth 3 (three) times daily with meals.  04/04/13  Yes [provider]  ELIQUIS 5 MG TABS tablet TAKE 1 TABLET BY MOUTH TWICE A DAY 05/07/18  Yes Gollan, Kathlene November, MD  fluticasone (FLONASE) 50 MCG/ACT nasal  spray Place 2 sprays into the nose daily as needed for allergies.    Yes [provider]  lansoprazole (PREVACID) 15 MG capsule Take 15 mg by mouth as needed (indigestion).    Yes [provider]  lisinopril (PRINIVIL,ZESTRIL) 40 MG tablet Take 1 tablet (40 mg total) by mouth daily. 11/20/17  Yes Chong Wojdyla, Otila Kluver A, FNP  loratadine (CLARITIN) 10 MG tablet Take 10 mg by mouth daily as needed for allergies.    Yes [provider]  meloxicam (MOBIC) 15 MG tablet Take 15 mg by mouth daily.    Yes [provider]  metolazone (ZAROXOLYN) 2.5 MG tablet TAKE 1 TABLET BY MOUTH DAILY AS NEEDED (ONCE DAILY AS NEEDED FOR WEIGHT GAIN OR SWELLING.). 06/25/18  Yes Gollan, Kathlene November, MD  polyethylene glycol (MIRALAX / GLYCOLAX) packet Take 17 g by mouth daily as needed.    Yes [provider]  potassium chloride (K-DUR) 10 MEQ tablet TAKE 3 TABLETS BY MOUTH TWICE A DAY 05/09/18  Yes Gollan, Kathlene November, MD  pramipexole (MIRAPEX) 0.5 MG tablet Take 0.5 mg by mouth 3 (three) times daily.    Yes [provider]  predniSONE (DELTASONE) 5 MG tablet 1 TAB DAILY, 30 DAYS 04/16/18  Yes [provider]  selegiline (ELDEPRYL) 5 MG capsule Take 5 mg by mouth 2 (two) times daily with a meal.    Yes [provider]  torsemide (DEMADEX) 20 MG tablet TAKE 3 TABLETS (60 MG TOTAL) BY MOUTH 2 (TWO) TIMES DAILY. 05/15/18  Yes Minna Merritts, MD   Review of Systems  Constitutional: Positive for fatigue. Negative for appetite change.  HENT: Negative for congestion, rhinorrhea and sore throat.   Eyes: Negative.   Respiratory: Positive for shortness of breath. Negative for chest tightness.   Cardiovascular: Positive for leg swelling. Negative for chest pain and palpitations.  Gastrointestinal: Positive for abdominal distention. Negative for abdominal pain.  Endocrine: Negative.   Genitourinary: Negative.   Musculoskeletal: Positive for arthralgias (both hands swollen and  stinging) and back pain. Negative for neck pain.  Skin: Negative.   Allergic/Immunologic: Negative.   Neurological: Positive for tremors. Negative for dizziness and light-headedness.  Hematological: Negative for adenopathy. Does not bruise/bleed easily.  Psychiatric/Behavioral: Positive for sleep disturbance (sleeping on 2 pillows; sometimes restless sleep). Negative for dysphoric mood. The patient is not nervous/anxious.    Vitals:   07/05/18 0922  BP: 139/61  Pulse: 60  Resp: 18  SpO2: 97%  Weight: 270 lb 4 oz (122.6 kg)  Height: 6' (1.829 m)   Wt Readings from Last 3 Encounters:  07/05/18 270 lb 4 oz (122.6 kg)  05/29/18 259 lb 4 oz (117.6 kg)  03/29/18 251 lb 4 oz (114 kg)   Lab Results  Component Value Date   CREATININE 1.38 (H) 10/17/2017   CREATININE 1.39 (H) 08/08/2017   CREATININE 1.36 (H) 11/15/2016  Physical Exam  Constitutional: He is oriented to person, place, and time. He appears well-developed and well-nourished.  HENT:  Head: Normocephalic and atraumatic.  Eyes: Pupils are equal, round, and reactive to light. Conjunctivae are normal.  Neck: Normal range of motion. Neck supple. No JVD present.  Cardiovascular: Normal rate and regular rhythm.  Pulmonary/Chest: Effort normal. He has no wheezes. He has no rales.  Abdominal: Soft. He exhibits distension. There is no tenderness.  Musculoskeletal: He exhibits edema (1+ edema with R>L). He exhibits no tenderness.  Both hands are swollen over the dorsum of the hands and all the fingers are also swollen.   Neurological: He is alert and oriented to person, place, and time.  Skin: Skin is warm and dry.  Psychiatric: He has a normal mood and affect. His behavior is normal. Thought content normal.  Nursing note and vitals reviewed.  Assessment & Plan:  1: Chronic heart failure with preserved ejection fraction- - NYHA class III - mildly fluid overloaded today; taking 60mg  torsemide BID  - weighing daily; Reminded to  call for an overnight weight gain of >2 pounds or a weekly weight gain of >5 pounds - weight up 15 pounds from last visit here 4 months ago; says this has occurred as his back pain has worsened which has limited his exercise and ability to work - not adding salt to his food and wife is diligent about reading food labels - saw cardiologist Rockey Situ) 03/29/18 - PharmD reconciled medications with the patient - does not take the flu vaccine; encouraged good handwashing  2: HTN- - BP looks good today - saw PCP Edwina Barth) 06/27/18 - BMP from 04/03/18 reviewed and showed sodium 142, potassium 5.1, creatinine 1.5  and GFR 46.  3: Atrial fibrillation- - cardioversion done 11/21/16 and so far, he has remained in NSR. Reports feeling better since the cardioversion was done.  - saw EP Caryl Comes) 05/29/18  4: Parkinson's Disease- - continues to have tremors - saw neurologist Melrose Nakayama) 04/17/18  5: Back pain- - had lumbar CT recently done - sees spinal surgeon next week to discuss next steps - has been to the chiropractor once with some relief  Medication list was reviewed.  Return in 6 months or sooner for any questions/problems before then.

## 2018-07-05 ENCOUNTER — Encounter: Payer: Self-pay | Admitting: Family

## 2018-07-05 ENCOUNTER — Ambulatory Visit: Payer: Medicare Other | Attending: Family | Admitting: Family

## 2018-07-05 VITALS — BP 139/61 | HR 60 | Resp 18 | Ht 72.0 in | Wt 270.2 lb

## 2018-07-05 DIAGNOSIS — I2510 Atherosclerotic heart disease of native coronary artery without angina pectoris: Secondary | ICD-10-CM | POA: Diagnosis not present

## 2018-07-05 DIAGNOSIS — Z7901 Long term (current) use of anticoagulants: Secondary | ICD-10-CM | POA: Diagnosis not present

## 2018-07-05 DIAGNOSIS — K219 Gastro-esophageal reflux disease without esophagitis: Secondary | ICD-10-CM | POA: Insufficient documentation

## 2018-07-05 DIAGNOSIS — Z79899 Other long term (current) drug therapy: Secondary | ICD-10-CM | POA: Insufficient documentation

## 2018-07-05 DIAGNOSIS — I110 Hypertensive heart disease with heart failure: Secondary | ICD-10-CM | POA: Diagnosis not present

## 2018-07-05 DIAGNOSIS — I10 Essential (primary) hypertension: Secondary | ICD-10-CM

## 2018-07-05 DIAGNOSIS — I4891 Unspecified atrial fibrillation: Secondary | ICD-10-CM | POA: Diagnosis not present

## 2018-07-05 DIAGNOSIS — E785 Hyperlipidemia, unspecified: Secondary | ICD-10-CM | POA: Diagnosis not present

## 2018-07-05 DIAGNOSIS — I442 Atrioventricular block, complete: Secondary | ICD-10-CM | POA: Insufficient documentation

## 2018-07-05 DIAGNOSIS — Z885 Allergy status to narcotic agent status: Secondary | ICD-10-CM | POA: Diagnosis not present

## 2018-07-05 DIAGNOSIS — Z950 Presence of cardiac pacemaker: Secondary | ICD-10-CM | POA: Insufficient documentation

## 2018-07-05 DIAGNOSIS — Z8249 Family history of ischemic heart disease and other diseases of the circulatory system: Secondary | ICD-10-CM | POA: Insufficient documentation

## 2018-07-05 DIAGNOSIS — I480 Paroxysmal atrial fibrillation: Secondary | ICD-10-CM

## 2018-07-05 DIAGNOSIS — M549 Dorsalgia, unspecified: Secondary | ICD-10-CM | POA: Diagnosis not present

## 2018-07-05 DIAGNOSIS — I5032 Chronic diastolic (congestive) heart failure: Secondary | ICD-10-CM | POA: Insufficient documentation

## 2018-07-05 DIAGNOSIS — Z9889 Other specified postprocedural states: Secondary | ICD-10-CM | POA: Insufficient documentation

## 2018-07-05 DIAGNOSIS — G20 Parkinson's disease: Secondary | ICD-10-CM | POA: Diagnosis not present

## 2018-07-05 DIAGNOSIS — M545 Low back pain, unspecified: Secondary | ICD-10-CM

## 2018-07-05 NOTE — Patient Instructions (Signed)
Continue weighing daily and call for an overnight weight gain of > 2 pounds or a weekly weight gain of >5 pounds. 

## 2018-07-06 ENCOUNTER — Encounter: Payer: Self-pay | Admitting: Family

## 2018-07-06 DIAGNOSIS — M549 Dorsalgia, unspecified: Secondary | ICD-10-CM | POA: Insufficient documentation

## 2018-07-06 DIAGNOSIS — G5603 Carpal tunnel syndrome, bilateral upper limbs: Secondary | ICD-10-CM | POA: Diagnosis not present

## 2018-07-09 DIAGNOSIS — M1A9XX1 Chronic gout, unspecified, with tophus (tophi): Secondary | ICD-10-CM | POA: Diagnosis not present

## 2018-07-09 DIAGNOSIS — N183 Chronic kidney disease, stage 3 (moderate): Secondary | ICD-10-CM | POA: Diagnosis not present

## 2018-07-09 DIAGNOSIS — M1990 Unspecified osteoarthritis, unspecified site: Secondary | ICD-10-CM | POA: Diagnosis not present

## 2018-07-10 DIAGNOSIS — M48062 Spinal stenosis, lumbar region with neurogenic claudication: Secondary | ICD-10-CM | POA: Diagnosis not present

## 2018-07-10 DIAGNOSIS — Z9289 Personal history of other medical treatment: Secondary | ICD-10-CM

## 2018-07-10 DIAGNOSIS — M4316 Spondylolisthesis, lumbar region: Secondary | ICD-10-CM | POA: Diagnosis not present

## 2018-07-10 HISTORY — DX: Personal history of other medical treatment: Z92.89

## 2018-07-11 ENCOUNTER — Telehealth: Payer: Self-pay | Admitting: Cardiovascular Disease

## 2018-07-11 ENCOUNTER — Other Ambulatory Visit: Payer: Self-pay | Admitting: Neurosurgery

## 2018-07-11 NOTE — Telephone Encounter (Signed)
° °  [facility] Pre-operative Risk Assessment    Request for surgical clearance:  1. What type of surgery is being performed? Posterior Lumbar Fusion   2. When is this surgery scheduled? 08/06/18   3. What type of clearance is required (medical clearance vs. Pharmacy clearance to hold med vs. Both)? Pharmacy   4. Are there any medications that need to be held prior to surgery and how long?   5. Practice name and name of physician performing surgery?  [facility] Dr Gaylyn Rong    6. What is your office phone number  [phone]    7.   What is your office fax number [phone]   8.   Anesthesia type (None, local, MAC, general) ?

## 2018-07-11 NOTE — Telephone Encounter (Signed)
Routing to Dr Rockey Situ for clearance. On Eliquis.

## 2018-07-14 NOTE — Telephone Encounter (Signed)
Acceptable risk for procedure He can hold eliquis 2-3 days prior to surgery Restart when approved by surgeon

## 2018-07-16 NOTE — Telephone Encounter (Signed)
Sent to fax number provided and removed from triage pool

## 2018-07-17 DIAGNOSIS — G5603 Carpal tunnel syndrome, bilateral upper limbs: Secondary | ICD-10-CM | POA: Diagnosis not present

## 2018-07-22 DIAGNOSIS — G5603 Carpal tunnel syndrome, bilateral upper limbs: Secondary | ICD-10-CM | POA: Insufficient documentation

## 2018-07-25 ENCOUNTER — Other Ambulatory Visit: Payer: Self-pay | Admitting: Neurosurgery

## 2018-07-30 NOTE — Pre-Procedure Instructions (Signed)
Matthew Howe  07/30/2018      [id number] - [place], [place] - [address] [place] [address] Phone: [phone] Fax: [phone]    Your procedure is scheduled on October 28th.  Report to [facility] Admitting at [place].M.  Call this number if you have problems the morning of surgery:  [phone]   Remember:  Do not eat or drink after midnight.    Take these medicines the morning of surgery with A SIP OF WATER   Allopurinol  Eye Drops (if needed)  Coreg  Clarinex (if needed)  Flonase (if needed)  Mirapex  Selegiline  Prednisone  Follow your surgeon's instructions for when to stop/resume your Eliquis.  7 days prior to surgery STOP taking any Meloxicam, Aspirin(unless otherwise instructed by your surgeon), Aleve, Naproxen, Ibuprofen, Motrin, Advil, Goody's, BC's, all herbal medications, fish oil, and all vitamins     Do not wear jewelry.  Do not wear lotions, powders, or colognes, or deodorant.  Men may shave face and neck.  Do not bring valuables to the hospital.  [facility] is not responsible for any belongings or valuables.  Contacts, dentures or bridgework may not be worn into surgery.  Leave your suitcase in the car.  After surgery it may be brought to your room.  For patients admitted to the hospital, discharge time will be determined by your treatment team.  Patients discharged the day of surgery will not be allowed to drive home.    [facility]- Preparing For Surgery  Before surgery, you can play an important role. Because skin is not sterile, your skin needs to be as free of germs as possible. You can reduce the number of germs on your skin by washing with CHG (chlorahexidine gluconate) Soap before surgery.  CHG is an antiseptic cleaner which kills germs and bonds with the skin to continue killing germs even after washing.    Oral Hygiene is also important to reduce your risk of infection.  Remember -  BRUSH YOUR TEETH THE MORNING OF SURGERY WITH YOUR REGULAR TOOTHPASTE  Please do not use if you have an allergy to CHG or antibacterial soaps. If your skin becomes reddened/irritated stop using the CHG.  Do not shave (including legs and underarms) for at least 48 hours prior to first CHG shower. It is OK to shave your face.  Please follow these instructions carefully.   1. Shower the NIGHT BEFORE SURGERY and the MORNING OF SURGERY with CHG.   2. If you chose to wash your hair, wash your hair first as usual with your normal shampoo.  3. After you shampoo, rinse your hair and body thoroughly to remove the shampoo.  4. Use CHG as you would any other liquid soap. You can apply CHG directly to the skin and wash gently with a scrungie or a clean washcloth.   5. Apply the CHG Soap to your body ONLY FROM THE NECK DOWN.  Do not use on open wounds or open sores. Avoid contact with your eyes, ears, mouth and genitals (private parts). Wash Face and genitals (private parts)  with your normal soap.  6. Wash thoroughly, paying special attention to the area where your surgery will be performed.  7. Thoroughly rinse your body with warm water from the neck down.  8. DO NOT shower/wash with your normal soap after using and rinsing off the CHG Soap.  9. Pat yourself dry with a  CLEAN TOWEL.  10. Wear CLEAN PAJAMAS to bed the night before surgery, wear comfortable clothes the morning of surgery  11. Place CLEAN SHEETS on your bed the night of your first shower and DO NOT SLEEP WITH PETS.    Day of Surgery:  Do not apply any deodorants/lotions.  Please wear clean clothes to the hospital/surgery center.   Remember to brush your teeth WITH YOUR REGULAR TOOTHPASTE.    Please read over the following fact sheets that you were given.

## 2018-07-31 ENCOUNTER — Encounter (HOSPITAL_COMMUNITY): Payer: Self-pay

## 2018-07-31 ENCOUNTER — Encounter (HOSPITAL_COMMUNITY)
Admission: RE | Admit: 2018-07-31 | Discharge: 2018-07-31 | Disposition: A | Discharge status: Home / Self Care | Payer: Medicare Other | Source: Ambulatory Visit | Attending: Neurosurgery | Admitting: Neurosurgery

## 2018-07-31 ENCOUNTER — Other Ambulatory Visit: Payer: Self-pay

## 2018-07-31 DIAGNOSIS — E785 Hyperlipidemia, unspecified: Secondary | ICD-10-CM | POA: Insufficient documentation

## 2018-07-31 DIAGNOSIS — I2510 Atherosclerotic heart disease of native coronary artery without angina pectoris: Secondary | ICD-10-CM | POA: Insufficient documentation

## 2018-07-31 DIAGNOSIS — K219 Gastro-esophageal reflux disease without esophagitis: Secondary | ICD-10-CM | POA: Insufficient documentation

## 2018-07-31 DIAGNOSIS — G20 Parkinson's disease: Secondary | ICD-10-CM | POA: Insufficient documentation

## 2018-07-31 DIAGNOSIS — Z01812 Encounter for preprocedural laboratory examination: Secondary | ICD-10-CM | POA: Diagnosis not present

## 2018-07-31 DIAGNOSIS — N183 Chronic kidney disease, stage 3 (moderate): Secondary | ICD-10-CM | POA: Diagnosis not present

## 2018-07-31 DIAGNOSIS — M4316 Spondylolisthesis, lumbar region: Secondary | ICD-10-CM | POA: Diagnosis not present

## 2018-07-31 DIAGNOSIS — Z79899 Other long term (current) drug therapy: Secondary | ICD-10-CM | POA: Insufficient documentation

## 2018-07-31 DIAGNOSIS — I129 Hypertensive chronic kidney disease with stage 1 through stage 4 chronic kidney disease, or unspecified chronic kidney disease: Secondary | ICD-10-CM | POA: Diagnosis not present

## 2018-07-31 HISTORY — DX: Chronic kidney disease, unspecified: N18.9

## 2018-07-31 HISTORY — DX: Other complications of anesthesia, initial encounter: T88.59XA

## 2018-07-31 HISTORY — DX: Adverse effect of unspecified anesthetic, initial encounter: T41.45XA

## 2018-07-31 LAB — CBC
HEMATOCRIT: 37.4 % — AB (ref 39.0–52.0)
Hemoglobin: 11.6 g/dL — ABNORMAL LOW (ref 13.0–17.0)
MCH: 29.6 pg (ref 26.0–34.0)
MCHC: 31 g/dL (ref 30.0–36.0)
MCV: 95.4 fL (ref 80.0–100.0)
NRBC: 0 % (ref 0.0–0.2)
Platelets: 272 10*3/uL (ref 150–400)
RBC: 3.92 MIL/uL — AB (ref 4.22–5.81)
RDW: 15 % (ref 11.5–15.5)
WBC: 10.2 10*3/uL (ref 4.0–10.5)

## 2018-07-31 LAB — BASIC METABOLIC PANEL
Anion gap: 8 (ref 5–15)
BUN: 38 mg/dL — ABNORMAL HIGH (ref 8–23)
CO2: 26 mmol/L (ref 22–32)
Calcium: 9 mg/dL (ref 8.9–10.3)
Chloride: 107 mmol/L (ref 98–111)
Creatinine, Ser: 1.65 mg/dL — ABNORMAL HIGH (ref 0.61–1.24)
GFR calc non Af Amer: 40 mL/min — ABNORMAL LOW (ref >60)
GFR, EST AFRICAN AMERICAN: 47 mL/min — AB (ref >60)
Glucose, Bld: 117 mg/dL — ABNORMAL HIGH (ref 70–99)
Potassium: 3.9 mmol/L (ref 3.5–5.1)
SODIUM: 141 mmol/L (ref 135–145)

## 2018-07-31 LAB — ABO/RH: ABO/RH(D): B POS

## 2018-07-31 LAB — SURGICAL PCR SCREEN
MRSA, PCR: NEGATIVE
Staphylococcus aureus: NEGATIVE

## 2018-07-31 LAB — TYPE AND SCREEN
ABO/RH(D): B POS
ANTIBODY SCREEN: NEGATIVE

## 2018-07-31 NOTE — Progress Notes (Addendum)
PCP - Harrel Lemon- Care everywhere Cardiologist - Dr Rockey Situ EP- Dr. Caryl Comes Neurologist- Dr. Melrose Nakayama- care everywhere  EKG - 05/29/18 ECHO - 2018 Cardiac Cath - 2014  Blood Thinner Instructions: last dose of Eliquis 08/02/18  Spoke with pt Wife, pt will continue to take Selegiline (Eldepryl). Per wife taking this medication the day of surgery made him very hard to wake up after surgery. She states patient will not take this medication the day of surgery.   Anesthesia review: heart hx, PPM/ICD St Jude. Rep has been notified and to be there DOS at 7:00/7:15 AM  Patient denies shortness of breath, fever, cough and chest pain at PAT appointment   Patient verbalized understanding of instructions that were given to them at the PAT appointment. Patient was also instructed that they will need to review over the PAT instructions again at home before surgery.

## 2018-07-31 NOTE — Pre-Procedure Instructions (Signed)
Matthew Howe  07/31/2018      [id number] - [place], [place] - [address] [place] [address] Phone: [phone] Fax: [phone]    Your procedure is scheduled on October 28th.  Report to [facility] Admitting at [place].M.  Call this number if you have problems the morning of surgery:  [phone]   Remember:  Do not eat or drink after midnight.    Take these medicines the morning of surgery with A SIP OF WATER     Coreg (Carvedilol)  Allopurinol (Zyloprim)  Lansoprazole (Prevacid)  Eye Drops (if needed)  Clarinex (if needed)  Flonase (if needed)  Mirapex  Prednisone  STOP TAKING Selegiline (Eldepryl)  Follow your surgeon's instructions for when to stop/resume your Eliquis.  7 days prior to surgery STOP taking any Meloxicam, Aspirin(unless otherwise instructed by your surgeon), Aleve, Naproxen, Ibuprofen, Motrin, Advil, Goody's, BC's, all herbal medications, fish oil, and all vitamins     Do not wear jewelry.  Do not wear lotions, powders, or colognes, or deodorant.  Men may shave face and neck.  Do not bring valuables to the hospital.  [facility] is not responsible for any belongings or valuables.  Contacts, dentures or bridgework may not be worn into surgery.  Leave your suitcase in the car.  After surgery it may be brought to your room.  For patients admitted to the hospital, discharge time will be determined by your treatment team.  Patients discharged the day of surgery will not be allowed to drive home.    [facility]- Preparing For Surgery  Before surgery, you can play an important role. Because skin is not sterile, your skin needs to be as free of germs as possible. You can reduce the number of germs on your skin by washing with CHG (chlorahexidine gluconate) Soap before surgery.  CHG is an antiseptic cleaner which kills germs and bonds with the skin to continue killing germs even after washing.    Oral  Hygiene is also important to reduce your risk of infection.  Remember - BRUSH YOUR TEETH THE MORNING OF SURGERY WITH YOUR REGULAR TOOTHPASTE  Please do not use if you have an allergy to CHG or antibacterial soaps. If your skin becomes reddened/irritated stop using the CHG.  Do not shave (including legs and underarms) for at least 48 hours prior to first CHG shower. It is OK to shave your face.  Please follow these instructions carefully.   1. Shower the NIGHT BEFORE SURGERY and the MORNING OF SURGERY with CHG.   2. If you chose to wash your hair, wash your hair first as usual with your normal shampoo.  3. After you shampoo, rinse your hair and body thoroughly to remove the shampoo.  4. Use CHG as you would any other liquid soap. You can apply CHG directly to the skin and wash gently with a scrungie or a clean washcloth.   5. Apply the CHG Soap to your body ONLY FROM THE NECK DOWN.  Do not use on open wounds or open sores. Avoid contact with your eyes, ears, mouth and genitals (private parts). Wash Face and genitals (private parts)  with your normal soap.  6. Wash thoroughly, paying special attention to the area where your surgery will be performed.  7. Thoroughly rinse your body with warm water from the neck down.  8. DO NOT shower/wash with your normal soap after using and rinsing off  the CHG Soap.  9. Pat yourself dry with a CLEAN TOWEL.  10. Wear CLEAN PAJAMAS to bed the night before surgery, wear comfortable clothes the morning of surgery  11. Place CLEAN SHEETS on your bed the night of your first shower and DO NOT SLEEP WITH PETS.    Day of Surgery:  Do not apply any deodorants/lotions.  Please wear clean clothes to the hospital/surgery center.   Remember to brush your teeth WITH YOUR REGULAR TOOTHPASTE.    Please read over the following fact sheets that you were given.

## 2018-08-01 ENCOUNTER — Encounter (HOSPITAL_COMMUNITY): Payer: Self-pay

## 2018-08-01 NOTE — Progress Notes (Signed)
Anesthesia Chart Review:  Case:  [id number] Date/Time:  08/06/18 0715   Procedure:  POSTERIOR LUMBAR INTERBODY FUSION, INTERBODY PROSTHESIS, POSTERIOR INSTRUMENTATION AND FUSION LUMBAR 4- LUMBAR 5 (N/A ) - POSTERIOR LUMBAR INTERBODY FUSION, INTERBODY PROSTHESIS, POSTERIOR INSTRUMENTATION AND FUSION LUMBAR 4- LUMBAR 5   Anesthesia type:  General   Pre-op diagnosis:  SPONDYLOLISTHESIS, LUMBAR REGION   Location:  [facility] ROOM 19 / [facility]   Surgeon:  Newman Pies, MD      DISCUSSION: 72 yo male never smoker. Pertinent hx includes CKD III, Paroxysmal afib (s/p cardioversion 11/2016), CAD, CHB treated with St Jude PPM, HFpEF, GERD, HTN, Parkinson's, DOE. Pt reports he had difficulty waking up from previous surgery and thinks it was due to selegiline and will note take on DOS.  Discussed selegiline with Dr. Ermalene Postin. He advised that ideal situation would be off the medication for 1-2 weeks, however given that the pt is currently still taking the med and surgery is in 5d, it's likely best for him to continue as stopping now would disrupt management of his condition and not fully wash the medication out of his system. Pt says he will not take the medication on DOS as he is concerned it caused him difficulty awakening from previous anesthesia.  Cardiac clearance by Dr. Rockey Situ 07/14/2018 "Acceptable risk for procedure. He can hold eliquis 2-3 days prior to surgery. Restart when approved by surgeon.".  Device form on chart.  Anticipate he can proceed as planned barring acute status change.  VS: BP (!) 142/58   Pulse 60   Temp (!) 36.4 C   Resp 20   Ht 6\' 1"  (1.854 m)   Wt 124 kg   SpO2 97%   BMI 36.06 kg/m   PROVIDERS: Baxter Hire, MD is PCP  Ida Rogue, MD is Cardiologist  Virl Axe, MD is EP Cardiologist  Gurney Maxin, MD is Neurologist  LABS: Labs reviewed: Acceptable for surgery. Elevated creatinine. Review of recent labs in care everywhere shows his baseline ~1.5 over the  past year.  (all labs ordered are listed, but only abnormal results are displayed)  Labs Reviewed  BASIC METABOLIC PANEL - Abnormal; Notable for the following components:      Result Value   Glucose, Bld 117 (*)    BUN 38 (*)    Creatinine, Ser 1.65 (*)    GFR calc non Af Amer 40 (*)    GFR calc Af Amer 47 (*)    All other components within normal limits  CBC - Abnormal; Notable for the following components:   RBC 3.92 (*)    Hemoglobin 11.6 (*)    HCT 37.4 (*)    All other components within normal limits  SURGICAL PCR SCREEN  TYPE AND SCREEN  ABO/RH    EKG: 05/29/2018: AV Dual paced rhythm  CV: TTE 12/08/2016: Study Conclusions  - Left ventricle: The cavity size was normal. Wall thickness was   normal. Systolic function was normal. The estimated ejection   fraction was in the range of 55% to 60%. The study is not   technically sufficient to allow evaluation of LV diastolic   function. - Aortic valve: There was mild regurgitation per color doppler.   Estimated PHT  538ms. - Aortic root: The aortic root was mildly dilated, 3.8cm. - Ascending aorta: The ascending aorta was mildly dilated, 3.9cm. - Mitral valve: There was mild regurgitation. - Left atrium: The atrium was mildly to moderately dilated.  Cath 11/20/2012: Impressions: Atrial flutter  with angina and abnormal stress test.  Normal LV function.  Mild one-vessel CAD. Coronary circulation: Proximal LAD: There is a 25% stenosis.  And a second lesion, there was a 40% stenosis. Recommendations: No further cardiac intervention at this time continue medical management of risk factors and treatment of a flutter.  Past Medical History:  Diagnosis Date  . AICD (automatic cardioverter/defibrillator) present    St Jude  . Atrial fibrillation ([place])   . CHF (congestive heart failure) ([place])   . Complete heart block ([place])    a. s/p St. Jude PPM 11/2014.  [name] Complication of anesthesia    stayed under too long d/t taking  Parkinson's medication  . Coronary artery disease   . GERD (gastroesophageal reflux disease)    "not as bad as it used to be"  . Heart murmur   . Hyperlipidemia   . Hypertension   . Parkinson's disease ([place])   . Presence of permanent cardiac pacemaker    St Jude  . Shortness of breath dyspnea     Past Surgical History:  Procedure Laterality Date  . APPENDECTOMY  12/2010  . CARDIAC CATHETERIZATION  11/20/2012   Known CAD with one vessel coronary disease of left descending artery by cardiac cath.   [name] CARDIOVERSION N/A 11/21/2016   Procedure: CARDIOVERSION;  Surgeon: Deboraha Sprang, MD;  Location: [facility] ORS;  Service: Cardiovascular;  Laterality: N/A;  . COLONOSCOPY  2018  . INSERT / REPLACE / REMOVE PACEMAKER    . PACEMAKER INSERTION  11-21-14   STJ Assurity dual chamber pacemaker implanted by Dr Rayann Heman for CHB  . PERMANENT PACEMAKER INSERTION N/A 11/21/2014   Procedure: PERMANENT PACEMAKER INSERTION;  Surgeon: Thompson Grayer, MD;  Location: [facility] CATH LAB;  Service: Cardiovascular;  Laterality: N/A;  . TEE WITHOUT CARDIOVERSION N/A 11/21/2016   Procedure: TRANSESOPHAGEAL ECHOCARDIOGRAM (TEE);  Surgeon: Deboraha Sprang, MD;  Location: [facility] ORS;  Service: Cardiovascular;  Laterality: N/A;  . UPPER GI ENDOSCOPY      MEDICATIONS: . allopurinol (ZYLOPRIM) 300 MG tablet  . carboxymethylcellulose (REFRESH PLUS) 0.5 % SOLN  . carvedilol (COREG) 12.5 MG tablet  . desloratadine (CLARINEX) 5 MG tablet  . dicyclomine (BENTYL) 20 MG tablet  . ELIQUIS 5 MG TABS tablet  . fluticasone (FLONASE) 50 MCG/ACT nasal spray  . lansoprazole (PREVACID) 15 MG capsule  . lisinopril (PRINIVIL,ZESTRIL) 40 MG tablet  . meloxicam (MOBIC) 15 MG tablet  . metolazone (ZAROXOLYN) 2.5 MG tablet  . polyethylene glycol (MIRALAX / GLYCOLAX) packet  . potassium chloride (K-DUR) 10 MEQ tablet  . pramipexole (MIRAPEX) 0.5 MG tablet  . predniSONE (DELTASONE) 5 MG tablet  . selegiline (ELDEPRYL) 5 MG capsule  . torsemide  (DEMADEX) 20 MG tablet   No current facility-administered medications for this encounter.      Wynonia Musty [facility] Short Stay Center/Anesthesiology Phone [phone] 08/01/2018 12:53 PM

## 2018-08-02 DIAGNOSIS — G20 Parkinson's disease: Secondary | ICD-10-CM | POA: Diagnosis not present

## 2018-08-02 DIAGNOSIS — M1A9XX1 Chronic gout, unspecified, with tophus (tophi): Secondary | ICD-10-CM | POA: Diagnosis not present

## 2018-08-02 DIAGNOSIS — G5603 Carpal tunnel syndrome, bilateral upper limbs: Secondary | ICD-10-CM | POA: Diagnosis not present

## 2018-08-02 DIAGNOSIS — N183 Chronic kidney disease, stage 3 (moderate): Secondary | ICD-10-CM | POA: Diagnosis not present

## 2018-08-03 MED ORDER — DEXTROSE 5 % IV SOLN
3.0000 g | INTRAVENOUS | Status: AC
  Administered 2018-08-06: 3 g via INTRAVENOUS
  Filled 2018-08-03: qty 3

## 2018-08-05 NOTE — Anesthesia Preprocedure Evaluation (Addendum)
Anesthesia Evaluation  Patient identified by MRN, date of birth, ID band Patient awake    Reviewed: Allergy & Precautions, NPO status , Patient's Chart, lab work & pertinent test results, reviewed documented beta blocker date and time   Airway Mallampati: II  TM Distance: >3 FB Neck ROM: Full    Dental  (+) Dental Advisory Given   Pulmonary neg pulmonary ROS,    breath sounds clear to auscultation       Cardiovascular hypertension, Pt. on medications and Pt. on home beta blockers + CAD and +CHF  + dysrhythmias Atrial Fibrillation + pacemaker + Cardiac Defibrillator  Rhythm:Regular Rate:Normal  EKG: 05/29/2018: AV Dual paced rhythm  CV: TTE 12/08/2016: Study Conclusions - Left ventricle: The cavity size was normal. Wall thickness wasnormal. Systolic function was normal. The estimated ejectionfraction was in the range of 55% to 60%. The study is nottechnically sufficient to allow evaluation of LV diastolicfunction. - Aortic valve: There was mild regurgitation per color doppler.Estimated PHT 581ms. - Aortic root: The aortic root was mildly dilated, 3.8cm. - Ascending aorta: The ascending aorta was mildly dilated, 3.9cm. - Mitral valve: There was mild regurgitation. - Left atrium: The atrium was mildly to moderately dilated.   Neuro/Psych Parkinsons    GI/Hepatic Neg liver ROS, GERD  ,  Endo/Other  negative endocrine ROS  Renal/GU CRFRenal disease     Musculoskeletal   Abdominal   Peds  Hematology negative hematology ROS (+)   Anesthesia Other Findings   Reproductive/Obstetrics                            Lab Results  Component Value Date   WBC 10.2 07/31/2018   HGB 11.6 (L) 07/31/2018   HCT 37.4 (L) 07/31/2018   MCV 95.4 07/31/2018   PLT 272 07/31/2018   Lab Results  Component Value Date   CREATININE 1.65 (H) 07/31/2018   BUN 38 (H) 07/31/2018   NA 141 07/31/2018   K 3.9  07/31/2018   CL 107 07/31/2018   CO2 26 07/31/2018    Anesthesia Physical Anesthesia Plan  ASA: III  Anesthesia Plan: General   Post-op Pain Management:    Induction: Intravenous  PONV Risk Score and Plan:   Airway Management Planned: Oral ETT  Additional Equipment:   Intra-op Plan:   Post-operative Plan: Extubation in OR  Informed Consent:   Plan Discussed with:   Anesthesia Plan Comments:         Anesthesia Quick Evaluation

## 2018-08-06 ENCOUNTER — Inpatient Hospital Stay (HOSPITAL_COMMUNITY): Payer: Medicare Other | Admitting: Physician Assistant

## 2018-08-06 ENCOUNTER — Inpatient Hospital Stay (HOSPITAL_COMMUNITY)
Admission: RE | Disposition: A | Discharge status: Home / Self Care | Payer: Self-pay | Source: Home / Self Care | Attending: Neurosurgery

## 2018-08-06 ENCOUNTER — Inpatient Hospital Stay (HOSPITAL_COMMUNITY): Payer: Medicare Other

## 2018-08-06 ENCOUNTER — Inpatient Hospital Stay (HOSPITAL_COMMUNITY)
Admission: RE | Admit: 2018-08-06 | Discharge: 2018-08-07 | DRG: 454 | Disposition: A | Discharge status: Home / Self Care | Payer: Medicare Other | Attending: Neurosurgery | Admitting: Neurosurgery

## 2018-08-06 ENCOUNTER — Encounter (HOSPITAL_COMMUNITY): Payer: Self-pay | Admitting: *Deleted

## 2018-08-06 ENCOUNTER — Inpatient Hospital Stay (HOSPITAL_COMMUNITY): Payer: Medicare Other | Admitting: Certified Registered Nurse Anesthetist

## 2018-08-06 DIAGNOSIS — Z95810 Presence of automatic (implantable) cardiac defibrillator: Secondary | ICD-10-CM | POA: Diagnosis not present

## 2018-08-06 DIAGNOSIS — Z885 Allergy status to narcotic agent status: Secondary | ICD-10-CM | POA: Diagnosis not present

## 2018-08-06 DIAGNOSIS — Z7952 Long term (current) use of systemic steroids: Secondary | ICD-10-CM | POA: Diagnosis not present

## 2018-08-06 DIAGNOSIS — Z888 Allergy status to other drugs, medicaments and biological substances status: Secondary | ICD-10-CM

## 2018-08-06 DIAGNOSIS — N189 Chronic kidney disease, unspecified: Secondary | ICD-10-CM | POA: Diagnosis present

## 2018-08-06 DIAGNOSIS — Z791 Long term (current) use of non-steroidal anti-inflammatories (NSAID): Secondary | ICD-10-CM

## 2018-08-06 DIAGNOSIS — Z823 Family history of stroke: Secondary | ICD-10-CM | POA: Diagnosis not present

## 2018-08-06 DIAGNOSIS — M48061 Spinal stenosis, lumbar region without neurogenic claudication: Secondary | ICD-10-CM | POA: Diagnosis not present

## 2018-08-06 DIAGNOSIS — Z7901 Long term (current) use of anticoagulants: Secondary | ICD-10-CM

## 2018-08-06 DIAGNOSIS — I442 Atrioventricular block, complete: Secondary | ICD-10-CM | POA: Diagnosis present

## 2018-08-06 DIAGNOSIS — M5116 Intervertebral disc disorders with radiculopathy, lumbar region: Secondary | ICD-10-CM | POA: Diagnosis present

## 2018-08-06 DIAGNOSIS — Z8249 Family history of ischemic heart disease and other diseases of the circulatory system: Secondary | ICD-10-CM

## 2018-08-06 DIAGNOSIS — I2510 Atherosclerotic heart disease of native coronary artery without angina pectoris: Secondary | ICD-10-CM | POA: Diagnosis present

## 2018-08-06 DIAGNOSIS — M5136 Other intervertebral disc degeneration, lumbar region: Secondary | ICD-10-CM | POA: Diagnosis present

## 2018-08-06 DIAGNOSIS — M4316 Spondylolisthesis, lumbar region: Principal | ICD-10-CM | POA: Diagnosis present

## 2018-08-06 DIAGNOSIS — E785 Hyperlipidemia, unspecified: Secondary | ICD-10-CM | POA: Diagnosis present

## 2018-08-06 DIAGNOSIS — I4891 Unspecified atrial fibrillation: Secondary | ICD-10-CM | POA: Diagnosis present

## 2018-08-06 DIAGNOSIS — M48062 Spinal stenosis, lumbar region with neurogenic claudication: Secondary | ICD-10-CM | POA: Diagnosis present

## 2018-08-06 DIAGNOSIS — I130 Hypertensive heart and chronic kidney disease with heart failure and stage 1 through stage 4 chronic kidney disease, or unspecified chronic kidney disease: Secondary | ICD-10-CM | POA: Diagnosis present

## 2018-08-06 DIAGNOSIS — I509 Heart failure, unspecified: Secondary | ICD-10-CM | POA: Diagnosis present

## 2018-08-06 DIAGNOSIS — G20 Parkinson's disease: Secondary | ICD-10-CM | POA: Diagnosis present

## 2018-08-06 DIAGNOSIS — M4326 Fusion of spine, lumbar region: Secondary | ICD-10-CM | POA: Diagnosis not present

## 2018-08-06 DIAGNOSIS — I10 Essential (primary) hypertension: Secondary | ICD-10-CM | POA: Diagnosis not present

## 2018-08-06 DIAGNOSIS — M5416 Radiculopathy, lumbar region: Secondary | ICD-10-CM | POA: Diagnosis not present

## 2018-08-06 DIAGNOSIS — Z79899 Other long term (current) drug therapy: Secondary | ICD-10-CM

## 2018-08-06 DIAGNOSIS — K219 Gastro-esophageal reflux disease without esophagitis: Secondary | ICD-10-CM | POA: Diagnosis present

## 2018-08-06 DIAGNOSIS — Z419 Encounter for procedure for purposes other than remedying health state, unspecified: Secondary | ICD-10-CM

## 2018-08-06 HISTORY — PX: BACK SURGERY: SHX140

## 2018-08-06 LAB — PROTIME-INR
INR: 1.04
PROTHROMBIN TIME: 13.5 s (ref 11.4–15.2)

## 2018-08-06 SURGERY — POSTERIOR LUMBAR FUSION 1 LEVEL
Anesthesia: General

## 2018-08-06 MED ORDER — CYCLOBENZAPRINE HCL 10 MG PO TABS
10.0000 mg | ORAL_TABLET | Freq: Three times a day (TID) | ORAL | Status: DC | PRN
  Filled 2018-08-06: qty 1

## 2018-08-06 MED ORDER — EPHEDRINE SULFATE-NACL 50-0.9 MG/10ML-% IV SOSY
PREFILLED_SYRINGE | INTRAVENOUS | Status: DC | PRN
  Administered 2018-08-06: 10 mg via INTRAVENOUS

## 2018-08-06 MED ORDER — ONDANSETRON HCL 4 MG/2ML IJ SOLN
INTRAMUSCULAR | Status: AC
  Filled 2018-08-06: qty 2

## 2018-08-06 MED ORDER — LISINOPRIL 20 MG PO TABS
40.0000 mg | ORAL_TABLET | Freq: Every day | ORAL | Status: DC
  Administered 2018-08-06 – 2018-08-07 (×2): 40 mg via ORAL
  Filled 2018-08-06 (×2): qty 2

## 2018-08-06 MED ORDER — SODIUM CHLORIDE 0.9 % IV SOLN
INTRAVENOUS | Status: DC | PRN
  Administered 2018-08-06: 40 ug/min via INTRAVENOUS

## 2018-08-06 MED ORDER — PREDNISONE 2.5 MG PO TABS: 2.5000 mg | ORAL_TABLET | Freq: Every day | ORAL | Status: DC

## 2018-08-06 MED ORDER — ONDANSETRON HCL 4 MG/2ML IJ SOLN: 4.0000 mg | Freq: Four times a day (QID) | INTRAMUSCULAR | Status: DC | PRN

## 2018-08-06 MED ORDER — OXYCODONE HCL 5 MG PO TABS: 5.0000 mg | ORAL_TABLET | ORAL | Status: DC | PRN

## 2018-08-06 MED ORDER — FENTANYL CITRATE (PF) 250 MCG/5ML IJ SOLN
INTRAMUSCULAR | Status: DC | PRN
  Administered 2018-08-06: 25 ug via INTRAVENOUS
  Administered 2018-08-06: 100 ug via INTRAVENOUS
  Administered 2018-08-06: 50 ug via INTRAVENOUS
  Administered 2018-08-06: 25 ug via INTRAVENOUS
  Administered 2018-08-06: 50 ug via INTRAVENOUS

## 2018-08-06 MED ORDER — ALLOPURINOL 300 MG PO TABS
400.0000 mg | ORAL_TABLET | Freq: Every day | ORAL | Status: DC
  Administered 2018-08-07: 400 mg via ORAL
  Filled 2018-08-06: qty 1

## 2018-08-06 MED ORDER — CHLORHEXIDINE GLUCONATE CLOTH 2 % EX PADS: 6.0000 | MEDICATED_PAD | Freq: Once | CUTANEOUS | Status: DC

## 2018-08-06 MED ORDER — LIDOCAINE 2% (20 MG/ML) 5 ML SYRINGE
INTRAMUSCULAR | Status: AC
  Filled 2018-08-06: qty 5

## 2018-08-06 MED ORDER — HYDROCORTISONE NA SUCCINATE PF 100 MG IJ SOLR
50.0000 mg | Freq: Three times a day (TID) | INTRAMUSCULAR | Status: DC
  Administered 2018-08-06 – 2018-08-07 (×3): 50 mg via INTRAVENOUS
  Filled 2018-08-06 (×4): qty 1

## 2018-08-06 MED ORDER — PRAMIPEXOLE DIHYDROCHLORIDE 0.25 MG PO TABS
0.5000 mg | ORAL_TABLET | Freq: Three times a day (TID) | ORAL | Status: DC
  Administered 2018-08-06 – 2018-08-07 (×3): 0.5 mg via ORAL
  Filled 2018-08-06 (×3): qty 2

## 2018-08-06 MED ORDER — THROMBIN 5000 UNITS EX SOLR
OROMUCOSAL | Status: DC | PRN
  Administered 2018-08-06: 07:00:00 via TOPICAL

## 2018-08-06 MED ORDER — POTASSIUM CHLORIDE ER 10 MEQ PO TBCR
30.0000 meq | EXTENDED_RELEASE_TABLET | Freq: Two times a day (BID) | ORAL | Status: DC
  Administered 2018-08-06 – 2018-08-07 (×2): 30 meq via ORAL
  Filled 2018-08-06 (×3): qty 3

## 2018-08-06 MED ORDER — MIDAZOLAM HCL 2 MG/2ML IJ SOLN
INTRAMUSCULAR | Status: DC | PRN
  Administered 2018-08-06: 1 mg via INTRAVENOUS

## 2018-08-06 MED ORDER — ROCURONIUM BROMIDE 100 MG/10ML IV SOLN
INTRAVENOUS | Status: DC | PRN
  Administered 2018-08-06: 20 mg via INTRAVENOUS
  Administered 2018-08-06: 10 mg via INTRAVENOUS
  Administered 2018-08-06 (×3): 20 mg via INTRAVENOUS
  Administered 2018-08-06: 60 mg via INTRAVENOUS

## 2018-08-06 MED ORDER — LORATADINE 10 MG PO TABS
10.0000 mg | ORAL_TABLET | Freq: Every day | ORAL | Status: DC
  Administered 2018-08-07: 10 mg via ORAL
  Filled 2018-08-06: qty 1

## 2018-08-06 MED ORDER — LIDOCAINE HCL (CARDIAC) PF 100 MG/5ML IV SOSY
PREFILLED_SYRINGE | INTRAVENOUS | Status: DC | PRN
  Administered 2018-08-06: 60 mg via INTRAVENOUS

## 2018-08-06 MED ORDER — SODIUM CHLORIDE 0.9 % IV SOLN: 250.0000 mL | INTRAVENOUS | Status: DC

## 2018-08-06 MED ORDER — POLYETHYLENE GLYCOL 3350 17 G PO PACK: 17.0000 g | PACK | Freq: Every day | ORAL | Status: DC | PRN

## 2018-08-06 MED ORDER — FLUTICASONE PROPIONATE 50 MCG/ACT NA SUSP
2.0000 | Freq: Every day | NASAL | Status: DC
  Administered 2018-08-07: 2 via NASAL
  Filled 2018-08-06: qty 16

## 2018-08-06 MED ORDER — LACTATED RINGERS IV SOLN
INTRAVENOUS | Status: DC | PRN
  Administered 2018-08-06 (×2): via INTRAVENOUS

## 2018-08-06 MED ORDER — OXYCODONE HCL 5 MG PO TABS: 10.0000 mg | ORAL_TABLET | ORAL | Status: DC | PRN

## 2018-08-06 MED ORDER — FENTANYL CITRATE (PF) 250 MCG/5ML IJ SOLN
INTRAMUSCULAR | Status: AC
  Filled 2018-08-06: qty 5

## 2018-08-06 MED ORDER — DEXAMETHASONE SODIUM PHOSPHATE 10 MG/ML IJ SOLN
INTRAMUSCULAR | Status: AC
  Filled 2018-08-06: qty 1

## 2018-08-06 MED ORDER — MORPHINE SULFATE (PF) 4 MG/ML IV SOLN: 4.0000 mg | INTRAVENOUS | Status: DC | PRN

## 2018-08-06 MED ORDER — VANCOMYCIN HCL 1 G IV SOLR
INTRAVENOUS | Status: DC | PRN
  Administered 2018-08-06: 1000 mg

## 2018-08-06 MED ORDER — ROCURONIUM BROMIDE 50 MG/5ML IV SOSY
PREFILLED_SYRINGE | INTRAVENOUS | Status: AC
  Filled 2018-08-06: qty 10

## 2018-08-06 MED ORDER — THROMBIN 5000 UNITS EX SOLR
CUTANEOUS | Status: AC
  Filled 2018-08-06: qty 5000

## 2018-08-06 MED ORDER — 0.9 % SODIUM CHLORIDE (POUR BTL) OPTIME
TOPICAL | Status: DC | PRN
  Administered 2018-08-06: 1000 mL

## 2018-08-06 MED ORDER — ONDANSETRON HCL 4 MG/2ML IJ SOLN: 4.0000 mg | Freq: Once | INTRAMUSCULAR | Status: DC | PRN

## 2018-08-06 MED ORDER — DOCUSATE SODIUM 100 MG PO CAPS
100.0000 mg | ORAL_CAPSULE | Freq: Two times a day (BID) | ORAL | Status: DC
  Administered 2018-08-06 – 2018-08-07 (×2): 100 mg via ORAL
  Filled 2018-08-06 (×2): qty 1

## 2018-08-06 MED ORDER — HYDROCORTISONE NA SUCCINATE PF 250 MG IJ SOLR
INTRAMUSCULAR | Status: AC
  Filled 2018-08-06: qty 250

## 2018-08-06 MED ORDER — CARVEDILOL 12.5 MG PO TABS
12.5000 mg | ORAL_TABLET | Freq: Two times a day (BID) | ORAL | Status: DC
  Administered 2018-08-06 – 2018-08-07 (×2): 12.5 mg via ORAL
  Filled 2018-08-06 (×3): qty 1

## 2018-08-06 MED ORDER — SELEGILINE HCL 5 MG PO TABS
5.0000 mg | ORAL_TABLET | Freq: Two times a day (BID) | ORAL | Status: DC
  Filled 2018-08-06: qty 1

## 2018-08-06 MED ORDER — SODIUM CHLORIDE 0.9% FLUSH: 3.0000 mL | INTRAVENOUS | Status: DC | PRN

## 2018-08-06 MED ORDER — DEXAMETHASONE SODIUM PHOSPHATE 10 MG/ML IJ SOLN
INTRAMUSCULAR | Status: DC | PRN
  Administered 2018-08-06: 10 mg via INTRAVENOUS

## 2018-08-06 MED ORDER — SODIUM CHLORIDE 0.9% FLUSH
3.0000 mL | Freq: Two times a day (BID) | INTRAVENOUS | Status: DC
  Administered 2018-08-06 (×2): 3 mL via INTRAVENOUS

## 2018-08-06 MED ORDER — SELEGILINE HCL 5 MG PO CAPS
5.0000 mg | ORAL_CAPSULE | Freq: Two times a day (BID) | ORAL | Status: DC
  Filled 2018-08-06 (×3): qty 1

## 2018-08-06 MED ORDER — PROPOFOL 10 MG/ML IV BOLUS
INTRAVENOUS | Status: AC
  Filled 2018-08-06: qty 20

## 2018-08-06 MED ORDER — THROMBIN (RECOMBINANT) 20000 UNITS EX SOLR
CUTANEOUS | Status: AC
  Filled 2018-08-06: qty 20000

## 2018-08-06 MED ORDER — ONDANSETRON HCL 4 MG/2ML IJ SOLN
INTRAMUSCULAR | Status: DC | PRN
  Administered 2018-08-06: 4 mg via INTRAVENOUS

## 2018-08-06 MED ORDER — SODIUM CHLORIDE 0.9 % IV SOLN
INTRAVENOUS | Status: DC | PRN
  Administered 2018-08-06: 07:00:00

## 2018-08-06 MED ORDER — SUGAMMADEX SODIUM 500 MG/5ML IV SOLN
INTRAVENOUS | Status: AC
  Filled 2018-08-06: qty 5

## 2018-08-06 MED ORDER — BUPIVACAINE-EPINEPHRINE 0.5% -1:200000 IJ SOLN
INTRAMUSCULAR | Status: AC
  Filled 2018-08-06: qty 1

## 2018-08-06 MED ORDER — BACITRACIN ZINC 500 UNIT/GM EX OINT
TOPICAL_OINTMENT | CUTANEOUS | Status: AC
  Filled 2018-08-06: qty 28.35

## 2018-08-06 MED ORDER — FENTANYL CITRATE (PF) 100 MCG/2ML IJ SOLN: 25.0000 ug | INTRAMUSCULAR | Status: DC | PRN

## 2018-08-06 MED ORDER — MENTHOL 3 MG MT LOZG: 1.0000 | LOZENGE | OROMUCOSAL | Status: DC | PRN

## 2018-08-06 MED ORDER — PANTOPRAZOLE SODIUM 20 MG PO TBEC
20.0000 mg | DELAYED_RELEASE_TABLET | Freq: Every day | ORAL | Status: DC
  Administered 2018-08-06 – 2018-08-07 (×2): 20 mg via ORAL
  Filled 2018-08-06 (×2): qty 1

## 2018-08-06 MED ORDER — ACETAMINOPHEN 500 MG PO TABS
1000.0000 mg | ORAL_TABLET | Freq: Four times a day (QID) | ORAL | Status: DC
  Administered 2018-08-06 – 2018-08-07 (×3): 1000 mg via ORAL
  Filled 2018-08-06 (×4): qty 2

## 2018-08-06 MED ORDER — CEFAZOLIN SODIUM-DEXTROSE 2-4 GM/100ML-% IV SOLN
2.0000 g | Freq: Three times a day (TID) | INTRAVENOUS | Status: AC
  Administered 2018-08-06 (×2): 2 g via INTRAVENOUS
  Filled 2018-08-06 (×2): qty 100

## 2018-08-06 MED ORDER — ONDANSETRON HCL 4 MG PO TABS: 4.0000 mg | ORAL_TABLET | Freq: Four times a day (QID) | ORAL | Status: DC | PRN

## 2018-08-06 MED ORDER — SUGAMMADEX SODIUM 500 MG/5ML IV SOLN
INTRAVENOUS | Status: DC | PRN
  Administered 2018-08-06: 250 mg via INTRAVENOUS

## 2018-08-06 MED ORDER — ALBUMIN HUMAN 5 % IV SOLN
INTRAVENOUS | Status: DC | PRN
  Administered 2018-08-06: 10:00:00 via INTRAVENOUS

## 2018-08-06 MED ORDER — BUPIVACAINE-EPINEPHRINE (PF) 0.5% -1:200000 IJ SOLN
INTRAMUSCULAR | Status: DC | PRN
  Administered 2018-08-06: 10 mL via PERINEURAL

## 2018-08-06 MED ORDER — PROPOFOL 10 MG/ML IV BOLUS
INTRAVENOUS | Status: DC | PRN
  Administered 2018-08-06: 150 mg via INTRAVENOUS

## 2018-08-06 MED ORDER — THROMBIN 20000 UNITS EX SOLR
CUTANEOUS | Status: DC | PRN
  Administered 2018-08-06: 07:00:00 via TOPICAL

## 2018-08-06 MED ORDER — BISACODYL 10 MG RE SUPP: 10.0000 mg | Freq: Every day | RECTAL | Status: DC | PRN

## 2018-08-06 MED ORDER — DICYCLOMINE HCL 20 MG PO TABS
20.0000 mg | ORAL_TABLET | Freq: Three times a day (TID) | ORAL | Status: DC
  Administered 2018-08-07: 20 mg via ORAL
  Filled 2018-08-06 (×3): qty 1

## 2018-08-06 MED ORDER — POLYVINYL ALCOHOL 1.4 % OP SOLN
1.0000 [drp] | Freq: Every day | OPHTHALMIC | Status: DC
  Filled 2018-08-06: qty 15

## 2018-08-06 MED ORDER — VANCOMYCIN HCL 1000 MG IV SOLR
INTRAVENOUS | Status: AC
  Filled 2018-08-06: qty 1000

## 2018-08-06 MED ORDER — PHENOL 1.4 % MT LIQD: 1.0000 | OROMUCOSAL | Status: DC | PRN

## 2018-08-06 MED ORDER — BACITRACIN ZINC 500 UNIT/GM EX OINT
TOPICAL_OINTMENT | CUTANEOUS | Status: DC | PRN
  Administered 2018-08-06: 1 via TOPICAL

## 2018-08-06 MED ORDER — MIDAZOLAM HCL 2 MG/2ML IJ SOLN
INTRAMUSCULAR | Status: AC
  Filled 2018-08-06: qty 2

## 2018-08-06 MED ORDER — PHENYLEPHRINE 40 MCG/ML (10ML) SYRINGE FOR IV PUSH (FOR BLOOD PRESSURE SUPPORT)
PREFILLED_SYRINGE | INTRAVENOUS | Status: AC
  Filled 2018-08-06: qty 10

## 2018-08-06 MED ORDER — TORSEMIDE 20 MG PO TABS
60.0000 mg | ORAL_TABLET | Freq: Two times a day (BID) | ORAL | Status: DC
  Administered 2018-08-06 – 2018-08-07 (×2): 60 mg via ORAL
  Filled 2018-08-06 (×2): qty 3

## 2018-08-06 MED ORDER — HYDROXYZINE HCL 50 MG/ML IM SOLN
50.0000 mg | Freq: Four times a day (QID) | INTRAMUSCULAR | Status: DC | PRN
  Administered 2018-08-06: 50 mg via INTRAMUSCULAR
  Filled 2018-08-06: qty 1

## 2018-08-06 MED ORDER — ACETAMINOPHEN 650 MG RE SUPP: 650.0000 mg | RECTAL | Status: DC | PRN

## 2018-08-06 MED ORDER — BUPIVACAINE LIPOSOME 1.3 % IJ SUSP
20.0000 mL | INTRAMUSCULAR | Status: AC
  Administered 2018-08-06: 20 mL
  Filled 2018-08-06: qty 20

## 2018-08-06 MED ORDER — ACETAMINOPHEN 325 MG PO TABS: 650.0000 mg | ORAL_TABLET | ORAL | Status: DC | PRN

## 2018-08-06 MED ORDER — EPHEDRINE 5 MG/ML INJ
INTRAVENOUS | Status: AC
  Filled 2018-08-06: qty 10

## 2018-08-06 MED ORDER — PHENYLEPHRINE 40 MCG/ML (10ML) SYRINGE FOR IV PUSH (FOR BLOOD PRESSURE SUPPORT)
PREFILLED_SYRINGE | INTRAVENOUS | Status: DC | PRN
  Administered 2018-08-06: 80 ug via INTRAVENOUS
  Administered 2018-08-06: 60 ug via INTRAVENOUS
  Administered 2018-08-06: 100 ug via INTRAVENOUS

## 2018-08-06 SURGICAL SUPPLY — 72 items
BAG DECANTER FOR FLEXI CONT (MISCELLANEOUS) ×3 IMPLANT
BASKET BONE COLLECTION (BASKET) ×3 IMPLANT
BENZOIN TINCTURE PRP APPL 2/3 (GAUZE/BANDAGES/DRESSINGS) ×3 IMPLANT
BLADE CLIPPER SURG (BLADE) IMPLANT
BUR MATCHSTICK NEURO 3.0 LAGG (BURR) ×3 IMPLANT
BUR PRECISION FLUTE 6.0 (BURR) ×3 IMPLANT
CAGE ALTERA 10X31MM-10-14-15 (Cage) ×1 IMPLANT
CAGE ALTERA 10X31X10-14 15D (Cage) ×2 IMPLANT
CANISTER SUCT 3000ML PPV (MISCELLANEOUS) ×3 IMPLANT
CAP REVERE LOCKING (Cap) ×12 IMPLANT
CARTRIDGE OIL MAESTRO DRILL (MISCELLANEOUS) ×1 IMPLANT
CLOSURE WOUND 1/2 X4 (GAUZE/BANDAGES/DRESSINGS) ×1
CONT SPEC 4OZ CLIKSEAL STRL BL (MISCELLANEOUS) ×3 IMPLANT
COVER BACK TABLE 60X90IN (DRAPES) ×3 IMPLANT
COVER WAND RF STERILE (DRAPES) ×3 IMPLANT
DECANTER SPIKE VIAL GLASS SM (MISCELLANEOUS) ×3 IMPLANT
DERMABOND ADVANCED (GAUZE/BANDAGES/DRESSINGS) ×2
DERMABOND ADVANCED .7 DNX12 (GAUZE/BANDAGES/DRESSINGS) ×1 IMPLANT
DIFFUSER DRILL AIR PNEUMATIC (MISCELLANEOUS) ×3 IMPLANT
DRAPE C-ARM 42X72 X-RAY (DRAPES) ×6 IMPLANT
DRAPE HALF SHEET 40X57 (DRAPES) ×6 IMPLANT
DRAPE LAPAROTOMY 100X72X124 (DRAPES) ×3 IMPLANT
DRAPE SURG 17X23 STRL (DRAPES) ×12 IMPLANT
DRSG OPSITE POSTOP 4X6 (GAUZE/BANDAGES/DRESSINGS) ×3 IMPLANT
ELECT BLADE 4.0 EZ CLEAN MEGAD (MISCELLANEOUS) ×3
ELECT REM PT RETURN 9FT ADLT (ELECTROSURGICAL) ×3
ELECTRODE BLDE 4.0 EZ CLN MEGD (MISCELLANEOUS) ×1 IMPLANT
ELECTRODE REM PT RTRN 9FT ADLT (ELECTROSURGICAL) ×1 IMPLANT
EVACUATOR 1/8 PVC DRAIN (DRAIN) IMPLANT
GAUZE 4X4 16PLY RFD (DISPOSABLE) ×6 IMPLANT
GAUZE SPONGE 4X4 12PLY STRL (GAUZE/BANDAGES/DRESSINGS) ×3 IMPLANT
GLOVE BIO SURGEON STRL SZ8 (GLOVE) ×6 IMPLANT
GLOVE BIO SURGEON STRL SZ8.5 (GLOVE) ×6 IMPLANT
GLOVE EXAM NITRILE LRG STRL (GLOVE) IMPLANT
GLOVE EXAM NITRILE XL STR (GLOVE) IMPLANT
GLOVE EXAM NITRILE XS STR PU (GLOVE) IMPLANT
GLOVE SURG SS PI 7.5 STRL IVOR (GLOVE) ×3 IMPLANT
GOWN STRL REUS W/ TWL LRG LVL3 (GOWN DISPOSABLE) ×1 IMPLANT
GOWN STRL REUS W/ TWL XL LVL3 (GOWN DISPOSABLE) ×2 IMPLANT
GOWN STRL REUS W/TWL 2XL LVL3 (GOWN DISPOSABLE) ×6 IMPLANT
GOWN STRL REUS W/TWL LRG LVL3 (GOWN DISPOSABLE) ×2
GOWN STRL REUS W/TWL XL LVL3 (GOWN DISPOSABLE) ×4
HEMOSTAT POWDER KIT SURGIFOAM (HEMOSTASIS) ×3 IMPLANT
KIT BASIN OR (CUSTOM PROCEDURE TRAY) ×3 IMPLANT
KIT TURNOVER KIT B (KITS) ×3 IMPLANT
MILL MEDIUM DISP (BLADE) ×3 IMPLANT
NEEDLE HYPO 21X1.5 SAFETY (NEEDLE) ×3 IMPLANT
NEEDLE HYPO 22GX1.5 SAFETY (NEEDLE) ×3 IMPLANT
NS IRRIG 1000ML POUR BTL (IV SOLUTION) ×3 IMPLANT
OIL CARTRIDGE MAESTRO DRILL (MISCELLANEOUS) ×3
PACK LAMINECTOMY NEURO (CUSTOM PROCEDURE TRAY) ×3 IMPLANT
PAD ARMBOARD 7.5X6 YLW CONV (MISCELLANEOUS) ×9 IMPLANT
PATTIES SURGICAL .5 X1 (DISPOSABLE) IMPLANT
PATTIES SURGICAL 1X1 (DISPOSABLE) ×3 IMPLANT
PENCIL BUTTON HOLSTER BLD 10FT (ELECTRODE) ×3 IMPLANT
ROD REVERE 6.35 40MM (Rod) ×6 IMPLANT
SCREW 7.5X50MM (Screw) ×3 IMPLANT
SCREW REVERE 6.35 75X55MM (Screw) ×9 IMPLANT
SLEEVE SURGEON STRL (DRAPES) ×3 IMPLANT
SPONGE LAP 4X18 RFD (DISPOSABLE) ×3 IMPLANT
SPONGE NEURO XRAY DETECT 1X3 (DISPOSABLE) IMPLANT
SPONGE SURGIFOAM ABS GEL 100 (HEMOSTASIS) ×3 IMPLANT
STRIP BIOACTIVE 20CC 25X100X8 (Miscellaneous) ×3 IMPLANT
STRIP CLOSURE SKIN 1/2X4 (GAUZE/BANDAGES/DRESSINGS) ×2 IMPLANT
SUT VIC AB 1 CT1 18XBRD ANBCTR (SUTURE) ×1 IMPLANT
SUT VIC AB 1 CT1 8-18 (SUTURE) ×2
SUT VIC AB 2-0 CP2 18 (SUTURE) ×6 IMPLANT
SYR 20CC LL (SYRINGE) ×3 IMPLANT
TOWEL GREEN STERILE (TOWEL DISPOSABLE) ×3 IMPLANT
TOWEL GREEN STERILE FF (TOWEL DISPOSABLE) ×3 IMPLANT
TRAY FOLEY MTR SLVR 16FR STAT (SET/KITS/TRAYS/PACK) ×3 IMPLANT
WATER STERILE IRR 1000ML POUR (IV SOLUTION) ×3 IMPLANT

## 2018-08-06 NOTE — H&P (Signed)
Subjective: The patient is a 72 year old white male with Parkinson's disease and a brain stimulator atrial fibrillation, anticoagulation, who has complained of severe back pain.  He has failed medical management.  He was worked up with a lumbar  CT which demonstrated an L4-5 spinal listhesis with severe stenosis.  I discussed the various treatment option with the patient is wife.  He has decided proceed with surgery.  Past Medical History:  Diagnosis Date  . Atrial fibrillation ([place])   . CHF (congestive heart failure) ([place])   . CKD (chronic kidney disease)   . Complete heart block ([place])    a. s/p St. Jude PPM 11/2014.  [name] Complication of anesthesia    stayed under too long d/t taking Parkinson's medication  . Coronary artery disease   . GERD (gastroesophageal reflux disease)    "not as bad as it used to be"  . Heart murmur   . Hyperlipidemia   . Hypertension   . Parkinson's disease ([place])   . Presence of permanent cardiac pacemaker    St Jude  . Shortness of breath dyspnea     Past Surgical History:  Procedure Laterality Date  . APPENDECTOMY  12/2010  . CARDIAC CATHETERIZATION  11/20/2012   Known CAD with one vessel coronary disease of left descending artery by cardiac cath.   [name] CARDIOVERSION N/A 11/21/2016   Procedure: CARDIOVERSION;  Surgeon: Deboraha Sprang, MD;  Location: [facility] ORS;  Service: Cardiovascular;  Laterality: N/A;  . COLONOSCOPY  2018  . INSERT / REPLACE / REMOVE PACEMAKER    . PACEMAKER INSERTION  11-21-14   STJ Assurity dual chamber pacemaker implanted by Dr Rayann Heman for CHB  . PERMANENT PACEMAKER INSERTION N/A 11/21/2014   Procedure: PERMANENT PACEMAKER INSERTION;  Surgeon: Thompson Grayer, MD;  Location: [facility] CATH LAB;  Service: Cardiovascular;  Laterality: N/A;  . TEE WITHOUT CARDIOVERSION N/A 11/21/2016   Procedure: TRANSESOPHAGEAL ECHOCARDIOGRAM (TEE);  Surgeon: Deboraha Sprang, MD;  Location: [facility] ORS;  Service: Cardiovascular;  Laterality: N/A;  . UPPER GI ENDOSCOPY       Allergies  Allergen Reactions  . Linzess [Linaclotide] Shortness Of Breath  . Amitiza [Lubiprostone] Other (See Comments)    Abdominal cramps  . Codeine Diarrhea and Nausea And Vomiting  . Norco [Hydrocodone-Acetaminophen] Diarrhea and Nausea And Vomiting  . Omeprazole Diarrhea and Nausea And Vomiting  . Requip  [Ropinirole] Nausea Only    Social History   Tobacco Use  . Smoking status: Never Smoker  . Smokeless tobacco: Never Used  Substance Use Topics  . Alcohol use: No    Alcohol/week: 0.0 standard drinks    Family History  Problem Relation Age of Onset  . Heart disease Mother   . Heart disease Father   . Colon cancer Father   . Stroke Sister    Prior to Admission medications   Medication Sig Start Date End Date Taking? Authorizing Provider  allopurinol (ZYLOPRIM) 300 MG tablet Take 300 mg by mouth daily.  05/28/18  Yes [provider]  carboxymethylcellulose (REFRESH PLUS) 0.5 % SOLN Place 1 drop into both eyes daily.   Yes [provider]  carvedilol (COREG) 12.5 MG tablet TAKE 1 TABLET (12.5 MG TOTAL) BY MOUTH 2 (TWO) TIMES DAILY. 05/09/18 08/07/18 Yes Gollan, Kathlene November, MD  desloratadine (CLARINEX) 5 MG tablet Take 5 mg by mouth daily as needed (allergies).   Yes [provider]  dicyclomine (BENTYL) 20 MG tablet Take 20 mg by mouth 3 (three) times daily with meals.  04/04/13  Yes [provider]  ELIQUIS 5 MG TABS tablet TAKE 1 TABLET BY MOUTH TWICE A DAY Patient taking differently: Take 5 mg by mouth 2 (two) times daily.  05/07/18  Yes Gollan, Kathlene November, MD  fluticasone (FLONASE) 50 MCG/ACT nasal spray Place 2 sprays into the nose daily as needed for allergies.    Yes [provider]  lansoprazole (PREVACID) 15 MG capsule Take 15 mg by mouth as needed (indigestion).    Yes [provider]  lisinopril (PRINIVIL,ZESTRIL) 40 MG tablet Take 1 tablet (40 mg total) by mouth daily. Patient taking differently: Take 40 mg by  mouth at bedtime.  11/20/17  Yes Hackney, Tina A, FNP  meloxicam (MOBIC) 15 MG tablet Take 15 mg by mouth daily.    Yes [provider]  metolazone (ZAROXOLYN) 2.5 MG tablet TAKE 1 TABLET BY MOUTH DAILY AS NEEDED (ONCE DAILY AS NEEDED FOR WEIGHT GAIN OR SWELLING.). Patient taking differently: Take 2.5 mg by mouth daily as needed (swelling/weight gain).  06/25/18  Yes Gollan, Kathlene November, MD  polyethylene glycol (MIRALAX / GLYCOLAX) packet Take 17 g by mouth daily as needed for moderate constipation.    Yes [provider]  potassium chloride (K-DUR) 10 MEQ tablet TAKE 3 TABLETS BY MOUTH TWICE A DAY Patient taking differently: Take 30 mEq by mouth 2 (two) times daily.  05/09/18  Yes Gollan, Kathlene November, MD  pramipexole (MIRAPEX) 0.5 MG tablet Take 0.5 mg by mouth 3 (three) times daily.    Yes [provider]  predniSONE (DELTASONE) 5 MG tablet Take 5 mg by mouth daily with breakfast.  04/16/18  Yes [provider]  selegiline (ELDEPRYL) 5 MG capsule Take 5 mg by mouth 2 (two) times daily with a meal.    Yes [provider]  torsemide (DEMADEX) 20 MG tablet TAKE 3 TABLETS (60 MG TOTAL) BY MOUTH 2 (TWO) TIMES DAILY. 05/15/18  Yes Minna Merritts, MD     Review of Systems  Positive ROS: As above  All other systems have been reviewed and were otherwise negative with the exception of those mentioned in the HPI and as above.  Objective: Vital signs in last 24 hours: Temp:  [98.2 F (36.8 C)] 98.2 F (36.8 C) (10/28 0614) Pulse Rate:  [63] 63 (10/28 0614) Resp:  [20] 20 (10/28 0614) BP: (148)/(64) 148/64 (10/28 0614) SpO2:  [97 %] 97 % (10/28 0614) Estimated body mass index is 36.06 kg/m as calculated from the following:   Height as of 07/31/18: 6\' 1"  (1.854 m).   Weight as of 07/31/18: 124 kg.   General Appearance: Alert Head: Normocephalic, without obvious abnormality, atraumatic Eyes: PERRL, conjunctiva/corneas clear, EOM's intact,    Ears: Normal   Throat: Normal  Neck: Supple, Back: unremarkable Lungs: Clear to auscultation bilaterally, respirations unlabored Heart: Regular rate and rhythm, no murmur, rub or gallop Abdomen: Soft, non-tender Extremities: Extremities normal, atraumatic, no cyanosis or edema Skin: unremarkable  NEUROLOGIC:   Mental status: alert and oriented,Motor Exam - grossly normal, the patient has a tremor. Sensory Exam - grossly normal Reflexes:  Coordination - grossly normal Gait - grossly normal Balance - grossly normal Cranial Nerves: I: smell Not tested  II: visual acuity  OS: Normal  OD: Normal   II: visual fields Full to confrontation  II: pupils Equal, round, reactive to light  III,VII: ptosis None  III,IV,VI: extraocular muscles  Full ROM  V: mastication Normal  V: facial light touch sensation  Normal  V,VII: corneal reflex  Present  VII: facial muscle function - upper  Normal  VII: facial muscle function - lower Normal  VIII: hearing Not tested  IX: soft palate elevation  Normal  IX,X: gag reflex Present  XI: trapezius strength  5/5  XI: sternocleidomastoid strength 5/5  XI: neck flexion strength  5/5  XII: tongue strength  Normal    Data Review Lab Results  Component Value Date   WBC 10.2 07/31/2018   HGB 11.6 (L) 07/31/2018   HCT 37.4 (L) 07/31/2018   MCV 95.4 07/31/2018   PLT 272 07/31/2018   Lab Results  Component Value Date   NA 141 07/31/2018   K 3.9 07/31/2018   CL 107 07/31/2018   CO2 26 07/31/2018   BUN 38 (H) 07/31/2018   CREATININE 1.65 (H) 07/31/2018   GLUCOSE 117 (H) 07/31/2018   Lab Results  Component Value Date   INR 1.04 08/06/2018    Assessment/Plan: L4-5 spondylolisthesis, spinal stenosis, lumbago, neurogenic claudication: I have discussed situation with the patient and his wife.  I reviewed his imaging studies with him and pointed out the abnormalities.  We have discussed the various treatment options including surgery.  I have described the  surgical treatment option at L4-5 decompression, instrumentation, and fusion.  I have given him a surgical pamphlet.  I have shown him surgical models.  We have discussed the risks, benefits, alternatives, expected postoperative course, and likelihood of achieving our goals with surgery.  I have answered all their questions.  He has decided to proceed with surgery.   Ophelia Charter 08/06/2018 7:15 AM

## 2018-08-06 NOTE — Anesthesia Postprocedure Evaluation (Signed)
Anesthesia Post Note  Patient: Matthew Howe  Procedure(s) Performed: POSTERIOR LUMBAR INTERBODY FUSION, INTERBODY PROSTHESIS, POSTERIOR INSTRUMENTATION AND FUSION LUMBAR FOUR LUMBAR FIVE (N/A )     Patient location during evaluation: PACU Anesthesia Type: General Level of consciousness: awake and alert Pain management: pain level controlled Vital Signs Assessment: post-procedure vital signs reviewed and stable Respiratory status: spontaneous breathing, nonlabored ventilation, respiratory function stable and patient connected to nasal cannula oxygen Cardiovascular status: blood pressure returned to baseline and stable Postop Assessment: no apparent nausea or vomiting Anesthetic complications: no    Last Vitals:  Vitals:   08/06/18 1340 08/06/18 1629  BP: 131/72 (!) 147/66  Pulse: 70 62  Resp: 16   Temp:    SpO2: 95% 98%    Last Pain:  Vitals:   08/06/18 1840  TempSrc:   PainSc: 2                  Tiajuana Amass

## 2018-08-06 NOTE — Evaluation (Addendum)
Occupational Therapy Evaluation Patient Details Name: Matthew Howe MRN: [id number] DOB: [birth year] Today's Date: 08/06/2018    History of Present Illness 72 year old white male with Parkinson's disease, atrial fibrillation, anticoagulated, CHF, CAD, GERD, HTN, and pacemaker placement (2016). S/p Bilateral L4-5 laminotomy/foraminotomies/medial facetectomy.    Clinical Impression   PTA, pt was living with his wife and was independent. Pt currently requiring Min A for LB ADLs with AE and Min A +2 for functional transfers with RW. Pt presenting with decreased balance, strength, and activity tolerance becoming nauseous at end of session. BP 147/66 and SpO2 98 on RA; RN notified. Pt would benefit from further acute OT to address LB ADLs and functional transfer. Recommend dc to home with HHOT for further OT to optimize safety, independence with ADLs, and return to PLOF.      Follow Up Recommendations  Home health OT;Supervision/Assistance - 24 hour    Equipment Recommendations  3 in 1 bedside commode    Recommendations for Other Services PT consult     Precautions / Restrictions Precautions Precautions: Back Precaution Booklet Issued: Yes (comment) Precaution Comments: Providing education on back precautions. Required Braces or Orthoses: Spinal Brace Spinal Brace: Lumbar corset Restrictions Weight Bearing Restrictions: No      Mobility Bed Mobility Overal bed mobility: Needs Assistance Bed Mobility: Rolling;Sidelying to Sit Rolling: Min assist Sidelying to sit: Mod assist;+2 for safety/equipment       General bed mobility comments: Min A to roll to right side. Min A to bring BLEs over EOB and then Mod A to elevate trunk  Transfers Overall transfer level: Needs assistance Equipment used: Rolling walker (2 wheeled) Transfers: Sit to/from Stand Sit to Stand: Min assist;+2 safety/equipment;From elevated surface         General transfer comment: Min A for power up and then  to gain balance    Balance Overall balance assessment: Needs assistance Sitting-balance support: No upper extremity supported;Feet supported Sitting balance-Leahy Scale: Fair     Standing balance support: Bilateral upper extremity supported;During functional activity Standing balance-Leahy Scale: Fair Standing balance comment: Able to maintain static standing without UE support                           ADL either performed or assessed with clinical judgement   ADL Overall ADL's : Needs assistance/impaired Eating/Feeding: Set up;Sitting   Grooming: Set up;Supervision/safety;Sitting Grooming Details (indicate cue type and reason): Providing education on using second cup at sink for oral care Upper Body Bathing: Minimal assistance;Sitting   Lower Body Bathing: Minimal assistance;Sit to/from stand;With adaptive equipment   Upper Body Dressing : Minimal assistance;With caregiver independent assisting;Sitting Upper Body Dressing Details (indicate cue type and reason): Educating pt on donning brace.  Lower Body Dressing: Minimal assistance;With adaptive equipment;Sit to/from stand Lower Body Dressing Details (indicate cue type and reason): Providing eduation on use of reacher for donning underwear. Pt requiring Min A for managing reacher and Min A for standing balance Toilet Transfer: Minimal assistance;+2 for safety/equipment(sit steps at EOB)           Functional mobility during ADLs: Minimal assistance;+2 for safety/equipment;Rolling walker(side steps at EOB) General ADL Comments: Pt demosntrating decreased strength, balance, and actvity tolerance. Pt becoming nauseous at end of session     Vision         Perception     Praxis      Pertinent Vitals/Pain Pain Assessment: Faces Faces Pain Scale: Hurts even more Pain Location:  Back Pain Descriptors / Indicators: Constant;Discomfort;Grimacing Pain Intervention(s): Monitored during session;Repositioned      Hand Dominance Right(Tremors in right)   Extremity/Trunk Assessment Upper Extremity Assessment Upper Extremity Assessment: RUE deficits/detail RUE Deficits / Details: tremor   Lower Extremity Assessment Lower Extremity Assessment: Defer to PT evaluation   Cervical / Trunk Assessment Cervical / Trunk Assessment: Other exceptions Cervical / Trunk Exceptions: s/p back sx   Communication Communication Communication: No difficulties   Cognition Arousal/Alertness: Awake/alert Behavior During Therapy: WFL for tasks assessed/performed Overall Cognitive Status: Within Functional Limits for tasks assessed                                     General Comments  Wife and brother in law present during session    Exercises     Shoulder Instructions      Home Living Family/patient expects to be discharged to:: Private residence Living Arrangements: Spouse/significant other Available Help at Discharge: Family;Available 24 hours/day Type of Home: House Home Access: Stairs to enter [employer] of Steps: 3 Entrance Stairs-Rails: None Home Layout: Two level;Able to live on main level with bedroom/bathroom Alternate Level Stairs-Number of Steps: 16   Bathroom Shower/Tub: Tub/shower unit;Curtain   Biochemist, clinical: Standard     Home Equipment: Environmental consultant - 2 wheels;Cane - single point          Prior Functioning/Environment Level of Independence: Independent                 OT Problem List: Decreased strength;Decreased range of motion;Decreased activity tolerance;Impaired balance (sitting and/or standing);Decreased safety awareness;Decreased knowledge of use of DME or AE;Decreased knowledge of precautions;Pain      OT Treatment/Interventions: Self-care/ADL training;Therapeutic exercise;DME and/or AE instruction;Energy conservation;Therapeutic activities;Patient/family education    OT Goals(Current goals can be found in the care plan section) Acute  Rehab OT Goals Patient Stated Goal: "Feel better" OT Goal Formulation: With patient/family Time For Goal Achievement: 08/20/18 Potential to Achieve Goals: Good  OT Frequency: Min 2X/week   Barriers to D/C:            Co-evaluation              AM-PAC PT "6 Clicks" Daily Activity     Outcome Measure Help from another person eating meals?: None Help from another person taking care of personal grooming?: A Little Help from another person toileting, which includes using toliet, bedpan, or urinal?: A Little Help from another person bathing (including washing, rinsing, drying)?: A Little Help from another person to put on and taking off regular upper body clothing?: A Little Help from another person to put on and taking off regular lower body clothing?: A Little 6 Click Score: 19   End of Session Equipment Utilized During Treatment: Rolling walker Nurse Communication: Mobility status;Precautions  Activity Tolerance: Patient tolerated treatment well Patient left: in bed;with call bell/phone within reach;with family/visitor present(EOB)  OT Visit Diagnosis: Unsteadiness on feet (R26.81);Other abnormalities of gait and mobility (R26.89);Muscle weakness (generalized) (M62.81);Pain Pain - part of body: (Back)                Time: 8937-3428 OT Time Calculation (min): 27 min Charges:  OT General Charges $OT Visit: 1 Visit OT Evaluation $OT Eval Moderate Complexity: [place], OTR/L Acute Rehab Pager: [phone] Office: [place] 08/06/2018, 5:33 PM

## 2018-08-06 NOTE — Op Note (Signed)
Brief history: The patient is a 72 year old white male with Parkinson's disease, atrial fibrillation, anticoagulated, etc. who has complained of back and leg pain consistent with neurogenic claudication.  He has failed medical management and was worked up with a lumbar CT.  This demonstrated an L4-5 spondylolisthesis with severe stenosis.  I discussed the various treatment option with the patient and his wife.  He has weighed the risks, benefits, and alternatives of surgery and decided proceed with a lumbar decompression, his rotation, and fusion.  Preoperative diagnosis: L4-5 spondylolisthesis, degenerative disc disease, spinal stenosis compressing both the L4 and the L5 nerve roots; lumbago; lumbar radiculopathy; neurogenic claudication  Postoperative diagnosis: The same  Procedure: Bilateral L4-5 laminotomy/foraminotomies/medial facetectomy to decompress the bilateral L4 and L5 nerve roots(the work required to do this was in addition to the work required to do the posterior lumbar interbody fusion because of the patient's spinal stenosis, facet arthropathy. Etc. requiring a wide decompression of the nerve roots.);  L4-5 transforaminal lumbar interbody fusion with local morselized autograft bone and Kinnex graft extender; insertion of interbody prosthesis at L4-5 (globus peek expandable interbody prosthesis); posterior nonsegmental instrumentation from L4 to L5 with globus titanium pedicle screws and rods; posterior lateral arthrodesis at L4-5 with local morselized autograft bone and Kinnex bone graft extender.  Surgeon: Dr. Earle Gell  Asst.: Dr. Deatra Ina and Arnetha Massy nurse practitioner  Anesthesia: Gen. endotracheal  Estimated blood loss: 200 cc  Drains: None  Complications: None  Description of procedure: The patient was brought to the operating room by the anesthesia team. General endotracheal anesthesia was induced. The patient was turned to the prone position on the Wilson  frame. The patient's lumbosacral region was then prepared with Betadine scrub and Betadine solution. Sterile drapes were applied.  I then injected the area to be incised with Marcaine with epinephrine solution. I then used the scalpel to make a linear midline incision over the L4-5 interspace. I then used electrocautery to perform a bilateral subperiosteal dissection exposing the spinous process and lamina of L4 and L5. We then obtained intraoperative radiograph to confirm our location. We then inserted the Verstrac retractor to provide exposure.  I began the decompression by using the high speed drill to perform laminotomies at L4-5 bilaterally. We then used the Kerrison punches to widen the laminotomy and removed the ligamentum flavum at L4-5 bilaterally. We used the Kerrison punches to remove the medial facets at L4-5 bilaterally. We performed wide foraminotomies about the bilateral L4 and L5 nerve roots completing the decompression.  We now turned our attention to the posterior lumbar interbody fusion. I used a scalpel to incise the intervertebral disc at L4-5 bilaterally. I then performed a partial intervertebral discectomy at L4-5 bilaterally using the pituitary forceps. We prepared the vertebral endplates at L4 and L5 for the fusion by removing the soft tissues with the curettes. We then used the trial spacers to pick the appropriate sized interbody prosthesis. We prefilled his prosthesis with a combination of local morselized autograft bone that we obtained during the decompression as well as Kinnex bone graft extender. We inserted the prefilled prosthesis into the interspace at L4-5 from the right, we then turned and expanded the prosthesis. There was a good snug fit of the prosthesis in the interspace. We then filled and the remainder of the intervertebral disc space with local morselized autograft bone and Kinnex. This completed the posterior lumbar interbody arthrodesis.  We now turned attention  to the instrumentation. Under fluoroscopic guidance we cannulated  the bilateral L4 and L5 pedicles with the bone probe. We then removed the bone probe. We then tapped the pedicle with a 6.5 millimeter tap. We then removed the tap. We probed inside the tapped pedicle with a ball probe to rule out cortical breaches. We then inserted a 7.5 x 50 and 55 millimeter pedicle screw into the L4 and L5 pedicles bilaterally under fluoroscopic guidance. We then palpated along the medial aspect of the pedicles to rule out cortical breaches. There were none. The nerve roots were not injured. We then connected the unilateral pedicle screws with a lordotic rod. We compressed the construct and secured the rod in place with the caps. We then tightened the caps appropriately. This completed the instrumentation from L4-5 bilaterally.  We now turned our attention to the posterior lateral arthrodesis at L4-5 bilaterally. We used the high-speed drill to decorticate the remainder of the facets, pars, transverse process at L4-5 bilaterally. We then applied a combination of local morselized autograft bone and Kinnex bone graft extender over these decorticated posterior lateral structures. This completed the posterior lateral arthrodesis.  We then obtained hemostasis using bipolar electrocautery. We irrigated the wound out with bacitracin solution. We inspected the thecal sac and nerve roots and noted they were well decompressed. We then removed the retractor. We placed vancomycin powder in the wound.  We injected Exparel . We reapproximated patient's thoracolumbar fascia with interrupted #1 Vicryl suture. We reapproximated patient's subcutaneous tissue with interrupted 2-0 Vicryl suture. The reapproximated patient's skin with Steri-Strips and benzoin. The wound was then coated with bacitracin ointment. A sterile dressing was applied. The drapes were removed. The patient was subsequently returned to the supine position where they were  extubated by the anesthesia team. He was then transported to the post anesthesia care unit in stable condition. All sponge instrument and needle counts were reportedly correct at the end of this case.

## 2018-08-06 NOTE — Anesthesia Procedure Notes (Signed)
Procedure Name: Intubation Date/Time: 08/06/2018 7:38 AM Performed by: Raenette Rover, CRNA Pre-anesthesia Checklist: Patient identified, Emergency Drugs available, Suction available and Patient being monitored Patient Re-evaluated:Patient Re-evaluated prior to induction Oxygen Delivery Method: Circle system utilized Preoxygenation: Pre-oxygenation with 100% oxygen Induction Type: IV induction Ventilation: Mask ventilation without difficulty Laryngoscope Size: Miller and 3 Grade View: Grade II Tube type: Oral Tube size: 7.5 mm Number of attempts: 2 Airway Equipment and Method: Stylet Placement Confirmation: ETT inserted through vocal cords under direct vision,  positive ETCO2,  CO2 detector and breath sounds checked- equal and bilateral Secured at: 22 cm Tube secured with: Tape Dental Injury: Teeth and Oropharynx as per pre-operative assessment  Comments: DL x1 with MAC 4--Grade 2 view. Unsuccessful to pass ETT. Easy mask ventilation.  DL x1 with Sabra Heck 3--Grade 2 view. Successful to pass ETT. No issues.

## 2018-08-06 NOTE — Progress Notes (Signed)
08/06/18 1820  PT Visit Information  Last PT Received On 08/06/18  Assistance Needed +1  PT/OT/SLP Co-Evaluation/Treatment Yes  Reason for Co-Treatment To address functional/ADL transfers  PT goals addressed during session Mobility/safety with mobility;Balance;Proper use of DME  History of Present Illness 72 year old white male with Parkinson's disease, atrial fibrillation, anticoagulated, CHF, CAD, GERD, HTN, and pacemaker placement (2016). S/p Bilateral L4-5 laminotomy/foraminotomies/medial facetectomy.   Precautions  Precautions Back  Precaution Booklet Issued Yes (comment)  Precaution Comments Provided education on back precautions. Demonstrated how to apply lumbar corset as well.   Required Braces or Orthoses Spinal Brace  Spinal Brace Lumbar corset  Restrictions  Weight Bearing Restrictions No  Home Living  Family/patient expects to be discharged to: Private residence  Living Arrangements Spouse/significant other  Available Help at Discharge Family;Available 24 hours/day  Type of Home House  Home Access Stairs to enter  Entrance Stairs-Number of Steps 3  Entrance Stairs-Rails None  Home Layout Two level;Able to live on main level with bedroom/bathroom  Alternate Level Stairs-Number of Steps 16  Bathroom Shower/Tub Tub/shower unit;Curtain  Tax adviser - 2 wheels;Cane - single point  Prior Function  Level of Independence Independent with assistive device(s)  Comments Used cane for ambulation   Communication  Communication No difficulties  Pain Assessment  Pain Assessment Faces  Faces Pain Scale 6  Pain Location Back  Pain Descriptors / Indicators Constant;Discomfort;Grimacing  Pain Intervention(s) Limited activity within patient's tolerance;Monitored during session;Repositioned  Cognition  Arousal/Alertness Awake/alert  Behavior During Therapy WFL for tasks assessed/performed  Overall Cognitive Status Within Functional Limits for  tasks assessed  Upper Extremity Assessment  Upper Extremity Assessment Defer to OT evaluation  RUE Deficits / Details tremor in RUE   Lower Extremity Assessment  Lower Extremity Assessment Generalized weakness  Cervical / Trunk Assessment  Cervical / Trunk Assessment Other exceptions  Cervical / Trunk Exceptions s/p back sx  Bed Mobility  Overal bed mobility Needs Assistance  Bed Mobility Rolling;Sidelying to Sit  Rolling Min assist  Sidelying to sit Mod assist;+2 for safety/equipment  General bed mobility comments Min A to roll to right side. Min A to bring BLEs over EOB and then Mod A to elevate trunk  Transfers  Overall transfer level Needs assistance  Equipment used Rolling walker (2 wheeled);None  Transfers Sit to/from Stand  Sit to [employer] safety/equipment;From elevated surface  General transfer comment Min A for power up and then to gain balance. Performed X2 with and without RW.   Ambulation/Gait  Ambulation/Gait assistance Min assist  Assistive device Rolling walker (2 wheeled)  General Gait Details Performed side steps at EOB with min A and use of RW. Further mobility limited secondary to increased nausea. Vitals checked and WFL. Educated about use of RW at home to increase safety. Educated about generalized walking program to perform at home.   Balance  Overall balance assessment Needs assistance  Sitting-balance support No upper extremity supported;Feet supported  Sitting balance-Leahy Scale Fair  Standing balance support Bilateral upper extremity supported;During functional activity  Standing balance-Leahy Scale Fair  Standing balance comment Able to maintain static standing without UE support  General Comments  General comments (skin integrity, edema, etc.) Wife and brother in law present during session  PT - End of Session  Activity Tolerance Treatment limited secondary to medical complications (Comment) (nausea)  Patient left in bed;with call  bell/phone within reach;with family/visitor present (sitting EOB )  Nurse Communication Mobility status;Other (comment) (nausea )  PT Assessment  PT Recommendation/Assessment Patient needs continued PT services  PT Visit Diagnosis Unsteadiness on feet (R26.81);Muscle weakness (generalized) (M62.81);Pain  Pain - part of body  (back )  PT Problem List Decreased strength;Decreased balance;Decreased activity tolerance;Decreased mobility;Decreased knowledge of use of DME;Decreased knowledge of precautions;Pain  PT Plan  PT Frequency (ACUTE ONLY) Min 5X/week  PT Treatment/Interventions (ACUTE ONLY) DME instruction;Gait training;Functional mobility training;Stair training;Therapeutic exercise;Therapeutic activities;Balance training;Patient/family education  AM-PAC PT "6 Clicks" Daily Activity Outcome Measure  Difficulty turning over in bed (including adjusting bedclothes, sheets and blankets)? 1  Difficulty moving from lying on back to sitting on the side of the bed?  1  Difficulty sitting down on and standing up from a chair with arms (e.g., wheelchair, bedside commode, etc,.)? 1  Help needed moving to and from a bed to chair (including a wheelchair)? 3  Help needed walking in hospital room? 3  Help needed climbing 3-5 steps with a railing?  2  6 Click Score 11  Mobility G Code  CL  PT Recommendation  Follow Up Recommendations Home health PT;Supervision for mobility/OOB  PT equipment Rolling walker with 5" wheels  Individuals Consulted  Consulted and Agree with Results and Recommendations Patient;Family member/caregiver  Family Member Consulted wife  Acute Rehab PT Goals  Patient Stated Goal "Feel better"  PT Goal Formulation With patient  Time For Goal Achievement 08/20/18  Potential to Achieve Goals Good  PT Time Calculation  PT Start Time (ACUTE ONLY) 1622  PT Stop Time (ACUTE ONLY) 1644  PT Time Calculation (min) (ACUTE ONLY) 22 min  PT General Charges  $$ ACUTE PT VISIT 1 Visit   PT Evaluation  $PT Eval Low Complexity 1 Low  Written Expression  Dominant Hand Right (Tremors in right)   Patient is s/p above surgery resulting in the deficits listed below (see PT Problem List). Pt requiring min to mod A (+2 for safety) for bed mobility and transfers this session. Pt with increased nausea, so further mobility deferred. Educated about back precautions, walking program, and use of RW at home to increase safety. Patient will benefit from skilled PT to increase their independence and safety with mobility (while adhering to their precautions) to allow discharge to the venue listed below.  Leighton Ruff, PT, DPT  Acute Rehabilitation Services  Pager: [phone] Office: [phone]

## 2018-08-06 NOTE — Transfer of Care (Signed)
Immediate Anesthesia Transfer of Care Note  Patient: [facility] SCHNITK[facility]  Procedure(s) Performed: POSTERIOR LUMBAR INTERBODY FUSION, INTERBODY PROSTHESIS, POSTERIOR INSTRUMENTATION AND FUSION LUMBAR FOUR LUMBAR FIVE (N/A )  Patient Location: PACU  Anesthesia Type:General  Level of Consciousness: drowsy and patient cooperative  Airway & Oxygen Therapy: Patient Spontanous Breathing and Patient connected to face mask oxygen  Post-op Assessment: Report given to RN and Post -op Vital signs reviewed and stable  Post vital signs: Reviewed and stable  Last Vitals:  Vitals Value Taken Time  BP 145/72 08/06/2018 11:13 AM  Temp    Pulse 70 08/06/2018 11:16 AM  Resp 15 08/06/2018 11:16 AM  SpO2 95 % 08/06/2018 11:16 AM  Vitals shown include unvalidated device data.  Last Pain:  Vitals:   08/06/18 0614  TempSrc: Oral  PainSc: 6          Complications: No apparent anesthesia complications

## 2018-08-07 ENCOUNTER — Ambulatory Visit (INDEPENDENT_AMBULATORY_CARE_PROVIDER_SITE_OTHER): Payer: Medicare Other | Admitting: *Deleted

## 2018-08-07 DIAGNOSIS — I442 Atrioventricular block, complete: Secondary | ICD-10-CM | POA: Diagnosis not present

## 2018-08-07 LAB — CBC
HCT: 35.6 % — ABNORMAL LOW (ref 39.0–52.0)
Hemoglobin: 10.9 g/dL — ABNORMAL LOW (ref 13.0–17.0)
MCH: 29.1 pg (ref 26.0–34.0)
MCHC: 30.6 g/dL (ref 30.0–36.0)
MCV: 95.2 fL (ref 80.0–100.0)
NRBC: 0 % (ref 0.0–0.2)
Platelets: 286 10*3/uL (ref 150–400)
RBC: 3.74 MIL/uL — AB (ref 4.22–5.81)
RDW: 15 % (ref 11.5–15.5)
WBC: 17.3 10*3/uL — ABNORMAL HIGH (ref 4.0–10.5)

## 2018-08-07 LAB — BASIC METABOLIC PANEL
ANION GAP: 12 (ref 5–15)
BUN: 40 mg/dL — ABNORMAL HIGH (ref 8–23)
CHLORIDE: 106 mmol/L (ref 98–111)
CO2: 24 mmol/L (ref 22–32)
Calcium: 9.2 mg/dL (ref 8.9–10.3)
Creatinine, Ser: 1.95 mg/dL — ABNORMAL HIGH (ref 0.61–1.24)
GFR calc Af Amer: 38 mL/min — ABNORMAL LOW (ref >60)
GFR calc non Af Amer: 33 mL/min — ABNORMAL LOW (ref >60)
GLUCOSE: 149 mg/dL — AB (ref 70–99)
POTASSIUM: 4.6 mmol/L (ref 3.5–5.1)
Sodium: 142 mmol/L (ref 135–145)

## 2018-08-07 MED ORDER — DOCUSATE SODIUM 100 MG PO CAPS: 100.0000 mg | ORAL_CAPSULE | Freq: Two times a day (BID) | ORAL | 0 refills | Status: DC

## 2018-08-07 MED ORDER — OXYCODONE HCL 5 MG PO TABS: 5.0000 mg | ORAL_TABLET | ORAL | 0 refills | Status: DC | PRN

## 2018-08-07 MED FILL — Sodium Chloride IV Soln 0.9%: INTRAVENOUS | Qty: 1000 | Status: AC

## 2018-08-07 MED FILL — Heparin Sodium (Porcine) Inj 1000 Unit/ML: INTRAMUSCULAR | Qty: 30 | Status: AC

## 2018-08-07 MED FILL — Thrombin (Recombinant) For Soln 20000 Unit: CUTANEOUS | Qty: 1 | Status: AC

## 2018-08-07 NOTE — Discharge Summary (Signed)
Physician Discharge Summary  Patient ID: Matthew Howe MRN: [id number] DOB/AGE: [birth year] 72 y.o.  Admit date: 08/06/2018 Discharge date: 08/07/2018  Admission Diagnoses: Lumbar spinal listhesis, lumbar spinal stenosis, lumbago, lumbar radiculopathy, neurogenic claudication  Discharge Diagnoses: The same Active Problems:   Spondylolisthesis of lumbar region   Discharged Condition: good  Hospital Course: Performed an L4-5 decompression, instrumentation and fusion on patient on 08/06/2018.  The surgery went well.  The patient's postoperative course was unremarkable.  He requested discharge home with postop day #1.  Assuming he does well with PT and OT we will discharge him later today.  I gave him his discharge instructions and answered all his questions.  Consults: Physical therapy, Occupational Therapy Significant Diagnostic Studies: None Treatments: L4-5 decompression, nstrumentation, and fusion. Discharge Exam: Blood pressure 118/76, pulse 67, temperature 97.7 F (36.5 C), temperature source Oral, resp. rate 18, SpO2 95 %. Patient is alert and pleasant.  He looks well.  His strength is normal.  Disposition: Home   Allergies as of 08/07/2018      Reactions   Linzess [linaclotide] Shortness Of Breath   Amitiza [lubiprostone] Other (See Comments)   Abdominal cramps   Codeine Diarrhea, Nausea And Vomiting   Norco [hydrocodone-acetaminophen] Diarrhea, Nausea And Vomiting   Omeprazole Diarrhea, Nausea And Vomiting   Requip  [ropinirole] Nausea Only      Medication List    STOP taking these medications   meloxicam 15 MG tablet Commonly known as:  MOBIC     TAKE these medications   ALLOPURINOL PO Take 400 mg by mouth daily.   carboxymethylcellulose 0.5 % Soln Commonly known as:  REFRESH PLUS Place 1 drop into both eyes daily.   carvedilol 12.5 MG tablet Commonly known as:  COREG TAKE 1 TABLET (12.5 MG TOTAL) BY MOUTH 2 (TWO) TIMES DAILY.   desloratadine 5 MG  tablet Commonly known as:  CLARINEX Take 5 mg by mouth daily as needed (allergies).   dicyclomine 20 MG tablet Commonly known as:  BENTYL Take 20 mg by mouth 3 (three) times daily with meals.   docusate sodium 100 MG capsule Commonly known as:  COLACE Take 1 capsule (100 mg total) by mouth 2 (two) times daily.   ELIQUIS 5 MG Tabs tablet Generic drug:  apixaban TAKE 1 TABLET BY MOUTH TWICE A DAY What changed:  how much to take   fluticasone 50 MCG/ACT nasal spray Commonly known as:  FLONASE Place 2 sprays into the nose daily as needed for allergies.   lansoprazole 15 MG capsule Commonly known as:  PREVACID Take 15 mg by mouth as needed (indigestion).   lisinopril 40 MG tablet Commonly known as:  PRINIVIL,ZESTRIL Take 1 tablet (40 mg total) by mouth daily. What changed:  when to take this   metolazone 2.5 MG tablet Commonly known as:  ZAROXOLYN TAKE 1 TABLET BY MOUTH DAILY AS NEEDED (ONCE DAILY AS NEEDED FOR WEIGHT GAIN OR SWELLING.). What changed:  See the new instructions.   oxyCODONE 5 MG immediate release tablet Commonly known as:  Oxy IR/ROXICODONE Take 1 tablet (5 mg total) by mouth every 4 (four) hours as needed for moderate pain ((score 4 to 6)).   polyethylene glycol packet Commonly known as:  MIRALAX / GLYCOLAX Take 17 g by mouth daily as needed for moderate constipation.   potassium chloride 10 MEQ tablet Commonly known as:  K-DUR TAKE 3 TABLETS BY MOUTH TWICE A DAY   pramipexole 0.5 MG tablet Commonly known as:  MIRAPEX Take  0.5 mg by mouth 3 (three) times daily.   predniSONE 2.5 MG tablet Commonly known as:  DELTASONE Take 2.5 mg by mouth daily with breakfast.   selegiline 5 MG capsule Commonly known as:  ELDEPRYL Take 5 mg by mouth 2 (two) times daily with a meal.   torsemide 20 MG tablet Commonly known as:  DEMADEX TAKE 3 TABLETS (60 MG TOTAL) BY MOUTH 2 (TWO) TIMES DAILY.        Signed: Ophelia Charter 08/07/2018, 7:55 AM

## 2018-08-07 NOTE — Progress Notes (Signed)
Physical Therapy Treatment Patient Details Name: Matthew Howe MRN: [id number] DOB: 1946/02/25 Today's Date: 08/07/2018    History of Present Illness 72 year old white male with Parkinson's disease, atrial fibrillation, anticoagulated, CHF, CAD, GERD, HTN, and pacemaker placement (2016). S/p Bilateral L4-5 laminotomy/foraminotomies/medial facetectomy.     PT Comments    Pt presented HOB elevated, alert and motivated for PT. Pt states decreased overall pain, and a willingness to participate. Pt given education on back precautions and brace application, and verbalized understanding. Pt moved supine to sidelying with min guard, and sidelying to sitting with min A for trunk control. Pt performs sit to stand min guard. Pt demonstrated excessive trunk flexion during standing, with verbal and tactile cueing for improved posture. Pt ambulated with min guard 400 ft, with 2 wheeled walker with continued flexed posture. Pt navigated 1 flight of stairs, using reciprocal movements ascending and sidestepping descending. During ascending, pt moved quickly, going towards his limits of stability. Verbal cueing was given in order to decreased speed and increase safety.  Pt would benefit from continued skilled PT in order to address strength, gait deviations, and functional tasks.         Follow Up Recommendations  Home health PT;Supervision for mobility/OOB     Equipment Recommendations  Rolling walker with 5" wheels    Recommendations for Other Services       Precautions / Restrictions Precautions Precautions: Back Precaution Booklet Issued: Yes (comment) Precaution Comments: Provided education on back precautions. Demonstrated how to apply lumbar corset. Required Braces or Orthoses: Spinal Brace Spinal Brace: Lumbar corset Restrictions Weight Bearing Restrictions: No    Mobility  Bed Mobility Overal bed mobility: Needs Assistance Bed Mobility: Rolling;Sidelying to Sit Rolling: Min  guard Sidelying to sit: Min assist       General bed mobility comments: Able to roll to sidelying min guard for safety. Required min A for UE extremity/trunk. Able to bring LE over the bed independently  Transfers Overall transfer level: Needs assistance Equipment used: Rolling walker (2 wheeled) Transfers: Sit to/from Stand Sit to Stand: +2 safety/equipment;From elevated surface;Min guard         General transfer comment: Min gaurd for safety. Pt stood quickly, using the walker as UE support. Pt cued to use bed to push up to stand  Ambulation/Gait Ambulation/Gait assistance: Min guard Gait Distance (Feet): 400 Feet Assistive device: Rolling walker (2 wheeled) Gait Pattern/deviations: Trunk flexed;Decreased step length - right;Decreased step length - left;Step-through pattern Gait velocity: Decreased Gait velocity interpretation: 1.31 - 2.62 ft/sec, indicative of limited community ambulator General Gait Details: Pt had to be reminded to stand up straight and look ahead. Pt cont to look down and push walker out in front of self.    Stairs Stairs: Yes Stairs assistance: Min guard Stair Management: One rail Right;Alternating pattern;Sideways;Step to pattern;One rail Left Number of Stairs: 10 General stair comments: Pt used R rail during stairs going up. Used alternating pattern. At stair 5 accelerated in speed, flexing trunk forward. Pt was given verbal and tactile cueing to stay within functional limitations and limits of stability. During decent, pt used left hand rail, going sideways. Pt used step to pattern, with decreased speed. Pt given verbal cueing to decrease trunk flexion.    Wheelchair Mobility    Modified Rankin (Stroke Patients Only)       Balance Overall balance assessment: Needs assistance Sitting-balance support: No upper extremity supported;Feet supported Sitting balance-Leahy Scale: Good     Standing balance support: Bilateral upper extremity  supported;During functional activity Standing balance-Leahy Scale: Good Standing balance comment: Held forward flexed posture. Verbal cues given.                            Cognition Arousal/Alertness: Awake/alert Behavior During Therapy: WFL for tasks assessed/performed Overall Cognitive Status: Within Functional Limits for tasks assessed                                 General Comments: decreased saftey awareness. Moves quickly during ambulation/up stairs        Exercises      General Comments        Pertinent Vitals/Pain Pain Assessment: Faces Faces Pain Scale: Hurts a little bit Pain Location: Back Pain Descriptors / Indicators: Aching;Discomfort Pain Intervention(s): Monitored during session    Home Living                      Prior Function            PT Goals (current goals can now be found in the care plan section) Acute Rehab PT Goals PT Goal Formulation: With patient Time For Goal Achievement: 08/20/18 Potential to Achieve Goals: Good Progress towards PT goals: Progressing toward goals    Frequency    Min 5X/week      PT Plan Current plan remains appropriate    Co-evaluation              AM-PAC PT "6 Clicks" Daily Activity  Outcome Measure  Difficulty turning over in bed (including adjusting bedclothes, sheets and blankets)?: A Little Difficulty moving from lying on back to sitting on the side of the bed? : A Little Difficulty sitting down on and standing up from a chair with arms (e.g., wheelchair, bedside commode, etc,.)?: Unable Help needed moving to and from a bed to chair (including a wheelchair)?: None Help needed walking in hospital room?: None Help needed climbing 3-5 steps with a railing? : None 6 Click Score: 19    End of Session Equipment Utilized During Treatment: Gait belt Activity Tolerance: Patient tolerated treatment well;No increased pain Patient left: in chair;with call bell/phone  within reach Nurse Communication: Mobility status PT Visit Diagnosis: Unsteadiness on feet (R26.81);Muscle weakness (generalized) (M62.81)     Time: 1941-7408 PT Time Calculation (min) (ACUTE ONLY): 25 min  Charges:  $Gait Training: 23-37 mins                     Wandra Feinstein, SPT Acute Rehab [phone] (pager) [phone] (office)   Chantry Headen 08/07/2018, 8:42 AM

## 2018-08-07 NOTE — Progress Notes (Signed)
Occupational Therapy Treatment Patient Details Name: Matthew Howe MRN: [id number] DOB: 1946-02-11 Today's Date: 08/07/2018    History of present illness 72 year old white male with Parkinson's disease, atrial fibrillation, anticoagulated, CHF, CAD, GERD, HTN, and pacemaker placement (2016). S/p Bilateral L4-5 laminotomy/foraminotomies/medial facetectomy.    OT comments  Pt progressing towards established OT goals. Continued education on LB ADLs with AE and pt demonstrating understanding as seen by donning pants, underwear, and shoes with Min Guard A for safety in standing. Pt able to verbalize 3/3 back precautions. Pt performing simulated tub transfer and requiring Min A for safety. Continue to recommend dc to home with HHOT and all acute OT needs met. Will sign off. Thank you.   Follow Up Recommendations  Home health OT;Supervision/Assistance - 24 hour    Equipment Recommendations  3 in 1 bedside commode    Recommendations for Other Services PT consult    Precautions / Restrictions Precautions Precautions: Back Precaution Booklet Issued: Yes (comment) Precaution Comments: Provided education on back precautions. Demonstrated how to apply lumbar corset. Required Braces or Orthoses: Spinal Brace Spinal Brace: Lumbar corset Restrictions Weight Bearing Restrictions: No       Mobility Bed Mobility Overal bed mobility: Needs Assistance Bed Mobility: Rolling;Sidelying to Sit Rolling: Min guard Sidelying to sit: Min assist       General bed mobility comments: In recliner upon arrival  Transfers Overall transfer level: Needs assistance Equipment used: Rolling walker (2 wheeled) Transfers: Sit to/from Stand Sit to Stand: Min guard         General transfer comment: Min Guard A for safety    Balance Overall balance assessment: Needs assistance Sitting-balance support: No upper extremity supported;Feet supported Sitting balance-Leahy Scale: Good     Standing balance  support: Bilateral upper extremity supported;During functional activity Standing balance-Leahy Scale: Good Standing balance comment: Held forward flexed posture. Verbal cues given.                           ADL either performed or assessed with clinical judgement   ADL Overall ADL's : Needs assistance/impaired                 Upper Body Dressing : Set up;Supervision/safety;Sitting Upper Body Dressing Details (indicate cue type and reason): Pt able to don shirt and brace with supervision Lower Body Dressing: Min guard;Sit to/from stand;With adaptive equipment Lower Body Dressing Details (indicate cue type and reason): Continues education on AE for LB ADLs. Pt demonstrating increased independence with use of AE. Pt donning pants, socks, and shoes with AE.          Tub/ Shower Transfer: Tub transfer;Minimal assistance;Ambulation;Rolling walker;Shower Scientist, research (medical) Details (indicate cue type and reason): Discussed tub transfer and problem solving home set up. Pt requiring Min A for safety.  Functional mobility during ADLs: Min guard;Rolling walker General ADL Comments: Providing education on LB ADLs with AE and tub transfer. Pt demonstrating understanding.     Vision       Perception     Praxis      Cognition Arousal/Alertness: Awake/alert Behavior During Therapy: WFL for tasks assessed/performed Overall Cognitive Status: Within Functional Limits for tasks assessed                                 General Comments: decreased saftey awareness. Moves quickly during ambulation/up stairs  Exercises     Shoulder Instructions       General Comments      Pertinent Vitals/ Pain       Pain Assessment: Faces Faces Pain Scale: Hurts a little bit Pain Location: Back Pain Descriptors / Indicators: Aching;Discomfort Pain Intervention(s): Monitored during session;Repositioned;Limited activity within patient's tolerance  Home  Living                                          Prior Functioning/Environment              Frequency  Min 2X/week        Progress Toward Goals  OT Goals(current goals can now be found in the care plan section)  Progress towards OT goals: Progressing toward goals  Acute Rehab OT Goals Patient Stated Goal: "Feel better" OT Goal Formulation: With patient/family Time For Goal Achievement: 08/20/18 Potential to Achieve Goals: Good ADL Goals Pt Will Perform Upper Body Dressing: with set-up;with supervision;sitting;with caregiver independent in assisting Pt Will Perform Lower Body Dressing: with set-up;with supervision;sit to/from stand;with adaptive equipment Pt Will Transfer to Toilet: with set-up;with supervision;ambulating;bedside commode Pt Will Perform Tub/Shower Transfer: Tub transfer;3 in 1;rolling walker;ambulating;with min guard assist  Plan Discharge plan remains appropriate;All goals met and education completed, patient discharged from OT services    Co-evaluation                 AM-PAC PT "6 Clicks" Daily Activity     Outcome Measure   Help from another person eating meals?: None Help from another person taking care of personal grooming?: A Little Help from another person toileting, which includes using toliet, bedpan, or urinal?: A Little Help from another person bathing (including washing, rinsing, drying)?: A Little Help from another person to put on and taking off regular upper body clothing?: A Little Help from another person to put on and taking off regular lower body clothing?: A Little 6 Click Score: 19    End of Session Equipment Utilized During Treatment: Rolling walker  OT Visit Diagnosis: Unsteadiness on feet (R26.81);Other abnormalities of gait and mobility (R26.89);Muscle weakness (generalized) (M62.81);Pain Pain - part of body: (Back)   Activity Tolerance Patient tolerated treatment well   Patient Left in chair;with  call bell/phone within reach   Nurse Communication Mobility status;Precautions        Time: 9826-4158 OT Time Calculation (min): 26 min  Charges: OT General Charges $OT Visit: 1 Visit OT Treatments $Self Care/Home Management : 23-37 mins  [place], OTR/L Acute Rehab Pager: [phone] Office: [place] 08/07/2018, 9:25 AM

## 2018-08-07 NOTE — Progress Notes (Signed)
Pt and wife refused Home Health services, CM notified. Holli Humbles, RN

## 2018-08-07 NOTE — Progress Notes (Signed)
Pt and wife given D/C instructions with Rx's, verbal understanding was provided. Pt's incision is clean and dry with no sign of infection. Pt's IV was removed per MD order. Pt received RW and 3-n-1 from [place] per MD order. Pt is stable @ D/C and has no other needs at this time. Pt D/C'd home via wheelchair @ 1130. Holli Humbles, RN

## 2018-08-08 NOTE — Progress Notes (Signed)
Remote pacemaker transmission.   

## 2018-08-11 ENCOUNTER — Other Ambulatory Visit: Payer: Self-pay | Admitting: Cardiovascular Disease

## 2018-08-28 DIAGNOSIS — I10 Essential (primary) hypertension: Secondary | ICD-10-CM | POA: Diagnosis not present

## 2018-08-28 DIAGNOSIS — M4316 Spondylolisthesis, lumbar region: Secondary | ICD-10-CM | POA: Diagnosis not present

## 2018-08-28 DIAGNOSIS — Z6836 Body mass index (BMI) 36.0-36.9, adult: Secondary | ICD-10-CM | POA: Diagnosis not present

## 2018-08-29 ENCOUNTER — Telehealth: Payer: Self-pay | Admitting: Cardiovascular Disease

## 2018-08-29 NOTE — Telephone Encounter (Signed)
° °  [facility] Pre-operative Risk Assessment    Request for surgical clearance:  1. What type of surgery is being performed? R Carpel Tunnel Release    2. When is this surgery scheduled? 10-22-2018    3. What type of clearance is required (medical clearance vs. Pharmacy clearance to hold med vs. Both)? both  4. Are there any medications that need to be held prior to surgery and how long? Please advise    5. Practice name and name of physician performing surgery? [facility] Dr Marry Guan   6. What is your office phone number [phone]     7.   What is your office fax number [phone]  8.   Anesthesia type (None, local, MAC, general) ? Not noted    Clarisse Gouge 08/29/2018, 10:00 AM  _________________________________________________________________   (provider comments below)

## 2018-08-29 NOTE — Telephone Encounter (Signed)
Also received device clearance from [facility]   Both forms placed in nurse box

## 2018-08-29 NOTE — Telephone Encounter (Signed)
Patient on Eliquis. Routing to Dr Rockey Situ for clearance.   Device clearance form faxed to [facility] in [place] for completion.

## 2018-08-29 NOTE — Telephone Encounter (Signed)
Device form faxed back with confirmation.

## 2018-08-31 NOTE — Telephone Encounter (Signed)
I left a message for Tiffany at [facility] that the patient's cardiac clearance is pending review by Dr. Rockey Situ, but if she has any other questions she can call us back if needed.

## 2018-08-31 NOTE — Telephone Encounter (Signed)
Please call regarding medical clearacne form for [facility]

## 2018-09-01 NOTE — Telephone Encounter (Signed)
Ok to stop eliquis for 2 days prior to each procedure, Only restart eliquis when approved by surgeons

## 2018-09-03 NOTE — Telephone Encounter (Signed)
Routed to number provided via EPIC fax.  

## 2018-09-18 DIAGNOSIS — H2513 Age-related nuclear cataract, bilateral: Secondary | ICD-10-CM | POA: Diagnosis not present

## 2018-09-18 DIAGNOSIS — H4323 Crystalline deposits in vitreous body, bilateral: Secondary | ICD-10-CM | POA: Diagnosis not present

## 2018-09-19 NOTE — Telephone Encounter (Signed)
Copy of phone encounter faxed to [place] with clearance recommendations.- [phone]. Confirmation received.   Per Hulan Amato.- Call received back from [facility] that the original form has to be filled out- they will not take any other form of clearance. I advised scheduling to notify [facility] that I am going to give this form to Dr. Donivan Scull nurse to have him complete.

## 2018-09-19 NOTE — Telephone Encounter (Signed)
Received duplicate of medical release form from [place] requesting to be filled out and faxed back Placed in nurse box

## 2018-09-20 NOTE — Telephone Encounter (Signed)
Spoke with Tiffaney at [facility] orth[facility] regarding clearance for this patient. They are requesting that form is completed and signed. She states that they received the Eliquis instructions but also need provider to address the following questions:  Is this patient optimized to have surgery? Yes or No Risk: Low or Moderate or High  Advised that I would route to provider for review. Reviewed that when clearance forms arrive to our office a clearance request is entered into Epic and then routed to nurse for review prior to provider. Once nurse reviews chart then it is routed to provider for clearance. Provider will then provide clearance through Epic notes and we route via Epic fax to office requesting office.   Let her know that I would send this to provider for further clarification of clearance and would send to their office once completed.

## 2018-09-20 NOTE — Telephone Encounter (Signed)
Spoke with patient and his wife and they both report no problems at this time. She states that he did have a small amount of weight gain due to back surgery and being inactive but that it has gone back down since he has been moving around. Instructed them to please give [place] a call if he should have any problems before his upcoming procedure. She verbalized understanding with no further questions at this time.

## 2018-09-20 NOTE — Telephone Encounter (Signed)
Reviewed clearance request from Dr. Maree Krabbe office and Dr. Rockey Situ listed him as being optimized for surgery and that he is at moderate risk. He did sign form that I was given so I will fax that over to them as well.

## 2018-10-08 ENCOUNTER — Other Ambulatory Visit: Payer: Self-pay

## 2018-10-08 ENCOUNTER — Encounter
Admission: RE | Admit: 2018-10-08 | Discharge: 2018-10-08 | Disposition: A | Discharge status: Home / Self Care | Payer: Medicare Other | Source: Ambulatory Visit | Attending: Orthopedic Surgery | Admitting: Orthopedic Surgery

## 2018-10-08 DIAGNOSIS — Z01818 Encounter for other preprocedural examination: Secondary | ICD-10-CM | POA: Diagnosis not present

## 2018-10-08 LAB — CBC
HCT: 35.3 % — ABNORMAL LOW (ref 39.0–52.0)
Hemoglobin: 10.7 g/dL — ABNORMAL LOW (ref 13.0–17.0)
MCH: 28.1 pg (ref 26.0–34.0)
MCHC: 30.3 g/dL (ref 30.0–36.0)
MCV: 92.7 fL (ref 80.0–100.0)
Platelets: 284 10*3/uL (ref 150–400)
RBC: 3.81 MIL/uL — ABNORMAL LOW (ref 4.22–5.81)
RDW: 15.7 % — ABNORMAL HIGH (ref 11.5–15.5)
WBC: 7.7 10*3/uL (ref 4.0–10.5)
nRBC: 0 % (ref 0.0–0.2)

## 2018-10-08 LAB — BASIC METABOLIC PANEL
Anion gap: 10 (ref 5–15)
BUN: 69 mg/dL — ABNORMAL HIGH (ref 8–23)
CO2: 27 mmol/L (ref 22–32)
Calcium: 8.9 mg/dL (ref 8.9–10.3)
Chloride: 104 mmol/L (ref 98–111)
Creatinine, Ser: 2.29 mg/dL — ABNORMAL HIGH (ref 0.61–1.24)
GFR calc Af Amer: 32 mL/min — ABNORMAL LOW (ref >60)
GFR, EST NON AFRICAN AMERICAN: 28 mL/min — AB (ref >60)
Glucose, Bld: 126 mg/dL — ABNORMAL HIGH (ref 70–99)
Potassium: 4.4 mmol/L (ref 3.5–5.1)
Sodium: 141 mmol/L (ref 135–145)

## 2018-10-08 NOTE — Pre-Procedure Instructions (Signed)
DEVICE PROGRAMMING FORM FAXED TO [facility] [phone]

## 2018-10-08 NOTE — Telephone Encounter (Signed)
Ok to stop eliquis for 2 days prior to each procedure, Only restart eliquis when approved by surgeons  I spoke with pt's wife and gave her above information from Dr. Rockey Situ

## 2018-10-08 NOTE — Telephone Encounter (Signed)
Patient wife calling States they will need to know when to stop Eliquis for upcoming surgery Please call to discuss

## 2018-10-08 NOTE — Patient Instructions (Signed)
Your procedure is scheduled on: Monday 10/22/18 Report to [facility]. To find out your arrival time please call [phone] between 1PM - 3PM on Friday 10/19/18.  Remember: Instructions that are not followed completely may result in serious medical risk, up to and including death, or upon the discretion of your surgeon and anesthesiologist your surgery may need to be rescheduled.     _X__ 1. Do not eat food after midnight the night before your procedure.                 No gum chewing or hard candies. You may drink clear liquids up to 2 hours                 before you are scheduled to arrive for your surgery- DO not drink clear                 liquids within 2 hours of the start of your surgery.                 Clear Liquids include:  water, apple juice without pulp, clear carbohydrate                 drink such as Clearfast or Gatorade, Black Coffee or Tea (Do not add                 anything to coffee or tea).  __X__2.  On the morning of surgery brush your teeth with toothpaste and water, you                 may rinse your mouth with mouthwash if you wish.  Do not swallow any              toothpaste of mouthwash.     _X__ 3.  No Alcohol for 24 hours before or after surgery.   _X__ 4.  Do Not Smoke or use e-cigarettes For 24 Hours Prior to Your Surgery.                 Do not use any chewable tobacco products for at least 6 hours prior to                 surgery.  ____  5.  Bring all medications with you on the day of surgery if instructed.   __X__  6.  Notify your doctor if there is any change in your medical condition      (cold, fever, infections).     Do not wear jewelry, make-up, hairpins, clips or nail polish. Do not wear lotions, powders, or perfumes.  Do not shave 48 hours prior to surgery. Men may shave face and neck. Do not bring valuables to the hospital.    [facility] is not responsible for any belongings or  valuables.  Contacts, dentures/partials or body piercings may not be worn into surgery. Bring a case for your contacts, glasses or hearing aids, a denture cup will be supplied. Leave your suitcase in the car. After surgery it may be brought to your room. For patients admitted to the hospital, discharge time is determined by your treatment team.   Patients discharged the day of surgery will not be allowed to drive home.   Please read over the following fact sheets that you were given:   MRSA Information  __X__ Take these medicines the morning of surgery with A SIP OF WATER:  1. allopurinol (ZYLOPRIM)  2. carvedilol (COREG)  3. lansoprazole (PREVACID)   4. pramipexole (MIRAPEX)   5. selegiline (ELDEPRYL)  6.  ____ Fleet Enema (as directed)   __X__ Use CHG Soap/SAGE wipes as directed  ____ Use inhalers on the day of surgery  ____ Stop metformin/Janumet/Farxiga 2 days prior to surgery    ____ Take 1/2 of usual insulin dose the night before surgery. No insulin the morning          of surgery.   __X__ Stop Blood Thinners Coumadin/Plavix/Xarelto/Pleta/Pradaxa/Eliquis/Effient/Aspirin   Or contact your Surgeon, Cardiologist or Medical Doctor regarding  ability to stop your blood thinners  __X__ Stop Anti-inflammatories 7 days before surgery such as Advil, Ibuprofen, Motrin,  BC or Goodies Powder, Naprosyn, Naproxen, Aleve, Aspirin, MELOXICAM   __X__ Stop all herbal supplements, fish oil or vitamin E until after surgery.    ____ Bring C-Pap to the hospital.

## 2018-10-09 LAB — CUP PACEART REMOTE DEVICE CHECK
Battery Remaining Percentage: 95.5 %
Battery Voltage: 2.98 V
Brady Statistic AP VP Percent: 25 %
Brady Statistic AP VS Percent: 1 %
Brady Statistic AS VP Percent: 75 %
Brady Statistic RA Percent Paced: 24 %
Brady Statistic RV Percent Paced: 99 %
Date Time Interrogation Session: 20191030024325
Implantable Lead Implant Date: 20160212
Implantable Lead Location: 753860
Implantable Lead Model: 1948
Implantable Pulse Generator Implant Date: 20160212
Lead Channel Impedance Value: 450 Ohm
Lead Channel Impedance Value: 540 Ohm
Lead Channel Pacing Threshold Amplitude: 0.625 V
Lead Channel Pacing Threshold Amplitude: 1.125 V
Lead Channel Pacing Threshold Pulse Width: 0.4 ms
Lead Channel Pacing Threshold Pulse Width: 0.4 ms
Lead Channel Sensing Intrinsic Amplitude: 10.1 mV
Lead Channel Sensing Intrinsic Amplitude: 2.6 mV
Lead Channel Setting Pacing Amplitude: 1.375
Lead Channel Setting Pacing Amplitude: 2 V
Lead Channel Setting Pacing Pulse Width: 0.4 ms
Lead Channel Setting Sensing Sensitivity: 4 mV
MDC IDC LEAD IMPLANT DT: 20160212
MDC IDC LEAD LOCATION: 753859
MDC IDC MSMT BATTERY REMAINING LONGEVITY: 122 mo
MDC IDC STAT BRADY AS VS PERCENT: 1 %
Pulse Gen Model: 2240
Pulse Gen Serial Number: 3049931

## 2018-10-09 NOTE — Pre-Procedure Instructions (Signed)
Met B and CBC results sent to Dr. Marry Guan and Anesthesia for review.

## 2018-10-10 DIAGNOSIS — N189 Chronic kidney disease, unspecified: Secondary | ICD-10-CM

## 2018-10-10 HISTORY — DX: Chronic kidney disease, unspecified: N18.9

## 2018-10-22 ENCOUNTER — Ambulatory Visit: Payer: Medicare Other | Admitting: Anesthesiology

## 2018-10-22 ENCOUNTER — Encounter: Payer: Self-pay | Admitting: Orthopedic Surgery

## 2018-10-22 ENCOUNTER — Encounter
Admission: RE | Disposition: A | Discharge status: Home / Self Care | Payer: Self-pay | Source: Home / Self Care | Attending: Orthopedic Surgery

## 2018-10-22 ENCOUNTER — Ambulatory Visit
Admission: RE | Admit: 2018-10-22 | Discharge: 2018-10-22 | Disposition: A | Discharge status: Home / Self Care | Payer: Medicare Other | Attending: Orthopedic Surgery | Admitting: Orthopedic Surgery

## 2018-10-22 DIAGNOSIS — G20 Parkinson's disease: Secondary | ICD-10-CM | POA: Insufficient documentation

## 2018-10-22 DIAGNOSIS — Z7901 Long term (current) use of anticoagulants: Secondary | ICD-10-CM | POA: Diagnosis not present

## 2018-10-22 DIAGNOSIS — I2510 Atherosclerotic heart disease of native coronary artery without angina pectoris: Secondary | ICD-10-CM | POA: Diagnosis not present

## 2018-10-22 DIAGNOSIS — Z885 Allergy status to narcotic agent status: Secondary | ICD-10-CM | POA: Insufficient documentation

## 2018-10-22 DIAGNOSIS — I4891 Unspecified atrial fibrillation: Secondary | ICD-10-CM | POA: Diagnosis not present

## 2018-10-22 DIAGNOSIS — I442 Atrioventricular block, complete: Secondary | ICD-10-CM | POA: Diagnosis not present

## 2018-10-22 DIAGNOSIS — N183 Chronic kidney disease, stage 3 (moderate): Secondary | ICD-10-CM | POA: Diagnosis not present

## 2018-10-22 DIAGNOSIS — K219 Gastro-esophageal reflux disease without esophagitis: Secondary | ICD-10-CM | POA: Diagnosis not present

## 2018-10-22 DIAGNOSIS — Z950 Presence of cardiac pacemaker: Secondary | ICD-10-CM | POA: Diagnosis not present

## 2018-10-22 DIAGNOSIS — G5603 Carpal tunnel syndrome, bilateral upper limbs: Secondary | ICD-10-CM | POA: Insufficient documentation

## 2018-10-22 DIAGNOSIS — Z888 Allergy status to other drugs, medicaments and biological substances status: Secondary | ICD-10-CM | POA: Insufficient documentation

## 2018-10-22 DIAGNOSIS — I130 Hypertensive heart and chronic kidney disease with heart failure and stage 1 through stage 4 chronic kidney disease, or unspecified chronic kidney disease: Secondary | ICD-10-CM | POA: Diagnosis not present

## 2018-10-22 DIAGNOSIS — Z79899 Other long term (current) drug therapy: Secondary | ICD-10-CM | POA: Diagnosis not present

## 2018-10-22 DIAGNOSIS — I509 Heart failure, unspecified: Secondary | ICD-10-CM | POA: Diagnosis not present

## 2018-10-22 HISTORY — DX: Carpal tunnel syndrome, bilateral upper limbs: G56.03

## 2018-10-22 HISTORY — PX: CARPAL TUNNEL RELEASE: SHX101

## 2018-10-22 HISTORY — DX: Personal history of other medical treatment: Z92.89

## 2018-10-22 SURGERY — CARPAL TUNNEL RELEASE
Anesthesia: General | Laterality: Right

## 2018-10-22 MED ORDER — LACTATED RINGERS IV SOLN
INTRAVENOUS | Status: DC
  Administered 2018-10-22: 16:00:00 via INTRAVENOUS

## 2018-10-22 MED ORDER — MIDAZOLAM HCL 2 MG/2ML IJ SOLN
INTRAMUSCULAR | Status: AC
  Filled 2018-10-22: qty 2

## 2018-10-22 MED ORDER — PROPOFOL 10 MG/ML IV BOLUS
INTRAVENOUS | Status: AC
  Filled 2018-10-22: qty 20

## 2018-10-22 MED ORDER — SODIUM CHLORIDE 0.9 % IV SOLN
INTRAVENOUS | Status: DC | PRN
  Administered 2018-10-22 (×2): 50 ug via INTRAVENOUS
  Administered 2018-10-22: 100 ug via INTRAVENOUS

## 2018-10-22 MED ORDER — METOCLOPRAMIDE HCL 10 MG PO TABS: 5.0000 mg | ORAL_TABLET | Freq: Three times a day (TID) | ORAL | Status: DC | PRN

## 2018-10-22 MED ORDER — LIDOCAINE HCL (PF) 0.5 % IJ SOLN
INTRAMUSCULAR | Status: AC
  Filled 2018-10-22: qty 50

## 2018-10-22 MED ORDER — DEXMEDETOMIDINE HCL IN NACL 80 MCG/20ML IV SOLN
INTRAVENOUS | Status: AC
  Filled 2018-10-22: qty 20

## 2018-10-22 MED ORDER — DEXMEDETOMIDINE HCL IN NACL 200 MCG/50ML IV SOLN
INTRAVENOUS | Status: DC | PRN
  Administered 2018-10-22 (×5): 8 ug via INTRAVENOUS
  Administered 2018-10-22: 16 ug via INTRAVENOUS
  Administered 2018-10-22 (×2): 8 ug via INTRAVENOUS

## 2018-10-22 MED ORDER — TRAMADOL HCL 50 MG PO TABS: 50.0000 mg | ORAL_TABLET | Freq: Four times a day (QID) | ORAL | 0 refills | Status: DC | PRN

## 2018-10-22 MED ORDER — KETOROLAC TROMETHAMINE 15 MG/ML IJ SOLN
INTRAMUSCULAR | Status: DC | PRN
  Administered 2018-10-22: 15 mg via INTRAVENOUS

## 2018-10-22 MED ORDER — ONDANSETRON HCL 4 MG PO TABS: 4.0000 mg | ORAL_TABLET | Freq: Four times a day (QID) | ORAL | Status: DC | PRN

## 2018-10-22 MED ORDER — OXYCODONE HCL 5 MG PO TABS: 5.0000 mg | ORAL_TABLET | ORAL | Status: DC | PRN

## 2018-10-22 MED ORDER — PROPOFOL 500 MG/50ML IV EMUL
INTRAVENOUS | Status: DC | PRN
  Administered 2018-10-22: 40 ug/kg/min via INTRAVENOUS

## 2018-10-22 MED ORDER — FLUMAZENIL 0.5 MG/5ML IV SOLN
INTRAVENOUS | Status: AC
  Administered 2018-10-22: 0.2 mg via INTRAVENOUS
  Filled 2018-10-22: qty 5

## 2018-10-22 MED ORDER — FLUMAZENIL 0.5 MG/5ML IV SOLN
0.2000 mg | Freq: Once | INTRAVENOUS | Status: AC
  Administered 2018-10-22: 0.2 mg via INTRAVENOUS

## 2018-10-22 MED ORDER — FENTANYL CITRATE (PF) 100 MCG/2ML IJ SOLN
INTRAMUSCULAR | Status: AC
  Filled 2018-10-22: qty 2

## 2018-10-22 MED ORDER — CELECOXIB 200 MG PO CAPS
400.0000 mg | ORAL_CAPSULE | Freq: Once | ORAL | Status: AC
  Administered 2018-10-22: 400 mg via ORAL

## 2018-10-22 MED ORDER — SODIUM CHLORIDE 0.9 % IV SOLN
INTRAVENOUS | Status: DC | PRN
  Administered 2018-10-22: 2 mL

## 2018-10-22 MED ORDER — LIDOCAINE HCL (PF) 0.5 % IJ SOLN
INTRAMUSCULAR | Status: DC | PRN
  Administered 2018-10-22: 50 mL via INTRAVENOUS
  Administered 2018-10-22: 20 mL via INTRAVENOUS

## 2018-10-22 MED ORDER — ONDANSETRON HCL 4 MG/2ML IJ SOLN: 4.0000 mg | Freq: Once | INTRAMUSCULAR | Status: DC | PRN

## 2018-10-22 MED ORDER — FENTANYL CITRATE (PF) 100 MCG/2ML IJ SOLN: 25.0000 ug | INTRAMUSCULAR | Status: DC | PRN

## 2018-10-22 MED ORDER — METOCLOPRAMIDE HCL 5 MG/ML IJ SOLN: 5.0000 mg | Freq: Three times a day (TID) | INTRAMUSCULAR | Status: DC | PRN

## 2018-10-22 MED ORDER — BUPIVACAINE HCL (PF) 0.25 % IJ SOLN
INTRAMUSCULAR | Status: DC | PRN
  Administered 2018-10-22: 10 mL

## 2018-10-22 MED ORDER — CHLORHEXIDINE GLUCONATE 4 % EX LIQD: 60.0000 mL | Freq: Once | CUTANEOUS | Status: DC

## 2018-10-22 MED ORDER — CELECOXIB 200 MG PO CAPS
ORAL_CAPSULE | ORAL | Status: AC
  Administered 2018-10-22: 400 mg via ORAL
  Filled 2018-10-22: qty 2

## 2018-10-22 MED ORDER — ONDANSETRON HCL 4 MG/2ML IJ SOLN: 4.0000 mg | Freq: Four times a day (QID) | INTRAMUSCULAR | Status: DC | PRN

## 2018-10-22 MED ORDER — FENTANYL CITRATE (PF) 100 MCG/2ML IJ SOLN
INTRAMUSCULAR | Status: DC | PRN
  Administered 2018-10-22 (×2): 50 ug via INTRAVENOUS

## 2018-10-22 MED ORDER — MIDAZOLAM HCL 2 MG/2ML IJ SOLN
INTRAMUSCULAR | Status: DC | PRN
  Administered 2018-10-22: 2 mg via INTRAVENOUS

## 2018-10-22 SURGICAL SUPPLY — 26 items
BANDAGE ELASTIC 3 LF NS (GAUZE/BANDAGES/DRESSINGS) ×3 IMPLANT
BNDG ESMARK 4X12 TAN STRL LF (GAUZE/BANDAGES/DRESSINGS) ×3 IMPLANT
CANISTER SUCT 1200ML W/VALVE (MISCELLANEOUS) ×3 IMPLANT
CAST PADDING 3X4FT ST 30246 (SOFTGOODS) ×2
COVER WAND RF STERILE (DRAPES) ×3 IMPLANT
CUFF DUAL TOURNIQUET 24IN DISP (TOURNIQUET CUFF) ×3 IMPLANT
CUFF TOURN 18 STER (MISCELLANEOUS) ×3 IMPLANT
DRSG DERMACEA 8X12 NADH (GAUZE/BANDAGES/DRESSINGS) ×3 IMPLANT
DURAPREP 26ML APPLICATOR (WOUND CARE) ×3 IMPLANT
ELECT CAUTERY BLADE 6.4 (BLADE) ×3 IMPLANT
ELECT REM PT RETURN 9FT ADLT (ELECTROSURGICAL) ×3
ELECTRODE REM PT RTRN 9FT ADLT (ELECTROSURGICAL) ×1 IMPLANT
GAUZE SPONGE 4X4 12PLY STRL (GAUZE/BANDAGES/DRESSINGS) ×3 IMPLANT
GLOVE BIOGEL M STRL SZ7.5 (GLOVE) ×3 IMPLANT
GLOVE INDICATOR 8.0 STRL GRN (GLOVE) ×3 IMPLANT
GOWN STRL REUS W/ TWL LRG LVL3 (GOWN DISPOSABLE) ×2 IMPLANT
GOWN STRL REUS W/TWL LRG LVL3 (GOWN DISPOSABLE) ×4
KIT TURNOVER KIT A (KITS) ×3 IMPLANT
NS IRRIG 500ML POUR BTL (IV SOLUTION) ×3 IMPLANT
PACK EXTREMITY ARMC (MISCELLANEOUS) ×3 IMPLANT
PAD CAST CTTN 3X4 STRL (SOFTGOODS) ×1 IMPLANT
SOL PREP PVP 2OZ (MISCELLANEOUS) ×3
SOLUTION PREP PVP 2OZ (MISCELLANEOUS) ×1 IMPLANT
SPLINT CAST 1 STEP 3X12 (MISCELLANEOUS) ×3 IMPLANT
STOCKINETTE STRL 4IN 9604848 (GAUZE/BANDAGES/DRESSINGS) ×3 IMPLANT
SUT ETHILON 5-0 FS-2 18 BLK (SUTURE) ×3 IMPLANT

## 2018-10-22 NOTE — Anesthesia Post-op Follow-up Note (Signed)
Anesthesia QCDR form completed.        

## 2018-10-22 NOTE — H&P (Signed)
The patient has been re-examined, and the chart reviewed, and there have been no interval changes to the documented history and physical.    The risks, benefits, and alternatives have been discussed at length. The patient expressed understanding of the risks benefits and agreed with plans for surgical intervention.  James P. Hooten, Jr. M.D.    

## 2018-10-22 NOTE — Transfer of Care (Signed)
Immediate Anesthesia Transfer of Care Note  Patient: Matthew Howe  Procedure(s) Performed: Procedure(s): CARPAL TUNNEL RELEASE (Right)  Patient Location: PACU  Anesthesia Type:General  Level of Consciousness: sedated  Airway & Oxygen Therapy: Patient Spontanous Breathing and Patient connected to face mask oxygen  Post-op Assessment: Report given to RN and Post -op Vital signs reviewed and stable  Post vital signs: Reviewed and stable  Last Vitals:  Vitals:   10/22/18 1412 10/22/18 1650  BP: 133/74 (!) 94/55  Pulse: 70 70  Resp: 15 12  Temp: 36.6 C (!) 36.4 C  SpO2: 94% 446%    Complications: No apparent anesthesia complications

## 2018-10-22 NOTE — Anesthesia Preprocedure Evaluation (Addendum)
Anesthesia Evaluation  Patient identified by MRN, date of birth, ID band Patient awake    Reviewed: Allergy & Precautions, NPO status , Patient's Chart, lab work & pertinent test results, reviewed documented beta blocker date and time   History of Anesthesia Complications (+) PROLONGED EMERGENCE and history of anesthetic complications  Airway Mallampati: II  TM Distance: >3 FB Neck ROM: Full    Dental  (+) Dental Advisory Given   Pulmonary shortness of breath and with exertion,    breath sounds clear to auscultation       Cardiovascular hypertension, Pt. on medications and Pt. on home beta blockers + CAD and +CHF  + dysrhythmias Atrial Fibrillation + pacemaker + Cardiac Defibrillator + Valvular Problems/Murmurs  Rhythm:Regular Rate:Normal  EKG: 05/29/2018: AV Dual paced rhythm  CV: TTE 12/08/2016: Study Conclusions - Left ventricle: The cavity size was normal. Wall thickness wasnormal. Systolic function was normal. The estimated ejectionfraction was in the range of 55% to 60%. The study is nottechnically sufficient to allow evaluation of LV diastolicfunction. - Aortic valve: There was mild regurgitation per color doppler.Estimated PHT 533ms. - Aortic root: The aortic root was mildly dilated, 3.8cm. - Ascending aorta: The ascending aorta was mildly dilated, 3.9cm. - Mitral valve: There was mild regurgitation. - Left atrium: The atrium was mildly to moderately dilated.   Neuro/Psych Parkinsons  Neuromuscular disease negative psych ROS   GI/Hepatic Neg liver ROS, GERD  ,  Endo/Other  negative endocrine ROS  Renal/GU CRFRenal disease  negative genitourinary   Musculoskeletal  (+) Arthritis ,   Abdominal   Peds negative pediatric ROS (+)  Hematology negative hematology ROS (+)   Anesthesia Other Findings Past Medical History: No date: Atrial fibrillation (HCC) No date: CHF (congestive heart failure)  ([place]) 10/2018: CKD (chronic kidney disease)     Comment:  recent diagnosis No date: Complete heart block ([place])     Comment:  a. s/p St. Jude PPM 11/2014. No date: Complication of anesthesia     Comment:  stayed overnight dt under too long d/t taking               Parkinson's medication (2013?) No date: Coronary artery disease No date: GERD (gastroesophageal reflux disease)     Comment:  "not as bad as it used to be" No date: Heart murmur No date: Hyperlipidemia No date: Hypertension No date: Parkinson's disease ([place]) No date: Presence of permanent cardiac pacemaker     Comment:  St Jude No date: Shortness of breath dyspnea  Reproductive/Obstetrics                             Lab Results  Component Value Date   WBC 7.7 10/08/2018   HGB 10.7 (L) 10/08/2018   HCT 35.3 (L) 10/08/2018   MCV 92.7 10/08/2018   PLT 284 10/08/2018   Lab Results  Component Value Date   CREATININE 2.29 (H) 10/08/2018   BUN 69 (H) 10/08/2018   NA 141 10/08/2018   K 4.4 10/08/2018   CL 104 10/08/2018   CO2 27 10/08/2018    Anesthesia Physical  Anesthesia Plan  ASA: III  Anesthesia Plan: Bier Block and Bier Block-LIDOCAINE ONLY   Post-op Pain Management:    Induction: Intravenous  PONV Risk Score and Plan: TIVA  Airway Management Planned: Nasal Cannula  Additional Equipment:   Intra-op Plan:   Post-operative Plan:   Informed Consent: I have reviewed the patients History and Physical,  chart, labs and discussed the procedure including the risks, benefits and alternatives for the proposed anesthesia with the patient or authorized representative who has indicated his/her understanding and acceptance.   Dental advisory given  Plan Discussed with: CRNA and Surgeon  Anesthesia Plan Comments:        Anesthesia Quick Evaluation

## 2018-10-22 NOTE — Op Note (Signed)
OPERATIVE NOTE  DATE OF SURGERY:  10/22/2018  PATIENT NAME:  Matthew Howe   DOB: 1945-10-17  MRN: [id number]  PRE-OPERATIVE DIAGNOSIS: Right carpal tunnel syndrome  POST-OPERATIVE DIAGNOSIS:  Same  PROCEDURE:  Right carpal tunnel release  SURGEON:  Marciano Sequin. M.D.  ANESTHESIA: Bier block  ESTIMATED BLOOD LOSS: Minimal  FLUIDS REPLACED: 700 mL of crystalloid  TOURNIQUET TIME: 49 minutes  DRAINS: None  INDICATIONS FOR SURGERY: [name] is a 73 y.o. year old male with a long history of numbness and paresthesias to the right hand. EMG/nerve conduction studies demonstrated findings consistent with carpal tunnel syndrome.The patient had not seen any significant improvement despite conservative nonsurgical intervention. After discussion of the risks and benefits of surgical intervention, the patient expressed understanding of the risks benefits and agree with plans for carpal tunnel release.   PROCEDURE IN DETAIL: The patient was brought into the operating room and after adequate Bier block anesthesia, the right hand and arm were prepped with alcohol and Duraprep and draped in the usual sterile fashion. A "time-out" was performed as per usual protocol. Loupe magnification was used throughout the procedure. An incision was made just ulnar to the thenar palmar crease. Dissection was carried down through the palmar fascia to the transverse carpal ligament. The transverse carpal ligament was sharply incised, taking care to protect the underlying structures with the carpal tunnel. Complete release of the transverse carpal ligament was achieved. There was no evidence of ganglion cyst or lipoma within the carpal tunnel. The wound was irrigated with copious amounts of normal saline with antibiotic solution. The skin was then re-approximated with interrupted sutures of #5-0 nylon. A sterile dressing was applied followed by application of a volar splint. The tourniquet was deflated with a  total tourniquet time of 49 minutes.  The patient tolerated the procedure well and was transported to the PACU in stable condition.   P. [name]., M.D.

## 2018-10-22 NOTE — Anesthesia Procedure Notes (Signed)
Date/Time: 10/22/2018 4:04 PM Performed by: Nelda Marseille, CRNA Pre-anesthesia Checklist: Patient identified, Emergency Drugs available, Suction available, Patient being monitored and Timeout performed Oxygen Delivery Method: Simple face mask

## 2018-10-22 NOTE — Discharge Instructions (Signed)
°  Instructions after Hand / Wrist Surgery ° ° James P. Hooten, Jr., M.D. ° Dept. of Orthopaedics & Sports Medicine ° [facility] ° [address] ° [place], [place]  [address] ° ° Phone: [phone]   Fax: [phone] ° ° °DIET: °• Drink plenty of non-alcoholic fluids & begin a light diet. °• Resume your normal diet the day after surgery. ° °ACTIVITY:  °• Keep the hand elevated above the level of the elbow. °• Begin gently moving the fingers on a regular basis to avoid stiffness. °• Avoid any heavy lifting, pushing, or pulling with the operative hand. °• Do not drive or operate any equipment until instructed. ° °WOUND CARE:  °• Keep the splint/bandage clean and dry.  °• The splint and stitches will be removed in the office. °• Continue to use the ice packs periodically to reduce pain and swelling. °• You may bathe or shower after the stitches are removed at the first office visit following surgery. ° °MEDICATIONS: °• You may resume your regular medications. °• Please take the pain medication as prescribed. °• Do not take pain medication on an empty stomach. °• Do not drive or drink alcoholic beverages when taking pain medications. ° °CALL THE OFFICE FOR: °• Temperature above 101 degrees °• Excessive bleeding or drainage on the dressing. °• Excessive swelling, coldness, or paleness of the fingers. °• Persistent nausea and vomiting. ° °FOLLOW-UP:  °• You should have an appointment to return to the office in 7-10 days after surgery.  ° °REMEMBER: R.I.C.E. = Rest, Ice, Compression, Elevation !  ° ° ° °AMBULATORY SURGERY  °DISCHARGE INSTRUCTIONS ° ° °1) The drugs that you were given will stay in your system until tomorrow so for the next 24 hours you should not: ° °A) Drive an automobile °B) Make any legal decisions °C) Drink any alcoholic beverage ° ° °2) You may resume regular meals tomorrow.  Today it is better to start with liquids and gradually work up to solid foods. ° °You may eat anything you prefer, but  it is better to start with liquids, then soup and crackers, and gradually work up to solid foods. ° ° °3) Please notify your doctor immediately if you have any unusual bleeding, trouble breathing, redness and pain at the surgery site, drainage, fever, or pain not relieved by medication. ° ° ° °4) Additional Instructions: ° ° ° ° ° ° ° °Please contact your physician with any problems or Same Day Surgery at [phone], Monday through Friday 6 am to 4 pm, or [facility] [place] Main number at [phone]. °

## 2018-10-22 NOTE — Anesthesia Postprocedure Evaluation (Signed)
Anesthesia Post Note  Patient: Matthew Howe  Procedure(s) Performed: CARPAL TUNNEL RELEASE (Right )  Patient location during evaluation: PACU Anesthesia Type: General Level of consciousness: awake and alert Pain management: pain level controlled Vital Signs Assessment: post-procedure vital signs reviewed and stable Respiratory status: spontaneous breathing, nonlabored ventilation, respiratory function stable and patient connected to nasal cannula oxygen Cardiovascular status: blood pressure returned to baseline and stable Postop Assessment: no apparent nausea or vomiting Anesthetic complications: no     Last Vitals:  Vitals:   10/22/18 1720 10/22/18 1735  BP: 108/61 107/67  Pulse: 70 70  Resp: 17 13  Temp:    SpO2: 97% 97%    Last Pain:  Vitals:   10/22/18 1735  TempSrc:   PainSc: 0-No pain                 Spiro Ausborn S

## 2018-10-23 ENCOUNTER — Encounter: Payer: Self-pay | Admitting: Orthopedic Surgery

## 2018-10-24 ENCOUNTER — Encounter: Payer: Self-pay | Admitting: Pharmacist

## 2018-10-24 NOTE — Progress Notes (Signed)
Patient's wife called the heart failure clinic to follow up on a neurology recommendation. Neurology wants to start terazosin for Parkinson's symptoms and recommended patient check with cardiology to stop carvedilol. Patient is taking carvedilol for AF and has preserved ejection fraction heart failure. His blood pressure seems to be usually around 130's/60's. Since patient is not taking carvedilol for heart failure we will not make changes to the dose at this point. Patient may start on terazosin 1 mg po HS with regular blood pressure checks before each dose of carvedilol and was instructed hold his carvedilol dose and to call his primary cardiologist Dr. Rockey Situ if his systolic blood pressure is less than 917, if his diastolic blood pressure is less than 60, or if he experiences significant dizziness especially when changing positions from laying down to sitting or from sitting to standing. I spoke to Mr. Mula' wife and she understands the plan. HF NP aware and in agreement with the above plan.   Hisashi Amadon A. Jordan Hawks, PharmD, BCPS Clinical Pharmacist 10/24/2018 10:48

## 2018-10-29 ENCOUNTER — Other Ambulatory Visit: Payer: Self-pay

## 2018-10-29 MED ORDER — LISINOPRIL 40 MG PO TABS: 40.0000 mg | ORAL_TABLET | Freq: Every day | ORAL | 3 refills | Status: DC

## 2018-11-06 ENCOUNTER — Ambulatory Visit (INDEPENDENT_AMBULATORY_CARE_PROVIDER_SITE_OTHER): Payer: Medicare Other

## 2018-11-06 DIAGNOSIS — I442 Atrioventricular block, complete: Secondary | ICD-10-CM

## 2018-11-07 NOTE — Progress Notes (Signed)
Remote pacemaker transmission.   

## 2018-11-08 ENCOUNTER — Other Ambulatory Visit: Payer: Self-pay

## 2018-11-08 MED ORDER — TORSEMIDE 20 MG PO TABS: 60.0000 mg | ORAL_TABLET | Freq: Two times a day (BID) | ORAL | 6 refills | Status: DC

## 2018-11-09 LAB — CUP PACEART REMOTE DEVICE CHECK
Battery Remaining Longevity: 127 mo
Battery Remaining Percentage: 95.5 %
Battery Voltage: 2.98 V
Brady Statistic AP VP Percent: 32 %
Brady Statistic AP VS Percent: 1 %
Brady Statistic AS VS Percent: 1 %
Brady Statistic RV Percent Paced: 99 %
Date Time Interrogation Session: 20200128112452
Implantable Lead Implant Date: 20160212
Implantable Lead Implant Date: 20160212
Implantable Lead Location: 753859
Implantable Lead Location: 753860
Implantable Lead Model: 1948
Implantable Pulse Generator Implant Date: 20160212
Lead Channel Impedance Value: 490 Ohm
Lead Channel Impedance Value: 580 Ohm
Lead Channel Pacing Threshold Amplitude: 0.625 V
Lead Channel Pacing Threshold Amplitude: 1 V
Lead Channel Pacing Threshold Pulse Width: 0.4 ms
Lead Channel Pacing Threshold Pulse Width: 0.4 ms
Lead Channel Sensing Intrinsic Amplitude: 2.5 mV
Lead Channel Setting Pacing Amplitude: 1.25 V
Lead Channel Setting Pacing Amplitude: 2 V
Lead Channel Setting Pacing Pulse Width: 0.4 ms
Lead Channel Setting Sensing Sensitivity: 4 mV
MDC IDC MSMT LEADCHNL RV SENSING INTR AMPL: 12 mV
MDC IDC STAT BRADY AS VP PERCENT: 68 %
MDC IDC STAT BRADY RA PERCENT PACED: 1 %
Pulse Gen Model: 2240
Pulse Gen Serial Number: 3049931

## 2018-12-28 ENCOUNTER — Inpatient Hospital Stay: Admission: RE | Admit: 2018-12-28 | Payer: Medicare Other | Source: Ambulatory Visit

## 2019-01-03 ENCOUNTER — Ambulatory Visit: Payer: Medicare Other | Admitting: Family

## 2019-01-23 ENCOUNTER — Other Ambulatory Visit: Payer: Self-pay | Admitting: Cardiovascular Disease

## 2019-01-23 NOTE — Telephone Encounter (Signed)
Refill Request.  

## 2019-01-23 NOTE — Telephone Encounter (Signed)
Pt's wt 129.7 kg, age 73, serum creatinine 2.29, CrCl 54.28, due to see Dr. Rockey Situ in June 2020.

## 2019-02-04 ENCOUNTER — Other Ambulatory Visit: Payer: Self-pay

## 2019-02-04 MED ORDER — POTASSIUM CHLORIDE ER 10 MEQ PO TBCR: 30.0000 meq | EXTENDED_RELEASE_TABLET | Freq: Two times a day (BID) | ORAL | 1 refills | Status: DC

## 2019-02-05 ENCOUNTER — Other Ambulatory Visit: Payer: Self-pay

## 2019-02-05 ENCOUNTER — Ambulatory Visit (INDEPENDENT_AMBULATORY_CARE_PROVIDER_SITE_OTHER): Payer: Medicare Other | Admitting: *Deleted

## 2019-02-05 DIAGNOSIS — I5032 Chronic diastolic (congestive) heart failure: Secondary | ICD-10-CM

## 2019-02-05 DIAGNOSIS — I442 Atrioventricular block, complete: Secondary | ICD-10-CM | POA: Diagnosis not present

## 2019-02-05 LAB — CUP PACEART REMOTE DEVICE CHECK
Battery Remaining Longevity: 128 mo
Battery Remaining Percentage: 95.5 %
Battery Voltage: 2.98 V
Brady Statistic AP VP Percent: 32 %
Brady Statistic AP VS Percent: 1 %
Brady Statistic AS VP Percent: 68 %
Brady Statistic AS VS Percent: 1 %
Brady Statistic RA Percent Paced: 1 %
Brady Statistic RV Percent Paced: 99 %
Date Time Interrogation Session: 20200428060259
Implantable Lead Implant Date: 20160212
Implantable Lead Implant Date: 20160212
Implantable Lead Location: 753859
Implantable Lead Location: 753860
Implantable Lead Model: 1948
Implantable Pulse Generator Implant Date: 20160212
Lead Channel Impedance Value: 490 Ohm
Lead Channel Impedance Value: 580 Ohm
Lead Channel Pacing Threshold Amplitude: 0.625 V
Lead Channel Pacing Threshold Amplitude: 1 V
Lead Channel Pacing Threshold Pulse Width: 0.4 ms
Lead Channel Pacing Threshold Pulse Width: 0.4 ms
Lead Channel Sensing Intrinsic Amplitude: 12 mV
Lead Channel Sensing Intrinsic Amplitude: 2.5 mV
Lead Channel Setting Pacing Amplitude: 1.25 V
Lead Channel Setting Pacing Amplitude: 2 V
Lead Channel Setting Pacing Pulse Width: 0.4 ms
Lead Channel Setting Sensing Sensitivity: 4 mV
Pulse Gen Model: 2240
Pulse Gen Serial Number: 3049931

## 2019-02-11 ENCOUNTER — Telehealth: Payer: Self-pay | Admitting: Internal Medicine

## 2019-02-11 NOTE — Telephone Encounter (Signed)
Mr. Rolfe is agreeable to a phone only visit on Friday 02/15/19 at 11:30 am.        Virtual Visit Pre-Appointment Phone Call  "(Name), I am calling you today to discuss your upcoming appointment. We are currently trying to limit exposure to the virus that causes COVID-19 by seeing patients at home rather than in the office."  1. "What is the BEST phone number to call the day of the visit?" - include this in appointment notes  2. "Do you have or have access to (through a family member/friend) a smartphone with video capability that we can use for your visit?" a. If yes - list this number in appt notes as "cell" (if different from BEST phone #) and list the appointment type as a VIDEO visit in appointment notes b. If no - list the appointment type as a PHONE visit in appointment notes  3. Confirm consent - "In the setting of the current Covid19 crisis, you are scheduled for a (phone or video) visit with your provider on (date) at (time).  Just as we do with many in-office visits, in order for you to participate in this visit, we must obtain consent.  If you'd like, I can send this to your mychart (if signed up) or email for you to review.  Otherwise, I can obtain your verbal consent now.  All virtual visits are billed to your insurance company just like a normal visit would be.  By agreeing to a virtual visit, we'd like you to understand that the technology does not allow for your provider to perform an examination, and thus may limit your provider's ability to fully assess your condition. If your provider identifies any concerns that need to be evaluated in person, we will make arrangements to do so.  Finally, though the technology is pretty good, we cannot assure that it will always work on either your or our end, and in the setting of a video visit, we may have to convert it to a phone-only visit.  In either situation, we cannot ensure that we have a secure connection.  Are you willing to proceed?"  STAFF: Did the patient verbally acknowledge consent to telehealth visit? Document YES/NO here: Yes (verbal)  4. Advise patient to be prepared - "Two hours prior to your appointment, go ahead and check your blood pressure, pulse, oxygen saturation, and your weight (if you have the equipment to check those) and write them all down. When your visit starts, your provider will ask you for this information. If you have an Apple Watch or Kardia device, please plan to have heart rate information ready on the day of your appointment. Please have a pen and paper handy nearby the day of the visit as well."  5. Give patient instructions for MyChart download to smartphone OR [email or web address] as below if video visit (depending on what platform provider is using)  6. Inform patient they will receive a phone call 15 minutes prior to their appointment time (may be from unknown caller ID) so they should be prepared to answer    TELEPHONE CALL NOTE  DANNER PAULDING has been deemed a candidate for a follow-up tele-health visit to limit community exposure during the Covid-19 pandemic. I spoke with the patient via phone to ensure availability of phone/video source, confirm preferred email & phone number, and discuss instructions and expectations.  I reminded RYLON POITRA to be prepared with any vital sign and/or heart rhythm information that could potentially be obtained  via home monitoring, at the time of his visit. I reminded YANDELL MCJUNKINS to expect a phone call prior to his visit.  Alvis Lemmings, RN 02/11/2019 4:12 PM   INSTRUCTIONS FOR DOWNLOADING THE MYCHART APP TO SMARTPHONE  - The patient must first make sure to have activated MyChart and know their login information - If Apple, go to [employer] and type in MyChart in the search bar and download the app. If Android, ask patient to go to [employer] and type in [place] in the search bar and download the app. The app is free but as with any other app  downloads, their phone may require them to verify saved payment information or Apple/Android password.  - The patient will need to then log into the app with their MyChart username and password, and select [facility] as their healthcare provider to link the account. When it is time for your visit, go to the MyChart app, find appointments, and click Begin Video Visit. Be sure to Select Allow for your device to access the Microphone and Camera for your visit. You will then be connected, and your provider will be with you shortly.  **If they have any issues connecting, or need assistance please contact MyChart service desk [phone])**  **If using a computer, in order to ensure the best quality for their visit they will need to use either of the following Internet Browsers: [employer], or Google Chrome**  IF USING DOXIMITY or DOXY.ME - The patient will receive a link just prior to their visit by text.     FULL LENGTH CONSENT FOR TELE-HEALTH VISIT   I hereby voluntarily request, consent and authorize [facility] and its employed or contracted physicians, physician assistants, nurse practitioners or other licensed health care professionals (the Practitioner), to provide me with telemedicine health care services (the "Services") as deemed necessary by the treating Practitioner. I acknowledge and consent to receive the Services by the Practitioner via telemedicine. I understand that the telemedicine visit will involve communicating with the Practitioner through live audiovisual communication technology and the disclosure of certain medical information by electronic transmission. I acknowledge that I have been given the opportunity to request an in-person assessment or other available alternative prior to the telemedicine visit and am voluntarily participating in the telemedicine visit.  I understand that I have the right to withhold or withdraw my consent to the use of telemedicine  in the course of my care at any time, without affecting my right to future care or treatment, and that the Practitioner or I may terminate the telemedicine visit at any time. I understand that I have the right to inspect all information obtained and/or recorded in the course of the telemedicine visit and may receive copies of available information for a reasonable fee.  I understand that some of the potential risks of receiving the Services via telemedicine include:  [name] Delay or interruption in medical evaluation due to technological equipment failure or disruption; . Information transmitted may not be sufficient (e.g. poor resolution of images) to allow for appropriate medical decision making by the Practitioner; and/or  . In rare instances, security protocols could fail, causing a breach of personal health information.  Furthermore, I acknowledge that it is my responsibility to provide information about my medical history, conditions and care that is complete and accurate to the best of my ability. I acknowledge that Practitioner's advice, recommendations, and/or decision may be based on factors not within their control, such as  incomplete or inaccurate data provided by me or distortions of diagnostic images or specimens that may result from electronic transmissions. I understand that the practice of medicine is not an exact science and that Practitioner makes no warranties or guarantees regarding treatment outcomes. I acknowledge that I will receive a copy of this consent concurrently upon execution via email to the email address I last provided but may also request a printed copy by calling the office of [facility].    I understand that my insurance will be billed for this visit.   I have read or had this consent read to me. . I understand the contents of this consent, which adequately explains the benefits and risks of the Services being provided via telemedicine.  . I have been provided ample  opportunity to ask questions regarding this consent and the Services and have had my questions answered to my satisfaction. . I give my informed consent for the services to be provided through the use of telemedicine in my medical care  By participating in this telemedicine visit I agree to the above.

## 2019-02-11 NOTE — Telephone Encounter (Signed)
I spoke with the patient and his wife.  The patient confirms he has been having a lot of fatigue and some increased SOB with exertion.  He does notice some mild lower extremity swelling and abdominal fullness.  His weight is pretty stable ~ 278 lbs.  I have advised the patient that we would like to move up his 5/19 appt with Dr. Caryl Comes to an e-visit sooner.  He is agreeable with an e-visit on 02/15/19 at 11:30 am.

## 2019-02-11 NOTE — Telephone Encounter (Signed)
I attempted to call the patient. He is currently not home per his wife. OK to speak with Mrs. Sanor per DPR. I advised Mrs. Kalmbach of the findings per Dr. Caryl Comes on the patients most recent transmission.   Mrs. Lebarron did say the patient has had a lot of fatigue lately and sleeps a lot.  I advised her that an e-visit with Dr. Caryl Comes would be warranted if the patient is agreeable.  Per Mrs. Wexler, the patient is scheduled for an appt on 5/19 with Dr. Caryl Comes. She will have him call me back in a lttle bit to discuss an e-visit further will move up sooner if possible.

## 2019-02-11 NOTE — Telephone Encounter (Signed)
Notes recorded by Deboraha Sprang, MD on 02/08/2019 at 9:54 PM EDT Remote reviewed. This remote is abnormal for persistent atrial fibrillation Since Nove  H Could you please call him and see how he is doing in this  Afib pressent since NOV  Maybe we should do Telehealth visit  Thanks sk

## 2019-02-13 ENCOUNTER — Telehealth: Payer: Self-pay | Admitting: Internal Medicine

## 2019-02-13 MED ORDER — LISINOPRIL 40 MG PO TABS: 20.0000 mg | ORAL_TABLET | Freq: Every day | ORAL | 3 refills | Status: DC

## 2019-02-13 NOTE — Telephone Encounter (Signed)
Pt c/o BP issue: STAT if pt c/o blurred vision, one-sided weakness or slurred speech  1. What are your last 5 BP readings?  90/56 HR 68   2. Are you having any other symptoms (ex. Dizziness, headache, blurred vision, passed out)? Weak tired doesn't feel good slight sob   3. What is your BP issue? Concerned what can be done for hypotension

## 2019-02-13 NOTE — Telephone Encounter (Signed)
I would have him 1)cut his lisinopril 40>>20 and take it at night And we can discuss other medication changes on Fri  Thanks

## 2019-02-13 NOTE — Telephone Encounter (Signed)
Returned call to pts wife, Mardene Celeste.   Ongoing a fib, pacemaker dysfunction. 2 days of low energy, staying in bed all day. BP this morning 90/56, HR 68. Slight SOB has continued since last triage call.   He does not have chest pain, dizziness or fever. Ambulates around the house without difficulty, "just completely drained".   Has tele visit with Dr. Caryl Comes on Friday at 11:30 AM. Wife wants to know if any medication should be held to increase blood pressure.   Routing to provider to further advise.

## 2019-02-13 NOTE — Telephone Encounter (Signed)
Returned call to wife with recommendations from Dr. Caryl Comes.   She verbalized understanding and will speak to Dr. Caryl Comes further at e visit appt.   She agreed to continue taking Bps daily and will report if anything changes/worsens.   Advised pt to call for any further questions or concerns.

## 2019-02-14 NOTE — Progress Notes (Signed)
Remote pacemaker transmission.   

## 2019-02-15 ENCOUNTER — Telehealth (INDEPENDENT_AMBULATORY_CARE_PROVIDER_SITE_OTHER): Payer: Medicare Other | Admitting: Internal Medicine

## 2019-02-15 ENCOUNTER — Encounter: Payer: Self-pay | Admitting: Internal Medicine

## 2019-02-15 ENCOUNTER — Other Ambulatory Visit: Payer: Self-pay

## 2019-02-15 VITALS — BP 92/55 | HR 69 | Ht 73.0 in | Wt 272.0 lb

## 2019-02-15 DIAGNOSIS — N183 Chronic kidney disease, stage 3 unspecified: Secondary | ICD-10-CM

## 2019-02-15 DIAGNOSIS — I959 Hypotension, unspecified: Secondary | ICD-10-CM

## 2019-02-15 DIAGNOSIS — I442 Atrioventricular block, complete: Secondary | ICD-10-CM | POA: Diagnosis not present

## 2019-02-15 DIAGNOSIS — D649 Anemia, unspecified: Secondary | ICD-10-CM

## 2019-02-15 DIAGNOSIS — Z950 Presence of cardiac pacemaker: Secondary | ICD-10-CM

## 2019-02-15 DIAGNOSIS — I480 Paroxysmal atrial fibrillation: Secondary | ICD-10-CM

## 2019-02-15 MED ORDER — CARVEDILOL 12.5 MG PO TABS: 6.2500 mg | ORAL_TABLET | Freq: Two times a day (BID) | ORAL | 3 refills | Status: DC

## 2019-02-15 MED ORDER — TORSEMIDE 20 MG PO TABS: 60.0000 mg | ORAL_TABLET | Freq: Every day | ORAL | 6 refills | Status: DC

## 2019-02-15 MED ORDER — POTASSIUM CHLORIDE ER 10 MEQ PO TBCR: 30.0000 meq | EXTENDED_RELEASE_TABLET | Freq: Every day | ORAL | 1 refills | Status: DC

## 2019-02-15 NOTE — Progress Notes (Signed)
Electrophysiology TeleHealth Note   Due to national recommendations of social distancing due to COVID 19, an audio/video telehealth visit is felt to be most appropriate for this patient at this time.  See MyChart message from today for the patient's consent to telehealth for Matthew Howe.   Date:  02/15/2019   ID:  Matthew Howe, Matthew Howe 1946/09/28, MRN [id number]  Location: patient's home  Provider location: [address], [place]  Evaluation Performed: Follow-up visit  PCP:  Matthew Hire, MD  Cardiologist:  Matthew Howe:  Matthew Howe   Chief Complaint:   Atrial fibrillation   History of Present Illness:    Matthew Howe is a 73 y.o. male who presents via audio/video conferencing for a telehealth visit today.  Since last being seen in our clinic for tachybrady syndome s/p St Jude  pacemaker, the patient reports issues of weakness with low blood pressure and a device interrogation demonstrated persistent atrial fibrillation  Last week his wife called him with blood pressures of 90/56>> recommended decreasing lisinopril  Blood pressures initially improved.  Again in the high 90s again. no fever chill No change in Meds  Sleeping a lot.  After meals, in a chair.  Not typical.  No chest pain.  No peripheral edema.  Lightheadedness.    Catheterization 2/14 had demonstrated mild nonobstructive disease. DATE TEST EF   10/16 echo 55-60 %   3/18 Echo 55-60 %         Date Cr K Hgb  2/18 1.36  14.7  1/19    4.5 13.3  3/19   12.4  12/19 2.29 4.4 10.7  1/20 1.8 4.9     Wt 251 6/19>>270 9/19>>285 1/20 >> 272          The patient denies symptoms of fevers, chills, cough, or new SOB worrisome for COVID 19.    Past Medical History:  Diagnosis Date  . Atrial fibrillation (Matthew Howe)   . Carpal tunnel syndrome on both sides   . CHF (congestive heart failure) ([place])   . CKD (chronic kidney disease) 10/2018   recent diagnosis  . Complete heart block  ([place])    a. s/p St. Jude PPM 11/2014.  [name] Complication of anesthesia    stayed overnight dt under too long d/t taking Parkinson's medication (2013?)  . Coronary artery disease   . GERD (gastroesophageal reflux disease)    "not as bad as it used to be"  . Heart murmur   . History of recent blood transfusion 07/2018  . Hyperlipidemia   . Hypertension   . Parkinson's disease ([place])   . Presence of permanent cardiac pacemaker    St Jude  . Shortness of breath dyspnea     Past Surgical History:  Procedure Laterality Date  . APPENDECTOMY  12/2010  . BACK SURGERY  08/06/2018   lower lumbarsacral region, has rods in place. (done at [facility])  . CARDIAC CATHETERIZATION  11/20/2012   Known CAD with one vessel coronary disease of left descending artery by cardiac cath.   [name] CARDIOVERSION N/A 11/21/2016   Procedure: CARDIOVERSION;  Surgeon: Matthew Sprang, MD;  Location: [facility] ORS;  Service: Cardiovascular;  Laterality: N/A;  . CARPAL TUNNEL RELEASE Right 10/22/2018   Procedure: CARPAL TUNNEL RELEASE;  Surgeon: Matthew Leep, MD;  Location: [facility] ORS;  Service: Orthopedics;  Laterality: Right;  . COLONOSCOPY  2018  . INSERT / REPLACE / REMOVE PACEMAKER    . PACEMAKER INSERTION  11-21-14   STJ Assurity dual chamber pacemaker implanted by Matthew Howe for CHB  . PERMANENT PACEMAKER INSERTION N/A 11/21/2014   Procedure: PERMANENT PACEMAKER INSERTION;  Surgeon: Matthew Grayer, MD;  Location: [facility] CATH LAB;  Service: Cardiovascular;  Laterality: N/A;  . TEE WITHOUT CARDIOVERSION N/A 11/21/2016   Procedure: TRANSESOPHAGEAL ECHOCARDIOGRAM (TEE);  Surgeon: Matthew Sprang, MD;  Location: [facility] ORS;  Service: Cardiovascular;  Laterality: N/A;  . UPPER GI ENDOSCOPY      Current Outpatient Medications  Medication Sig Dispense Refill  . acetaminophen (TYLENOL) 500 MG tablet Take 500-1,000 mg by mouth every 6 (six) hours as needed (back pain.).     [name] allopurinol (ZYLOPRIM) 100 MG tablet Take 100 mg by mouth daily.     [name] allopurinol (ZYLOPRIM) 300 MG tablet Take 300 mg by mouth daily.  3  . bisacodyl (DULCOLAX) 5 MG EC tablet Take 5 mg by mouth daily as needed for moderate constipation.    . carboxymethylcellulose (REFRESH PLUS) 0.5 % SOLN Place 1 drop into both eyes daily as needed (dry eyes).     . carvedilol (COREG) 12.5 MG tablet TAKE 1 TABLET (12.5 MG TOTAL) BY MOUTH 2 (TWO) TIMES DAILY. 180 tablet 3  . desloratadine (CLARINEX) 5 MG tablet Take 5 mg by mouth daily.    [name] dicyclomine (BENTYL) 20 MG tablet Take 20 mg by mouth 3 (three) times daily with meals.     [name] ELIQUIS 5 MG TABS tablet TAKE 1 TABLET BY MOUTH TWICE A DAY 180 tablet 0  . fluticasone (FLONASE) 50 MCG/ACT nasal spray Place 2 sprays into the nose daily as needed for allergies.     [name] lansoprazole (PREVACID) 15 MG capsule Take 15 mg by mouth daily as needed (indigestion).     [name] lisinopril (ZESTRIL) 40 MG tablet Take 0.5 tablets (20 mg total) by mouth daily. 90 tablet 3  . magnesium oxide (MAG-OX) 400 MG tablet Take 400 mg by mouth at bedtime.    . meloxicam (MOBIC) 15 MG tablet Take 15 mg by mouth every other day.     . metolazone (ZAROXOLYN) 2.5 MG tablet TAKE 1 TABLET BY MOUTH DAILY AS NEEDED (ONCE DAILY AS NEEDED FOR WEIGHT GAIN OR SWELLING.). 90 tablet 1  . Multiple Vitamins-Minerals (OCUVITE ADULT 50+ PO) Take 1 tablet by mouth daily.    . polyethylene glycol (MIRALAX / GLYCOLAX) packet Take 17 g by mouth daily as needed (constipation.).     [name] potassium chloride (K-DUR) 10 MEQ tablet Take 3 tablets (30 mEq total) by mouth 2 (two) times daily. 540 tablet 1  . pramipexole (MIRAPEX) 0.5 MG tablet Take 0.5 mg by mouth 3 (three) times daily.     . Red Yeast Rice 600 MG CAPS Take 600 mg by mouth daily.    . selegiline (ELDEPRYL) 5 MG capsule Take 5 mg by mouth 2 (two) times daily with a meal.     . torsemide (DEMADEX) 20 MG tablet Take 3 tablets (60 mg total) by mouth 2 (two) times daily. 180 tablet 6  . traMADol (ULTRAM) 50 MG tablet Take 1  tablet (50 mg total) by mouth every 6 (six) hours as needed for moderate pain. 10 tablet 0   No current facility-administered medications for this visit.     Allergies:   Linzess [linaclotide]; Codeine; Terazosin; Amitiza [lubiprostone]; Gabapentin; Norco [hydrocodone-acetaminophen]; Omeprazole; and Requip [ropinirole]   Social History:  The patient  reports that he has never smoked. He has never used smokeless tobacco.  He reports that he does not drink alcohol or use drugs.   Family History:  The patient's   family history includes Colon cancer in his father; Heart disease in his father and mother; Stroke in his sister.   ROS:  Please see the history of present illness.   All other systems are personally reviewed and negative.    Exam:    Vital Signs:  BP (!) 92/55   Pulse 69   Ht 6\' 1"  (1.854 m)   Wt 272 lb (123.4 kg)   BMI 35.89 kg/m     Well appearing, alert and conversant, regular work of breathing,  good skin color Eyes- anicteric, neuro- grossly intact, skin- no apparent rash or lesions or cyanosis, mouth- oral mucosa is pink Device pocket well healed; without hematoma or erythema.  There is no tethering  Labs/Other Tests and Data Reviewed:    Recent Labs: 10/08/2018: BUN 69; Creatinine, Ser 2.29; Hemoglobin 10.7; Platelets 284; Potassium 4.4; Sodium 141   Wt Readings from Last 3 Encounters:  02/15/19 272 lb (123.4 kg)  10/22/18 285 lb 15 oz (129.7 kg)  10/08/18 286 lb (129.7 kg)     Other studies personally reviewed: Additional studies/ records that were reviewed today include: As above  Review of the above records today demonstrates:As above     *  Last device remote is reviewed from [facility] PDF dated4/20  which reveals normal device function,   arrhythmias - persistent atrial fib  *   ASSESSMENT & PLAN:    Complete heart block  Pacemaker-St. Jude    Parkinson's disease  Hypertension  Hypertensive heart disease   HFpEF  Anemia  Weakness   Hypotension  Renal dysfunction  Grade 3-4   Atrial fibrillation-persistent- long term      The patient has ongoing issues with weakness and fatigue with objective hypotension.  This raises a concern of renal function in the context of his ACE inhibitor, worsening anemia, and infection related to his device. He also had persistent atrial fibrillation for now few months; his acute decompensation is however over the last couple of weeks making this a less likely culprit but a likely contributor.  His weights are variable.  1 year ago he was measured to 250, 4 months ago in the mid to 51s and now at 41 or so for the last couple of weeks.  Not likely significantly volume overloaded  Would anticipate cardioversion; however, need to get a few other issues addressed first.  Is to see his PCP on Monday.  Would ask that we recheck his Hgb and iron studies.  It has been ascribed to his renal insufficiency but particularly in the context of anticoagulation him with a diagnosis of exclusion  Would ask also that we check his renal function.  In the interim I have asked him to discontinue his lisinopril  Given the potential for device infection, and aware that it can present in a number of different ways including hypertension, would asked that we also draw blood cultures next week.  Look forward to being in touch with Matthew. Edwina Barth   COVID 19 screen The patient denies symptoms of COVID 19 at this time.  The importance of social distancing was discussed today.  Follow-up:  2weeks repeat telehealth visit   Next remote: As Scheduled   Current medicines are reviewed at length with the patient today.   The patient does not have concerns regarding his medicines.  The following changes were made today:   1-stop  lisinopril 2-decrease carvedilol 12.5--6.25; did not mention this to the family 3-decrease torsemide to once a day  Labs/ tests ordered today include: Have asked his family to ask of his  PCP CBC, iron studies, metabolic profile, blood cultures. No orders of the defined types were placed in this encounter.      Patient Risk:  after full review of this patients clinical status, I feel that they are at moderate risk at this time.  Today, I have spent 12 minutes with the patient with telehealth technology discussing the above.  Signed, Virl Axe, MD  02/15/2019 11:38 AM     [address] [place]  [address] [phone] (office) [phone] (fax)

## 2019-02-15 NOTE — Patient Instructions (Signed)
Called and discussed the following with pt's wife. She repeated and verbalized understanding.   Medication Instructions:  Your physician has recommended you make the following change in your medication:   1. Stop Lisinopril 2. Decrease Torsemide to 60mg  once daily. Decrease Potassium to once daily 3. Decrease Carvedilol to 6.25mg , two times per day.  Labwork: Your physician recommends that you return for lab work in: CBC, BMP, Iron Studies, and Blood cultures to be done by pts PCP next week.  Testing/Procedures: None ordered.   Follow-Up:   Friday May 22 at 10:30am with a virtual visit with Dr Caryl Comes  Any Other Special Instructions Will Be Listed Below (If Applicable).     If you need a refill on your cardiac medications before your next appointment, please call your pharmacy.

## 2019-02-22 ENCOUNTER — Inpatient Hospital Stay: Admission: RE | Admit: 2019-02-22 | Payer: Medicare Other | Source: Ambulatory Visit

## 2019-02-26 ENCOUNTER — Telehealth: Payer: Medicare Other | Admitting: Internal Medicine

## 2019-03-01 ENCOUNTER — Encounter: Payer: Self-pay | Admitting: Internal Medicine

## 2019-03-01 ENCOUNTER — Other Ambulatory Visit: Payer: Self-pay

## 2019-03-01 ENCOUNTER — Telehealth (INDEPENDENT_AMBULATORY_CARE_PROVIDER_SITE_OTHER): Payer: Medicare Other | Admitting: Internal Medicine

## 2019-03-01 VITALS — BP 160/76 | HR 68 | Ht 73.0 in | Wt 269.0 lb

## 2019-03-01 DIAGNOSIS — I5032 Chronic diastolic (congestive) heart failure: Secondary | ICD-10-CM

## 2019-03-01 DIAGNOSIS — I442 Atrioventricular block, complete: Secondary | ICD-10-CM

## 2019-03-01 DIAGNOSIS — Z950 Presence of cardiac pacemaker: Secondary | ICD-10-CM

## 2019-03-01 DIAGNOSIS — I4819 Other persistent atrial fibrillation: Secondary | ICD-10-CM

## 2019-03-01 DIAGNOSIS — I959 Hypotension, unspecified: Secondary | ICD-10-CM

## 2019-03-01 DIAGNOSIS — I10 Essential (primary) hypertension: Secondary | ICD-10-CM

## 2019-03-01 NOTE — Patient Instructions (Addendum)
Called and discussed the following recommendations with pt and his spouse:  Medication Instructions:  Your physician recommends that you continue on your current medications as directed. Please refer to the Current Medication list given to you today.  Labwork: Your physician recommends that you return for lab work on Tuesday May 26; CBC and BMP [facility]. You may come any time that day from 8:00am to 4:30pm  You will need to have your pre procedural COVID screening performed at our [facility] drive up screening tent on Tues May 26 between 10:30am and 4:00pm. Please have your blood work drawn first, then drive to [employer] building for your screening.  Testing/Procedures: Your physician has recommended that you have a Cardioversion (DCCV). Electrical Cardioversion uses a jolt of electricity to your heart either through paddles or wired patches attached to your chest. This is a controlled, usually prescheduled, procedure. Defibrillation is done under light anesthesia in the hospital, and you usually go home the day of the procedure. This is done to get your heart back into a normal rhythm. You are not awake for the procedure. Please see the instruction sheet given to you today.  Follow-Up: Your physician recommends that you have a follow up appointment on July at 9 at 9:30am with Dr Caryl Comes. This is scheduled as an office visit, but may be changed to a virtual visit as the time approaches. We will let you know.    Any Other Special Instructions Will Be Listed Below    You are scheduled for a Cardioversion on May 29 with Dr.Ross.  Please arrive at the [facility] (Main Entrance A) at [facility]: [address] [place], [place] [address] at 6:30am.  DIET: Nothing to eat or drink after midnight except a sip of water with medications (see medication instructions below)  Medication Instructions: Hold all medications the morning of your cardioversion EXCEPT  Eliquis  Continue your anticoagulant: Eliquis. Do not skip a dose. You will need to continue your anticoagulant after your procedure until you are told by your Provider that it is safe to stop   Labs:   Come to: [facility] on Tues May 26 between 8am and 4:30pm for a BMP and CBC  Come to the [facility] COVID screening center on Tues May 26 between 10:30am and 4:00pm for your pre screening. **Note** you will have to adhere to strict quarantine from the time you have you screening and to your procedure date/time. Please do not allow outside visitors in your home and avoid public areas. If you must go in public, you are required to wear a mask.  You must have a responsible person to drive you home and stay in the waiting area during your procedure. Failure to do so could result in cancellation.  Bring your insurance cards.  *Special Note: Every effort is made to have your procedure done on time. Occasionally there are emergencies that occur at the hospital that may cause delays. Please be patient if a delay does occur.    If you need a refill on your cardiac medications before your next appointment, please call your pharmacy.

## 2019-03-01 NOTE — H&P (View-Only) (Signed)
Electrophysiology TeleHealth Note   Due to national recommendations of social distancing due to COVID 19, an audio/video telehealth visit is felt to be most appropriate for this patient at this time.  See MyChart message from today for the patient's consent to telehealth for [facility].   Date:  03/01/2019   ID:  Matthew, Howe [birth year], MRN [id number]  Location: patient's home  Provider location: [address], [place]  Evaluation Performed: Follow-up visit  PCP:  Matthew Hire, MD  Cardiologist:     Electrophysiologist:  SK   Chief Complaint:  CHF  History of Present Illness:    Matthew Howe is a 73 y.o. male who presents via audio/video conferencing for a telehealth visit today.  Since last being seen in our clinic, the patient reports significant problems with volume overload addressed with changing of his diuretics and hypotension addressed over the telephone by the discontinuation of his lisinopril.  Had back surgery last fall.  This is associated with a 25 pound weight gain.  Significant diuresis over the last week and weights 268 range with scant edema.  Still however with profound fatigue.  Tired tomorrow when he walks 10-20 minutes today.  Catheterization 2/14 had demonstrated mild nonobstructive disease. DATE TEST    10/16    echo   EF 55-60 %   3/18    Echo   EF 55-60 %         Date Cr K Hgb  2/18 1.36  14.7  1/19  0.38 4.5 13.3  3/19   12.4  12/19 2.29 4.4 10.7  5/20 2.0 4.3 13.2   Surprisingly good  The patient denies symptoms of fevers, chills, cough, or new SOB worrisome for COVID 19   Past Medical History:  Diagnosis Date   Atrial fibrillation ([place])    Carpal tunnel syndrome on both sides    CHF (congestive heart failure) ([place])    CKD (chronic kidney disease) 10/2018   recent diagnosis   Complete heart block ([place])    a. s/p St. Jude PPM 11/2014.   Complication of anesthesia    stayed overnight  dt under too long d/t taking Parkinson's medication (2013?)   Coronary artery disease    GERD (gastroesophageal reflux disease)    "not as bad as it used to be"   Heart murmur    History of recent blood transfusion 07/2018   Hyperlipidemia    Hypertension    Parkinson's disease ([place])    Presence of permanent cardiac pacemaker    St Jude   Shortness of breath dyspnea     Past Surgical History:  Procedure Laterality Date   APPENDECTOMY  12/2010   BACK SURGERY  08/06/2018   lower lumbarsacral region, has rods in place. (done at [facility])   [place]  11/20/2012   Known CAD with one vessel coronary disease of left descending artery by cardiac cath.    CARDIOVERSION N/A 11/21/2016   Procedure: CARDIOVERSION;  Surgeon: Deboraha Sprang, MD;  Location: [facility] ORS;  Service: Cardiovascular;  Laterality: N/A;   CARPAL TUNNEL RELEASE Right 10/22/2018   Procedure: CARPAL TUNNEL RELEASE;  Surgeon: Dereck Leep, MD;  Location: [facility] ORS;  Service: Orthopedics;  Laterality: Right;   COLONOSCOPY  2018   INSERT / REPLACE / REMOVE PACEMAKER     PACEMAKER INSERTION  11-21-14   STJ Assurity dual chamber pacemaker implanted by Dr Rayann Heman for CHB   PERMANENT PACEMAKER INSERTION  N/A 11/21/2014   Procedure: PERMANENT PACEMAKER INSERTION;  Surgeon: Thompson Grayer, MD;  Location: [facility] CATH LAB;  Service: Cardiovascular;  Laterality: N/A;   TEE WITHOUT CARDIOVERSION N/A 11/21/2016   Procedure: TRANSESOPHAGEAL ECHOCARDIOGRAM (TEE);  Surgeon: Deboraha Sprang, MD;  Location: [facility] ORS;  Service: Cardiovascular;  Laterality: N/A;   UPPER GI ENDOSCOPY      Current Outpatient Medications  Medication Sig Dispense Refill   acetaminophen (TYLENOL) 500 MG tablet Take 500-1,000 mg by mouth every 6 (six) hours as needed (back pain.).      allopurinol (ZYLOPRIM) 100 MG tablet Take 100 mg by mouth daily.     allopurinol (ZYLOPRIM) 300 MG tablet Take 300 mg by mouth daily.  3   bisacodyl  (DULCOLAX) 5 MG EC tablet Take 5 mg by mouth daily as needed for moderate constipation.     carboxymethylcellulose (REFRESH PLUS) 0.5 % SOLN Place 1 drop into both eyes daily as needed (dry eyes).      carvedilol (COREG) 12.5 MG tablet Take 0.5 tablets (6.25 mg total) by mouth 2 (two) times daily.  3   desloratadine (CLARINEX) 5 MG tablet Take 5 mg by mouth daily.     dicyclomine (BENTYL) 20 MG tablet Take 20 mg by mouth 3 (three) times daily with meals.      ELIQUIS 5 MG TABS tablet TAKE 1 TABLET BY MOUTH TWICE A DAY 180 tablet 0   fluticasone (FLONASE) 50 MCG/ACT nasal spray Place 2 sprays into the nose daily as needed for allergies.      lansoprazole (PREVACID) 15 MG capsule Take 15 mg by mouth daily as needed (indigestion).      magnesium oxide (MAG-OX) 400 MG tablet Take 400 mg by mouth at bedtime.     meloxicam (MOBIC) 15 MG tablet Take 15 mg by mouth every other day.      metolazone (ZAROXOLYN) 2.5 MG tablet TAKE 1 TABLET BY MOUTH DAILY AS NEEDED (ONCE DAILY AS NEEDED FOR WEIGHT GAIN OR SWELLING.). 90 tablet 1   Multiple Vitamins-Minerals (OCUVITE ADULT 50+ PO) Take 1 tablet by mouth daily.     polyethylene glycol (MIRALAX / GLYCOLAX) packet Take 17 g by mouth daily as needed (constipation.).      potassium chloride (K-DUR) 10 MEQ tablet Take 30 mEq by mouth 2 (two) times daily.     pramipexole (MIRAPEX) 0.5 MG tablet Take 0.5 mg by mouth 3 (three) times daily.      selegiline (ELDEPRYL) 5 MG capsule Take 5 mg by mouth 2 (two) times daily with a meal.      torsemide (DEMADEX) 20 MG tablet Take 60 mg by mouth 2 (two) times daily.     Red Yeast Rice 600 MG CAPS Take 600 mg by mouth daily.     No current facility-administered medications for this visit.     Allergies:   Linzess [linaclotide]; Codeine; Terazosin; Amitiza [lubiprostone]; Gabapentin; Norco [hydrocodone-acetaminophen]; Omeprazole; and Requip [ropinirole]   Social History:  The patient  reports that he has  never smoked. He has never used smokeless tobacco. He reports that he does not drink alcohol or use drugs.   Family History:  The patient's   family history includes Colon cancer in his father; Heart disease in his father and mother; Stroke in his sister.   ROS:  Please see the history of present illness.   All other systems are personally reviewed and negative.    Exam:    Vital Signs:  BP (!) 160/76  Pulse 68    Ht 6\' 1"  (1.854 m)    Wt 269 lb (122 kg)    BMI 35.49 kg/m     Well appearing, alert and conversant, regular work of breathing,  good skin color Eyes- anicteric, neuro- grossly intact, skin- no apparent rash or lesions or cyanosis, mouth- oral mucosa is pink   Labs/Other Tests and Data Reviewed:    Recent Labs: 10/08/2018: BUN 69; Creatinine, Ser 2.29; Hemoglobin 10.7; Platelets 284; Potassium 4.4; Sodium 141   Wt Readings from Last 3 Encounters:  03/01/19 269 lb (122 kg)  02/15/19 272 lb (123.4 kg)  10/22/18 285 lb 15 oz (129.7 kg)     Other studies personally reviewed: Additional studies/ records that were reviewed today include: As above   Last device remote is reviewed from [place] PDF dated4/20 which reveals normal device function,   arrhythmias - presistent atrial fib **   ASSESSMENT & PLAN:    Complete heart block  Pacemaker-St. Jude    Parkinson's disease  Hypertension  Renal insufficiency grade 3  Hypertensive heart and kidney disease   HFpEF  Atrial fibrillation-persistent long term  Persistent atrial fibrillation we will be contributing to his heart failure.  We will need to undertake cardioversion.  His echocardiogram was notable for significant hypertensive heart disease with septal wall thickness of 17 mm.  We will plan to repeat his echocardiogram with contrast to accurately assess LVH; in the event that he has this degree of hypertrophy will use amiodarone as an antiarrhythmic drug.  Otherwise in the absence of coronary disease  would use a 1C  He is anticoagulated.  We will anticipate postponing his carpal tunnel surgery so as to be able to accomplish restoration of sinus rhythm.  Reviewing the burden of atrial fibrillation, there is intermittent atrial fibrillation so I think antiarrhythmic drug therapy is indicated in the short-term for the maintenance of sinus rhythm.  Reassessment of his left atrial size will also inform recommendations regarding catheter ablation in this relatively young man.  For now, we will continue him on his diuretics.  We will leave him off of his lisinopril as his blood pressure is 140s-160s.  If it remains elevated, will have to resume a low-dose of lisinopril avoiding the higher doses which was associated with profound hypotension last week.  He is significantly deconditioned.  I recommend that he begin walking 3 times a day for 5 minutes and next week increase it to 6 minutes etc. etc.  COVID 19 screen The patient denies symptoms of COVID 19 at this time.  The importance of social distancing was discussed today.  Follow-up:  2 m Next remote: As Scheduled   Current medicines are reviewed at length with the patient today.   The patient does not have concerns regarding his medicines.  The following changes were made today:   Continue off lisinopril  Continue diuretic torsemide 60 bid  Labs/ tests ordered today include: echo SOON [place]  No orders of the defined types were placed in this encounter.   Future tests ( post COVID )   s  Patient Risk:  after full review of this patients clinical status, I feel that they are at moderate risk at this time.  Today, I have spent 21 minutes with the patient with telehealth technology discussing the above.  Signed, Virl Axe, MD  03/01/2019 11:14 AM     [facility] [address] [place] [address] [phone] (office) [phone] (fax)

## 2019-03-01 NOTE — Progress Notes (Signed)
Electrophysiology TeleHealth Note   Due to national recommendations of social distancing due to COVID 19, an audio/video telehealth visit is felt to be most appropriate for this patient at this time.  See MyChart message from today for the patient's consent to telehealth for [facility].   Date:  03/01/2019   ID:  Matthew, Howe Aug 20, 1946, MRN [id number]  Location: patient's home  Provider location: [address], [place]  Evaluation Performed: Follow-up visit  PCP:  Baxter Hire, MD  Cardiologist:     Electrophysiologist:  SK   Chief Complaint:  CHF  History of Present Illness:    Matthew Howe is a 73 y.o. male who presents via audio/video conferencing for a telehealth visit today.  Since last being seen in our clinic, the patient reports significant problems with volume overload addressed with changing of his diuretics and hypotension addressed over the telephone by the discontinuation of his lisinopril.  Had back surgery last fall.  This is associated with a 25 pound weight gain.  Significant diuresis over the last week and weights 268 range with scant edema.  Still however with profound fatigue.  Tired tomorrow when he walks 10-20 minutes today.  Catheterization 2/14 had demonstrated mild nonobstructive disease. DATE TEST    10/16    echo   EF 55-60 %   3/18    Echo   EF 55-60 %         Date Cr K Hgb  2/18 1.36  14.7  1/19  0.38 4.5 13.3  3/19   12.4  12/19 2.29 4.4 10.7  5/20 2.0 4.3 13.2   Surprisingly good  The patient denies symptoms of fevers, chills, cough, or new SOB worrisome for COVID 19   Past Medical History:  Diagnosis Date   Atrial fibrillation ([place])    Carpal tunnel syndrome on both sides    CHF (congestive heart failure) ([place])    CKD (chronic kidney disease) 10/2018   recent diagnosis   Complete heart block ([place])    a. s/p St. Jude PPM 11/2014.   Complication of anesthesia    stayed overnight  dt under too long d/t taking Parkinson's medication (2013?)   Coronary artery disease    GERD (gastroesophageal reflux disease)    "not as bad as it used to be"   Heart murmur    History of recent blood transfusion 07/2018   Hyperlipidemia    Hypertension    Parkinson's disease ([place])    Presence of permanent cardiac pacemaker    St Jude   Shortness of breath dyspnea     Past Surgical History:  Procedure Laterality Date   APPENDECTOMY  12/2010   BACK SURGERY  08/06/2018   lower lumbarsacral region, has rods in place. (done at [facility])   [place]  11/20/2012   Known CAD with one vessel coronary disease of left descending artery by cardiac cath.    CARDIOVERSION N/A 11/21/2016   Procedure: CARDIOVERSION;  Surgeon: Deboraha Sprang, MD;  Location: [facility] ORS;  Service: Cardiovascular;  Laterality: N/A;   CARPAL TUNNEL RELEASE Right 10/22/2018   Procedure: CARPAL TUNNEL RELEASE;  Surgeon: Dereck Leep, MD;  Location: [facility] ORS;  Service: Orthopedics;  Laterality: Right;   COLONOSCOPY  2018   INSERT / REPLACE / REMOVE PACEMAKER     PACEMAKER INSERTION  11-21-14   STJ Assurity dual chamber pacemaker implanted by Dr Rayann Heman for CHB   PERMANENT PACEMAKER INSERTION  N/A 11/21/2014   Procedure: PERMANENT PACEMAKER INSERTION;  Surgeon: Thompson Grayer, MD;  Location: [facility] CATH LAB;  Service: Cardiovascular;  Laterality: N/A;   TEE WITHOUT CARDIOVERSION N/A 11/21/2016   Procedure: TRANSESOPHAGEAL ECHOCARDIOGRAM (TEE);  Surgeon: Deboraha Sprang, MD;  Location: [facility] ORS;  Service: Cardiovascular;  Laterality: N/A;   UPPER GI ENDOSCOPY      Current Outpatient Medications  Medication Sig Dispense Refill   acetaminophen (TYLENOL) 500 MG tablet Take 500-1,000 mg by mouth every 6 (six) hours as needed (back pain.).      allopurinol (ZYLOPRIM) 100 MG tablet Take 100 mg by mouth daily.     allopurinol (ZYLOPRIM) 300 MG tablet Take 300 mg by mouth daily.  3   bisacodyl  (DULCOLAX) 5 MG EC tablet Take 5 mg by mouth daily as needed for moderate constipation.     carboxymethylcellulose (REFRESH PLUS) 0.5 % SOLN Place 1 drop into both eyes daily as needed (dry eyes).      carvedilol (COREG) 12.5 MG tablet Take 0.5 tablets (6.25 mg total) by mouth 2 (two) times daily.  3   desloratadine (CLARINEX) 5 MG tablet Take 5 mg by mouth daily.     dicyclomine (BENTYL) 20 MG tablet Take 20 mg by mouth 3 (three) times daily with meals.      ELIQUIS 5 MG TABS tablet TAKE 1 TABLET BY MOUTH TWICE A DAY 180 tablet 0   fluticasone (FLONASE) 50 MCG/ACT nasal spray Place 2 sprays into the nose daily as needed for allergies.      lansoprazole (PREVACID) 15 MG capsule Take 15 mg by mouth daily as needed (indigestion).      magnesium oxide (MAG-OX) 400 MG tablet Take 400 mg by mouth at bedtime.     meloxicam (MOBIC) 15 MG tablet Take 15 mg by mouth every other day.      metolazone (ZAROXOLYN) 2.5 MG tablet TAKE 1 TABLET BY MOUTH DAILY AS NEEDED (ONCE DAILY AS NEEDED FOR WEIGHT GAIN OR SWELLING.). 90 tablet 1   Multiple Vitamins-Minerals (OCUVITE ADULT 50+ PO) Take 1 tablet by mouth daily.     polyethylene glycol (MIRALAX / GLYCOLAX) packet Take 17 g by mouth daily as needed (constipation.).      potassium chloride (K-DUR) 10 MEQ tablet Take 30 mEq by mouth 2 (two) times daily.     pramipexole (MIRAPEX) 0.5 MG tablet Take 0.5 mg by mouth 3 (three) times daily.      selegiline (ELDEPRYL) 5 MG capsule Take 5 mg by mouth 2 (two) times daily with a meal.      torsemide (DEMADEX) 20 MG tablet Take 60 mg by mouth 2 (two) times daily.     Red Yeast Rice 600 MG CAPS Take 600 mg by mouth daily.     No current facility-administered medications for this visit.     Allergies:   Linzess [linaclotide]; Codeine; Terazosin; Amitiza [lubiprostone]; Gabapentin; Norco [hydrocodone-acetaminophen]; Omeprazole; and Requip [ropinirole]   Social History:  The patient  reports that he has  never smoked. He has never used smokeless tobacco. He reports that he does not drink alcohol or use drugs.   Family History:  The patient's   family history includes Colon cancer in his father; Heart disease in his father and mother; Stroke in his sister.   ROS:  Please see the history of present illness.   All other systems are personally reviewed and negative.    Exam:    Vital Signs:  BP (!) 160/76  Pulse 68    Ht 6\' 1"  (1.854 m)    Wt 269 lb (122 kg)    BMI 35.49 kg/m     Well appearing, alert and conversant, regular work of breathing,  good skin color Eyes- anicteric, neuro- grossly intact, skin- no apparent rash or lesions or cyanosis, mouth- oral mucosa is pink   Labs/Other Tests and Data Reviewed:    Recent Labs: 10/08/2018: BUN 69; Creatinine, Ser 2.29; Hemoglobin 10.7; Platelets 284; Potassium 4.4; Sodium 141   Wt Readings from Last 3 Encounters:  03/01/19 269 lb (122 kg)  02/15/19 272 lb (123.4 kg)  10/22/18 285 lb 15 oz (129.7 kg)     Other studies personally reviewed: Additional studies/ records that were reviewed today include: As above   Last device remote is reviewed from [place] PDF dated4/20 which reveals normal device function,   arrhythmias - presistent atrial fib **   ASSESSMENT & PLAN:    Complete heart block  Pacemaker-St. Jude    Parkinson's disease  Hypertension  Renal insufficiency grade 3  Hypertensive heart and kidney disease   HFpEF  Atrial fibrillation-persistent long term  Persistent atrial fibrillation we will be contributing to his heart failure.  We will need to undertake cardioversion.  His echocardiogram was notable for significant hypertensive heart disease with septal wall thickness of 17 mm.  We will plan to repeat his echocardiogram with contrast to accurately assess LVH; in the event that he has this degree of hypertrophy will use amiodarone as an antiarrhythmic drug.  Otherwise in the absence of coronary disease  would use a 1C  He is anticoagulated.  We will anticipate postponing his carpal tunnel surgery so as to be able to accomplish restoration of sinus rhythm.  Reviewing the burden of atrial fibrillation, there is intermittent atrial fibrillation so I think antiarrhythmic drug therapy is indicated in the short-term for the maintenance of sinus rhythm.  Reassessment of his left atrial size will also inform recommendations regarding catheter ablation in this relatively young man.  For now, we will continue him on his diuretics.  We will leave him off of his lisinopril as his blood pressure is 140s-160s.  If it remains elevated, will have to resume a low-dose of lisinopril avoiding the higher doses which was associated with profound hypotension last week.  He is significantly deconditioned.  I recommend that he begin walking 3 times a day for 5 minutes and next week increase it to 6 minutes etc. etc.  COVID 19 screen The patient denies symptoms of COVID 19 at this time.  The importance of social distancing was discussed today.  Follow-up:  2 m Next remote: As Scheduled   Current medicines are reviewed at length with the patient today.   The patient does not have concerns regarding his medicines.  The following changes were made today:   Continue off lisinopril  Continue diuretic torsemide 60 bid  Labs/ tests ordered today include: echo SOON [place]  No orders of the defined types were placed in this encounter.   Future tests ( post COVID )   s  Patient Risk:  after full review of this patients clinical status, I feel that they are at moderate risk at this time.  Today, I have spent 21 minutes with the patient with telehealth technology discussing the above.  Signed, Virl Axe, MD  03/01/2019 11:14 AM     [facility] [address] [place] [address] [phone] (office) [phone] (fax)

## 2019-03-01 NOTE — Addendum Note (Signed)
Addended by: Dollene Primrose on: 03/01/2019 01:38 PM   Modules accepted: Orders

## 2019-03-05 ENCOUNTER — Other Ambulatory Visit: Payer: Medicare Other

## 2019-03-05 ENCOUNTER — Other Ambulatory Visit (HOSPITAL_COMMUNITY): Payer: Medicare Other

## 2019-03-05 ENCOUNTER — Other Ambulatory Visit
Admission: RE | Admit: 2019-03-05 | Discharge: 2019-03-05 | Disposition: A | Discharge status: Home / Self Care | Payer: Medicare Other | Source: Ambulatory Visit | Attending: Internal Medicine | Admitting: Internal Medicine

## 2019-03-05 ENCOUNTER — Other Ambulatory Visit: Payer: Self-pay

## 2019-03-05 ENCOUNTER — Inpatient Hospital Stay: Admission: RE | Admit: 2019-03-05 | Payer: Medicare Other | Source: Ambulatory Visit

## 2019-03-05 ENCOUNTER — Inpatient Hospital Stay: Admit: 2019-03-05 | Payer: Medicare Other

## 2019-03-05 DIAGNOSIS — I10 Essential (primary) hypertension: Secondary | ICD-10-CM

## 2019-03-05 DIAGNOSIS — Z950 Presence of cardiac pacemaker: Secondary | ICD-10-CM

## 2019-03-05 DIAGNOSIS — Z1159 Encounter for screening for other viral diseases: Secondary | ICD-10-CM | POA: Diagnosis not present

## 2019-03-05 DIAGNOSIS — I110 Hypertensive heart disease with heart failure: Secondary | ICD-10-CM | POA: Insufficient documentation

## 2019-03-05 DIAGNOSIS — I4819 Other persistent atrial fibrillation: Secondary | ICD-10-CM

## 2019-03-05 DIAGNOSIS — I959 Hypotension, unspecified: Secondary | ICD-10-CM

## 2019-03-05 DIAGNOSIS — I442 Atrioventricular block, complete: Secondary | ICD-10-CM | POA: Insufficient documentation

## 2019-03-05 DIAGNOSIS — I5032 Chronic diastolic (congestive) heart failure: Secondary | ICD-10-CM | POA: Diagnosis not present

## 2019-03-05 LAB — CBC
HCT: 40 % (ref 39.0–52.0)
Hemoglobin: 13 g/dL (ref 13.0–17.0)
MCH: 29.6 pg (ref 26.0–34.0)
MCHC: 32.5 g/dL (ref 30.0–36.0)
MCV: 91.1 fL (ref 80.0–100.0)
Platelets: 253 10*3/uL (ref 150–400)
RBC: 4.39 MIL/uL (ref 4.22–5.81)
RDW: 15.9 % — ABNORMAL HIGH (ref 11.5–15.5)
WBC: 9 10*3/uL (ref 4.0–10.5)
nRBC: 0 % (ref 0.0–0.2)

## 2019-03-05 LAB — BASIC METABOLIC PANEL
Anion gap: 12 (ref 5–15)
BUN: 70 mg/dL — ABNORMAL HIGH (ref 8–23)
CO2: 26 mmol/L (ref 22–32)
Calcium: 8.9 mg/dL (ref 8.9–10.3)
Chloride: 100 mmol/L (ref 98–111)
Creatinine, Ser: 2 mg/dL — ABNORMAL HIGH (ref 0.61–1.24)
GFR calc Af Amer: 38 mL/min — ABNORMAL LOW (ref >60)
GFR calc non Af Amer: 33 mL/min — ABNORMAL LOW (ref >60)
Glucose, Bld: 142 mg/dL — ABNORMAL HIGH (ref 70–99)
Potassium: 4.1 mmol/L (ref 3.5–5.1)
Sodium: 138 mmol/L (ref 135–145)

## 2019-03-06 ENCOUNTER — Ambulatory Visit: Admit: 2019-03-06 | Payer: Medicare Other | Admitting: Orthopedic Surgery

## 2019-03-06 LAB — NOVEL CORONAVIRUS, NAA (HOSP ORDER, SEND-OUT TO REF LAB; TAT 18-24 HRS): SARS-CoV-2, NAA: NOT DETECTED

## 2019-03-06 SURGERY — CARPAL TUNNEL RELEASE
Anesthesia: Choice | Laterality: Left

## 2019-03-07 ENCOUNTER — Inpatient Hospital Stay: Admission: RE | Admit: 2019-03-07 | Payer: Medicare Other | Source: Ambulatory Visit

## 2019-03-08 ENCOUNTER — Ambulatory Visit (HOSPITAL_COMMUNITY)
Admission: RE | Admit: 2019-03-08 | Discharge: 2019-03-08 | Disposition: A | Discharge status: Home / Self Care | Payer: Medicare Other | Attending: Internal Medicine | Admitting: Internal Medicine

## 2019-03-08 ENCOUNTER — Encounter (HOSPITAL_COMMUNITY): Payer: Self-pay

## 2019-03-08 ENCOUNTER — Ambulatory Visit (HOSPITAL_COMMUNITY): Payer: Medicare Other | Admitting: Certified Registered"

## 2019-03-08 ENCOUNTER — Other Ambulatory Visit: Payer: Self-pay

## 2019-03-08 ENCOUNTER — Encounter (HOSPITAL_COMMUNITY)
Admission: RE | Disposition: A | Discharge status: Home / Self Care | Payer: Self-pay | Source: Home / Self Care | Attending: Internal Medicine

## 2019-03-08 DIAGNOSIS — E785 Hyperlipidemia, unspecified: Secondary | ICD-10-CM | POA: Insufficient documentation

## 2019-03-08 DIAGNOSIS — I4819 Other persistent atrial fibrillation: Secondary | ICD-10-CM | POA: Diagnosis present

## 2019-03-08 DIAGNOSIS — Z950 Presence of cardiac pacemaker: Secondary | ICD-10-CM | POA: Diagnosis not present

## 2019-03-08 DIAGNOSIS — Z7951 Long term (current) use of inhaled steroids: Secondary | ICD-10-CM | POA: Insufficient documentation

## 2019-03-08 DIAGNOSIS — I4891 Unspecified atrial fibrillation: Secondary | ICD-10-CM

## 2019-03-08 DIAGNOSIS — I2510 Atherosclerotic heart disease of native coronary artery without angina pectoris: Secondary | ICD-10-CM | POA: Diagnosis not present

## 2019-03-08 DIAGNOSIS — G20 Parkinson's disease: Secondary | ICD-10-CM | POA: Insufficient documentation

## 2019-03-08 DIAGNOSIS — Z79899 Other long term (current) drug therapy: Secondary | ICD-10-CM | POA: Diagnosis not present

## 2019-03-08 DIAGNOSIS — I442 Atrioventricular block, complete: Secondary | ICD-10-CM | POA: Diagnosis not present

## 2019-03-08 DIAGNOSIS — Z885 Allergy status to narcotic agent status: Secondary | ICD-10-CM | POA: Diagnosis not present

## 2019-03-08 DIAGNOSIS — Z7901 Long term (current) use of anticoagulants: Secondary | ICD-10-CM | POA: Insufficient documentation

## 2019-03-08 DIAGNOSIS — N189 Chronic kidney disease, unspecified: Secondary | ICD-10-CM | POA: Diagnosis not present

## 2019-03-08 DIAGNOSIS — I130 Hypertensive heart and chronic kidney disease with heart failure and stage 1 through stage 4 chronic kidney disease, or unspecified chronic kidney disease: Secondary | ICD-10-CM | POA: Diagnosis present

## 2019-03-08 DIAGNOSIS — Z791 Long term (current) use of non-steroidal anti-inflammatories (NSAID): Secondary | ICD-10-CM | POA: Diagnosis not present

## 2019-03-08 DIAGNOSIS — Z888 Allergy status to other drugs, medicaments and biological substances status: Secondary | ICD-10-CM | POA: Insufficient documentation

## 2019-03-08 DIAGNOSIS — K219 Gastro-esophageal reflux disease without esophagitis: Secondary | ICD-10-CM | POA: Diagnosis not present

## 2019-03-08 DIAGNOSIS — Z8249 Family history of ischemic heart disease and other diseases of the circulatory system: Secondary | ICD-10-CM | POA: Insufficient documentation

## 2019-03-08 HISTORY — PX: CARDIOVERSION: SHX1299

## 2019-03-08 SURGERY — CARDIOVERSION
Anesthesia: General

## 2019-03-08 MED ORDER — SODIUM CHLORIDE 0.9 % IV SOLN
INTRAVENOUS | Status: DC
  Administered 2019-03-08: 07:00:00 via INTRAVENOUS

## 2019-03-08 MED ORDER — PROPOFOL 10 MG/ML IV BOLUS
INTRAVENOUS | Status: DC | PRN
  Administered 2019-03-08: 80 mg via INTRAVENOUS

## 2019-03-08 MED ORDER — LIDOCAINE 2% (20 MG/ML) 5 ML SYRINGE
INTRAMUSCULAR | Status: DC | PRN
  Administered 2019-03-08: 100 mg via INTRAVENOUS

## 2019-03-08 NOTE — Anesthesia Postprocedure Evaluation (Signed)
Anesthesia Post Note  Patient: Matthew Howe  Procedure(s) Performed: CARDIOVERSION (N/A )     Patient location during evaluation: Endoscopy Anesthesia Type: General Level of consciousness: awake and alert Pain management: pain level controlled Vital Signs Assessment: post-procedure vital signs reviewed and stable Respiratory status: spontaneous breathing, nonlabored ventilation and respiratory function stable Cardiovascular status: blood pressure returned to baseline and stable Postop Assessment: no apparent nausea or vomiting Anesthetic complications: no    Last Vitals:  Vitals:   03/08/19 0755 03/08/19 0805  BP: 116/62 124/66  Pulse: 70 70  Resp: 10 20  Temp:    SpO2: 98% 98%    Last Pain:  Vitals:   03/08/19 0745  TempSrc: Oral  PainSc: 0-No pain                 Lynda Rainwater

## 2019-03-08 NOTE — Anesthesia Preprocedure Evaluation (Signed)
Anesthesia Evaluation  Patient identified by MRN, date of birth, ID band Patient awake    Reviewed: Allergy & Precautions, NPO status , Patient's Chart, lab work & pertinent test results, reviewed documented beta blocker date and time   History of Anesthesia Complications (+) PROLONGED EMERGENCE and history of anesthetic complications  Airway Mallampati: II  TM Distance: >3 FB Neck ROM: Full    Dental  (+) Dental Advisory Given   Pulmonary shortness of breath and with exertion,    breath sounds clear to auscultation       Cardiovascular hypertension, Pt. on medications and Pt. on home beta blockers + CAD and +CHF  + dysrhythmias Atrial Fibrillation + pacemaker + Cardiac Defibrillator + Valvular Problems/Murmurs  Rhythm:Regular Rate:Normal  EKG: 05/29/2018: AV Dual paced rhythm  CV: TTE 12/08/2016: Study Conclusions - Left ventricle: The cavity size was normal. Wall thickness wasnormal. Systolic function was normal. The estimated ejectionfraction was in the range of 55% to 60%. The study is nottechnically sufficient to allow evaluation of LV diastolicfunction. - Aortic valve: There was mild regurgitation per color doppler.Estimated PHT 538ms. - Aortic root: The aortic root was mildly dilated, 3.8cm. - Ascending aorta: The ascending aorta was mildly dilated, 3.9cm. - Mitral valve: There was mild regurgitation. - Left atrium: The atrium was mildly to moderately dilated.   Neuro/Psych Parkinsons  Neuromuscular disease negative psych ROS   GI/Hepatic Neg liver ROS, GERD  ,  Endo/Other  negative endocrine ROS  Renal/GU CRFRenal disease  negative genitourinary   Musculoskeletal  (+) Arthritis ,   Abdominal   Peds negative pediatric ROS (+)  Hematology negative hematology ROS (+)   Anesthesia Other Findings Past Medical History: No date: Atrial fibrillation (HCC) No date: CHF (congestive heart failure)  ([place]) 10/2018: CKD (chronic kidney disease)     Comment:  recent diagnosis No date: Complete heart block ([place])     Comment:  a. s/p St. Jude PPM 11/2014. No date: Complication of anesthesia     Comment:  stayed overnight dt under too long d/t taking               Parkinson's medication (2013?) No date: Coronary artery disease No date: GERD (gastroesophageal reflux disease)     Comment:  "not as bad as it used to be" No date: Heart murmur No date: Hyperlipidemia No date: Hypertension No date: Parkinson's disease (HCC) No date: Presence of permanent cardiac pacemaker     Comment:  St Jude No date: Shortness of breath dyspnea  Reproductive/Obstetrics                             Lab Results  Component Value Date   WBC 9.0 03/05/2019   HGB 13.0 03/05/2019   HCT 40.0 03/05/2019   MCV 91.1 03/05/2019   PLT 253 03/05/2019   Lab Results  Component Value Date   CREATININE 2.00 (H) 03/05/2019   BUN 70 (H) 03/05/2019   NA 138 03/05/2019   K 4.1 03/05/2019   CL 100 03/05/2019   CO2 26 03/05/2019    Anesthesia Physical  Anesthesia Plan  ASA: III  Anesthesia Plan: General   Post-op Pain Management:    Induction: Intravenous  PONV Risk Score and Plan: 2 and TIVA, Treatment may vary due to age or medical condition and Propofol infusion  Airway Management Planned: Mask  Additional Equipment:   Intra-op Plan:   Post-operative Plan:   Informed Consent: I have  reviewed the patients History and Physical, chart, labs and discussed the procedure including the risks, benefits and alternatives for the proposed anesthesia with the patient or authorized representative who has indicated his/her understanding and acceptance.     Dental advisory given  Plan Discussed with: CRNA and Surgeon  Anesthesia Plan Comments:         Anesthesia Quick Evaluation

## 2019-03-08 NOTE — Discharge Instructions (Signed)
Electrical Cardioversion, Care After °This sheet gives you information about how to care for yourself after your procedure. Your health care provider may also give you more specific instructions. If you have problems or questions, contact your health care provider. °What can I expect after the procedure? °After the procedure, it is common to have: °· Some redness on the skin where the shocks were given. °Follow these instructions at home: ° °· Do not drive for 24 hours if you were given a medicine to help you relax (sedative). °· Take over-the-counter and prescription medicines only as told by your health care provider. °· Ask your health care provider how to check your pulse. Check it often. °· Rest for 48 hours after the procedure or as told by your health care provider. °· Avoid or limit your caffeine use as told by your health care provider. °Contact a health care provider if: °· You feel like your heart is beating too quickly or your pulse is not regular. °· You have a serious muscle cramp that does not go away. °Get help right away if: ° °· You have discomfort in your chest. °· You are dizzy or you feel faint. °· You have trouble breathing or you are short of breath. °· Your speech is slurred. °· You have trouble moving an arm or leg on one side of your body. °· Your fingers or toes turn cold or blue. °This information is not intended to replace advice given to you by your health care provider. Make sure you discuss any questions you have with your health care provider. °Document Released: 07/17/2013 Document Revised: 04/29/2016 Document Reviewed: 04/01/2016 °Elsevier Interactive Patient Education © 2019 Else[employer] Inc. ° °

## 2019-03-08 NOTE — Transfer of Care (Signed)
Immediate Anesthesia Transfer of Care Note  Patient: Matthew Howe  Procedure(s) Performed: CARDIOVERSION (N/A )  Patient Location: Endoscopy Unit  Anesthesia Type:General  Level of Consciousness: drowsy and patient cooperative  Airway & Oxygen Therapy: Patient Spontanous Breathing and Patient connected to nasal cannula oxygen  Post-op Assessment: Report given to RN, Post -op Vital signs reviewed and stable and Patient moving all extremities  Post vital signs: Reviewed and stable  Last Vitals:  Vitals Value Taken Time  BP    Temp    Pulse    Resp    SpO2      Last Pain:  Vitals:   03/08/19 0657  TempSrc: Oral  PainSc: 0-No pain         Complications: No apparent anesthesia complications

## 2019-03-08 NOTE — CV Procedure (Signed)
Cardioversion  Patient sedated by anesthesia with Propofol and lidocaine  WIth pads in apex/base positions, pt cardioverted to SR with 200 J synchronized biphasic energy.  Procedure without complications  Device interrogated

## 2019-03-08 NOTE — Interval H&P Note (Signed)
History and Physical Interval Note:  03/08/2019 7:34 AM  Matthew Howe  has presented today for surgery, with the diagnosis of AFIB.  The various methods of treatment have been discussed with the patient and family. After consideration of risks, benefits and other options for treatment, the patient has consented to  Procedure(s): CARDIOVERSION (N/A) as a surgical intervention.  The patient's history has been reviewed, patient examined, no change in status, stable for surgery.  I have reviewed the patient's chart and labs.  Questions were answered to the patient's satisfaction.     Dorris Carnes

## 2019-03-11 ENCOUNTER — Encounter (HOSPITAL_COMMUNITY): Payer: Self-pay | Admitting: Internal Medicine

## 2019-03-15 ENCOUNTER — Ambulatory Visit (INDEPENDENT_AMBULATORY_CARE_PROVIDER_SITE_OTHER): Payer: Medicare Other

## 2019-03-15 ENCOUNTER — Other Ambulatory Visit: Payer: Self-pay

## 2019-03-15 DIAGNOSIS — I442 Atrioventricular block, complete: Secondary | ICD-10-CM | POA: Diagnosis not present

## 2019-03-15 DIAGNOSIS — Z950 Presence of cardiac pacemaker: Secondary | ICD-10-CM

## 2019-03-15 DIAGNOSIS — I5032 Chronic diastolic (congestive) heart failure: Secondary | ICD-10-CM

## 2019-03-15 DIAGNOSIS — I959 Hypotension, unspecified: Secondary | ICD-10-CM

## 2019-03-15 DIAGNOSIS — I10 Essential (primary) hypertension: Secondary | ICD-10-CM

## 2019-03-15 DIAGNOSIS — I4819 Other persistent atrial fibrillation: Secondary | ICD-10-CM

## 2019-03-15 MED ORDER — PERFLUTREN LIPID MICROSPHERE
1.0000 mL | INTRAVENOUS | Status: AC | PRN
  Administered 2019-03-15: 2 mL via INTRAVENOUS

## 2019-03-16 IMAGING — RF DG LUMBAR SPINE 2-3V
1 series · 2 of 2 positions shown · non-contrast
Comparison: None.

CLINICAL DATA: L4-5 posterior fusion

EXAM:
DG C-ARM 61-120 MIN; LUMBAR SPINE - 2-3 VIEW

[Series 1: run · 2 of 2 slices shown]
[im 1/2]
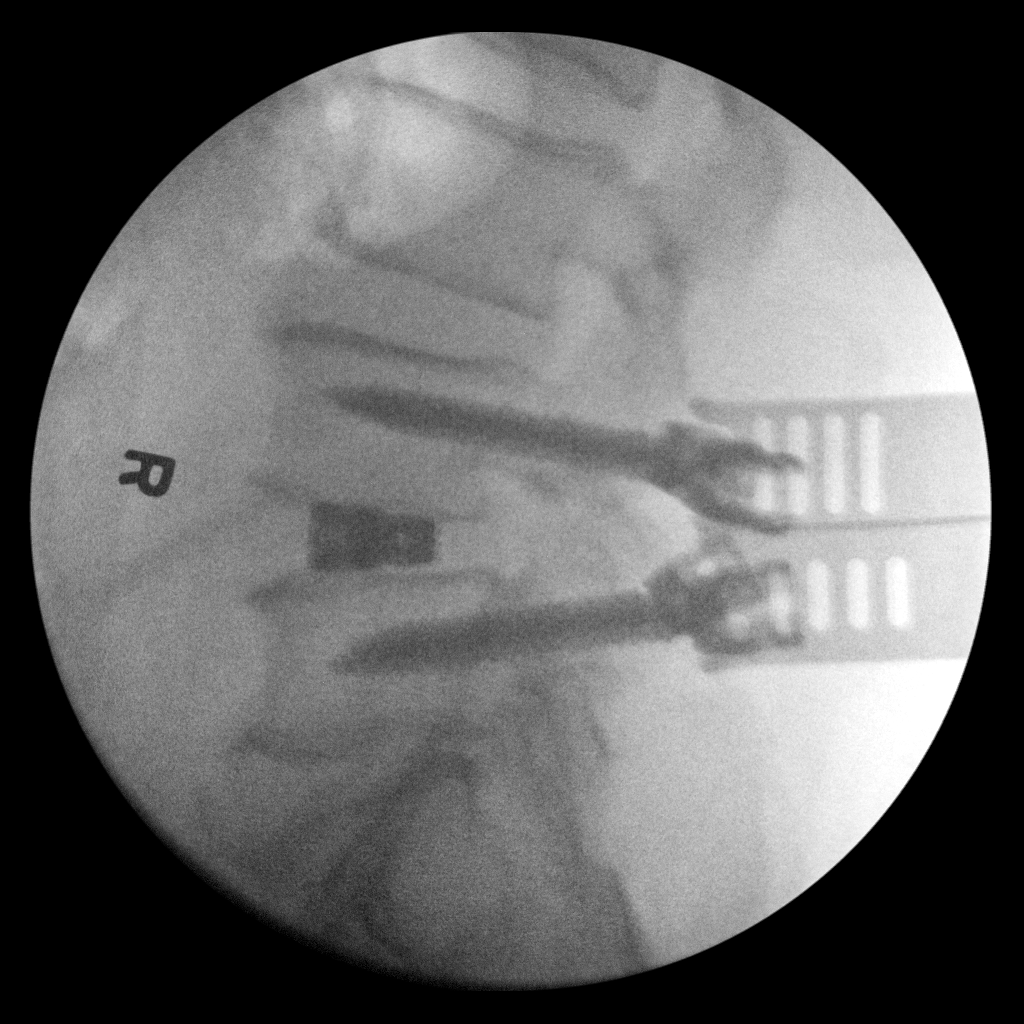
[im 2/2]
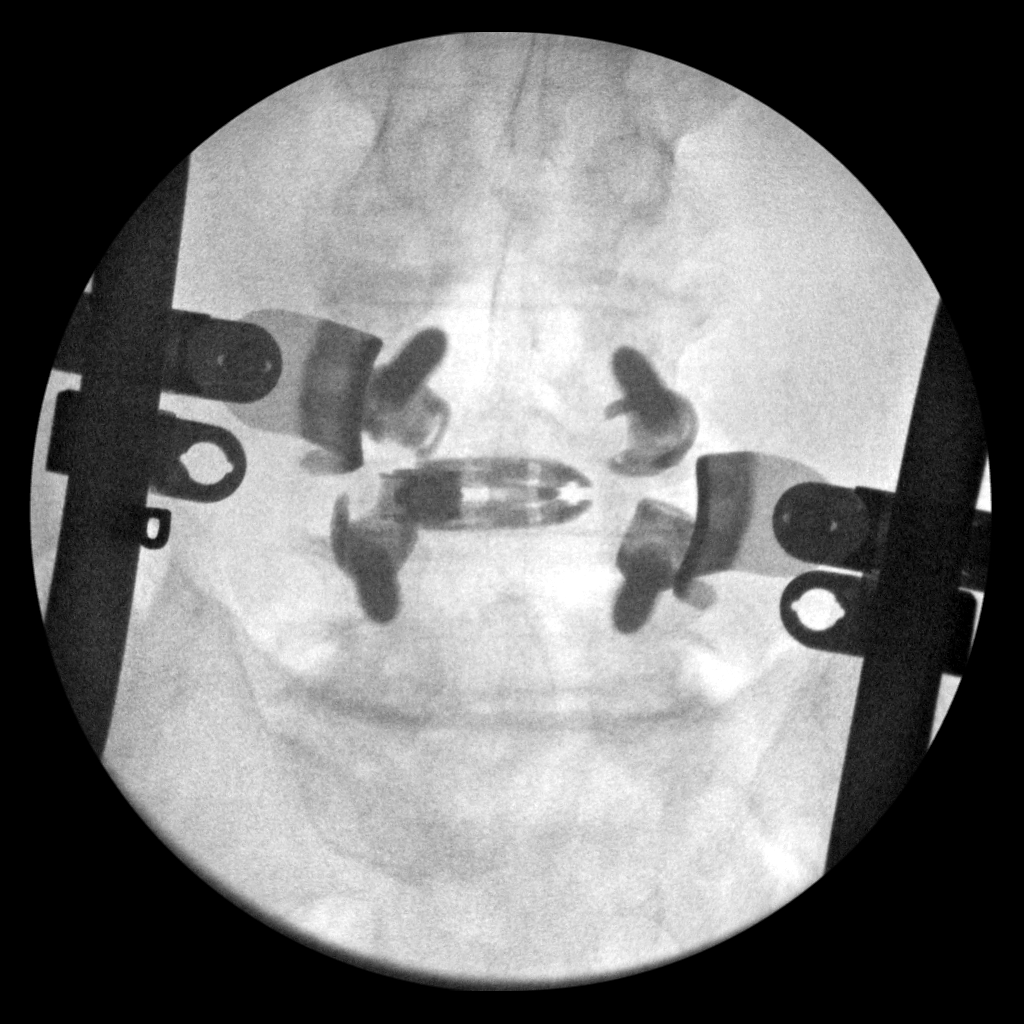

[2 of 2 positions shown; findings below may reference images not displayed]

FINDINGS: Posterior fusion changes noted at L4-5. No hardware bony
complicating feature. Normal alignment.
IMPRESSION: Posterior fusion L4-5 without visible complicating feature.

## 2019-03-16 IMAGING — CR DG LUMBAR SPINE 2-3V
1 series · 1 of 1 positions shown · non-contrast
Comparison: 07/10/2018

CLINICAL DATA: Posterior fusion L4-5

EXAM:
LUMBAR SPINE - 2-3 VIEW

[lateral]
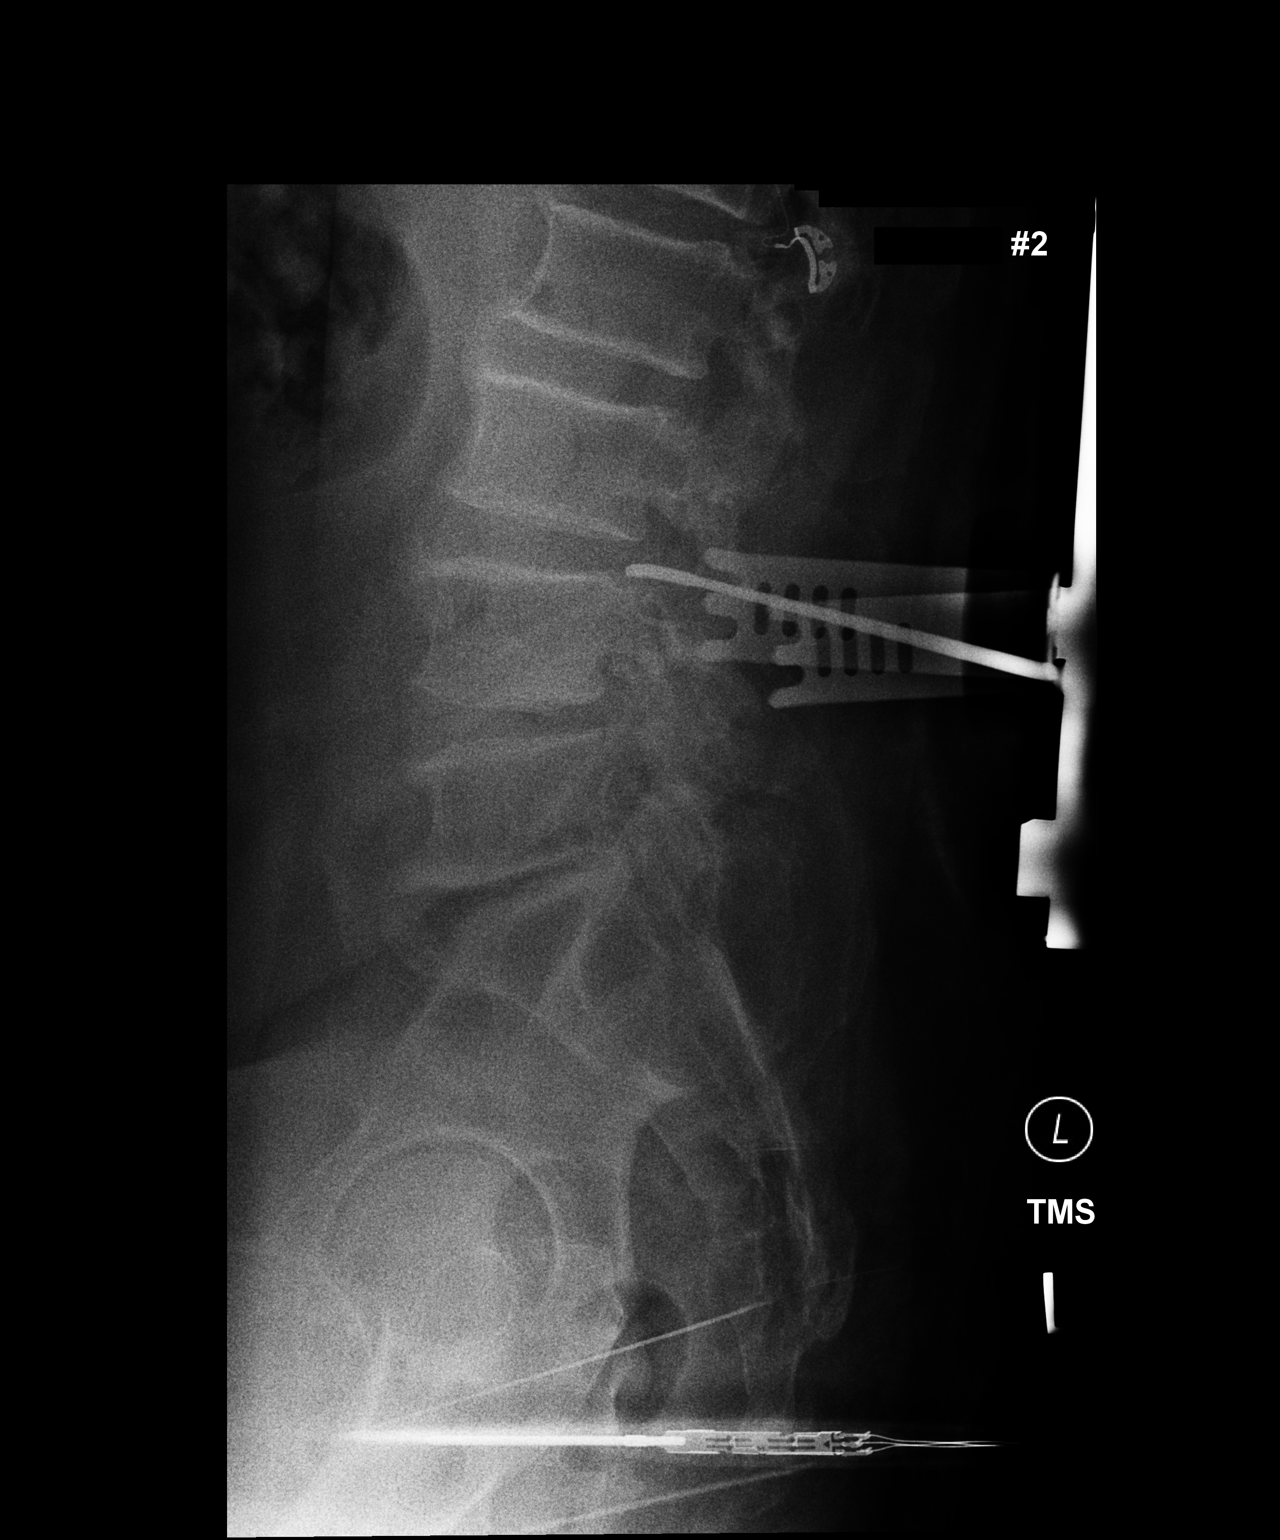

[1 of 1 positions shown; findings below may reference images not displayed]

FINDINGS: Posterior cervical instrument is directed at the L4 vertebral body,
angled inferiorly toward L4-5 disc space.
IMPRESSION: Intraoperative localization as above.

## 2019-04-03 ENCOUNTER — Telehealth: Payer: Self-pay | Admitting: Internal Medicine

## 2019-04-03 ENCOUNTER — Telehealth: Payer: Self-pay

## 2019-04-03 MED ORDER — FLECAINIDE ACETATE 100 MG PO TABS: 100.0000 mg | ORAL_TABLET | Freq: Two times a day (BID) | ORAL | 6 refills | Status: DC

## 2019-04-03 NOTE — Telephone Encounter (Signed)
Called to ask COVID screening questions to patient and patients wife.  No answer. No VM.

## 2019-04-03 NOTE — Telephone Encounter (Signed)
I spoke with the patient regarding his echo results and Dr. Olin Pia recommendations to: 1) start flecainide 100 mg BID 2) have a DCCV  The patient is agreeable with starting flecainide 100 mg BID. He is already scheduled for follow up on 04/08/19 with Dr. Rockey Situ.  I advised him that we can reassess his EKG at that time and proceed with DCCV if needed.  The patient voices understanding of the above and is agreeable.

## 2019-04-03 NOTE — Telephone Encounter (Signed)
Notes recorded by Deboraha Sprang, MD on 03/25/2019 at 12:14 PM EDT  Matthew Howe  Please Inform Patient Echo showed modest but not severe LVH and normal* heart muscle function  Hence for his Afib lets begin flecainide 100 mg bid  And we will also need to set up DCCV  Thanks SK

## 2019-04-05 ENCOUNTER — Telehealth: Payer: Self-pay | Admitting: Cardiovascular Disease

## 2019-04-05 NOTE — Telephone Encounter (Signed)

## 2019-04-06 NOTE — Progress Notes (Signed)
Cardiology Office Note  Date:  04/08/2019   ID:  Kyllian, Clingerman 1946-01-28, MRN [id number]  PCP:  Baxter Hire, MD   Chief Complaint  Patient presents with  . other    Patient c/o some SOB and swelling. Meds reviewed verbally with patient.     HPI:  Mr. Genter is a very pleasant 73 year old gentleman with diagnosis of  Parkinson's who currently works delivering car parts,  Hypertension, cardiac catheterization showing mild CAD , February 2014  EF 55% hyperlipidemia,  s/p pacemaker placement.complete heart block.  Found to have atrial fibrillation 11/22/2014 on pacemaker download, started on anticoagulation   hospitalization for paroxysmal atrial fibrillation/acute on chronic diastolic CHF He presents today for weight gain, shortness of breath, leg edema, chronic diastolic CHF Paroxysmal atrial fibrillation  End of last year was having weight gain Persistent atrial fib since 08/2018 noted on pacer download Cardioversion for atrial fib 03/08/2019  Needs carpal tunnel surgery Had one side done already with no complications   off lisinopril , blood pressure was running low On  diuretic torsemide 60 bid flecainide 100 mg bid Creatinine 2.2 now down to 2.0 Feels he cannot go with low-dose diuretic as he gets bloating, leg swelling abdominal swelling  BP 332-951 systolic at home without lisinopril  Echo 03/15/2019 ejection fraction of 50-55%.  Scheduled to see GI for constipation Weight stable 270 pounds  EKG personally reviewed by myself on todays visit shows paced rhythm 70 bpm underlying normal sinus rhythm  Other past medical history  presented to the hospital 07/28/2015 with abdominal bloating, shortness of breath Was treated with diureticswith improvement of his symptoms Encouraged to take Lasix daily  leads a busy lifestyle, continues to work most days of the week. Fatigue, tremor Followed by neurology, Dr. Melrose Nakayama for Parkinson's Previous issues with  constipation  He presented to [facility] with bradycardia February 2016. This seemed to improve without intervention and he is asymptomatic. He had recurrent symptoms and we referred him to [facility] for further evaluation. He was seen in the emergency room and found to be in heart block, pacemaker placed urgently   Previous Stress echo leading to cardiac catheterization at [facility].  Stress echo was a limited study, ejection fraction estimated at 45% with EF of 50% at peak stress.  Cardiac catheterization 11/20/2012 with 25% proximal LAD disease, followed by 40% lesion. The cardiac catheterization note makes mention of atrial flutter though arrhythmia is not mentioned in any of the clinic notes.  Previous Echocardiogram shows ejection fraction 50-55%, mild to moderate aortic valve insufficiency, otherwise normal study.  Abdominal CT 12/22/2010 showed acute appendicitis, colonic diverticulosis    PMH:   has a past medical history of Atrial fibrillation ([place]), Carpal tunnel syndrome on both sides, CHF (congestive heart failure) ([place]), CKD (chronic kidney disease) (10/2018), Complete heart block ([place]), Complication of anesthesia, Coronary artery disease, GERD (gastroesophageal reflux disease), Heart murmur, History of recent blood transfusion (07/2018), Hyperlipidemia, Hypertension, Parkinson's disease ([place]), Presence of permanent cardiac pacemaker, and Shortness of breath dyspnea.  PSH:    Past Surgical History:  Procedure Laterality Date  . APPENDECTOMY  12/2010  . BACK SURGERY  08/06/2018   lower lumbarsacral region, has rods in place. (done at [facility])  . CARDIAC CATHETERIZATION  11/20/2012   Known CAD with one vessel coronary disease of left descending artery by cardiac cath.   [name] CARDIOVERSION N/A 11/21/2016   Procedure: CARDIOVERSION;  Surgeon: Deboraha Sprang, MD;  Location: [facility] ORS;  Service: Cardiovascular;  Laterality: N/A;  . CARDIOVERSION N/A 03/08/2019   Procedure:  CARDIOVERSION;  Surgeon: Fay Records, MD;  Location: [facility];  Service: Cardiovascular;  Laterality: N/A;  . CARPAL TUNNEL RELEASE Right 10/22/2018   Procedure: CARPAL TUNNEL RELEASE;  Surgeon: Dereck Leep, MD;  Location: [facility] ORS;  Service: Orthopedics;  Laterality: Right;  . COLONOSCOPY  2018  . INSERT / REPLACE / REMOVE PACEMAKER    . PACEMAKER INSERTION  11-21-14   STJ Assurity dual chamber pacemaker implanted by Dr Rayann Heman for CHB  . PERMANENT PACEMAKER INSERTION N/A 11/21/2014   Procedure: PERMANENT PACEMAKER INSERTION;  Surgeon: Thompson Grayer, MD;  Location: [facility] CATH LAB;  Service: Cardiovascular;  Laterality: N/A;  . TEE WITHOUT CARDIOVERSION N/A 11/21/2016   Procedure: TRANSESOPHAGEAL ECHOCARDIOGRAM (TEE);  Surgeon: Deboraha Sprang, MD;  Location: [facility] ORS;  Service: Cardiovascular;  Laterality: N/A;  . UPPER GI ENDOSCOPY      Current Outpatient Medications  Medication Sig Dispense Refill  . acetaminophen (TYLENOL) 500 MG tablet Take 500-1,000 mg by mouth every 6 (six) hours as needed (back pain.).     [name] allopurinol (ZYLOPRIM) 100 MG tablet Take 100 mg by mouth daily.    [name] allopurinol (ZYLOPRIM) 300 MG tablet Take 300 mg by mouth daily.  3  . bisacodyl (DULCOLAX) 5 MG EC tablet Take 5 mg by mouth daily as needed for moderate constipation.    . carboxymethylcellulose (REFRESH PLUS) 0.5 % SOLN Place 1 drop into both eyes daily as needed (dry eyes).     . carvedilol (COREG) 12.5 MG tablet Take 0.5 tablets (6.25 mg total) by mouth 2 (two) times daily.  3  . desloratadine (CLARINEX) 5 MG tablet Take 5 mg by mouth daily.    [name] dicyclomine (BENTYL) 20 MG tablet Take 20 mg by mouth 3 (three) times daily with meals.     [name] ELIQUIS 5 MG TABS tablet TAKE 1 TABLET BY MOUTH TWICE A DAY 180 tablet 0  . flecainide (TAMBOCOR) 100 MG tablet Take 1 tablet (100 mg total) by mouth 2 (two) times daily. 60 tablet 6  . fluticasone (FLONASE) 50 MCG/ACT nasal spray Place 2 sprays into the nose daily as  needed for allergies.     [name] lansoprazole (PREVACID) 15 MG capsule Take 15 mg by mouth daily as needed (indigestion).     . magnesium oxide (MAG-OX) 400 MG tablet Take 400 mg by mouth at bedtime.    . meloxicam (MOBIC) 15 MG tablet Take 15 mg by mouth every other day.     . metolazone (ZAROXOLYN) 2.5 MG tablet TAKE 1 TABLET BY MOUTH DAILY AS NEEDED (ONCE DAILY AS NEEDED FOR WEIGHT GAIN OR SWELLING.). 90 tablet 1  . Multiple Vitamins-Minerals (OCUVITE ADULT 50+ PO) Take 1 tablet by mouth daily.    . polyethylene glycol (MIRALAX / GLYCOLAX) packet Take 17 g by mouth daily as needed (constipation.).     [name] potassium chloride (K-DUR) 10 MEQ tablet Take 30 mEq by mouth 2 (two) times daily.    . pramipexole (MIRAPEX) 0.5 MG tablet Take 0.5 mg by mouth 3 (three) times daily.     . Red Yeast Rice 600 MG CAPS Take 600 mg by mouth daily.    . selegiline (ELDEPRYL) 5 MG capsule Take 5 mg by mouth 2 (two) times daily with a meal.     . torsemide (DEMADEX) 20 MG tablet Take 60 mg by mouth 2 (two) times daily.     No current facility-administered medications  for this visit.      Allergies:   Linzess [linaclotide], Codeine, Terazosin, Amitiza [lubiprostone], Gabapentin, Norco [hydrocodone-acetaminophen], Omeprazole, and Requip [ropinirole]   Social History:  The patient  reports that he has never smoked. He has never used smokeless tobacco. He reports that he does not drink alcohol or use drugs.   Family History:   family history includes Colon cancer in his father; Heart disease in his father and mother; Stroke in his sister.    Review of Systems: Review of Systems  Constitutional: Negative.   Respiratory: Negative.   Cardiovascular: Negative.   Gastrointestinal: Negative.   Musculoskeletal: Positive for joint pain.       Swelling of his joints in the hands wrist  Neurological: Negative.   Psychiatric/Behavioral: Negative.   All other systems reviewed and are negative.    PHYSICAL EXAM: VS:   BP 138/70 (BP Location: Left Arm, Patient Position: Sitting, Cuff Size: Normal)   Ht 6\' 1"  (1.854 m)   Wt 279 lb (126.6 kg)   BMI 36.81 kg/m  , BMI Body mass index is 36.81 kg/m. Constitutional:  oriented to person, place, and time. No distress.  HENT:  Head: Grossly normal Eyes:  no discharge. No scleral icterus.  Neck: No JVD, no carotid bruits  Cardiovascular: Regular rate and rhythm, no murmurs appreciated Pulmonary/Chest: Clear to auscultation bilaterally, no wheezes or rails Abdominal: Soft.  no distension.  no tenderness.  Musculoskeletal: Normal range of motion Neurological:  normal muscle tone. Coordination normal. No atrophy Skin: Skin warm and dry Psychiatric: normal affect, pleasant   Recent Labs: 03/05/2019: BUN 70; Creatinine, Ser 2.00; Hemoglobin 13.0; Platelets 253; Potassium 4.1; Sodium 138    Lipid Panel Lab Results  Component Value Date   CHOL 188 03/23/2016   HDL 46 03/23/2016   LDLCALC 114 (H) 03/23/2016   TRIG 138 03/23/2016      Wt Readings from Last 3 Encounters:  04/08/19 279 lb (126.6 kg)  03/08/19 270 lb (122.5 kg)  03/01/19 269 lb (122 kg)       ASSESSMENT AND PLAN:  Essential hypertension -  Blood pressure is well controlled on today's visit. No changes made to the medications.  Paroxysmal atrial fibrillation [facility]) - Successful cardioversion May 2020 Maintaining normal sinus rhythm on flecainide 100 twice daily  Coronary artery disease involving native coronary artery of native heart without angina pectoris Currently with no symptoms of angina. No further workup at this time. Continue current medication regimen.  Complete heart block ([facility]) Pacemaker in place, followed by Dr. Caryl Comes  Mixed hyperlipidemia Recommended weight loss  Constipation Likely related to his Parkinson's disease and dehydration, Discussed various regimens, he has follow-up with GI  Chronic diastolic heart failure ([place]) Weight is stable on torsemide 60  twice a day  Not followed by nephrology Creatinine 2.0 Does not appear grossly fluid overloaded on today's visit  Parkinson's disease ([place]) Stable Followed by  Dr. Melrose Nakayama  Morbid obesity ([place]) Weight continues to run high to 70  Shortness of breath Weight gain, deconditioning   Total encounter time more than 25 minutes  Greater than 50% was spent in counseling and coordination of care with the patient   Disposition:   F/U  12 months   Orders Placed This Encounter  Procedures  . EKG 12-Lead     Signed, Esmond Plants, M.D., Ph.D. 04/08/2019  [place], [place]

## 2019-04-08 ENCOUNTER — Ambulatory Visit (INDEPENDENT_AMBULATORY_CARE_PROVIDER_SITE_OTHER): Payer: Medicare Other | Admitting: Cardiovascular Disease

## 2019-04-08 ENCOUNTER — Other Ambulatory Visit: Payer: Self-pay

## 2019-04-08 VITALS — BP 138/70 | Ht 73.0 in | Wt 279.0 lb

## 2019-04-08 DIAGNOSIS — I5032 Chronic diastolic (congestive) heart failure: Secondary | ICD-10-CM | POA: Diagnosis not present

## 2019-04-08 DIAGNOSIS — I442 Atrioventricular block, complete: Secondary | ICD-10-CM | POA: Diagnosis not present

## 2019-04-08 DIAGNOSIS — I480 Paroxysmal atrial fibrillation: Secondary | ICD-10-CM | POA: Diagnosis not present

## 2019-04-08 DIAGNOSIS — G20 Parkinson's disease: Secondary | ICD-10-CM

## 2019-04-08 DIAGNOSIS — G20A1 Parkinson's disease without dyskinesia, without mention of fluctuations: Secondary | ICD-10-CM

## 2019-04-08 NOTE — Patient Instructions (Signed)

## 2019-04-11 ENCOUNTER — Telehealth: Payer: Medicare Other | Admitting: Internal Medicine

## 2019-04-15 ENCOUNTER — Telehealth: Payer: Self-pay | Admitting: Cardiovascular Disease

## 2019-04-15 DIAGNOSIS — I480 Paroxysmal atrial fibrillation: Secondary | ICD-10-CM

## 2019-04-15 NOTE — Telephone Encounter (Signed)
Attempted to call patient. LMTCB 04/15/2019   

## 2019-04-15 NOTE — Telephone Encounter (Signed)
Patient wife states pt had blood work at [facility] GI last Thursday (7/2), and they advised him his potassium was elevated. Please call to discuss if patient needs to adjust medication

## 2019-04-15 NOTE — Telephone Encounter (Signed)
Returned call to Ms. Haskin, okay per DPR.   Pt had labs taken at [facility] GI last week. K CL found to be slightly elevated at 5.2. Wife wanting to make [place] aware to ensure there are no medication changes needed.   She reports that he has been taking his medications as ordered, no extra doses of K Cl.   She reports that he is coming in to see Dr. Caryl Comes this Thursday. I went ahead and arranged for pt to have lab redraw 7/9 prior to appt with Caryl Comes.   Orders placed for CBC, CMET (according to Dr. Aquilla Hacker last request).   Pt denies any issues. I asked pt to continue with current medication regimen at this time and call if any issues come up.   I will send mychart message with information on foods that are high in K Cl to make pt aware.   Message routed to Dr. Caryl Comes to further advise if needed.   ______     [id number] Pre-Screening Questions:  . In the past 7 to 10 days have you had a cough,  shortness of breath, headache, congestion, fever (100 or greater) body aches, chills, sore throat, or sudden loss of taste or sense of smell?no . Have you been around anyone with known Covid 19.no . Have you been around anyone who is awaiting Covid 19 test results in the past 7 to 10 days?no . Have you been around anyone who has been exposed to Covid 19, or has mentioned symptoms of Covid 19 within the past 7 to 10 days? no  If you have any concerns/questions about symptoms patients report during screening (either on the phone or at threshold). Contact the provider seeing the patient or DOD for further guidance.  If neither are available contact a member of the leadership team.      Reviewed clinic procedures for CV 19.    Wife will need to accompany pt to clinic for hearing/memory impairment.

## 2019-04-15 NOTE — Telephone Encounter (Signed)
Patient wife returning call  °

## 2019-04-16 NOTE — Telephone Encounter (Signed)
Iva   Good am His MAR says he is taking KCL  Is that true ?  If so we should stop it  Thanks  sk

## 2019-04-17 ENCOUNTER — Telehealth: Payer: Self-pay

## 2019-04-17 NOTE — Telephone Encounter (Signed)

## 2019-04-18 ENCOUNTER — Encounter: Payer: Self-pay | Admitting: Internal Medicine

## 2019-04-18 ENCOUNTER — Other Ambulatory Visit
Admission: RE | Admit: 2019-04-18 | Discharge: 2019-04-18 | Disposition: A | Discharge status: Home / Self Care | Payer: Medicare Other | Source: Ambulatory Visit | Attending: Internal Medicine | Admitting: Internal Medicine

## 2019-04-18 ENCOUNTER — Ambulatory Visit (INDEPENDENT_AMBULATORY_CARE_PROVIDER_SITE_OTHER): Payer: Medicare Other | Admitting: Internal Medicine

## 2019-04-18 ENCOUNTER — Other Ambulatory Visit: Payer: Self-pay

## 2019-04-18 VITALS — BP 124/64 | HR 70 | Ht 73.0 in | Wt 272.0 lb

## 2019-04-18 DIAGNOSIS — I442 Atrioventricular block, complete: Secondary | ICD-10-CM | POA: Diagnosis not present

## 2019-04-18 DIAGNOSIS — I4819 Other persistent atrial fibrillation: Secondary | ICD-10-CM

## 2019-04-18 DIAGNOSIS — Z950 Presence of cardiac pacemaker: Secondary | ICD-10-CM | POA: Diagnosis not present

## 2019-04-18 DIAGNOSIS — I480 Paroxysmal atrial fibrillation: Secondary | ICD-10-CM | POA: Insufficient documentation

## 2019-04-18 LAB — COMPREHENSIVE METABOLIC PANEL
ALT: 24 U/L (ref 0–44)
AST: 23 U/L (ref 15–41)
Albumin: 3.8 g/dL (ref 3.5–5.0)
Alkaline Phosphatase: 100 U/L (ref 38–126)
Anion gap: 13 (ref 5–15)
BUN: 63 mg/dL — ABNORMAL HIGH (ref 8–23)
CO2: 27 mmol/L (ref 22–32)
Calcium: 9 mg/dL (ref 8.9–10.3)
Chloride: 101 mmol/L (ref 98–111)
Creatinine, Ser: 2.3 mg/dL — ABNORMAL HIGH (ref 0.61–1.24)
GFR calc Af Amer: 32 mL/min — ABNORMAL LOW (ref >60)
GFR calc non Af Amer: 28 mL/min — ABNORMAL LOW (ref >60)
Glucose, Bld: 147 mg/dL — ABNORMAL HIGH (ref 70–99)
Potassium: 3.7 mmol/L (ref 3.5–5.1)
Sodium: 141 mmol/L (ref 135–145)
Total Bilirubin: 0.6 mg/dL (ref 0.3–1.2)
Total Protein: 7 g/dL (ref 6.5–8.1)

## 2019-04-18 LAB — CBC
HCT: 40.9 % (ref 39.0–52.0)
Hemoglobin: 12.9 g/dL — ABNORMAL LOW (ref 13.0–17.0)
MCH: 29.1 pg (ref 26.0–34.0)
MCHC: 31.5 g/dL (ref 30.0–36.0)
MCV: 92.1 fL (ref 80.0–100.0)
Platelets: 322 10*3/uL (ref 150–400)
RBC: 4.44 MIL/uL (ref 4.22–5.81)
RDW: 15 % (ref 11.5–15.5)
WBC: 9.7 10*3/uL (ref 4.0–10.5)
nRBC: 0 % (ref 0.0–0.2)

## 2019-04-18 NOTE — Progress Notes (Signed)
Patient Care Team: Baxter Hire, MD as PCP - General (Internal Medicine) Alisa Graff, FNP as Nurse Practitioner (Cardiology) Minna Merritts, MD as Consulting Physician (Cardiology) Anabel Bene, MD as Referring Physician (Neurology)   HPI  Matthew Howe is a 73 y.o. male Seen following pacemaker implantation 2/16 by Dr. Greggory Brandy . He had presented to [facility] with chest pain and shortness of breath. He was found to be in complete heart block. He had an antecedent history of bifascicular block    Persistence of atrial fibrillation assoc with SOB Cardioversion was accomplished. Unfortunately didn't maintain sinus but for about 4 days-- his wife and he both felt that he was better  Interestingly his Cr elevated last fall, and this was concurrent with onset of persistent atrial fibrillation   Some edema  Saw GI and bowels are working and he is ecstatic       Quite fatigued    Catheterization 2/14 had demonstrated mild nonobstructive disease. DATE TEST EF   10/16 echo   55-60 %   3/18 Echo   55-60 %   03/2019 Echo  55-60  Asc Ao 3.9 LVH mild LAE (-/53/38)     Date Cr K Hgb  2/18 1.36  14.7  1/19  0.38 4.5 13.3  3/19   12.4  12/19 2.29 4.4 10.7  5/20 2.0 4.3 13.2         Past Medical History:  Diagnosis Date  . Atrial fibrillation ([place])   . Carpal tunnel syndrome on both sides   . CHF (congestive heart failure) ([place])   . CKD (chronic kidney disease) 10/2018   recent diagnosis  . Complete heart block ([place])    a. s/p St. Jude PPM 11/2014.  [name] Complication of anesthesia    stayed overnight dt under too long d/t taking Parkinson's medication (2013?)  . Coronary artery disease   . GERD (gastroesophageal reflux disease)    "not as bad as it used to be"  . Heart murmur   . History of recent blood transfusion 07/2018  . Hyperlipidemia   . Hypertension   . Parkinson's disease ([place])   . Presence of permanent cardiac pacemaker    St Jude  . Shortness of  breath dyspnea     Past Surgical History:  Procedure Laterality Date  . APPENDECTOMY  12/2010  . BACK SURGERY  08/06/2018   lower lumbarsacral region, has rods in place. (done at [facility])  . CARDIAC CATHETERIZATION  11/20/2012   Known CAD with one vessel coronary disease of left descending artery by cardiac cath.   [name] CARDIOVERSION N/A 11/21/2016   Procedure: CARDIOVERSION;  Surgeon: Deboraha Sprang, MD;  Location: [facility];  Service: Cardiovascular;  Laterality: N/A;  . CARDIOVERSION N/A 03/08/2019   Procedure: CARDIOVERSION;  Surgeon: Fay Records, MD;  Location: [facility] ENDOSCOPY;  Service: Cardiovascular;  Laterality: N/A;  . CARPAL TUNNEL RELEASE Right 10/22/2018   Procedure: CARPAL TUNNEL RELEASE;  Surgeon: Dereck Leep, MD;  Location: [facility] ORS;  Service: Orthopedics;  Laterality: Right;  . COLONOSCOPY  2018  . INSERT / REPLACE / REMOVE PACEMAKER    . PACEMAKER INSERTION  11-21-14   STJ Assurity dual chamber pacemaker implanted by Dr Rayann Heman for CHB  . PERMANENT PACEMAKER INSERTION N/A 11/21/2014   Procedure: PERMANENT PACEMAKER INSERTION;  Surgeon: Thompson Grayer, MD;  Location[facility] C[facility];  Service: Cardiovascular;  Laterality: N/A;  . TEE WITHOUT CARDIOVERSION N/A 11/21/2016  Procedure: TRANSESOPHAGEAL ECHOCARDIOGRAM (TEE);  Surgeon: Deboraha Sprang, MD;  Location: [facility] ORS;  Service: Cardiovascular;  Laterality: N/A;  . UPPER GI ENDOSCOPY      Current Outpatient Medications  Medication Sig Dispense Refill  . acetaminophen (TYLENOL) 500 MG tablet Take 500-1,000 mg by mouth every 6 (six) hours as needed (back pain.).     [name] allopurinol (ZYLOPRIM) 100 MG tablet Take 100 mg by mouth daily.    [name] allopurinol (ZYLOPRIM) 300 MG tablet Take 300 mg by mouth daily.  3  . bisacodyl (DULCOLAX) 5 MG EC tablet Take 5 mg by mouth daily as needed for moderate constipation.    . carboxymethylcellulose (REFRESH PLUS) 0.5 % SOLN Place 1 drop into both eyes daily as needed (dry eyes).     . carvedilol  (COREG) 12.5 MG tablet Take 0.5 tablets (6.25 mg total) by mouth 2 (two) times daily.  3  . desloratadine (CLARINEX) 5 MG tablet Take 5 mg by mouth daily.    [name] dicyclomine (BENTYL) 20 MG tablet Take 20 mg by mouth 3 (three) times daily with meals.     [name] ELIQUIS 5 MG TABS tablet TAKE 1 TABLET BY MOUTH TWICE A DAY 180 tablet 0  . flecainide (TAMBOCOR) 100 MG tablet Take 1 tablet (100 mg total) by mouth 2 (two) times daily. 60 tablet 6  . fluticasone (FLONASE) 50 MCG/ACT nasal spray Place 2 sprays into the nose daily as needed for allergies.     [name] lansoprazole (PREVACID) 15 MG capsule Take 15 mg by mouth daily as needed (indigestion).     . magnesium oxide (MAG-OX) 400 MG tablet Take 400 mg by mouth at bedtime.    . meloxicam (MOBIC) 15 MG tablet Take 15 mg by mouth every other day.     . metolazone (ZAROXOLYN) 2.5 MG tablet TAKE 1 TABLET BY MOUTH DAILY AS NEEDED (ONCE DAILY AS NEEDED FOR WEIGHT GAIN OR SWELLING.). 90 tablet 1  . Multiple Vitamins-Minerals (OCUVITE ADULT 50+ PO) Take 1 tablet by mouth daily.    . polyethylene glycol (MIRALAX / GLYCOLAX) packet Take 17 g by mouth daily as needed (constipation.).     [name] potassium chloride (K-DUR) 10 MEQ tablet Take 30 mEq by mouth 2 (two) times daily.    . pramipexole (MIRAPEX) 0.5 MG tablet Take 0.5 mg by mouth 3 (three) times daily.     . Red Yeast Rice 600 MG CAPS Take 600 mg by mouth daily.    . selegiline (ELDEPRYL) 5 MG capsule Take 5 mg by mouth 2 (two) times daily with a meal.     . torsemide (DEMADEX) 20 MG tablet Take 60 mg by mouth 2 (two) times daily.     No current facility-administered medications for this visit.     Allergies  Allergen Reactions  . Linzess [Linaclotide] Shortness Of Breath  . Codeine Diarrhea and Nausea And Vomiting  . Terazosin     Dizziness / altered mental state   . Amitiza [Lubiprostone] Other (See Comments)    Abdominal cramps  . Gabapentin Other (See Comments)    dizziness  . Norco  [Hydrocodone-Acetaminophen] Diarrhea and Nausea And Vomiting  . Omeprazole Diarrhea and Nausea And Vomiting  . Requip [Ropinirole] Nausea Only    Review of Systems negative except from HPI and PMH  Physical Exam BP 124/64 (BP Location: Left Arm, Patient Position: Sitting, Cuff Size: Normal)   Pulse 70   Ht 6\' 1"  (1.854 m)   Wt 272 lb (123.4  kg)   BMI 35.89 kg/m  Well developed and well nourished in no acute distress HENT normal Neck supple with JVP-flat Clear Device pocket well healed; without hematoma or erythema.  There is no tethering  Regular rate and rhythm, no  murmur Abd-soft with active BS No Clubbing cyanosis tr edema Skin-warm and dry A & Oriented  Grossly normal sensory and motor function  ECG atrial flutter with ventricular pacing  Undertook NIPS to restore sinus  Successful  Both he and his wife affirmed had not missed any anticoagulation     Assessment and  Plan  Complete heart block  Pacemaker-St. Jude The patient's device was interrogated.  The information was reviewed. No changes were made in the programming.     Parkinson's disease  Hypertension  Hypertensive heart disease   Renal insuff gd 3  HFpEF  Aortic root Dilitation  Atrial fibrillation-persistent    Persistent afib,  LA size does not make me sanguine that flecainide will be sufficient, and will prob need amiodarone with consideration for RFCA  For now back in sinus and will track it and his symptoms,  LRL in sinus is 60 and mode switch rate is 70 so should be able to distinguish  His renal function worsened with the onset of afib, dont know if this would be causative 2/2 impaired CO, but we will see,  In the interim, as he euvolemic today, will decrease his diuretic to daily from bid  BP well controlled  Will need advanced imaging of his Ao Root to validate the echo measurement   Have reached out re need for contrast for that study given Cr   More than 50% of 40 min was spent in  counseling related to the above

## 2019-04-18 NOTE — Patient Instructions (Addendum)
Medication Instructions:  - Your physician has recommended you make the following change in your medication:   1) Decrease demadex (torsemide) 20 mg- take 3 tablets (60 mg) by mouth ONCE daily  2) Decrease potassium 10 meq- take 3 tablets (30 meq) by mouth ONCE daily  If you need a refill on your cardiac medications before your next appointment, please call your pharmacy.   Lab work: - none ordered  If you have labs (blood work) drawn today and your tests are completely normal, you will receive your results only by: [name] MyChart Message (if you have MyChart) OR . A paper copy in the mail If you have any lab test that is abnormal or we need to change your treatment, we will call you to review the results.  Testing/Procedures: - none ordered  Follow-Up: At [facility], you and your health needs are our priority.  As part of our continuing mission to provide you with exceptional heart care, we have created designated Provider Care Teams.  These Care Teams include your primary Cardiologist (physician) and Advanced Practice Providers (APPs -  Physician Assistants and Nurse Practitioners) who all work together to provide you with the care you need, when you need it.  You will need a follow up appointment in 3 months with Dr. Caryl Comes (October) Please call our office 2 months in advance to schedule this appointment (call in mid August to schedule)   Any Other Special Instructions Will Be Listed Below (If Applicable). - Send a MyChart message in 7-10 days with your Heart Rate readings for Dr. Caryl Comes to review

## 2019-04-19 NOTE — Addendum Note (Signed)
Addended by: Alba Destine on: 04/19/2019 02:56 PM   Modules accepted: Orders

## 2019-04-19 NOTE — Telephone Encounter (Signed)
Patient seen in clinic on 04/18/2019 with Dr. Caryl Comes. Labs done- potassium addressed.  So office note.

## 2019-04-20 ENCOUNTER — Other Ambulatory Visit: Payer: Self-pay | Admitting: Cardiovascular Disease

## 2019-04-22 ENCOUNTER — Telehealth: Payer: Self-pay | Admitting: Internal Medicine

## 2019-04-22 NOTE — Telephone Encounter (Signed)
I spoke with Mrs. Hintz regarding the patient's labs (OK per DPR).   She advised that the GI dept was referring the patient to a nephrologist, but they are waiting on a call for the appointment. She will follow back up with GI to see if they have heard anything about the referral.  She also states she has been checking the patient's HR's and he has been running in the 50-60's since being seen in the office.  She will call back if they start to trend in the 70's as this could indicate the patient is back out of rhythm.   I advised I would forward to Dr. Caryl Comes as a FYI.

## 2019-04-22 NOTE — Telephone Encounter (Signed)
Notes recorded by Deboraha Sprang, MD on 04/19/2019 at 6:22 PM EDT  Please Inform Patient   Labs are normal x stable(2.3- Cr)  Does he have a nephrologist    Thanks

## 2019-04-22 NOTE — Telephone Encounter (Signed)
Pt's wt 123.4 kg, age 73, SCr 2.3, CrCl 51.42.

## 2019-04-22 NOTE — Telephone Encounter (Signed)
Refill Request.  

## 2019-04-23 ENCOUNTER — Other Ambulatory Visit: Payer: Self-pay | Admitting: *Deleted

## 2019-04-23 MED ORDER — CARVEDILOL 12.5 MG PO TABS: 6.2500 mg | ORAL_TABLET | Freq: Two times a day (BID) | ORAL | 0 refills | Status: DC

## 2019-05-01 ENCOUNTER — Other Ambulatory Visit: Payer: Self-pay | Admitting: Family

## 2019-05-02 NOTE — Telephone Encounter (Signed)
Thanks H SK

## 2019-05-07 ENCOUNTER — Ambulatory Visit (INDEPENDENT_AMBULATORY_CARE_PROVIDER_SITE_OTHER): Payer: Medicare Other | Admitting: *Deleted

## 2019-05-07 DIAGNOSIS — I442 Atrioventricular block, complete: Secondary | ICD-10-CM | POA: Diagnosis not present

## 2019-05-07 LAB — CUP PACEART REMOTE DEVICE CHECK
Battery Remaining Longevity: 124 mo
Battery Remaining Percentage: 95.5 %
Battery Voltage: 2.98 V
Brady Statistic AP VP Percent: 85 %
Brady Statistic AP VS Percent: 1 %
Brady Statistic AS VP Percent: 14 %
Brady Statistic AS VS Percent: 1 %
Brady Statistic RA Percent Paced: 7.8 %
Brady Statistic RV Percent Paced: 98 %
Date Time Interrogation Session: 20200728064237
Implantable Lead Implant Date: 20160212
Implantable Lead Implant Date: 20160212
Implantable Lead Location: 753859
Implantable Lead Location: 753860
Implantable Lead Model: 1948
Implantable Pulse Generator Implant Date: 20160212
Lead Channel Impedance Value: 450 Ohm
Lead Channel Impedance Value: 560 Ohm
Lead Channel Pacing Threshold Amplitude: 0.625 V
Lead Channel Pacing Threshold Amplitude: 1.25 V
Lead Channel Pacing Threshold Pulse Width: 0.4 ms
Lead Channel Pacing Threshold Pulse Width: 0.4 ms
Lead Channel Sensing Intrinsic Amplitude: 12 mV
Lead Channel Sensing Intrinsic Amplitude: 2.4 mV
Lead Channel Setting Pacing Amplitude: 1.5 V
Lead Channel Setting Pacing Amplitude: 2 V
Lead Channel Setting Pacing Pulse Width: 0.4 ms
Lead Channel Setting Sensing Sensitivity: 4 mV
Pulse Gen Model: 2240
Pulse Gen Serial Number: 3049931

## 2019-05-15 ENCOUNTER — Other Ambulatory Visit: Payer: Self-pay

## 2019-05-15 ENCOUNTER — Encounter
Admission: RE | Admit: 2019-05-15 | Discharge: 2019-05-15 | Disposition: A | Discharge status: Home / Self Care | Payer: Medicare Other | Source: Ambulatory Visit | Attending: Orthopedic Surgery | Admitting: Orthopedic Surgery

## 2019-05-15 DIAGNOSIS — Z01812 Encounter for preprocedural laboratory examination: Secondary | ICD-10-CM | POA: Insufficient documentation

## 2019-05-15 DIAGNOSIS — Z20828 Contact with and (suspected) exposure to other viral communicable diseases: Secondary | ICD-10-CM | POA: Diagnosis not present

## 2019-05-15 HISTORY — DX: Localized edema: R60.0

## 2019-05-15 NOTE — Patient Instructions (Signed)
Your procedure is scheduled on: 05/22/2019 Wed  Report to Same Day Surgery 2nd floor medical mall [facility] Entrance-take elevator on left to 2nd floor.  Check in with surgery information desk.) To find out your arrival time please call [phone] between 1PM - 3PM on 05/21/2019 Tues  Remember: Instructions that are not followed completely may result in serious medical risk, up to and including death, or upon the discretion of your surgeon and anesthesiologist your surgery may need to be rescheduled.    _x___ 1. Do not eat food after midnight the night before your procedure. You may drink clear liquids up to 2 hours before you are scheduled to arrive at the hospital for your procedure.  Do not drink clear liquids within 2 hours of your scheduled arrival to the hospital.  Clear liquids include  --Water or Apple juice without pulp  --Clear carbohydrate beverage such as ClearFast or Gatorade  --Black Coffee or Clear Tea (No milk, no creamers, do not add anything to                  the coffee or Tea Type 1 and type 2 diabetics should only drink water.   ____Ensure clear carbohydrate drink on the way to the hospital for bariatric patients  ____Ensure clear carbohydrate drink 3 hours before surgery.   No gum chewing or hard candies.     __x__ 2. No Alcohol for 24 hours before or after surgery.   __x__3. No Smoking or e-cigarettes for 24 prior to surgery.  Do not use any chewable tobacco products for at least 6 hour prior to surgery   ____  4. Bring all medications with you on the day of surgery if instructed.    __x__ 5. Notify your doctor if there is any change in your medical condition     (cold, fever, infections).    x___6. On the morning of surgery brush your teeth with toothpaste and water.  You may rinse your mouth with mouth wash if you wish.  Do not swallow any toothpaste or mouthwash.   Do not wear jewelry, make-up, hairpins, clips or nail polish.  Do not wear lotions,  powders, or perfumes. You may wear deodorant.  Do not shave 48 hours prior to surgery. Men may shave face and neck.  Do not bring valuables to the hospital.    [facility] is not responsible for any belongings or valuables.               Contacts, dentures or bridgework may not be worn into surgery.  Leave your suitcase in the car. After surgery it may be brought to your room.  For patients admitted to the hospital, discharge time is determined by your                       treatment team.  _  Patients discharged the day of surgery will not be allowed to drive home.  You will need someone to drive you home and stay with you the night of your procedure.    Please read over the following fact sheets that you were given:   [facility] Preparing for Surgery and or MRSA Information   _x___ Take anti-hypertensive listed below, cardiac, seizure, asthma,     anti-reflux and psychiatric medicines. These include:  1. carvedilol (COREG) 12.5 MG tablet  2.lansoprazole (PREVACID) 15 MG capsule  3.pramipexole (MIRAPEX) 0.5 MG tablet  4.selegiline (ELDEPRYL) 5 MG capsule  5.  6.  ____Fleets enema or Magnesium Citrate as directed.   _x___ Use CHG Soap or sage wipes as directed on instruction sheet   ____ Use inhalers on the day of surgery and bring to hospital day of surgery  ____ Stop Metformin and Janumet 2 days prior to surgery.    ____ Take 1/2 of usual insulin dose the night before surgery and none on the morning     surgery.   _x___ Follow recommendations from Cardiologist, Pulmonologist or PCP regarding          stopping Aspirin, Coumadin, Plavix ,Eliquis, Effient, or Pradaxa, and Pletal.  X____Stop Anti-inflammatories such as Advil, Aleve, Ibuprofen, Motrin, Naproxen, Naprosyn, Goodies powders or aspirin products. OK to take Tylenol and                          Celebrex.   _x___ Stop supplements until after surgery.  But may continue Vitamin D, Vitamin B,       and  multivitamin.   ____ Bring C-Pap to the hospital.

## 2019-05-17 ENCOUNTER — Other Ambulatory Visit
Admission: RE | Admit: 2019-05-17 | Discharge: 2019-05-17 | Disposition: A | Discharge status: Home / Self Care | Payer: Medicare Other | Source: Ambulatory Visit | Attending: Orthopedic Surgery | Admitting: Orthopedic Surgery

## 2019-05-17 ENCOUNTER — Other Ambulatory Visit: Payer: Self-pay

## 2019-05-17 DIAGNOSIS — Z01812 Encounter for preprocedural laboratory examination: Secondary | ICD-10-CM | POA: Diagnosis not present

## 2019-05-17 LAB — SARS CORONAVIRUS 2 (TAT 6-24 HRS): SARS Coronavirus 2: NEGATIVE

## 2019-05-20 ENCOUNTER — Encounter: Payer: Self-pay | Admitting: Cardiology

## 2019-05-20 NOTE — Progress Notes (Signed)
Remote pacemaker transmission.   

## 2019-05-21 ENCOUNTER — Encounter: Payer: Self-pay | Admitting: Orthopedic Surgery

## 2019-05-22 ENCOUNTER — Ambulatory Visit: Payer: Medicare Other | Admitting: Anesthesiology

## 2019-05-22 ENCOUNTER — Encounter: Payer: Self-pay | Admitting: *Deleted

## 2019-05-22 ENCOUNTER — Other Ambulatory Visit: Payer: Self-pay

## 2019-05-22 ENCOUNTER — Ambulatory Visit
Admission: RE | Admit: 2019-05-22 | Discharge: 2019-05-22 | Disposition: A | Discharge status: Home / Self Care | Payer: Medicare Other | Attending: Orthopedic Surgery | Admitting: Orthopedic Surgery

## 2019-05-22 ENCOUNTER — Encounter
Admission: RE | Disposition: A | Discharge status: Home / Self Care | Payer: Self-pay | Source: Home / Self Care | Attending: Orthopedic Surgery

## 2019-05-22 DIAGNOSIS — G5602 Carpal tunnel syndrome, left upper limb: Secondary | ICD-10-CM | POA: Diagnosis present

## 2019-05-22 DIAGNOSIS — I2510 Atherosclerotic heart disease of native coronary artery without angina pectoris: Secondary | ICD-10-CM | POA: Diagnosis not present

## 2019-05-22 DIAGNOSIS — Z950 Presence of cardiac pacemaker: Secondary | ICD-10-CM | POA: Insufficient documentation

## 2019-05-22 DIAGNOSIS — Z6836 Body mass index (BMI) 36.0-36.9, adult: Secondary | ICD-10-CM | POA: Insufficient documentation

## 2019-05-22 DIAGNOSIS — I4891 Unspecified atrial fibrillation: Secondary | ICD-10-CM | POA: Diagnosis not present

## 2019-05-22 DIAGNOSIS — K219 Gastro-esophageal reflux disease without esophagitis: Secondary | ICD-10-CM | POA: Insufficient documentation

## 2019-05-22 DIAGNOSIS — I110 Hypertensive heart disease with heart failure: Secondary | ICD-10-CM | POA: Insufficient documentation

## 2019-05-22 DIAGNOSIS — E669 Obesity, unspecified: Secondary | ICD-10-CM | POA: Insufficient documentation

## 2019-05-22 DIAGNOSIS — G20 Parkinson's disease: Secondary | ICD-10-CM | POA: Insufficient documentation

## 2019-05-22 DIAGNOSIS — I509 Heart failure, unspecified: Secondary | ICD-10-CM | POA: Insufficient documentation

## 2019-05-22 HISTORY — PX: CARPAL TUNNEL RELEASE: SHX101

## 2019-05-22 SURGERY — CARPAL TUNNEL RELEASE
Anesthesia: General | Site: Hand | Laterality: Left

## 2019-05-22 MED ORDER — CHLORHEXIDINE GLUCONATE 4 % EX LIQD: 60.0000 mL | Freq: Once | CUTANEOUS | Status: DC

## 2019-05-22 MED ORDER — NEOMYCIN-POLYMYXIN B GU 40-200000 IR SOLN
Status: DC | PRN
  Administered 2019-05-22: 2 mL

## 2019-05-22 MED ORDER — FENTANYL CITRATE (PF) 100 MCG/2ML IJ SOLN
INTRAMUSCULAR | Status: AC
  Filled 2019-05-22: qty 2

## 2019-05-22 MED ORDER — BUPIVACAINE HCL (PF) 0.25 % IJ SOLN
INTRAMUSCULAR | Status: AC
  Filled 2019-05-22: qty 30

## 2019-05-22 MED ORDER — FENTANYL CITRATE (PF) 100 MCG/2ML IJ SOLN
INTRAMUSCULAR | Status: DC | PRN
  Administered 2019-05-22 (×2): 25 ug via INTRAVENOUS

## 2019-05-22 MED ORDER — ONDANSETRON HCL 4 MG/2ML IJ SOLN
INTRAMUSCULAR | Status: AC
  Filled 2019-05-22: qty 2

## 2019-05-22 MED ORDER — ONDANSETRON HCL 4 MG/2ML IJ SOLN
INTRAMUSCULAR | Status: DC | PRN
  Administered 2019-05-22: 4 mg via INTRAVENOUS

## 2019-05-22 MED ORDER — MIDAZOLAM HCL 2 MG/2ML IJ SOLN
INTRAMUSCULAR | Status: AC
  Filled 2019-05-22: qty 2

## 2019-05-22 MED ORDER — LACTATED RINGERS IV SOLN
INTRAVENOUS | Status: DC
  Administered 2019-05-22: 17:00:00 via INTRAVENOUS

## 2019-05-22 MED ORDER — PROPOFOL 500 MG/50ML IV EMUL
INTRAVENOUS | Status: DC | PRN
  Administered 2019-05-22: 50 ug/kg/min via INTRAVENOUS

## 2019-05-22 MED ORDER — MIDAZOLAM HCL 2 MG/2ML IJ SOLN
INTRAMUSCULAR | Status: DC | PRN
  Administered 2019-05-22: 2 mg via INTRAVENOUS

## 2019-05-22 MED ORDER — FENTANYL CITRATE (PF) 100 MCG/2ML IJ SOLN: 25.0000 ug | INTRAMUSCULAR | Status: DC | PRN

## 2019-05-22 MED ORDER — PROPOFOL 10 MG/ML IV BOLUS
INTRAVENOUS | Status: DC | PRN
  Administered 2019-05-22: 30 mg via INTRAVENOUS
  Administered 2019-05-22 (×2): 20 mg via INTRAVENOUS
  Administered 2019-05-22: 30 mg via INTRAVENOUS

## 2019-05-22 MED ORDER — BUPIVACAINE HCL (PF) 0.25 % IJ SOLN
INTRAMUSCULAR | Status: DC | PRN
  Administered 2019-05-22: 10 mL

## 2019-05-22 MED ORDER — PROPOFOL 500 MG/50ML IV EMUL
INTRAVENOUS | Status: AC
  Filled 2019-05-22: qty 50

## 2019-05-22 MED ORDER — OXYCODONE HCL 5 MG PO TABS: 5.0000 mg | ORAL_TABLET | ORAL | 0 refills | Status: DC | PRN

## 2019-05-22 SURGICAL SUPPLY — 30 items
BNDG ELASTIC 3X5.8 VLCR NS LF (GAUZE/BANDAGES/DRESSINGS) ×3 IMPLANT
BNDG ELASTIC 4X5.8 VLCR STR LF (GAUZE/BANDAGES/DRESSINGS) ×3 IMPLANT
BNDG ESMARK 4X12 TAN STRL LF (GAUZE/BANDAGES/DRESSINGS) ×3 IMPLANT
BNDG STRETCH 4X75 STRL LF (GAUZE/BANDAGES/DRESSINGS) ×3 IMPLANT
CANISTER SUCT 1200ML W/VALVE (MISCELLANEOUS) ×3 IMPLANT
CAST PADDING 3X4FT ST 30246 (SOFTGOODS) ×2
COVER WAND RF STERILE (DRAPES) ×3 IMPLANT
CUFF TOURN SGL QUICK 18X4 (TOURNIQUET CUFF) ×3 IMPLANT
DRSG DERMACEA 8X12 NADH (GAUZE/BANDAGES/DRESSINGS) ×3 IMPLANT
DURAPREP 26ML APPLICATOR (WOUND CARE) ×3 IMPLANT
ELECT CAUTERY BLADE 6.4 (BLADE) ×3 IMPLANT
ELECT REM PT RETURN 9FT ADLT (ELECTROSURGICAL) ×3
ELECTRODE REM PT RTRN 9FT ADLT (ELECTROSURGICAL) ×1 IMPLANT
GAUZE SPONGE 4X4 12PLY STRL (GAUZE/BANDAGES/DRESSINGS) ×3 IMPLANT
GAUZE SPONGE 4X4 16PLY XRAY LF (GAUZE/BANDAGES/DRESSINGS) ×3 IMPLANT
GLOVE BIOGEL M STRL SZ7.5 (GLOVE) ×3 IMPLANT
GLOVE INDICATOR 8.0 STRL GRN (GLOVE) ×3 IMPLANT
GOWN STRL REUS W/ TWL LRG LVL3 (GOWN DISPOSABLE) ×2 IMPLANT
GOWN STRL REUS W/TWL LRG LVL3 (GOWN DISPOSABLE) ×4
KIT TURNOVER KIT A (KITS) ×3 IMPLANT
NS IRRIG 500ML POUR BTL (IV SOLUTION) ×3 IMPLANT
PACK EXTREMITY ARMC (MISCELLANEOUS) ×3 IMPLANT
PAD CAST CTTN 3X4 STRL (SOFTGOODS) ×1 IMPLANT
PADDING CAST 3IN STRL (MISCELLANEOUS) ×2
PADDING CAST BLEND 3X4 STRL (MISCELLANEOUS) ×1 IMPLANT
SOL PREP PVP 2OZ (MISCELLANEOUS) ×3
SOLUTION PREP PVP 2OZ (MISCELLANEOUS) ×1 IMPLANT
SPLINT CAST 1 STEP 3X12 (MISCELLANEOUS) ×3 IMPLANT
STOCKINETTE STRL 4IN 9604848 (GAUZE/BANDAGES/DRESSINGS) ×3 IMPLANT
SUT ETHILON 5-0 FS-2 18 BLK (SUTURE) ×3 IMPLANT

## 2019-05-22 NOTE — Anesthesia Preprocedure Evaluation (Addendum)
Anesthesia Evaluation  Patient identified by MRN, date of birth, ID band Patient awake    Reviewed: Allergy & Precautions, H&P , NPO status , Patient's Chart, lab work & pertinent test results  History of Anesthesia Complications (+) PROLONGED EMERGENCE and history of anesthetic complications  Airway Mallampati: III  TM Distance: >3 FB Neck ROM: full    Dental  (+) Missing, Chipped   Pulmonary shortness of breath, neg COPD, Not current smoker,     + decreased breath sounds      Cardiovascular hypertension, + CAD and +CHF  + dysrhythmias (CHB s/p pacemaker) Atrial Fibrillation + pacemaker  Rhythm:regular Rate:Normal  Echo 03/15/19: 1. The left ventricle has low normal systolic function, with an ejection fraction of 50-55%. The cavity size was normal. There is mildly increased left ventricular wall thickness. Left ventricular diastolic Doppler parameters are indeterminate. Septal wall measuring 1.36 cm appears to be concentric.  2. The right ventricle has normal systolic function. The cavity was normal. There is no increase in right ventricular wall thickness.  3. Left atrial size was severely dilated.  4. There is dilatation of the aortic root and ascending aorta. 3.9 cm  5. Rhythm concerning for atrial fib/flutter  6. Challenging images   Neuro/Psych Parkinson's disease Carpal tunnel  negative psych ROS   GI/Hepatic Neg liver ROS, GERD  Controlled,  Endo/Other  negative endocrine ROS  Renal/GU CRFRenal disease  negative genitourinary   Musculoskeletal   Abdominal   Peds  Hematology negative hematology ROS (+)   Anesthesia Other Findings Obese  Past Medical History: No date: Atrial fibrillation (HCC) No date: Atrial fibrillation (HCC) No date: Carpal tunnel syndrome on both sides No date: CHF (congestive heart failure) ([place]) 10/2018: CKD (chronic kidney disease)     Comment:  recent diagnosis No date: Complete  heart block ([place])     Comment:  a. s/p St. Jude PPM 11/2014. No date: Complication of anesthesia     Comment:  stayed overnight dt under too long d/t taking               Parkinson's medication (2013?) No date: Coronary artery disease No date: GERD (gastroesophageal reflux disease)     Comment:  "not as bad as it used to be" No date: Heart murmur 07/2018: History of recent blood transfusion No date: Hyperlipidemia No date: Hypertension No date: Lower extremity edema No date: Parkinson's disease ([place]) No date: Presence of permanent cardiac pacemaker     Comment:  St Jude No date: Shortness of breath dyspnea  Past Surgical History: 12/2010: APPENDECTOMY 08/06/2018: BACK SURGERY     Comment:  lower lumbarsacral region, has rods in place. (done at               [place]) 11/20/2012: CARDIAC CATHETERIZATION     Comment:  Known CAD with one vessel coronary disease of left               descending artery by cardiac cath.  11/21/2016: CARDIOVERSION; N/A     Comment:  Procedure: CARDIOVERSION;  Surgeon: Deboraha Sprang, MD;               Location: [facility];  Service: Cardiovascular;                Laterality: N/A; 03/08/2019: CARDIOVERSION; N/A     Comment:  Procedure: CARDIOVERSION;  Surgeon: Fay Records, MD;                Location:  [facility] ENDOSCOPY;  Service: Cardiovascular;                Laterality: N/A; 10/22/2018: CARPAL TUNNEL RELEASE; Right     Comment:  Procedure: CARPAL TUNNEL RELEASE;  Surgeon: Dereck Leep, MD;  Location: [facility] ORS;  Service: Orthopedics;               Laterality: Right; 2018: COLONOSCOPY No date: INSERT / REPLACE / REMOVE PACEMAKER 11-21-14: PACEMAKER INSERTION     Comment:  STJ Assurity dual chamber pacemaker implanted by Dr               Rayann Heman for CHB 11/21/2014: PERMANENT PACEMAKER INSERTION; N/A     Comment:  Procedure: PERMANENT PACEMAKER INSERTION;  Surgeon:               Thompson Grayer, MD;  Location: [facility] CATH LAB;  Service:                Cardiovascular;  Laterality: N/A; 11/21/2016: TEE WITHOUT CARDIOVERSION; N/A     Comment:  Procedure: TRANSESOPHAGEAL ECHOCARDIOGRAM (TEE);                Surgeon: Deboraha Sprang, MD;  Location: [facility] ORS;                Service: Cardiovascular;  Laterality: N/A; No date: UPPER GI ENDOSCOPY  BMI    Body Mass Index: 36.07 kg/m      Reproductive/Obstetrics negative OB ROS                          Anesthesia Physical Anesthesia Plan  ASA: III  Anesthesia Plan: General   Post-op Pain Management:    Induction:   PONV Risk Score and Plan: Propofol infusion and TIVA  Airway Management Planned: Simple Face Mask and Natural Airway  Additional Equipment:   Intra-op Plan:   Post-operative Plan:   Informed Consent: I have reviewed the patients History and Physical, chart, labs and discussed the procedure including the risks, benefits and alternatives for the proposed anesthesia with the patient or authorized representative who has indicated his/her understanding and acceptance.     Dental Advisory Given  Plan Discussed with: Anesthesiologist, CRNA and Surgeon  Anesthesia Plan Comments:         Anesthesia Quick Evaluation

## 2019-05-22 NOTE — H&P (Signed)
The patient has been re-examined, and the chart reviewed, and there have been no interval changes to the documented history and physical.    The risks, benefits, and alternatives have been discussed at length. The patient expressed understanding of the risks benefits and agreed with plans for surgical intervention.  James P. Hooten, Jr. M.D.    

## 2019-05-22 NOTE — Op Note (Signed)
OPERATIVE NOTE  DATE OF SURGERY:  05/22/2019  PATIENT NAME:  Matthew Howe   DOB: 1946/01/08  MRN: [id number]  PRE-OPERATIVE DIAGNOSIS: Left carpal tunnel syndrome  POST-OPERATIVE DIAGNOSIS:  Same  PROCEDURE:  Left carpal tunnel release  SURGEON:  Marciano Sequin. M.D.  ANESTHESIA: MAC  ESTIMATED BLOOD LOSS: minimal  FLUIDS REPLACED: 300 mL of crystalloid  TOURNIQUET TIME: 33 minutes  DRAINS: None  INDICATIONS FOR SURGERY: Matthew Howe is a 73 y.o. year old male with a long history of numbness and paresthesias to the left hand. EMG/nerve conduction studies demonstrated findings consistent with carpal tunnel syndrome.The patient had not seen any significant improvement despite conservative nonsurgical intervention. After discussion of the risks and benefits of surgical intervention, the patient expressed understanding of the risks benefits and agree with plans for carpal tunnel release.   PROCEDURE IN DETAIL: The patient was brought into the operating room and after adequate MAC anesthesia, a tourniquet was placed on the patient's left upper arm.The left hand and arm were prepped with alcohol and Duraprep and draped in the usual sterile fashion. A "time-out" was performed as per usual protocol. The hand and forearm were exsanguinated using an Esmarch and the tourniquet was inflated to 250 mmHg. Loupe magnification was used throughout the procedure. An incision was made just ulnar to the thenar palmar crease. Dissection was carried down through the palmar fascia to the transverse carpal ligament. The transverse carpal ligament was sharply incised, taking care to protect the underlying structures with the carpal tunnel. Complete release of the transverse carpal ligament was achieved. There was no evidence of ganglion cyst or lipoma within the carpal tunnel. The wound was irrigated with copious amounts of normal saline with antibiotic solution. The skin was then re-approximated with  interrupted sutures of #5-0 nylon. A sterile dressing was applied followed by application of a volar splint. The tourniquet was deflated with a total tourniquet time of 33 minutes.  The patient tolerated the procedure well and was transported to the PACU in stable condition.   P. [name]., M.D.

## 2019-05-22 NOTE — Anesthesia Post-op Follow-up Note (Signed)
Anesthesia QCDR form completed.        

## 2019-05-22 NOTE — Discharge Instructions (Signed)
°  Instructions after Hand / Wrist Surgery ° ° James P. Hooten, Jr., M.D. ° Dept. of Orthopaedics & Sports Medicine ° [facility] ° [address] ° [place], [place]  [address] ° ° Phone: [phone]   Fax: [phone] ° ° °DIET: °• Drink plenty of non-alcoholic fluids & begin a light diet. °• Resume your normal diet the day after surgery. ° °ACTIVITY:  °• Keep the hand elevated above the level of the elbow. °• Begin gently moving the fingers on a regular basis to avoid stiffness. °• Avoid any heavy lifting, pushing, or pulling with the operative hand. °• Do not drive or operate any equipment until instructed. ° °WOUND CARE:  °• Keep the splint/bandage clean and dry.  °• The splint and stitches will be removed in the office. °• Continue to use the ice packs periodically to reduce pain and swelling. °• You may bathe or shower after the stitches are removed at the first office visit following surgery. ° °MEDICATIONS: °• You may resume your regular medications. °• Please take the pain medication as prescribed. °• Do not take pain medication on an empty stomach. °• Do not drive or drink alcoholic beverages when taking pain medications. ° °CALL THE OFFICE FOR: °• Temperature above 101 degrees °• Excessive bleeding or drainage on the dressing. °• Excessive swelling, coldness, or paleness of the fingers. °• Persistent nausea and vomiting. ° °FOLLOW-UP:  °• You should have an appointment to return to the office in 7-10 days after surgery.  ° °REMEMBER: R.I.C.E. = Rest, Ice, Compression, Elevation !  ° ° ° °AMBULATORY SURGERY  °DISCHARGE INSTRUCTIONS ° ° °1) The drugs that you were given will stay in your system until tomorrow so for the next 24 hours you should not: ° °A) Drive an automobile °B) Make any legal decisions °C) Drink any alcoholic beverage ° ° °2) You may resume regular meals tomorrow.  Today it is better to start with liquids and gradually work up to solid foods. ° °You may eat anything you prefer, but  it is better to start with liquids, then soup and crackers, and gradually work up to solid foods. ° ° °3) Please notify your doctor immediately if you have any unusual bleeding, trouble breathing, redness and pain at the surgery site, drainage, fever, or pain not relieved by medication. ° ° ° °4) Additional Instructions: ° ° ° ° ° ° ° °Please contact your physician with any problems or Same Day Surgery at [phone], Monday through Friday 6 am to 4 pm, or  at  Main number at [phone]. °

## 2019-05-22 NOTE — Transfer of Care (Signed)
Immediate Anesthesia Transfer of Care Note  Patient: Matthew Howe  Procedure(s) Performed: CARPAL TUNNEL RELEASE - LEFT (Left Hand)  Patient Location: PACU  Anesthesia Type:MAC  Level of Consciousness: sedated  Airway & Oxygen Therapy: Patient Spontanous Breathing and Patient connected to face mask oxygen  Post-op Assessment: Report given to RN and Post -op Vital signs reviewed and stable  Post vital signs: Reviewed and stable  Last Vitals:  Vitals Value Taken Time  BP 113/68 05/22/19 1832  Temp    Pulse 59 05/22/19 1834  Resp 12 05/22/19 1834  SpO2 99 % 05/22/19 1834  Vitals shown include unvalidated device data.  Last Pain:  Vitals:   05/22/19 1341  PainSc: 0-No pain         Complications: No apparent anesthesia complications

## 2019-05-23 NOTE — Anesthesia Postprocedure Evaluation (Signed)
Anesthesia Post Note  Patient: Matthew Howe  Procedure(s) Performed: CARPAL TUNNEL RELEASE - LEFT (Left Hand)  Patient location during evaluation: PACU Anesthesia Type: General Level of consciousness: awake and alert and oriented Pain management: pain level controlled Vital Signs Assessment: post-procedure vital signs reviewed and stable Respiratory status: spontaneous breathing Cardiovascular status: blood pressure returned to baseline Anesthetic complications: no     Last Vitals:  Vitals:   05/22/19 1902 05/22/19 1917  BP: (!) 142/75 (!) 105/91  Pulse: 65 60  Resp: 17 19  Temp:  (!) 36.3 C  SpO2: 99% 99%    Last Pain:  Vitals:   05/22/19 1917  PainSc: 0-No pain                 Keiri Solano

## 2019-06-28 ENCOUNTER — Other Ambulatory Visit: Payer: Self-pay | Admitting: Nephrology

## 2019-06-28 DIAGNOSIS — N183 Chronic kidney disease, stage 3 unspecified: Secondary | ICD-10-CM

## 2019-07-08 ENCOUNTER — Ambulatory Visit
Admission: RE | Admit: 2019-07-08 | Discharge: 2019-07-08 | Disposition: A | Discharge status: Home / Self Care | Payer: Medicare Other | Source: Ambulatory Visit | Attending: Nephrology | Admitting: Nephrology

## 2019-07-08 ENCOUNTER — Other Ambulatory Visit: Payer: Self-pay

## 2019-07-08 DIAGNOSIS — N281 Cyst of kidney, acquired: Secondary | ICD-10-CM | POA: Diagnosis not present

## 2019-07-08 DIAGNOSIS — N183 Chronic kidney disease, stage 3 unspecified: Secondary | ICD-10-CM

## 2019-07-23 ENCOUNTER — Encounter: Payer: Self-pay | Admitting: Internal Medicine

## 2019-07-23 ENCOUNTER — Other Ambulatory Visit: Payer: Self-pay

## 2019-07-23 ENCOUNTER — Ambulatory Visit (INDEPENDENT_AMBULATORY_CARE_PROVIDER_SITE_OTHER): Payer: Medicare Other | Admitting: Internal Medicine

## 2019-07-23 VITALS — BP 112/60 | HR 60 | Temp 97.6°F | Ht 73.0 in | Wt 275.2 lb

## 2019-07-23 DIAGNOSIS — I442 Atrioventricular block, complete: Secondary | ICD-10-CM

## 2019-07-23 DIAGNOSIS — I10 Essential (primary) hypertension: Secondary | ICD-10-CM

## 2019-07-23 DIAGNOSIS — I4819 Other persistent atrial fibrillation: Secondary | ICD-10-CM

## 2019-07-23 DIAGNOSIS — Z950 Presence of cardiac pacemaker: Secondary | ICD-10-CM

## 2019-07-23 DIAGNOSIS — Z79899 Other long term (current) drug therapy: Secondary | ICD-10-CM

## 2019-07-23 LAB — CUP PACEART INCLINIC DEVICE CHECK
Battery Remaining Longevity: 121 mo
Battery Voltage: 2.98 V
Brady Statistic RA Percent Paced: 28 %
Brady Statistic RV Percent Paced: 98 %
Date Time Interrogation Session: 20201013102621
Implantable Lead Implant Date: 20160212
Implantable Lead Implant Date: 20160212
Implantable Lead Location: 753859
Implantable Lead Location: 753860
Implantable Lead Model: 1948
Implantable Pulse Generator Implant Date: 20160212
Lead Channel Impedance Value: 512.5 Ohm
Lead Channel Impedance Value: 575 Ohm
Lead Channel Pacing Threshold Amplitude: 0.75 V
Lead Channel Pacing Threshold Amplitude: 1.375 V
Lead Channel Pacing Threshold Pulse Width: 0.4 ms
Lead Channel Pacing Threshold Pulse Width: 0.4 ms
Lead Channel Setting Pacing Amplitude: 1.625
Lead Channel Setting Pacing Amplitude: 2 V
Lead Channel Setting Pacing Pulse Width: 0.4 ms
Lead Channel Setting Sensing Sensitivity: 4 mV
Pulse Gen Model: 2240
Pulse Gen Serial Number: 3049931

## 2019-07-23 NOTE — Progress Notes (Signed)
Patient Care Team: Baxter Hire, MD as PCP - General (Internal Medicine) Alisa Graff, FNP as Nurse Practitioner (Cardiology) Minna Merritts, MD as Consulting Physician (Cardiology) Anabel Bene, MD as Referring Physician (Neurology)   HPI  Matthew Howe is a 73 y.o. male Seen following pacemaker implantation 2/16 by Dr. Greggory Brandy . He had presented to [facility] with chest pain and shortness of breath. He was found to be in complete heart block. He had an antecedent history of bifascicular block    Persistence of atrial fibrillation assoc with SOB Cardioversion was accomplished. Unfortunately didn't maintain sinus but for about 4 days-- his wife and he both felt that he was better  Recurrent atrial fibrillation/ tach   Has been holding sinus rhythm since August as determined by his wife checking of his pulse.  (Lower rate limit on his pacemaker is 60 and his mode switch rate is 70) he has more energy.  Over the last couple of weeks he has accumulated more fluid.  He is working on salt restriction and fluid restriction.  It seems no avail.  Has seen nephrology.  Encouraged to restrict salt and water    Catheterization 2/14 had demonstrated mild nonobstructive disease. DATE TEST EF   10/16 echo   55-60 %   3/18 Echo   55-60 %   03/2019 Echo  55-60  Asc Ao 3.9 LVH mild LAE (-/53/38)     Date Cr K Hgb  2/18 1.36  14.7  1/19  0.38 4.5 13.3  3/19   12.4  12/19 2.29 4.4 10.7  5/20 2.0 4.3 13.2  8/20 2.3           Past Medical History:  Diagnosis Date  . Atrial fibrillation ([place])   . Atrial fibrillation ([place])   . Carpal tunnel syndrome on both sides   . CHF (congestive heart failure) ([place])   . CKD (chronic kidney disease) 10/2018   recent diagnosis  . Complete heart block ([place])    a. s/p St. Jude PPM 11/2014.  [name] Complication of anesthesia    stayed overnight dt under too long d/t taking Parkinson's medication (2013?)  . Coronary artery disease   . GERD  (gastroesophageal reflux disease)    "not as bad as it used to be"  . Heart murmur   . History of recent blood transfusion 07/2018  . Hyperlipidemia   . Hypertension   . Lower extremity edema   . Parkinson's disease ([place])   . Presence of permanent cardiac pacemaker    St Jude  . Shortness of breath dyspnea     Past Surgical History:  Procedure Laterality Date  . APPENDECTOMY  12/2010  . BACK SURGERY  08/06/2018   lower lumbarsacral region, has rods in place. (done at [facility])  . CARDIAC CATHETERIZATION  11/20/2012   Known CAD with one vessel coronary disease of left descending artery by cardiac cath.   [name] CARDIOVERSION N/A 11/21/2016   Procedure: CARDIOVERSION;  Surgeon: Deboraha Sprang, MD;  Location: [facility];  Service: Cardiovascular;  Laterality: N/A;  . CARDIOVERSION N/A 03/08/2019   Procedure: CARDIOVERSION;  Surgeon: Fay Records, MD;  Location: [facility] ENDOSCOPY;  Service: Cardiovascular;  Laterality: N/A;  . CARPAL TUNNEL RELEASE Right 10/22/2018   Procedure: CARPAL TUNNEL RELEASE;  Surgeon: Dereck Leep, MD;  Location: [facility] ORS;  Service: Orthopedics;  Laterality: Right;  . CARPAL TUNNEL RELEASE Left 05/22/2019   Procedure: CARPAL TUNNEL RELEASE -  LEFT;  Surgeon: Dereck Leep, MD;  Location: [facility] ORS;  Service: Orthopedics;  Laterality: Left;  . COLONOSCOPY  2018  . INSERT / REPLACE / REMOVE PACEMAKER    . PACEMAKER INSERTION  11-21-14   STJ Assurity dual chamber pacemaker implanted by Dr Rayann Heman for CHB  . PERMANENT PACEMAKER INSERTION N/A 11/21/2014   Procedure: PERMANENT PACEMAKER INSERTION;  Surgeon: Thompson Grayer, MD;  Location: [facility] CATH LAB;  Service: Cardiovascular;  Laterality: N/A;  . TEE WITHOUT CARDIOVERSION N/A 11/21/2016   Procedure: TRANSESOPHAGEAL ECHOCARDIOGRAM (TEE);  Surgeon: Deboraha Sprang, MD;  Location: [facility] ORS;  Service: Cardiovascular;  Laterality: N/A;  . UPPER GI ENDOSCOPY      Current Outpatient Medications  Medication Sig Dispense Refill  .  acetaminophen (TYLENOL) 500 MG tablet Take 500-1,000 mg by mouth every 6 (six) hours as needed (back pain.).     [name] allopurinol (ZYLOPRIM) 100 MG tablet Take 100 mg by mouth daily.    [name] allopurinol (ZYLOPRIM) 300 MG tablet Take 300 mg by mouth daily.  3  . bisacodyl (DULCOLAX) 5 MG EC tablet Take 5 mg by mouth daily as needed for moderate constipation.    . carboxymethylcellulose (REFRESH PLUS) 0.5 % SOLN Place 1 drop into both eyes daily as needed (dry eyes).     . carvedilol (COREG) 12.5 MG tablet Take 0.5 tablets (6.25 mg total) by mouth 2 (two) times daily. 90 tablet 0  . desloratadine (CLARINEX) 5 MG tablet Take 5 mg by mouth daily.    [name] ELIQUIS 5 MG TABS tablet TAKE 1 TABLET BY MOUTH TWICE A DAY 180 tablet 1  . flecainide (TAMBOCOR) 100 MG tablet Take 1 tablet (100 mg total) by mouth 2 (two) times daily. 60 tablet 6  . fluticasone (FLONASE) 50 MCG/ACT nasal spray Place 2 sprays into the nose daily as needed for allergies.     [name] lansoprazole (PREVACID) 15 MG capsule Take 15 mg by mouth daily as needed (indigestion).     . magnesium oxide (MAG-OX) 400 MG tablet Take 400 mg by mouth at bedtime.    . meloxicam (MOBIC) 15 MG tablet Take 15 mg by mouth every other day.     . metolazone (ZAROXOLYN) 2.5 MG tablet TAKE 1 TABLET BY MOUTH DAILY AS NEEDED (ONCE DAILY AS NEEDED FOR WEIGHT GAIN OR SWELLING.). 90 tablet 1  . Multiple Vitamins-Minerals (OCUVITE ADULT 50+ PO) Take 1 tablet by mouth daily.    . polyethylene glycol (MIRALAX / GLYCOLAX) packet Take 17 g by mouth daily as needed (constipation.).     [name] potassium chloride (K-DUR) 10 MEQ tablet Take 3 tablet (30 meq) by mouth once daily    . pramipexole (MIRAPEX) 0.5 MG tablet Take 0.5 mg by mouth 3 (three) times daily.     . Red Yeast Rice 600 MG CAPS Take 600 mg by mouth daily.    . selegiline (ELDEPRYL) 5 MG capsule Take 5 mg by mouth 2 (two) times daily with a meal.     . torsemide (DEMADEX) 20 MG tablet Take 3 tablet (60 mg) by mouth once  daily    . oxyCODONE (ROXICODONE) 5 MG immediate release tablet Take 1-2 tablets (5-10 mg total) by mouth every 4 (four) hours as needed for severe pain. (Patient not taking: Reported on 07/23/2019) 10 tablet 0   No current facility-administered medications for this visit.     Allergies  Allergen Reactions  . Linzess [Linaclotide] Shortness Of Breath  . Codeine Diarrhea and Nausea  And Vomiting  . Terazosin     Dizziness / altered mental state   . Amitiza [Lubiprostone] Other (See Comments)    Abdominal cramps  . Gabapentin Other (See Comments)    dizziness  . Norco [Hydrocodone-Acetaminophen] Diarrhea and Nausea And Vomiting  . Omeprazole Diarrhea and Nausea And Vomiting  . Requip [Ropinirole] Nausea Only    Review of Systems negative except from HPI and PMH  Physical Exam BP 112/60 (BP Location: Right Arm, Patient Position: Sitting, Cuff Size: Normal)   Pulse 60   Temp 97.6 F (36.4 C)   Ht 6\' 1"  (D34-534 m)   Wt 275 lb 4 oz (124.9 kg)   BMI 36.31 kg/m  Well developed and well nourished in no acute distress HENT normal Neck supple with JVP-8 Clear Device pocket well healed; without hematoma or erythema.  There is no tethering  Regular rate and rhythm, no  gallop No  murmur Abd-soft with active BS No Clubbing cyanosis 2+ edema Skin-warm and dry A & Oriented  Grossly normal sensory and motor function  ECG AV pacing    Assessment and  Plan  Complete heart block  Pacemaker-St. Jude The patient's device was interrogated.  The information was reviewed. No changes were made in the programming.     Parkinson's disease  Hypertension  Hypertensive heart disease   Renal insuff gd 3  HFpEF  Aortic root Dilitation  Atrial fibrillation-persistent    Holding sinus rhythm.  Surprise surprise but great.  Feels better.  Continue with monitoring heart rate.  Volume overloaded.  I reviewed the physiology of diuretics.  He will go from 40 twice daily--60 every morning  (or 80 every morning of 60 does not work) do that twice a day for 3 days and then go back to once a day.  We will follow his weight.  Plan to check a metabolic profile in about a month.  The problem with foregoing diuresis is that he feels terrible.  It may have been deleterious impact on his renal function.  He is aware.  Blood pressure well controlled.  Hopefully not too low.  We spent more than 50% of our >25 min visit in face to face counseling regarding the above

## 2019-07-23 NOTE — Patient Instructions (Addendum)
Medication Instructions:  - Your physician has recommended you make the following change in your medication:   1) Torsemide 20 mg - when you get home today take 3 tablets (60 mg) - if by lunch you do not urinate well, then take 4 tablets (80 mg)  - once you determine what helps you urinate, then tomorrow you will either take: - 3 tablets (60 mg) twice daily x 3 days or 4 tablets (80 mg) twice daily x 3 days  - after the 3 days, you will take either - 3 tablets (60 mg) once daily or 4 tablets (80 mg) once daily  - please call the office and let us know what dose works the best for you so we may update you medication list.   If you need a refill on your cardiac medications before your next appointment, please call your pharmacy.   Lab work: - Your physician recommends that you return for lab work in: 4 weeks (around 08/23/19) BMP - Please report to the [facility], 1st desk on the right to check in - Monday-Friday (7:30 am- 5:30 pm)  If you have labs (blood work) drawn today and your tests are completely normal, you will receive your results only by: [name] MyChart Message (if you have MyChart) OR . A paper copy in the mail If you have any lab test that is abnormal or we need to change your treatment, we will call you to review the results.  Testing/Procedures: - none ordered  Follow-Up: At [facility], you and your health needs are our priority.  As part of our continuing mission to provide you with exceptional heart care, we have created designated Provider Care Teams.  These Care Teams include your primary Cardiologist (physician) and Advanced Practice Providers (APPs -  Physician Assistants and Nurse Practitioners) who all work together to provide you with the care you need, when you need it.  - You will need a follow up appointment in 3 months (January 2021) with Dr. Caryl Comes.  - Please call our office 2 months in advance to schedule this appointment. (Call in early to mid November  to schedule)  Any Other Special Instructions Will Be Listed Below (If Applicable). - N/A

## 2019-07-26 ENCOUNTER — Other Ambulatory Visit: Payer: Self-pay | Admitting: Internal Medicine

## 2019-07-30 ENCOUNTER — Other Ambulatory Visit: Payer: Self-pay | Admitting: Family

## 2019-08-05 MED ORDER — TORSEMIDE 20 MG PO TABS: 60.0000 mg | ORAL_TABLET | Freq: Every day | ORAL | 0 refills | Status: DC

## 2019-08-06 ENCOUNTER — Ambulatory Visit (INDEPENDENT_AMBULATORY_CARE_PROVIDER_SITE_OTHER): Payer: Medicare Other | Admitting: *Deleted

## 2019-08-06 DIAGNOSIS — I442 Atrioventricular block, complete: Secondary | ICD-10-CM

## 2019-08-06 DIAGNOSIS — I4819 Other persistent atrial fibrillation: Secondary | ICD-10-CM

## 2019-08-06 LAB — CUP PACEART REMOTE DEVICE CHECK
Battery Remaining Longevity: 118 mo
Battery Remaining Percentage: 95.5 %
Battery Voltage: 2.98 V
Brady Statistic AP VP Percent: 99 %
Brady Statistic AP VS Percent: 1 %
Brady Statistic AS VP Percent: 1 %
Brady Statistic AS VS Percent: 1 %
Brady Statistic RA Percent Paced: 99 %
Brady Statistic RV Percent Paced: 99 %
Date Time Interrogation Session: 20201027061135
Implantable Lead Implant Date: 20160212
Implantable Lead Implant Date: 20160212
Implantable Lead Location: 753859
Implantable Lead Location: 753860
Implantable Lead Model: 1948
Implantable Pulse Generator Implant Date: 20160212
Lead Channel Impedance Value: 490 Ohm
Lead Channel Impedance Value: 590 Ohm
Lead Channel Pacing Threshold Amplitude: 0.75 V
Lead Channel Pacing Threshold Amplitude: 1.25 V
Lead Channel Pacing Threshold Pulse Width: 0.4 ms
Lead Channel Pacing Threshold Pulse Width: 0.4 ms
Lead Channel Sensing Intrinsic Amplitude: 1.6 mV
Lead Channel Sensing Intrinsic Amplitude: 12 mV
Lead Channel Setting Pacing Amplitude: 1.5 V
Lead Channel Setting Pacing Amplitude: 2 V
Lead Channel Setting Pacing Pulse Width: 0.4 ms
Lead Channel Setting Sensing Sensitivity: 4 mV
Pulse Gen Model: 2240
Pulse Gen Serial Number: 3049931

## 2019-08-27 ENCOUNTER — Other Ambulatory Visit: Payer: Self-pay

## 2019-08-27 ENCOUNTER — Other Ambulatory Visit
Admission: RE | Admit: 2019-08-27 | Discharge: 2019-08-27 | Disposition: A | Discharge status: Home / Self Care | Payer: Medicare Other | Source: Ambulatory Visit | Attending: Internal Medicine | Admitting: Internal Medicine

## 2019-08-27 DIAGNOSIS — Z79899 Other long term (current) drug therapy: Secondary | ICD-10-CM | POA: Diagnosis present

## 2019-08-27 DIAGNOSIS — I4819 Other persistent atrial fibrillation: Secondary | ICD-10-CM | POA: Insufficient documentation

## 2019-08-27 LAB — BASIC METABOLIC PANEL
Anion gap: 11 (ref 5–15)
BUN: 45 mg/dL — ABNORMAL HIGH (ref 8–23)
CO2: 24 mmol/L (ref 22–32)
Calcium: 9.3 mg/dL (ref 8.9–10.3)
Chloride: 106 mmol/L (ref 98–111)
Creatinine, Ser: 1.53 mg/dL — ABNORMAL HIGH (ref 0.61–1.24)
GFR calc Af Amer: 52 mL/min — ABNORMAL LOW (ref >60)
GFR calc non Af Amer: 45 mL/min — ABNORMAL LOW (ref >60)
Glucose, Bld: 112 mg/dL — ABNORMAL HIGH (ref 70–99)
Potassium: 4.2 mmol/L (ref 3.5–5.1)
Sodium: 141 mmol/L (ref 135–145)

## 2019-08-27 NOTE — Progress Notes (Signed)
Remote pacemaker transmission.   

## 2019-09-17 ENCOUNTER — Other Ambulatory Visit: Payer: Self-pay | Admitting: Cardiovascular Disease

## 2019-09-17 MED ORDER — CARVEDILOL 12.5 MG PO TABS: 6.2500 mg | ORAL_TABLET | Freq: Two times a day (BID) | ORAL | 0 refills | Status: DC

## 2019-09-17 NOTE — Telephone Encounter (Signed)
Requested Prescriptions   Signed Prescriptions Disp Refills  . carvedilol (COREG) 12.5 MG tablet 90 tablet 0    Sig: Take 0.5 tablets (6.25 mg total) by mouth 2 (two) times daily.    Authorizing Provider: Deboraha Sprang    Ordering User: Raelene Bott, BRANDY L

## 2019-09-17 NOTE — Telephone Encounter (Signed)
°*  STAT* If patient is at the pharmacy, call can be transferred to refill team.   1. Which medications need to be refilled? (please list name of each medication and dose if known)  Carvedilol  6.25 mg po BID  2. Which pharmacy/location (including street and city if local pharmacy) is medication to be sent to? [facility]   3. Do they need a 30 day or 90 day supply? [place]

## 2019-10-13 ENCOUNTER — Other Ambulatory Visit: Payer: Self-pay

## 2019-10-14 MED ORDER — TORSEMIDE 20 MG PO TABS: 60.0000 mg | ORAL_TABLET | Freq: Every day | ORAL | 0 refills | Status: DC

## 2019-10-15 ENCOUNTER — Ambulatory Visit: Payer: Self-pay | Admitting: General Surgery

## 2019-10-15 NOTE — H&P (Signed)
PATIENT PROFILE: Matthew Howe is a 74 y.o. male who presents to the Clinic for consultation at the request of Dr. Ola Spurr for evaluation of umbilical hernia.  PCP:  Angelena Form, MD  HISTORY OF PRESENT ILLNESS: Matthew Howe reports having an umbilical hernia since at least 6 months ago.  He reported that he has been aggravating in the last few months.  The area is getting more painful with pressure.  The pain does not radiate to other part of the body.  The pain is aggravated by hernia content protrusion.  It is tender to reduction.  He has been trying to lose weight in the last few months but it has been very difficult with the pain and umbilical area.  Denies abdominal distention nausea or vomiting.  Denies any fever abdominal surgery.   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 Tolerating anticoagulation If pacer download shows continued paroxysmal atrial fibrillation, could increase diltiazem, hold the amlodipine. He started  the latter on his own      Productive cough 10/31/2016   Chronic diastolic heart failure (CMS-HCC) 08/11/2015   Overview    Last Assessment & Plan:  Currently taking Lasix 20 minute grams twice a day BUN borderline elevated. Will monitor for now, overall he feels well      Hypertension Unknown   Hyperlipidemia Unknown   Mitral valve regurgitation 06/01/2015   Overview    Last Assessment & Plan:  Mild to moderate MR on prior echocardiogram Asymptomatic      IBS (irritable bowel syndrome) Unknown   GERD (gastroesophageal reflux disease) 06/04/2014   Adenomatous polyps Unknown   CAD (coronary artery disease) 04/18/2013   Overview    Last Assessment & Plan:  Currently with no symptoms of angina. No further workup at this time. Continue current medication regimen.      Shortness of breath 03/15/2013   Overview    Last Assessment & Plan:  He reports that some of his shortness of breath symptoms have improved without losartan. Echocardiogram to evaluate his murmur and valves. Blood pressure is high which could contribute to shortness of breath.         GENERAL REVIEW OF SYSTEMS:   General ROS: negative for - chills, fatigue, fever.  Positive for weight gain Allergy and Immunology ROS: negative for - hives  Hematological and Lymphatic ROS: negative for - bleeding problems or bruising, negative for palpable nodes Endocrine ROS: negative for - heat or cold intolerance, hair changes  Respiratory ROS: negative for - cough, shortness of breath or wheezing Cardiovascular ROS: no chest pain or palpitations.  Positive for leg swelling GI ROS: negative for nausea, vomiting, abdominal pain, diarrhea, positive for constipation Musculoskeletal ROS: Positive for - joint swelling or muscle pain Neurological ROS: negative for - confusion, syncope Dermatological ROS: negative for pruritus and rash Psychiatric: negative for anxiety, depression, difficulty sleeping and  memory loss  MEDICATIONS: Current Medications        Current Outpatient Medications  Medication Sig Dispense Refill  . acetaminophen (TYLENOL) 500 MG tablet Take 500 mg by mouth once daily as needed       . allopurinoL (ZYLOPRIM) 100 MG tablet 1 tab daily (along with 300 mg daily), 90 days 90 tablet 4  . allopurinoL (ZYLOPRIM) 300 MG tablet 1 tab daily, 90 days 90 tablet 4  . bisacodyL (DULCOLAX) 5 mg EC tablet Take 1-2 tablets by mouth twice per day. 360 tablet 3  . C,E,zinc,copper 11/omega3s/lut (OCUVITE ADULT 50 PLUS ORAL) Take 1 tablet by mouth once daily       . carboxymethylce-glycern-poly80 (REFRESH OPTIVE ADVANCED) 0.5-1-0.5 % Drop Apply 1 drop to eye once daily    . carvedilol (COREG) 12.5 MG tablet Take 6.5 mg by mouth 2 (two) times daily with meals       . desloratadine (CLARINEX) 5 mg tablet TAKE 1 TABLET BY MOUTH EVERY DAY 90 tablet 1  . ELIQUIS 5 mg tablet Take 1 tablet by mouth 2 (two) times daily.  6  . flecainide (TAMBOCOR) 100 MG tablet Take 100 mg by mouth 2 (two) times daily    . fluticasone (FLONASE) 50 mcg/actuation nasal spray SPRAY 2 SPRAYS INTO EACH NOSTRIL EVERY DAY 16 g 2  . lansoprazole (PREVACID) 15 MG DR capsule Take 15 mg by mouth continuously as needed.    . magnesium oxide (MAG-OX) 400 mg (241.3 mg magnesium) tablet Take 400 mg by mouth once daily       . meloxicam (MOBIC) 15 MG tablet TAKE 1 TABLET BY MOUTH EVERY DAY 90 tablet 1  . metOLazone (ZAROXOLYN) 2.5 MG tablet Take 2.5 mg by mouth as needed    . polyethylene glycol (MIRALAX) packet Take 17 g by mouth once daily. Mix in 4-8ounces of fluid prior to taking.    . potassium chloride (KLOR-CON) 10 MEQ ER tablet Take 30 mEq by mouth once daily Taking 3 tablets twice daily      . pramipexole (MIRAPEX) 0.5 MG tablet Take 1 tablet (0.5 mg total) by mouth 3 (three) times daily 270 tablet 1  . red yeast rice 600 mg Tab Take 1 tablet by mouth once daily as needed    . selegiline  (ELDEPRYL) 5 mg capsule Take 1 capsule (5 mg total) by mouth 2 (two) times daily with meals Take with breakfast and lunch. 180 capsule 1  . sennosides-docusate (SENOKOT-S) 8.6-50 mg tablet Take 2 tablets by mouth 2 (two) times daily 360 tablet 3  . TORsemide (DEMADEX) 20 MG tablet Take 60 mg by mouth once daily        No current facility-administered medications for this visit.       ALLERGIES: Linaclotide, Codeine, Oxycodone, Terazosin, Gabapentin, Hydrocodone-acetaminophen, Lubiprostone, Omeprazole, and Requip [ropinirole]  PAST MEDICAL HISTORY:     Past Medical History:  Diagnosis Date  . Adenomatous polyps   . Arrhythmia 2018  . Arthritis 2017  . CHF (congestive heart failure) (CMS-HCC) 2018  . Coronary artery disease   . Coronary artery  disease   . Gastroesophageal reflux   . Heart murmur, unspecified   . Hyperlipidemia   . Hypertension   . IBS (irritable bowel syndrome)   . Other constipation 04/29/2019  . Parkinson disease (CMS-HCC)   . Stage III chronic kidney disease 04/16/2018    PAST SURGICAL HISTORY:      Past Surgical History:  Procedure Laterality Date  . APPENDECTOMY    . APPENDECTOMY  2013  . cardiac catheterization    . CARDIOVERSION EXTERNAL    . COLONOSCOPY  08/17/2011   Adenomatous Polyps, [facility] (Father): CBF 08/2014; Pt requests to wait until 01/2015; recall ltr mailed 11/24/2014 (dw)  . COLONOSCOPY W/BIOPSY N/A 11/10/2016   Procedure: COLONOSCOPY, FLEXIBLE; WITH BIOPSY, SINGLE OR MULTIPLE;  Surgeon: Narda Bonds, MD;  Location: [facility];  Service: Gastroenterology;  Laterality: N/A;  . COLONOSCOPY W/REMOVAL LESIONS BY SNARE N/A 11/10/2016   Procedure: COLONOSCOPY, FLEXIBLE; WITH REMOVAL OF TUMOR(S), POLYP(S), OR OTHER LESION(S) BY SNARE TECHNIQUE;  Surgeon: Narda Bonds, MD;  Location: [facility];  Service: Gastroenterology;  Laterality: N/A;  . EGD  05/06/2011   No repeat per RTE  . ENDOSCOPIC  CARPAL TUNNEL RELEASE Bilateral   . INSERT / REPLACE / REMOVE PACEMAKER    . Left carpal tunnel release  05/22/2019   Dr Marry Guan  . Right carpal tunnel release  10/22/2018   Dr Marry Guan  . TRANSVENOUS INSERTION PACEMAKER DUAL CHAMBER LEADS       FAMILY HISTORY:      Family History  Problem Relation Age of Onset  . Myocardial Infarction (Heart attack) Mother   . Heart disease Mother   . Coronary Artery Disease (Blocked arteries around heart) Mother   . Myocardial Infarction (Heart attack) Father   . Heart disease Father   . Colon polyps Father 47  . Colon cancer Father   . Stroke Sister   . Stroke Sister      SOCIAL HISTORY: Social History          Socioeconomic History  . Marital status: Married    Spouse name: Lamorris Knoblock  . Number of children: 1  . Years of education: 3  . Highest education level: Associate degree: academic program  Occupational History  . 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 Financial resource strain: Not on file  . Food insecurity    Worry: Not on file    Inability: Not on file  . Transportation needs    Medical: Not on file    Non-medical: Not on file  Tobacco Use  . Smoking status: Never Smoker  . Smokeless tobacco: Never Used  Substance and Sexual Activity  . Alcohol use: No  . Drug use: No  . Sexual activity: Yes    Partners: Female    Birth control/protection: None  Other Topics Concern  . Not on file  Social History Narrative  . Not on file      PHYSICAL EXAM:    Vitals:   10/15/19 1351  BP: 153/69  Pulse: 72   Body mass index is 37.07 kg/m. Weight: (!) 127.5 kg (281 lb)   GENERAL: Alert, active, oriented x3  HEENT: Pupils equal reactive to light. Extraocular movements are intact. Sclera clear. Palpebral conjunctiva normal red color.Pharynx clear.  NECK: Supple with no palpable mass and no adenopathy.  LUNGS: Sound clear with  no rales rhonchi or wheezes.  HEART: Regular rhythm S1 and S2 without murmur.  ABDOMEN: Soft and depressible, nontender with no palpable mass, no hepatomegaly.  Umbilical hernia, reducible, mild to moderate tender to reduction.  EXTREMITIES: Well-developed well-nourished symmetrical with no dependent edema.  NEUROLOGICAL: Awake alert oriented, facial expression symmetrical, moving all extremities.  REVIEW OF DATA: I have reviewed the following data today:      Appointment on 09/16/2019  Component Date Value  . Glucose 09/16/2019 110   . Sodium 09/16/2019 145   . Potassium 09/16/2019 4.4   . Chloride 09/16/2019 106   . Carbon Dioxide (CO2) 09/16/2019 34.4*  . Urea Nitrogen (BUN) 09/16/2019 37*  . Creatinine 09/16/2019 1.5*  . Glomerular Filtration Ra* 09/16/2019 46*  . Calcium 09/16/2019 9.4   . AST  09/16/2019 18   . ALT  09/16/2019 18   . Alk Phos (alkaline Phosp* 09/16/2019 98   . Albumin 09/16/2019 4.2   . Bilirubin, Total 09/16/2019 0.6   . Protein, Total 09/16/2019 6.5   . A/G Ratio 09/16/2019 1.8   . WBC (White Blood Cell Co* 09/16/2019 7.8   . RBC (Red Blood Cell Coun* 09/16/2019 4.77   . Hemoglobin 09/16/2019 13.8*  . Hematocrit 09/16/2019 44.4   . MCV (Mean Corpuscular Vo* 09/16/2019 93.1   . MCH (Mean Corpuscular He* 09/16/2019 28.9   . MCHC (Mean Corpuscular H* 09/16/2019 31.1*  . Platelet Count 09/16/2019 249   . RDW-CV (Red Cell Distrib* 09/16/2019 15.8*  . MPV (Mean Platelet Volum* 09/16/2019 10.0   . Neutrophils 09/16/2019 5.69   . Lymphocytes 09/16/2019 1.02   . Monocytes 09/16/2019 0.74   . Eosinophils 09/16/2019 0.24   . Basophils 09/16/2019 0.04   . Neutrophil % 09/16/2019 73.4*  . Lymphocyte % 09/16/2019 13.2   . Monocyte % 09/16/2019 9.5   . Eosinophil % 09/16/2019 3.1   . Basophil% 09/16/2019 0.5   . Immature Granulocyte % 09/16/2019 0.3   . Immature Granulocyte Cou* 09/16/2019 0.02   . Hemoglobin A1C 09/16/2019 6.3*  . Average Blood  Glucose (C* 09/16/2019 134      ASSESSMENT: Mr. Laws is a 74 y.o. male presenting for consultation for umbilical hernia.    Patient with a moderate sized umbilical hernia.  This is symptomatic with significant pain on the umbilical area.  Patient was oriented about the diagnosis of umbilical hernia and its implication.  Patient today pain 6 out of 10 on umbilical area.  We discussed about the treatment of tardive that includes observation versus surgical management.  Due to the significant pain on the area surgery is warranted.  I discussed with the patient the benefit of the surgery.  I also discussed with the patient the risk of surgery that includes intestinal injury, infection, bleeding, adhesions, bowel obstruction, intestinal fistula, among others.  Due to patient medical history that includes atrial fibrillation s/p cardioversion, chronic anticoagulation, Parkinson disease patient will need medical clearance and cardiology clearance.  Patient has had surgery during the last year including back and hand surgery where he hold Eliquis for those procedures.  Will contact cardiology for recommendation of perioperative management of anticoagulation.  Umbilical hernia without obstruction and without gangrene [K42.9]  PLAN: 1. Robotic assisted laparoscopic umbilical hernia repair with mesh (81448) 2. CBC, CMP done 09/16/2019 3. Cardiology clearance to stop Eliquis before surgery 4. Internal medicine clearance (Dr. Ola Spurr) 5. Contact us if has any question or concern  Patient and his wife verbalized understanding, all questions were answered, and were agreeable with the plan outlined above.     Herbert Pun,  MD  Electronically signed by Herbert Pun, MD

## 2019-10-15 NOTE — H&P (View-Only) (Signed)
PATIENT PROFILE: Matthew Howe is a 74 y.o. male who presents to the Clinic for consultation at the request of Dr. Ola Spurr for evaluation of umbilical hernia.  PCP:  Angelena Form, MD  HISTORY OF PRESENT ILLNESS: Matthew Howe reports having an umbilical hernia since at least 6 months ago.  He reported that he has been aggravating in the last few months.  The area is getting more painful with pressure.  The pain does not radiate to other part of the body.  The pain is aggravated by hernia content protrusion.  It is tender to reduction.  He has been trying to lose weight in the last few months but it has been very difficult with the pain and umbilical area.  Denies abdominal distention nausea or vomiting.  Denies any fever abdominal surgery.   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 Tolerating anticoagulation If pacer download shows continued paroxysmal atrial fibrillation, could increase diltiazem, hold the amlodipine. He started  the latter on his own      Productive cough 10/31/2016   Chronic diastolic heart failure (CMS-HCC) 08/11/2015   Overview    Last Assessment & Plan:  Currently taking Lasix 20 minute grams twice a day BUN borderline elevated. Will monitor for now, overall he feels well      Hypertension Unknown   Hyperlipidemia Unknown   Mitral valve regurgitation 06/01/2015   Overview    Last Assessment & Plan:  Mild to moderate MR on prior echocardiogram Asymptomatic      IBS (irritable bowel syndrome) Unknown   GERD (gastroesophageal reflux disease) 06/04/2014   Adenomatous polyps Unknown   CAD (coronary artery disease) 04/18/2013   Overview    Last Assessment & Plan:  Currently with no symptoms of angina. No further workup at this time. Continue current medication regimen.      Shortness of breath 03/15/2013   Overview    Last Assessment & Plan:  He reports that some of his shortness of breath symptoms have improved without losartan. Echocardiogram to evaluate his murmur and valves. Blood pressure is high which could contribute to shortness of breath.         GENERAL REVIEW OF SYSTEMS:   General ROS: negative for - chills, fatigue, fever.  Positive for weight gain Allergy and Immunology ROS: negative for - hives  Hematological and Lymphatic ROS: negative for - bleeding problems or bruising, negative for palpable nodes Endocrine ROS: negative for - heat or cold intolerance, hair changes  Respiratory ROS: negative for - cough, shortness of breath or wheezing Cardiovascular ROS: no chest pain or palpitations.  Positive for leg swelling GI ROS: negative for nausea, vomiting, abdominal pain, diarrhea, positive for constipation Musculoskeletal ROS: Positive for - joint swelling or muscle pain Neurological ROS: negative for - confusion, syncope Dermatological ROS: negative for pruritus and rash Psychiatric: negative for anxiety, depression, difficulty sleeping and  memory loss  MEDICATIONS: Current Medications        Current Outpatient Medications  Medication Sig Dispense Refill  . acetaminophen (TYLENOL) 500 MG tablet Take 500 mg by mouth once daily as needed       . allopurinoL (ZYLOPRIM) 100 MG tablet 1 tab daily (along with 300 mg daily), 90 days 90 tablet 4  . allopurinoL (ZYLOPRIM) 300 MG tablet 1 tab daily, 90 days 90 tablet 4  . bisacodyL (DULCOLAX) 5 mg EC tablet Take 1-2 tablets by mouth twice per day. 360 tablet 3  . C,E,zinc,copper 11/omega3s/lut (OCUVITE ADULT 50 PLUS ORAL) Take 1 tablet by mouth once daily       . carboxymethylce-glycern-poly80 (REFRESH OPTIVE ADVANCED) 0.5-1-0.5 % Drop Apply 1 drop to eye once daily    . carvedilol (COREG) 12.5 MG tablet Take 6.5 mg by mouth 2 (two) times daily with meals       . desloratadine (CLARINEX) 5 mg tablet TAKE 1 TABLET BY MOUTH EVERY DAY 90 tablet 1  . ELIQUIS 5 mg tablet Take 1 tablet by mouth 2 (two) times daily.  6  . flecainide (TAMBOCOR) 100 MG tablet Take 100 mg by mouth 2 (two) times daily    . fluticasone (FLONASE) 50 mcg/actuation nasal spray SPRAY 2 SPRAYS INTO EACH NOSTRIL EVERY DAY 16 g 2  . lansoprazole (PREVACID) 15 MG DR capsule Take 15 mg by mouth continuously as needed.    . magnesium oxide (MAG-OX) 400 mg (241.3 mg magnesium) tablet Take 400 mg by mouth once daily       . meloxicam (MOBIC) 15 MG tablet TAKE 1 TABLET BY MOUTH EVERY DAY 90 tablet 1  . metOLazone (ZAROXOLYN) 2.5 MG tablet Take 2.5 mg by mouth as needed    . polyethylene glycol (MIRALAX) packet Take 17 g by mouth once daily. Mix in 4-8ounces of fluid prior to taking.    . potassium chloride (KLOR-CON) 10 MEQ ER tablet Take 30 mEq by mouth once daily Taking 3 tablets twice daily      . pramipexole (MIRAPEX) 0.5 MG tablet Take 1 tablet (0.5 mg total) by mouth 3 (three) times daily 270 tablet 1  . red yeast rice 600 mg Tab Take 1 tablet by mouth once daily as needed    . selegiline  (ELDEPRYL) 5 mg capsule Take 1 capsule (5 mg total) by mouth 2 (two) times daily with meals Take with breakfast and lunch. 180 capsule 1  . sennosides-docusate (SENOKOT-S) 8.6-50 mg tablet Take 2 tablets by mouth 2 (two) times daily 360 tablet 3  . TORsemide (DEMADEX) 20 MG tablet Take 60 mg by mouth once daily        No current facility-administered medications for this visit.       ALLERGIES: Linaclotide, Codeine, Oxycodone, Terazosin, Gabapentin, Hydrocodone-acetaminophen, Lubiprostone, Omeprazole, and Requip [ropinirole]  PAST MEDICAL HISTORY:     Past Medical History:  Diagnosis Date  . Adenomatous polyps   . Arrhythmia 2018  . Arthritis 2017  . CHF (congestive heart failure) (CMS-HCC) 2018  . Coronary artery disease   . Coronary artery  disease   . Gastroesophageal reflux   . Heart murmur, unspecified   . Hyperlipidemia   . Hypertension   . IBS (irritable bowel syndrome)   . Other constipation 04/29/2019  . Parkinson disease (CMS-HCC)   . Stage III chronic kidney disease 04/16/2018    PAST SURGICAL HISTORY:      Past Surgical History:  Procedure Laterality Date  . APPENDECTOMY    . APPENDECTOMY  2013  . cardiac catheterization    . CARDIOVERSION EXTERNAL    . COLONOSCOPY  08/17/2011   Adenomatous Polyps, [facility] (Father): CBF 08/2014; Pt requests to wait until 01/2015; recall ltr mailed 11/24/2014 (dw)  . COLONOSCOPY W/BIOPSY N/A 11/10/2016   Procedure: COLONOSCOPY, FLEXIBLE; WITH BIOPSY, SINGLE OR MULTIPLE;  Surgeon: Narda Bonds, MD;  Location: [facility];  Service: Gastroenterology;  Laterality: N/A;  . COLONOSCOPY W/REMOVAL LESIONS BY SNARE N/A 11/10/2016   Procedure: COLONOSCOPY, FLEXIBLE; WITH REMOVAL OF TUMOR(S), POLYP(S), OR OTHER LESION(S) BY SNARE TECHNIQUE;  Surgeon: Narda Bonds, MD;  Location: [facility];  Service: Gastroenterology;  Laterality: N/A;  . EGD  05/06/2011   No repeat per RTE  . ENDOSCOPIC  CARPAL TUNNEL RELEASE Bilateral   . INSERT / REPLACE / REMOVE PACEMAKER    . Left carpal tunnel release  05/22/2019   Dr Marry Guan  . Right carpal tunnel release  10/22/2018   Dr Marry Guan  . TRANSVENOUS INSERTION PACEMAKER DUAL CHAMBER LEADS       FAMILY HISTORY:      Family History  Problem Relation Age of Onset  . Myocardial Infarction (Heart attack) Mother   . Heart disease Mother   . Coronary Artery Disease (Blocked arteries around heart) Mother   . Myocardial Infarction (Heart attack) Father   . Heart disease Father   . Colon polyps Father 58  . Colon cancer Father   . Stroke Sister   . Stroke Sister      SOCIAL HISTORY: Social History          Socioeconomic History  . Marital status: Married    Spouse name: Zacheriah Stumpe  . Number of children: 1  . Years of education: 63  . Highest education level: Associate degree: academic program  Occupational History  . 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 Financial resource strain: Not on file  . Food insecurity    Worry: Not on file    Inability: Not on file  . Transportation needs    Medical: Not on file    Non-medical: Not on file  Tobacco Use  . Smoking status: Never Smoker  . Smokeless tobacco: Never Used  Substance and Sexual Activity  . Alcohol use: No  . Drug use: No  . Sexual activity: Yes    Partners: Female    Birth control/protection: None  Other Topics Concern  . Not on file  Social History Narrative  . Not on file      PHYSICAL EXAM:    Vitals:   10/15/19 1351  BP: 153/69  Pulse: 72   Body mass index is 37.07 kg/m. Weight: (!) 127.5 kg (281 lb)   GENERAL: Alert, active, oriented x3  HEENT: Pupils equal reactive to light. Extraocular movements are intact. Sclera clear. Palpebral conjunctiva normal red color.Pharynx clear.  NECK: Supple with no palpable mass and no adenopathy.  LUNGS: Sound clear with  no rales rhonchi or wheezes.  HEART: Regular rhythm S1 and S2 without murmur.  ABDOMEN: Soft and depressible, nontender with no palpable mass, no hepatomegaly.  Umbilical hernia, reducible, mild to moderate tender to reduction.  EXTREMITIES: Well-developed well-nourished symmetrical with no dependent edema.  NEUROLOGICAL: Awake alert oriented, facial expression symmetrical, moving all extremities.  REVIEW OF DATA: I have reviewed the following data today:      Appointment on 09/16/2019  Component Date Value  . Glucose 09/16/2019 110   . Sodium 09/16/2019 145   . Potassium 09/16/2019 4.4   . Chloride 09/16/2019 106   . Carbon Dioxide (CO2) 09/16/2019 34.4*  . Urea Nitrogen (BUN) 09/16/2019 37*  . Creatinine 09/16/2019 1.5*  . Glomerular Filtration Ra* 09/16/2019 46*  . Calcium 09/16/2019 9.4   . AST  09/16/2019 18   . ALT  09/16/2019 18   . Alk Phos (alkaline Phosp* 09/16/2019 98   . Albumin 09/16/2019 4.2   . Bilirubin, Total 09/16/2019 0.6   . Protein, Total 09/16/2019 6.5   . A/G Ratio 09/16/2019 1.8   . WBC (White Blood Cell Co* 09/16/2019 7.8   . RBC (Red Blood Cell Coun* 09/16/2019 4.77   . Hemoglobin 09/16/2019 13.8*  . Hematocrit 09/16/2019 44.4   . MCV (Mean Corpuscular Vo* 09/16/2019 93.1   . MCH (Mean Corpuscular He* 09/16/2019 28.9   . MCHC (Mean Corpuscular H* 09/16/2019 31.1*  . Platelet Count 09/16/2019 249   . RDW-CV (Red Cell Distrib* 09/16/2019 15.8*  . MPV (Mean Platelet Volum* 09/16/2019 10.0   . Neutrophils 09/16/2019 5.69   . Lymphocytes 09/16/2019 1.02   . Monocytes 09/16/2019 0.74   . Eosinophils 09/16/2019 0.24   . Basophils 09/16/2019 0.04   . Neutrophil % 09/16/2019 73.4*  . Lymphocyte % 09/16/2019 13.2   . Monocyte % 09/16/2019 9.5   . Eosinophil % 09/16/2019 3.1   . Basophil% 09/16/2019 0.5   . Immature Granulocyte % 09/16/2019 0.3   . Immature Granulocyte Cou* 09/16/2019 0.02   . Hemoglobin A1C 09/16/2019 6.3*  . Average Blood  Glucose (C* 09/16/2019 134      ASSESSMENT: Matthew Howe is a 74 y.o. male presenting for consultation for umbilical hernia.    Patient with a moderate sized umbilical hernia.  This is symptomatic with significant pain on the umbilical area.  Patient was oriented about the diagnosis of umbilical hernia and its implication.  Patient today pain 6 out of 10 on umbilical area.  We discussed about the treatment of tardive that includes observation versus surgical management.  Due to the significant pain on the area surgery is warranted.  I discussed with the patient the benefit of the surgery.  I also discussed with the patient the risk of surgery that includes intestinal injury, infection, bleeding, adhesions, bowel obstruction, intestinal fistula, among others.  Due to patient medical history that includes atrial fibrillation s/p cardioversion, chronic anticoagulation, Parkinson disease patient will need medical clearance and cardiology clearance.  Patient has had surgery during the last year including back and hand surgery where he hold Eliquis for those procedures.  Will contact cardiology for recommendation of perioperative management of anticoagulation.  Umbilical hernia without obstruction and without gangrene [K42.9]  PLAN: 1. Robotic assisted laparoscopic umbilical hernia repair with mesh (09983) 2. CBC, CMP done 09/16/2019 3. Cardiology clearance to stop Eliquis before surgery 4. Internal medicine clearance (Dr. Ola Spurr) 5. Contact us if has any question or concern  Patient and his wife verbalized understanding, all questions were answered, and were agreeable with the plan outlined above.     Herbert Pun,  MD  Electronically signed by Herbert Pun, MD

## 2019-10-18 ENCOUNTER — Encounter
Admission: RE | Admit: 2019-10-18 | Discharge: 2019-10-18 | Disposition: A | Discharge status: Home / Self Care | Payer: Medicare Other | Source: Ambulatory Visit | Attending: General Surgery | Admitting: General Surgery

## 2019-10-18 ENCOUNTER — Telehealth: Payer: Self-pay | Admitting: *Deleted

## 2019-10-18 ENCOUNTER — Other Ambulatory Visit: Payer: Self-pay

## 2019-10-18 ENCOUNTER — Other Ambulatory Visit: Payer: Self-pay | Admitting: Cardiovascular Disease

## 2019-10-18 NOTE — Telephone Encounter (Signed)
   [facility] Pre-operative Risk Assessment    Request for surgical clearance:  1. What type of surgery is being performed? UMBILICAL HERNIA REPAIR   2. When is this surgery scheduled? 08/22/20   3. What type of clearance is required (medical clearance vs. Pharmacy clearance to hold med vs. Both)? BOTH  4. Are there any medications that need to be held prior to surgery and how long? ELIQUIS   5. Practice name and name of physician performing surgery? [facility]   6. What is your office phone number [phone]    7.   What is your office fax number [phone]  8.   Anesthesia type (None, local, MAC, general) ? NOT LISTED   Matthew Howe 10/18/2019, 5:16 PM  _________________________________________________________________   (provider comments below)

## 2019-10-18 NOTE — Telephone Encounter (Signed)
Eliquis 5mg  refill request received, pt is 74 yrs old, weight-124.9kg, Crea-1.53 on 08/27/2019, Diagnosis-Afib, and last seen by Dr. Caryl Comes on 07/23/2019. Dose is appropriate based on dosing criteria. Will send in refill to requested pharmacy.

## 2019-10-18 NOTE — Patient Instructions (Signed)
Your procedure is scheduled on: 10/23/19 Report to [facility]. To find out your arrival time please call [phone] between 1PM - 3PM on 10/22/19.  Remember: Instructions that are not followed completely may result in serious medical risk, up to and including death, or upon the discretion of your surgeon and anesthesiologist your surgery may need to be rescheduled.     _X__ 1. Do not eat food after midnight the night before your procedure.                 No gum chewing or hard candies. You may drink clear liquids up to 2 hours                 before you are scheduled to arrive for your surgery- DO not drink clear                 liquids within 2 hours of the start of your surgery.                 Clear Liquids include:  water, apple juice without pulp, clear carbohydrate                 drink such as Clearfast or Gatorade, Black Coffee or Tea (Do not add                 anything to coffee or tea). Diabetics water only  __X__2.  On the morning of surgery brush your teeth with toothpaste and water, you                 may rinse your mouth with mouthwash if you wish.  Do not swallow any              toothpaste of mouthwash.     _X__ 3.  No Alcohol for 24 hours before or after surgery.   _X__ 4.  Do Not Smoke or use e-cigarettes For 24 Hours Prior to Your Surgery.                 Do not use any chewable tobacco products for at least 6 hours prior to                 surgery.  ____  5.  Bring all medications with you on the day of surgery if instructed.   __X__  6.  Notify your doctor if there is any change in your medical condition      (cold, fever, infections).     Do not wear jewelry, make-up, hairpins, clips or nail polish. Do not wear lotions, powders, or perfumes.  Do not shave 48 hours prior to surgery. Men may shave face and neck. Do not bring valuables to the hospital.    [facility] is not responsible for any belongings or  valuables.  Contacts, dentures/partials or body piercings may not be worn into surgery. Bring a case for your contacts, glasses or hearing aids, a denture cup will be supplied. Leave your suitcase in the car. After surgery it may be brought to your room. For patients admitted to the hospital, discharge time is determined by your treatment team.   Patients discharged the day of surgery will not be allowed to drive home.   Please read over the following fact sheets that you were given:   MRSA Information  __X__ Take these medicines the morning of surgery with A SIP OF WATER:  1. Allopurinol  2. Carvedilol  3. Prevacid  4. Selegiline  5. Pramipexole  6.  ____ Fleet Enema (as directed)   __X__ Use CHG Soap/SAGE wipes as directed  ____ Use inhalers on the day of surgery  ____ Stop metformin/Janumet/Farxiga 2 days prior to surgery    ____ Take 1/2 of usual insulin dose the night before surgery. No insulin the morning          of surgery.   __X__ Stop Blood Thinners Coumadin/Plavix/Xarelto/Pleta/Pradaxa/Eliquis/Effient/Aspirin AS INSTRUCTED   Or contact your Surgeon, Cardiologist or Medical Doctor regarding  ability to stop your blood thinners  __X__ Stop Anti-inflammatories 7 days before surgery such as Advil, Ibuprofen, Motrin,  BC or Goodies Powder, Naprosyn, Naproxen, Aleve, Aspirin    __X__ Stop all herbal supplements, fish oil or vitamin E until after surgery.    ____ Bring C-Pap to the hospital.     If Pacemaker Card, Living Will and [place] not brought day of Covid screening, please bring day of surgery.

## 2019-10-18 NOTE — Telephone Encounter (Signed)
Refill request

## 2019-10-21 ENCOUNTER — Other Ambulatory Visit
Admission: RE | Admit: 2019-10-21 | Discharge: 2019-10-21 | Disposition: A | Discharge status: Home / Self Care | Payer: Medicare Other | Source: Ambulatory Visit | Attending: General Surgery | Admitting: General Surgery

## 2019-10-21 ENCOUNTER — Other Ambulatory Visit: Payer: Self-pay

## 2019-10-21 DIAGNOSIS — Z20822 Contact with and (suspected) exposure to covid-19: Secondary | ICD-10-CM | POA: Diagnosis not present

## 2019-10-21 DIAGNOSIS — Z01812 Encounter for preprocedural laboratory examination: Secondary | ICD-10-CM | POA: Insufficient documentation

## 2019-10-21 LAB — SARS CORONAVIRUS 2 (TAT 6-24 HRS): SARS Coronavirus 2: NEGATIVE

## 2019-10-21 NOTE — Telephone Encounter (Signed)
Patient with diagnosis of afib on Eliquis for anticoagulation.    Procedure: UMBILICAL HERNIA REPAIR  Date of procedure: 10/23/2019  CHADS2-VASc score of  4 (CHF, HTN, AGE, CAD)  CrCl 60 ml/min  Per office protocol, patient can hold Eliquis for 2 days prior to procedure.

## 2019-10-21 NOTE — Telephone Encounter (Signed)
   Primary Cardiologist: Ida Rogue, MD  Chart reviewed as part of pre-operative protocol coverage. Patient last seen by Dr. Caryl Comes in 07/23/2019 at which time he was a little volume overloaded but was otherwise doing well from a cardiac standpoint. Home Torsemide dosing was adjusted. Called and spoke with patient today and he has been doing well from a cardiac standpoing since visit with Dr. Deetta Perla. Increase in home Torsemide has helped and he has no acute CHF symptoms. No chest pain. Able to complete > 4.0 mets. Given past medical history and time since last visit, based on ACC/AHA guidelines, Matthew Howe would be at acceptable risk for the planned procedure without further cardiovascular testing.   Per Pharmacy and our office protocol, "patient can hold Eliquis for 2 days prior to procedure." Patient states he has been holding Eliquis since 10/17/2019 at the direction of the surgeon's office. We are now 2 days out from surgery so will not restart at this time but recommend patient restart Eliquis as soon as he is able following surgery.   Given patient's history of atrial fibrillation, recommend monitoring patient on telemetry in peri-operative period.   I will route this recommendation to the requesting party via Epic fax function and remove from pre-op pool.  Please call with questions.  Darreld Mclean, PA-C 10/21/2019, 8:53 AM

## 2019-10-22 MED ORDER — CEFAZOLIN SODIUM-DEXTROSE 2-4 GM/100ML-% IV SOLN
2.0000 g | INTRAVENOUS | Status: AC
  Administered 2019-10-23: 08:00:00 2 g via INTRAVENOUS

## 2019-10-23 ENCOUNTER — Other Ambulatory Visit: Payer: Self-pay

## 2019-10-23 ENCOUNTER — Ambulatory Visit: Payer: Medicare Other | Admitting: Family

## 2019-10-23 ENCOUNTER — Encounter: Payer: Self-pay | Admitting: General Surgery

## 2019-10-23 ENCOUNTER — Encounter
Admission: RE | Disposition: A | Discharge status: Home / Self Care | Payer: Self-pay | Source: Home / Self Care | Attending: General Surgery

## 2019-10-23 ENCOUNTER — Ambulatory Visit
Admission: RE | Admit: 2019-10-23 | Discharge: 2019-10-23 | Disposition: A | Discharge status: Home / Self Care | Payer: Medicare Other | Attending: General Surgery | Admitting: General Surgery

## 2019-10-23 DIAGNOSIS — K219 Gastro-esophageal reflux disease without esophagitis: Secondary | ICD-10-CM | POA: Insufficient documentation

## 2019-10-23 DIAGNOSIS — K589 Irritable bowel syndrome without diarrhea: Secondary | ICD-10-CM | POA: Diagnosis not present

## 2019-10-23 DIAGNOSIS — G20 Parkinson's disease: Secondary | ICD-10-CM | POA: Diagnosis not present

## 2019-10-23 DIAGNOSIS — Z885 Allergy status to narcotic agent status: Secondary | ICD-10-CM | POA: Insufficient documentation

## 2019-10-23 DIAGNOSIS — I5032 Chronic diastolic (congestive) heart failure: Secondary | ICD-10-CM | POA: Insufficient documentation

## 2019-10-23 DIAGNOSIS — N183 Chronic kidney disease, stage 3 unspecified: Secondary | ICD-10-CM | POA: Insufficient documentation

## 2019-10-23 DIAGNOSIS — I480 Paroxysmal atrial fibrillation: Secondary | ICD-10-CM | POA: Diagnosis not present

## 2019-10-23 DIAGNOSIS — Z888 Allergy status to other drugs, medicaments and biological substances status: Secondary | ICD-10-CM | POA: Diagnosis not present

## 2019-10-23 DIAGNOSIS — I340 Nonrheumatic mitral (valve) insufficiency: Secondary | ICD-10-CM | POA: Insufficient documentation

## 2019-10-23 DIAGNOSIS — R7303 Prediabetes: Secondary | ICD-10-CM | POA: Diagnosis not present

## 2019-10-23 DIAGNOSIS — Z8249 Family history of ischemic heart disease and other diseases of the circulatory system: Secondary | ICD-10-CM | POA: Insufficient documentation

## 2019-10-23 DIAGNOSIS — Z950 Presence of cardiac pacemaker: Secondary | ICD-10-CM | POA: Insufficient documentation

## 2019-10-23 DIAGNOSIS — M1A9XX1 Chronic gout, unspecified, with tophus (tophi): Secondary | ICD-10-CM | POA: Insufficient documentation

## 2019-10-23 DIAGNOSIS — I442 Atrioventricular block, complete: Secondary | ICD-10-CM | POA: Insufficient documentation

## 2019-10-23 DIAGNOSIS — M1380 Other specified arthritis, unspecified site: Secondary | ICD-10-CM | POA: Diagnosis not present

## 2019-10-23 DIAGNOSIS — Z79899 Other long term (current) drug therapy: Secondary | ICD-10-CM | POA: Diagnosis not present

## 2019-10-23 DIAGNOSIS — K429 Umbilical hernia without obstruction or gangrene: Secondary | ICD-10-CM | POA: Diagnosis not present

## 2019-10-23 DIAGNOSIS — Z800 Family history of malignant neoplasm of digestive organs: Secondary | ICD-10-CM | POA: Insufficient documentation

## 2019-10-23 DIAGNOSIS — Z823 Family history of stroke: Secondary | ICD-10-CM | POA: Insufficient documentation

## 2019-10-23 DIAGNOSIS — I130 Hypertensive heart and chronic kidney disease with heart failure and stage 1 through stage 4 chronic kidney disease, or unspecified chronic kidney disease: Secondary | ICD-10-CM | POA: Diagnosis not present

## 2019-10-23 DIAGNOSIS — Z791 Long term (current) use of non-steroidal anti-inflammatories (NSAID): Secondary | ICD-10-CM | POA: Insufficient documentation

## 2019-10-23 DIAGNOSIS — E785 Hyperlipidemia, unspecified: Secondary | ICD-10-CM | POA: Diagnosis not present

## 2019-10-23 DIAGNOSIS — Z7901 Long term (current) use of anticoagulants: Secondary | ICD-10-CM | POA: Diagnosis not present

## 2019-10-23 DIAGNOSIS — K5900 Constipation, unspecified: Secondary | ICD-10-CM | POA: Insufficient documentation

## 2019-10-23 DIAGNOSIS — M4316 Spondylolisthesis, lumbar region: Secondary | ICD-10-CM | POA: Insufficient documentation

## 2019-10-23 DIAGNOSIS — I2510 Atherosclerotic heart disease of native coronary artery without angina pectoris: Secondary | ICD-10-CM | POA: Insufficient documentation

## 2019-10-23 HISTORY — PX: HERNIA REPAIR: SHX51

## 2019-10-23 SURGERY — REPAIR, HERNIA, UMBILICAL, ROBOT-ASSISTED
Anesthesia: General | Site: Abdomen

## 2019-10-23 MED ORDER — DEXAMETHASONE SODIUM PHOSPHATE 10 MG/ML IJ SOLN
INTRAMUSCULAR | Status: DC | PRN
  Administered 2019-10-23: 10 mg via INTRAVENOUS

## 2019-10-23 MED ORDER — SUGAMMADEX SODIUM 200 MG/2ML IV SOLN
INTRAVENOUS | Status: AC
  Filled 2019-10-23: qty 2

## 2019-10-23 MED ORDER — PHENYLEPHRINE HCL (PRESSORS) 10 MG/ML IV SOLN
INTRAVENOUS | Status: DC | PRN
  Administered 2019-10-23: 100 ug via INTRAVENOUS

## 2019-10-23 MED ORDER — PROPOFOL 10 MG/ML IV BOLUS
INTRAVENOUS | Status: AC
  Filled 2019-10-23: qty 40

## 2019-10-23 MED ORDER — FENTANYL CITRATE (PF) 100 MCG/2ML IJ SOLN
25.0000 ug | INTRAMUSCULAR | Status: DC | PRN
  Administered 2019-10-23 (×3): 25 ug via INTRAVENOUS

## 2019-10-23 MED ORDER — HYDROCODONE-ACETAMINOPHEN 5-325 MG PO TABS
1.0000 | ORAL_TABLET | ORAL | Status: DC | PRN
  Administered 2019-10-23: 1 via ORAL

## 2019-10-23 MED ORDER — PHENYLEPHRINE HCL-NACL 10-0.9 MG/250ML-% IV SOLN
INTRAVENOUS | Status: DC | PRN
  Administered 2019-10-23: 25 ug/min via INTRAVENOUS

## 2019-10-23 MED ORDER — DEXAMETHASONE SODIUM PHOSPHATE 10 MG/ML IJ SOLN
INTRAMUSCULAR | Status: AC
  Filled 2019-10-23: qty 1

## 2019-10-23 MED ORDER — ONDANSETRON HCL 4 MG/2ML IJ SOLN
INTRAMUSCULAR | Status: DC | PRN
  Administered 2019-10-23: 4 mg via INTRAVENOUS

## 2019-10-23 MED ORDER — ROCURONIUM BROMIDE 50 MG/5ML IV SOLN
INTRAVENOUS | Status: AC
  Filled 2019-10-23: qty 1

## 2019-10-23 MED ORDER — ONDANSETRON HCL 4 MG/2ML IJ SOLN
INTRAMUSCULAR | Status: AC
  Filled 2019-10-23: qty 2

## 2019-10-23 MED ORDER — HYDROCODONE-ACETAMINOPHEN 5-325 MG PO TABS: 1.0000 | ORAL_TABLET | ORAL | 0 refills | Status: AC | PRN

## 2019-10-23 MED ORDER — ROCURONIUM BROMIDE 100 MG/10ML IV SOLN
INTRAVENOUS | Status: DC | PRN
  Administered 2019-10-23: 10 mg via INTRAVENOUS

## 2019-10-23 MED ORDER — IPRATROPIUM-ALBUTEROL 0.5-2.5 (3) MG/3ML IN SOLN
3.0000 mL | Freq: Once | RESPIRATORY_TRACT | Status: AC
  Administered 2019-10-23: 3 mL via RESPIRATORY_TRACT

## 2019-10-23 MED ORDER — FENTANYL CITRATE (PF) 100 MCG/2ML IJ SOLN
INTRAMUSCULAR | Status: AC
  Filled 2019-10-23: qty 2

## 2019-10-23 MED ORDER — BUPIVACAINE HCL (PF) 0.25 % IJ SOLN
INTRAMUSCULAR | Status: AC
  Filled 2019-10-23: qty 30

## 2019-10-23 MED ORDER — LIDOCAINE HCL (PF) 2 % IJ SOLN
INTRAMUSCULAR | Status: AC
  Filled 2019-10-23: qty 10

## 2019-10-23 MED ORDER — EPINEPHRINE PF 1 MG/ML IJ SOLN
INTRAMUSCULAR | Status: AC
  Filled 2019-10-23: qty 1

## 2019-10-23 MED ORDER — LIDOCAINE HCL (CARDIAC) PF 100 MG/5ML IV SOSY
PREFILLED_SYRINGE | INTRAVENOUS | Status: DC | PRN
  Administered 2019-10-23: 100 mg via INTRAVENOUS

## 2019-10-23 MED ORDER — SUGAMMADEX SODIUM 200 MG/2ML IV SOLN
INTRAVENOUS | Status: DC | PRN
  Administered 2019-10-23: 200 mg via INTRAVENOUS

## 2019-10-23 MED ORDER — LACTATED RINGERS IV SOLN: INTRAVENOUS | Status: DC

## 2019-10-23 MED ORDER — HYDROCODONE-ACETAMINOPHEN 5-325 MG PO TABS
ORAL_TABLET | ORAL | Status: AC
  Filled 2019-10-23: qty 1

## 2019-10-23 MED ORDER — CEFAZOLIN SODIUM-DEXTROSE 2-4 GM/100ML-% IV SOLN
INTRAVENOUS | Status: AC
  Filled 2019-10-23: qty 100

## 2019-10-23 MED ORDER — SUCCINYLCHOLINE CHLORIDE 20 MG/ML IJ SOLN
INTRAMUSCULAR | Status: DC | PRN
  Administered 2019-10-23: 100 mg via INTRAVENOUS

## 2019-10-23 MED ORDER — IPRATROPIUM-ALBUTEROL 0.5-2.5 (3) MG/3ML IN SOLN
RESPIRATORY_TRACT | Status: AC
  Filled 2019-10-23: qty 3

## 2019-10-23 MED ORDER — FENTANYL CITRATE (PF) 100 MCG/2ML IJ SOLN
INTRAMUSCULAR | Status: DC | PRN
  Administered 2019-10-23 (×2): 50 ug via INTRAVENOUS

## 2019-10-23 MED ORDER — BUPIVACAINE-EPINEPHRINE (PF) 0.25% -1:200000 IJ SOLN
INTRAMUSCULAR | Status: DC | PRN
  Administered 2019-10-23: 30 mL

## 2019-10-23 MED ORDER — PROPOFOL 10 MG/ML IV BOLUS
INTRAVENOUS | Status: DC | PRN
  Administered 2019-10-23: 160 mg via INTRAVENOUS

## 2019-10-23 MED ORDER — ONDANSETRON HCL 4 MG/2ML IJ SOLN: 4.0000 mg | Freq: Once | INTRAMUSCULAR | Status: DC | PRN

## 2019-10-23 SURGICAL SUPPLY — 57 items
BLADE SURG SZ11 CARB STEEL (BLADE) ×3 IMPLANT
CANISTER SUCT 1200ML W/VALVE (MISCELLANEOUS) ×3 IMPLANT
CANNULA REDUC XI 12-8 STAPL (CANNULA) ×1
CANNULA REDUC XI 12-8MM STAPL (CANNULA) ×1
CANNULA REDUCER 12-8 DVNC XI (CANNULA) ×1 IMPLANT
CHLORAPREP W/TINT 26 (MISCELLANEOUS) ×3 IMPLANT
COVER TIP SHEARS 8 DVNC (MISCELLANEOUS) ×1 IMPLANT
COVER TIP SHEARS 8MM DA VINCI (MISCELLANEOUS) ×2
COVER WAND RF STERILE (DRAPES) ×3 IMPLANT
DEFOGGER SCOPE WARMER CLEARIFY (MISCELLANEOUS) ×3 IMPLANT
DERMABOND ADVANCED (GAUZE/BANDAGES/DRESSINGS) ×2
DERMABOND ADVANCED .7 DNX12 (GAUZE/BANDAGES/DRESSINGS) ×1 IMPLANT
DRAPE 3/4 80X56 (DRAPES) ×3 IMPLANT
DRAPE ARM DVNC X/XI (DISPOSABLE) ×4 IMPLANT
DRAPE COLUMN DVNC XI (DISPOSABLE) ×1 IMPLANT
DRAPE DA VINCI XI ARM (DISPOSABLE) ×8
DRAPE DA VINCI XI COLUMN (DISPOSABLE) ×2
ELECT CAUTERY BLADE 6.4 (BLADE) ×3 IMPLANT
ELECT REM PT RETURN 9FT ADLT (ELECTROSURGICAL) ×3
ELECTRODE REM PT RTRN 9FT ADLT (ELECTROSURGICAL) ×1 IMPLANT
ETHIBOND 2 0 GREEN CT 2 30IN (SUTURE) ×3 IMPLANT
GLOVE BIO SURGEON STRL SZ 6.5 (GLOVE) ×4 IMPLANT
GLOVE BIO SURGEONS STRL SZ 6.5 (GLOVE) ×2
GLOVE BIOGEL PI IND STRL 6.5 (GLOVE) ×2 IMPLANT
GLOVE BIOGEL PI INDICATOR 6.5 (GLOVE) ×4
GOWN STRL REUS W/ TWL LRG LVL3 (GOWN DISPOSABLE) ×3 IMPLANT
GOWN STRL REUS W/TWL LRG LVL3 (GOWN DISPOSABLE) ×6
GRASPER SUT TROCAR 14GX15 (MISCELLANEOUS) ×3 IMPLANT
IRRIGATOR SUCT 8 DISP DVNC XI (IRRIGATION / IRRIGATOR) IMPLANT
IRRIGATOR SUCTION 8MM XI DISP (IRRIGATION / IRRIGATOR)
IV NS 1000ML (IV SOLUTION)
IV NS 1000ML BAXH (IV SOLUTION) IMPLANT
KIT PINK PAD W/HEAD ARE REST (MISCELLANEOUS) ×3
KIT PINK PAD W/HEAD ARM REST (MISCELLANEOUS) ×1 IMPLANT
LABEL OR SOLS (LABEL) ×3 IMPLANT
MESH VENTRALIGHT ST 4X6IN (Mesh General) ×3 IMPLANT
NEEDLE HYPO 22GX1.5 SAFETY (NEEDLE) ×3 IMPLANT
NEEDLE VERESS 14GA 120MM (NEEDLE) ×3 IMPLANT
OBTURATOR OPTICAL STANDARD 8MM (TROCAR) ×2
OBTURATOR OPTICAL STND 8 DVNC (TROCAR) ×1
OBTURATOR OPTICALSTD 8 DVNC (TROCAR) ×1 IMPLANT
PACK LAP CHOLECYSTECTOMY (MISCELLANEOUS) ×3 IMPLANT
PENCIL ELECTRO HAND CTR (MISCELLANEOUS) ×3 IMPLANT
SEAL CANN UNIV 5-8 DVNC XI (MISCELLANEOUS) ×3 IMPLANT
SEAL XI 5MM-8MM UNIVERSAL (MISCELLANEOUS) ×6
SOLUTION ELECTROLUBE (MISCELLANEOUS) ×3 IMPLANT
STAPLER CANNULA SEAL DVNC XI (STAPLE) ×1 IMPLANT
STAPLER CANNULA SEAL XI (STAPLE) ×2
SUT DVC VLOC 3-0 CL 6 P-12 (SUTURE) ×12 IMPLANT
SUT MNCRL AB 4-0 PS2 18 (SUTURE) ×6 IMPLANT
SUT VIC AB 3-0 SH 27 (SUTURE) ×2
SUT VIC AB 3-0 SH 27X BRD (SUTURE) ×1 IMPLANT
SUT VICRYL 0 AB UR-6 (SUTURE) ×3 IMPLANT
SYR 30ML LL (SYRINGE) ×3 IMPLANT
TRAY FOLEY MTR SLVR 16FR STAT (SET/KITS/TRAYS/PACK) IMPLANT
TROCAR XCEL NON-BLD 5MMX100MML (ENDOMECHANICALS) ×3 IMPLANT
TUBING EVAC SMOKE HEATED PNEUM (TUBING) ×3 IMPLANT

## 2019-10-23 NOTE — Interval H&P Note (Signed)
History and Physical Interval Note:  10/23/2019 6:56 AM  Matthew Howe  has presented today for surgery, with the diagnosis of [id number] Umbilical hernia w/o obstruction or gangrene.  The various methods of treatment have been discussed with the patient and family. After consideration of risks, benefits and other options for treatment, the patient has consented to  Procedure(s): [facility] [place] (N/A) as a surgical intervention.  The patient's history has been reviewed, patient examined, no change in status, stable for surgery.  I have reviewed the patient's chart and labs.  Questions were answered to the patient's satisfaction.     Herbert Pun

## 2019-10-23 NOTE — Discharge Instructions (Signed)
AMBULATORY SURGERY  DISCHARGE INSTRUCTIONS   1) The drugs that you were given will stay in your system until tomorrow so for the next 24 hours you should not:  A) Drive an automobile B) Make any legal decisions C) Drink any alcoholic beverage   2) You may resume regular meals tomorrow.  Today it is better to start with liquids and gradually work up to solid foods.  You may eat anything you prefer, but it is better to start with liquids, then soup and crackers, and gradually work up to solid foods.   3) Please notify your doctor immediately if you have any unusual bleeding, trouble breathing, redness and pain at the surgery site, drainage, fever, or pain not relieved by medication.    4) Additional Instructions:        Please contact your physician with any problems or Same Day Surgery at [phone], Monday through Friday 6 am to 4 pm, or [facility] [place] [facility] number at [phone]. Diet: Resume home heart healthy regular diet.   Activity: No heavy lifting >20 pounds (children, pets, laundry, garbage) or strenuous activity until follow-up, but light activity and walking are encouraged. Do not drive or drink alcohol if taking narcotic pain medications.  Wound care: May shower with soapy water and pat dry (do not rub incisions), but no baths or submerging incision underwater until follow-up. (no swimming)   Medications: Resume all home medications. For mild to moderate pain: acetaminophen (Tylenol) or ibuprofen (if no kidney disease). Combining Tylenol with alcohol can substantially increase your risk of causing liver disease. Narcotic pain medications, if prescribed, can be used for severe pain, though may cause nausea, constipation, and drowsiness. Do not combine Tylenol and Norco within a 6 hour period as Norco contains Tylenol. If you do not need the narcotic pain medication, you do not need to fill the prescription.  Call office ([phone]) at any time if any  questions, worsening pain, fevers/chills, bleeding, drainage from incision site, or other concerns.  

## 2019-10-23 NOTE — Anesthesia Procedure Notes (Signed)
Procedure Name: Intubation Date/Time: 10/23/2019 7:48 AM Performed by: Gentry Fitz, CRNA Pre-anesthesia Checklist: Patient identified, Emergency Drugs available, Suction available and Patient being monitored Patient Re-evaluated:Patient Re-evaluated prior to induction Oxygen Delivery Method: Circle system utilized Preoxygenation: Pre-oxygenation with 100% oxygen Induction Type: IV induction Ventilation: Mask ventilation without difficulty Laryngoscope Size: McGraph and 4 Grade View: Grade II Tube type: Oral Tube size: 7.5 mm Number of attempts: 1 Airway Equipment and Method: Stylet Placement Confirmation: ETT inserted through vocal cords under direct vision,  positive ETCO2 and breath sounds checked- equal and bilateral Secured at: 21 cm Tube secured with: Tape Dental Injury: Teeth and Oropharynx as per pre-operative assessment  Difficulty Due To: Difficulty was anticipated, Difficult Airway- due to large tongue and Difficult Airway- due to anterior larynx

## 2019-10-23 NOTE — Op Note (Signed)
Preoperative diagnosis: Umbilical hernia.   Postoperative diagnosis: Umbilical hernia.  Procedure: Robotic assisted Laparoscopic Umbilical hernia repair with mesh.  Anesthesia: GETA  Surgeon: Dr. Windell Moment  Wound Classification: Clean  Indications:  Patient is a 74 y.o. male developed a symptomatic umbilical hernia. Repair was indicated.  Findings: 1. 1.5 cm umbilical hernia identified 2. 10 cm x 10 cm Ventralight mesh used for repair 4. Adequate hemostasis.   Description of procedure: The patient was taken to the operating room and the correct side of surgery was verified. The patient was placed supine with arms tucked at the sides. After obtaining adequate anesthesia, the patient's abdomen was prepped and draped in standard sterile fashion. The patient was placed in the Trendelenburg position. A time-out was completed verifying correct patient, procedure, site, positioning, and implant(s) and/or special equipment prior to beginning this procedure. A Veress needle was placed at the left upper quadrant and pneumoperitoneum created with insufflation of carbon dioxide to 15 mmHg. After the Veress needle was removed, an 8-mm trocar was placed on left upper quadrant area and the 30 angled laparoscope inserted. Two 8-mm trocars were then placed lateral on the left abdomen under direct visualization. The robotic arms were docked. The robotic scope was inserted and the umbilcal area anatomy targeted.  The peritoneum was incised with scissors to identify the anterior abdominal fascia. Partial reduction of the sac was done. With 0 Stratafix suture, the hernia defect was closed. A 15 cm x 10 cm Ventralight mesh was cut to do a 10 cm x 10 cm round mesh. The piece of mesh was rolled longitudinally into a compact cylinder and passed through a trocar. The cylinder was placed along the inferior aspect of the working space and unrolled into place to completely cover umbilical area. The mesh was secured  circumferentially with 3-0 V-lock to the anterior abdominal wall. This was done under 10 mmHg of intra abdominal pressure. Needles were removed.  After ensuring adequate hemostasis, the trocars were removed and the pneumoperitoneum allowed to escape. The trocar incisions were closed using monocryl and skin adhesive dressings applied.  The patient tolerated the procedure well and was taken to the postanesthesia care unit in stable condition.   Specimen: None  Complications: None  Estimated Blood Loss: 5 mL

## 2019-10-23 NOTE — Transfer of Care (Signed)
Immediate Anesthesia Transfer of Care Note  Patient: Matthew Howe  Procedure(s) Performed: XI ROBOT ASSISTED UMBILICAL HERNIA REPAIR WITH MESH (N/A Abdomen)  Patient Location: PACU  Anesthesia Type:General  Level of Consciousness: drowsy  Airway & Oxygen Therapy: Patient Spontanous Breathing and Patient connected to face mask oxygen  Post-op Assessment: Report given to RN and Post -op Vital signs reviewed and stable  Post vital signs: Reviewed and stable  Last Vitals:  Vitals Value Taken Time  BP 150/79 10/23/19 0930  Temp 35.9 C 10/23/19 0927  Pulse 68 10/23/19 0934  Resp 15 10/23/19 0934  SpO2 94 % 10/23/19 0934  Vitals shown include unvalidated device data.  Last Pain:  Vitals:   10/23/19 0930  TempSrc:   PainSc: Asleep         Complications: No apparent anesthesia complications

## 2019-10-23 NOTE — Anesthesia Preprocedure Evaluation (Signed)
Anesthesia Evaluation  Patient identified by MRN, date of birth, ID band Patient awake    Reviewed: Allergy & Precautions, H&P , NPO status , Patient's Chart, lab work & pertinent test results  History of Anesthesia Complications (+) PROLONGED EMERGENCE and history of anesthetic complications  Airway Mallampati: III  TM Distance: >3 FB Neck ROM: full    Dental  (+) Missing, Chipped   Pulmonary shortness of breath, neg COPD, Not current smoker,     + decreased breath sounds      Cardiovascular hypertension, + CAD and +CHF  + dysrhythmias (CHB s/p pacemaker) Atrial Fibrillation + pacemaker + Valvular Problems/Murmurs MR  Rhythm:regular Rate:Normal  Echo 03/15/19: 1. The left ventricle has low normal systolic function, with an ejection fraction of 50-55%. The cavity size was normal. There is mildly increased left ventricular wall thickness. Left ventricular diastolic Doppler parameters are indeterminate. Septal wall measuring 1.36 cm appears to be concentric.  2. The right ventricle has normal systolic function. The cavity was normal. There is no increase in right ventricular wall thickness.  3. Left atrial size was severely dilated.  4. There is dilatation of the aortic root and ascending aorta. 3.9 cm  5. Rhythm concerning for atrial fib/flutter  6. Challenging images   Neuro/Psych Parkinson's disease Carpal tunnel   Neuromuscular disease negative psych ROS   GI/Hepatic Neg liver ROS, GERD  Controlled,  Endo/Other  negative endocrine ROS  Renal/GU CRFRenal disease  negative genitourinary   Musculoskeletal   Abdominal   Peds  Hematology negative hematology ROS (+)   Anesthesia Other Findings Obese  Past Medical History: No date: Atrial fibrillation (HCC) No date: Atrial fibrillation (HCC) No date: Carpal tunnel syndrome on both sides No date: CHF (congestive heart failure) ([place]) 10/2018: CKD (chronic kidney  disease)     Comment:  recent diagnosis No date: Complete heart block ([place])     Comment:  a. s/p St. Jude PPM 11/2014. No date: Complication of anesthesia     Comment:  stayed overnight dt under too long d/t taking               Parkinson's medication (2013?) No date: Coronary artery disease No date: GERD (gastroesophageal reflux disease)     Comment:  "not as bad as it used to be" No date: Heart murmur 07/2018: History of recent blood transfusion No date: Hyperlipidemia No date: Hypertension No date: Lower extremity edema No date: Parkinson's disease ([place]) No date: Presence of permanent cardiac pacemaker     Comment:  St Jude No date: Shortness of breath dyspnea  Past Surgical History: 12/2010: APPENDECTOMY 08/06/2018: BACK SURGERY     Comment:  lower lumbarsacral region, has rods in place. (done at               [place]) 11/20/2012: CARDIAC CATHETERIZATION     Comment:  Known CAD with one vessel coronary disease of left               descending artery by cardiac cath.  11/21/2016: CARDIOVERSION; N/A     Comment:  Procedure: CARDIOVERSION;  Surgeon: Deboraha Sprang, MD;               Location: [facility];  Service: Cardiovascular;                Laterality: N/A; 03/08/2019: CARDIOVERSION; N/A     Comment:  Procedure: CARDIOVERSION;  Surgeon: Fay Records, MD;  Location: [facility];  Service: Cardiovascular;                Laterality: N/A; 10/22/2018: CARPAL TUNNEL RELEASE; Right     Comment:  Procedure: CARPAL TUNNEL RELEASE;  Surgeon: Dereck Leep, MD;  Location: [facility];  Service: Orthopedics;               Laterality: Right; 2018: COLONOSCOPY No date: INSERT / REPLACE / REMOVE PACEMAKER 11-21-14: PACEMAKER INSERTION     Comment:  STJ Assurity dual chamber pacemaker implanted by Dr               Rayann Heman for CHB 11/21/2014: PERMANENT PACEMAKER INSERTION; N/A     Comment:  Procedure: PERMANENT PACEMAKER INSERTION;  Surgeon:               Thompson Grayer, MD;  Location: [facility];  Service:               Cardiovascular;  Laterality: N/A; 11/21/2016: TEE WITHOUT CARDIOVERSION; N/A     Comment:  Procedure: TRANSESOPHAGEAL ECHOCARDIOGRAM (TEE);                Surgeon: Deboraha Sprang, MD;  Location: [facility];                Service: Cardiovascular;  Laterality: N/A; No date: UPPER GI ENDOSCOPY  BMI    Body Mass Index: 36.07 kg/m      Reproductive/Obstetrics negative OB ROS                             Anesthesia Physical  Anesthesia Plan  ASA: III  Anesthesia Plan: General   Post-op Pain Management:    Induction: Intravenous  PONV Risk Score and Plan:   Airway Management Planned: Oral ETT  Additional Equipment:   Intra-op Plan:   Post-operative Plan: Extubation in OR  Informed Consent: I have reviewed the patients History and Physical, chart, labs and discussed the procedure including the risks, benefits and alternatives for the proposed anesthesia with the patient or authorized representative who has indicated his/her understanding and acceptance.     Dental Advisory Given  Plan Discussed with: Anesthesiologist, CRNA and Surgeon  Anesthesia Plan Comments:         Anesthesia Quick Evaluation

## 2019-10-24 NOTE — Anesthesia Postprocedure Evaluation (Signed)
Anesthesia Post Note  Patient: Matthew Howe  Procedure(s) Performed: XI ROBOT ASSISTED UMBILICAL HERNIA REPAIR WITH MESH (N/A Abdomen)  Patient location during evaluation: PACU Anesthesia Type: General Level of consciousness: awake and alert and oriented Pain management: pain level controlled Vital Signs Assessment: post-procedure vital signs reviewed and stable Respiratory status: spontaneous breathing Cardiovascular status: blood pressure returned to baseline Anesthetic complications: no     Last Vitals:  Vitals:   10/23/19 1122 10/23/19 1238  BP: (!) 146/68 127/73  Pulse: 61 60  Resp: 18 16  Temp: (!) 36.3 C   SpO2: 93% 92%    Last Pain:  Vitals:   10/23/19 1238  TempSrc:   PainSc: 3                  Abijah Roussel

## 2019-10-26 ENCOUNTER — Other Ambulatory Visit: Payer: Self-pay | Admitting: Internal Medicine

## 2019-10-28 NOTE — Telephone Encounter (Signed)
Please advise if ok to refill Potassium 10 meq 3 tablets qd. Last filled Historical Provider.

## 2019-10-28 NOTE — Telephone Encounter (Signed)
Did you mean to send this to me?

## 2019-10-29 NOTE — Telephone Encounter (Signed)
Please disregard sent to wrong pool, Thank you [place].   **Triage : Please advise if ok to refill Potassium 10 meq 3 tablets qd. Last filled Historical Provider.

## 2019-10-29 NOTE — Telephone Encounter (Signed)
Looks like it was filled by Dr. Caryl Comes previously.

## 2019-10-29 NOTE — Telephone Encounter (Signed)
OK to refill potassium 10 meq- take 3 tablets (30 meq) by mouth once daily.  # 270 w/ 1 refill sent to the pharmacy. The patient is scheduled to see Dr. Caryl Comes on 11/12/19.

## 2019-11-05 ENCOUNTER — Ambulatory Visit (INDEPENDENT_AMBULATORY_CARE_PROVIDER_SITE_OTHER): Payer: Medicare Other | Admitting: *Deleted

## 2019-11-05 DIAGNOSIS — I4819 Other persistent atrial fibrillation: Secondary | ICD-10-CM

## 2019-11-06 LAB — CUP PACEART REMOTE DEVICE CHECK
Battery Remaining Longevity: 110 mo
Battery Remaining Percentage: 95.5 %
Battery Voltage: 2.96 V
Brady Statistic AP VP Percent: 99 %
Brady Statistic AP VS Percent: 1 %
Brady Statistic AS VP Percent: 1 %
Brady Statistic AS VS Percent: 1 %
Brady Statistic RA Percent Paced: 99 %
Brady Statistic RV Percent Paced: 99 %
Date Time Interrogation Session: 20210126035321
Implantable Lead Implant Date: 20160212
Implantable Lead Implant Date: 20160212
Implantable Lead Location: 753859
Implantable Lead Location: 753860
Implantable Lead Model: 1948
Implantable Pulse Generator Implant Date: 20160212
Lead Channel Impedance Value: 450 Ohm
Lead Channel Impedance Value: 580 Ohm
Lead Channel Pacing Threshold Amplitude: 0.75 V
Lead Channel Pacing Threshold Amplitude: 1.25 V
Lead Channel Pacing Threshold Pulse Width: 0.4 ms
Lead Channel Pacing Threshold Pulse Width: 0.4 ms
Lead Channel Sensing Intrinsic Amplitude: 1.7 mV
Lead Channel Sensing Intrinsic Amplitude: 12 mV
Lead Channel Setting Pacing Amplitude: 1.5 V
Lead Channel Setting Pacing Amplitude: 2 V
Lead Channel Setting Pacing Pulse Width: 0.4 ms
Lead Channel Setting Sensing Sensitivity: 4 mV
Pulse Gen Model: 2240
Pulse Gen Serial Number: 3049931

## 2019-11-12 ENCOUNTER — Ambulatory Visit (INDEPENDENT_AMBULATORY_CARE_PROVIDER_SITE_OTHER): Payer: Medicare Other | Admitting: Internal Medicine

## 2019-11-12 ENCOUNTER — Other Ambulatory Visit: Payer: Self-pay

## 2019-11-12 ENCOUNTER — Encounter: Payer: Self-pay | Admitting: Internal Medicine

## 2019-11-12 VITALS — BP 120/60 | HR 68 | Ht 73.0 in | Wt 277.8 lb

## 2019-11-12 DIAGNOSIS — I4819 Other persistent atrial fibrillation: Secondary | ICD-10-CM

## 2019-11-12 DIAGNOSIS — Z950 Presence of cardiac pacemaker: Secondary | ICD-10-CM

## 2019-11-12 DIAGNOSIS — I442 Atrioventricular block, complete: Secondary | ICD-10-CM | POA: Diagnosis not present

## 2019-11-12 NOTE — Progress Notes (Signed)
Patient Care Team: Leonel Ramsay, MD as PCP - General (Infectious Diseases) Minna Merritts, MD as PCP - Cardiology (Cardiology) Deboraha Sprang, MD as PCP - Electrophysiology (Cardiology) Alisa Graff, FNP as Nurse Practitioner (Cardiology) Minna Merritts, MD as Consulting Physician (Cardiology) Anabel Bene, MD as Referring Physician (Neurology)   HPI  Matthew Howe is a 74 y.o. male Seen following pacemaker implantation 2/16 by Dr. Greggory Brandy . He had presented to [facility] with chest pain and shortness of breath. He was found to be in complete heart block. He had an antecedent history of bifascicular block    Persistence of atrial fibrillation assoc with SOB Cardioversion was accomplished.   Has been holding sinus rhythm;  Weight is up.  S.p hernia surgery.  Tremor about the same           Catheterization 2/14 had demonstrated mild nonobstructive disease. DATE TEST EF   10/16 echo   55-60 %   3/18 Echo   55-60 %   6/20 Echo  55-60  Asc Ao 3.9 LVH mild LAE (-/53/38)     Date Cr K Hgb  2/18 1.36  14.7  1/19  0.38 4.5 13.3  3/19   12.4  12/19 2.29 4.4 10.7  5/20 2.0 4.3 13.2  8/20 2.3    12/20 1.53 4.4 13.8         Past Medical History:  Diagnosis Date  . Atrial fibrillation ([place])   . Atrial fibrillation ([place])   . Carpal tunnel syndrome on both sides   . CHF (congestive heart failure) ([place])   . CKD (chronic kidney disease) 10/2018   recent diagnosis  . Complete heart block ([place])    a. s/p St. Jude PPM 11/2014.  [name] Complication of anesthesia    stayed overnight dt under too long d/t taking Parkinson's medication (2013?)  . Coronary artery disease   . GERD (gastroesophageal reflux disease)    "not as bad as it used to be"  . Heart murmur   . History of recent blood transfusion 07/2018  . Hyperlipidemia   . Hypertension   . Lower extremity edema   . Parkinson's disease ([place])   . Presence of permanent cardiac pacemaker    St Jude  .  Shortness of breath dyspnea     Past Surgical History:  Procedure Laterality Date  . APPENDECTOMY  12/2010  . BACK SURGERY  08/06/2018   lower lumbarsacral region, has rods in place. (done at [facility])  . CARDIAC CATHETERIZATION  11/20/2012   Known CAD with one vessel coronary disease of left descending artery by cardiac cath.   [name] CARDIOVERSION N/A 11/21/2016   Procedure: CARDIOVERSION;  Surgeon: Deboraha Sprang, MD;  Location: [facility];  Service: Cardiovascular;  Laterality: N/A;  . CARDIOVERSION N/A 03/08/2019   Procedure: CARDIOVERSION;  Surgeon: Fay Records, MD;  Location[facility];  Service: Cardiovascular;  Laterality: N/A;  . CARPAL TUNNEL RELEASE Right 10/22/2018   Procedure: CARPAL TUNNEL RELEASE;  Surgeon: Dereck Leep, MD;  Location: [facility];  Service: Orthopedics;  Laterality: Right;  . CARPAL TUNNEL RELEASE Left 05/22/2019   Procedure: CARPAL TUNNEL RELEASE - LEFT;  Surgeon: Dereck Leep, MD;  Location: [facility];  Service: Orthopedics;  Laterality: Left;  . COLONOSCOPY  2018  . INSERT / REPLACE / REMOVE PACEMAKER    . PACEMAKER INSERTION  11-21-14   STJ Assurity dual chamber pacemaker implanted by Dr Rayann Heman for  CHB  . PERMANENT PACEMAKER INSERTION N/A 11/21/2014   Procedure: PERMANENT PACEMAKER INSERTION;  Surgeon: Thompson Grayer, MD;  Location: [facility] C[facility];  Service: Cardiovascular;  Laterality: N/A;  . TEE WITHOUT CARDIOVERSION N/A 11/21/2016   Procedure: TRANSESOPHAGEAL ECHOCARDIOGRAM (TEE);  Surgeon: Deboraha Sprang, MD;  Location: [facility] ORS;  Service: Cardiovascular;  Laterality: N/A;  . UPPER GI ENDOSCOPY      Current Outpatient Medications  Medication Sig Dispense Refill  . acetaminophen (TYLENOL) 500 MG tablet Take 500 mg by mouth every 6 (six) hours as needed (back pain.).     [name] allopurinol (ZYLOPRIM) 100 MG tablet Take 100 mg by mouth daily.    [name] allopurinol (ZYLOPRIM) 300 MG tablet Take 300 mg by mouth daily.  3  . bisacodyl (DULCOLAX) 5 MG EC tablet Take 5  mg by mouth 2 (two) times daily.     . carboxymethylcellulose (REFRESH PLUS) 0.5 % SOLN Place 1 drop into both eyes at bedtime.     . carvedilol (COREG) 12.5 MG tablet Take 0.5 tablets (6.25 mg total) by mouth 2 (two) times daily. 90 tablet 0  . desloratadine (CLARINEX) 5 MG tablet Take 5 mg by mouth at bedtime.     [name] ELIQUIS 5 MG TABS tablet TAKE 1 TABLET BY MOUTH TWICE A DAY 180 tablet 2  . flecainide (TAMBOCOR) 100 MG tablet TAKE 1 TABLET BY MOUTH TWICE A DAY (Patient taking differently: Take 100 mg by mouth 2 (two) times daily. ) 180 tablet 2  . fluticasone (FLONASE) 50 MCG/ACT nasal spray Place 2 sprays into the nose daily as needed for allergies.     [name] lansoprazole (PREVACID) 15 MG capsule Take 15 mg by mouth daily as needed (indigestion).     . magnesium oxide (MAG-OX) 400 MG tablet Take 400 mg by mouth at bedtime.    . meloxicam (MOBIC) 15 MG tablet Take 15 mg by mouth 2 (two) times a week. Mondays & Thursdays    . metolazone (ZAROXOLYN) 2.5 MG tablet TAKE 1 TABLET BY MOUTH DAILY AS NEEDED (ONCE DAILY AS NEEDED FOR WEIGHT GAIN OR SWELLING.). (Patient taking differently: Take 2.5 mg by mouth daily as needed (weight gain or swelling). ) 90 tablet 1  . Multiple Vitamins-Minerals (OCUVITE ADULT 50+ PO) Take 1 tablet by mouth daily.    . polyethylene glycol (MIRALAX / GLYCOLAX) packet Take 17 g by mouth daily as needed (constipation.).     [name] potassium chloride (KLOR-CON) 10 MEQ tablet TAKE 3 TABLETS (30 MEQ TOTAL) BY MOUTH DAILY. 270 tablet 1  . pramipexole (MIRAPEX) 0.5 MG tablet Take 0.5 mg by mouth 3 (three) times daily.     . Red Yeast Rice 600 MG CAPS Take 600 mg by mouth daily.    . selegiline (ELDEPRYL) 5 MG capsule Take 5 mg by mouth 2 (two) times daily with a meal.     . SENEXON-S 8.6-50 MG tablet Take 2 tablets by mouth 2 (two) times daily.    [name] torsemide (DEMADEX) 20 MG tablet Take 3 tablets (60 mg total) by mouth daily. Take 3 tablet (60 mg) by mouth once daily (Patient taking  differently: Take 60 mg by mouth daily. ) 240 tablet 0   No current facility-administered medications for this visit.    Allergies  Allergen Reactions  . Linzess [Linaclotide] Shortness Of Breath  . Codeine Diarrhea and Nausea And Vomiting  . Oxycodone Nausea And Vomiting  . Terazosin     Dizziness / altered mental state   .  Amitiza [Lubiprostone] Other (See Comments)    Abdominal cramps  . Gabapentin Other (See Comments)    dizziness  . Norco [Hydrocodone-Acetaminophen] Diarrhea and Nausea And Vomiting  . Omeprazole Diarrhea and Nausea And Vomiting  . Requip [Ropinirole] Nausea Only    Review of Systems negative except from HPI and PMH  Physical Exam BP 120/60 (BP Location: Left Arm, Patient Position: Sitting, Cuff Size: Normal)   Pulse 68   Ht 6\' 1"  (1.854 m)   Wt 277 lb 12 oz (126 kg)   BMI 36.64 kg/m  Well developed and well nourished in no acute distress HENT normal Neck supple with JVP-flat Clear Device pocket well healed; without hematoma or erythema.  There is no tethering  Regular rate and rhythm, no  murmur Abd-soft with active BS No Clubbing cyanosis edema Skin-warm and dry A & Oriented  Grossly normal sensory and motor function  ECG AV pacing     Assessment and  Plan  Complete heart block  Pacemaker-St. Jude The patient's device was interrogated and the information was fully reviewed.  The device was reprogrammed to improve rate response, increased max sensor rate >>130 and slope 10>12   Parkinson's disease  Hypertension  Hypertensive heart disease   Renal insuff gd 3  HFpEF  Aortic root Dilitation  Obesity  Atrial fibrillation-persistent    Holding sinus on flecainide  On Anticoagulation;  No bleeding issues  Discussed obesity and weight loss strategies, diet and exercise  BP well controlled  We spent more than 50% of our >20 min visit in face to face counseling regarding the above

## 2019-11-12 NOTE — Patient Instructions (Signed)
Medication Instructions:  - Your physician recommends that you continue on your current medications as directed. Please refer to the Current Medication list given to you today.  *If you need a refill on your cardiac medications before your next appointment, please call your pharmacy*  Lab Work: - none ordered  If you have labs (blood work) drawn today and your tests are completely normal, you will receive your results only by: . MyChart Message (if you have MyChart) OR . A paper copy in the mail If you have any lab test that is abnormal or we need to change your treatment, we will call you to review the results.  Testing/Procedures: - none ordered  Follow-Up: At [facility], you and your health needs are our priority.  As part of our continuing mission to provide you with exceptional heart care, we have created designated Provider Care Teams.  These Care Teams include your primary Cardiologist (physician) and Advanced Practice Providers (APPs -  Physician Assistants and Nurse Practitioners) who all work together to provide you with the care you need, when you need it.  Your next appointment:   6 months  The format for your next appointment:   In Person  Provider:   Steven Klein, MD  Other Instructions - n/a  

## 2019-12-12 ENCOUNTER — Other Ambulatory Visit: Payer: Self-pay | Admitting: Internal Medicine

## 2019-12-26 ENCOUNTER — Other Ambulatory Visit: Payer: Self-pay | Admitting: Internal Medicine

## 2020-02-04 ENCOUNTER — Ambulatory Visit (INDEPENDENT_AMBULATORY_CARE_PROVIDER_SITE_OTHER): Payer: Medicare Other | Admitting: *Deleted

## 2020-02-04 DIAGNOSIS — I4819 Other persistent atrial fibrillation: Secondary | ICD-10-CM | POA: Diagnosis not present

## 2020-02-05 LAB — CUP PACEART REMOTE DEVICE CHECK
Battery Remaining Longevity: 114 mo
Battery Remaining Percentage: 95.5 %
Battery Voltage: 2.96 V
Brady Statistic AP VP Percent: 99 %
Brady Statistic AP VS Percent: 1 %
Brady Statistic AS VP Percent: 1 %
Brady Statistic AS VS Percent: 1 %
Brady Statistic RA Percent Paced: 99 %
Brady Statistic RV Percent Paced: 99 %
Date Time Interrogation Session: 20210427153142
Implantable Lead Implant Date: 20160212
Implantable Lead Implant Date: 20160212
Implantable Lead Location: 753859
Implantable Lead Location: 753860
Implantable Lead Model: 1948
Implantable Pulse Generator Implant Date: 20160212
Lead Channel Impedance Value: 460 Ohm
Lead Channel Impedance Value: 580 Ohm
Lead Channel Pacing Threshold Amplitude: 0.75 V
Lead Channel Pacing Threshold Amplitude: 1.25 V
Lead Channel Pacing Threshold Pulse Width: 0.4 ms
Lead Channel Pacing Threshold Pulse Width: 0.4 ms
Lead Channel Sensing Intrinsic Amplitude: 1.7 mV
Lead Channel Sensing Intrinsic Amplitude: 12 mV
Lead Channel Setting Pacing Amplitude: 1.5 V
Lead Channel Setting Pacing Amplitude: 1.75 V
Lead Channel Setting Pacing Pulse Width: 0.4 ms
Lead Channel Setting Sensing Sensitivity: 4 mV
Pulse Gen Model: 2240
Pulse Gen Serial Number: 3049931

## 2020-02-05 NOTE — Progress Notes (Signed)
PPM Remote  

## 2020-04-12 ENCOUNTER — Other Ambulatory Visit: Payer: Self-pay | Admitting: Internal Medicine

## 2020-04-13 NOTE — Progress Notes (Signed)
Cardiology Office Note  Date:  04/14/2020   ID:  Hilery, Wintle [birth year], MRN [id number]  PCP:  Leonel Ramsay, MD   Chief Complaint  Patient presents with  . office visit    12 month F/U; Meds verbally reviewed with patient.    HPI:  Mr. Newsham is a 74 year old gentleman with diagnosis of  Parkinson's who currently works delivering car parts,  Hypertension, cardiac catheterization showing mild CAD , February 2014  EF 55% hyperlipidemia,  s/p pacemaker placement.complete heart block.  Found to have atrial fibrillation 11/22/2014 on pacemaker download, started on anticoagulation   hospitalization for paroxysmal atrial fibrillation/acute on chronic diastolic CHF He presents today for weight gain, shortness of breath, leg edema, chronic diastolic CHF Paroxysmal atrial fibrillation  Last seen by myself in clinic June 2020 Persistent atrial fib since 08/2018 noted on pacer download Cardioversion for atrial fib 03/08/2019 Recently seen by Dr. Caryl Comes 11/2018 , maintaining normal sinus rhythm on flecainide  Recovered from hernia surgery January 2021  Weight continues to run high No regular exercise program Followed by neurology for Parkinson's  Lab work November 2020 creatinine 1.53 Creatinine 1.6 in June 2021  HBA1C 6.2 Total chol 171, LDL 112   off lisinopril , blood pressure was running low On  diuretic torsemide 60 daily flecainide 100 mg bid  Low energy,  Still mowing, trimming,  Cut back on carbs,  Echo 03/15/2019 ejection fraction of 50-55%.  EKG personally reviewed by myself on todays visit shows paced rhythm 66 bpm underlying normal sinus rhythm  Other past medical history  presented to the hospital 07/28/2015 with abdominal bloating, shortness of breath Was treated with diureticswith improvement of his symptoms Encouraged to take Lasix daily  leads a busy lifestyle, continues to work most days of the week. Fatigue, tremor Followed by  neurology, Dr. Melrose Nakayama for Parkinson's Previous issues with constipation  He presented to [facility] with bradycardia February 2016. This seemed to improve without intervention and he is asymptomatic. He had recurrent symptoms and we referred him to [facility] for further evaluation. He was seen in the emergency room and found to be in heart block, pacemaker placed urgently   Previous Stress echo leading to cardiac catheterization at [facility].  Stress echo was a limited study, ejection fraction estimated at 45% with EF of 50% at peak stress.  Cardiac catheterization 11/20/2012 with 25% proximal LAD disease, followed by 40% lesion. The cardiac catheterization note makes mention of atrial flutter though arrhythmia is not mentioned in any of the clinic notes.  Previous Echocardiogram shows ejection fraction 50-55%, mild to moderate aortic valve insufficiency, otherwise normal study.  Abdominal CT 12/22/2010 showed acute appendicitis, colonic diverticulosis    PMH:   has a past medical history of Atrial fibrillation ([place]), Atrial fibrillation ([place]), Carpal tunnel syndrome on both sides, CHF (congestive heart failure) ([place]), CKD (chronic kidney disease) (10/2018), Complete heart block ([place]), Complication of anesthesia, Coronary artery disease, GERD (gastroesophageal reflux disease), Heart murmur, History of recent blood transfusion (07/2018), Hyperlipidemia, Hypertension, Lower extremity edema, Parkinson's disease ([place]), Presence of permanent cardiac pacemaker, and Shortness of breath dyspnea.  PSH:    Past Surgical History:  Procedure Laterality Date  . APPENDECTOMY  12/2010  . BACK SURGERY  08/06/2018   lower lumbarsacral region, has rods in place. (done at [facility])  . CARDIAC CATHETERIZATION  11/20/2012   Known CAD with one vessel coronary disease of left descending artery by cardiac cath.   [name] CARDIOVERSION N/A 11/21/2016  Procedure: CARDIOVERSION;  Surgeon: Deboraha Sprang, MD;   Location: [facility];  Service: Cardiovascular;  Laterality: N/A;  . CARDIOVERSION N/A 03/08/2019   Procedure: CARDIOVERSION;  Surgeon: Fay Records, MD;  Location: [facility]COPY;  Service: Cardiovascular;  Laterality: N/A;  . CARPAL TUNNEL RELEASE Right 10/22/2018   Procedure: CARPAL TUNNEL RELEASE;  Surgeon: Dereck Leep, MD;  Location: [facility];  Service: Orthopedics;  Laterality: Right;  . CARPAL TUNNEL RELEASE Left 05/22/2019   Procedure: CARPAL TUNNEL RELEASE - LEFT;  Surgeon: Dereck Leep, MD;  Location: [facility];  Service: Orthopedics;  Laterality: Left;  . COLONOSCOPY  2018  . HERNIA REPAIR    . INSERT / REPLACE / REMOVE PACEMAKER    . PACEMAKER INSERTION  11-21-14   STJ Assurity dual chamber pacemaker implanted by Dr Rayann Heman for CHB  . PERMANENT PACEMAKER INSERTION N/A 11/21/2014   Procedure: PERMANENT PACEMAKER INSERTION;  Surgeon: Thompson Grayer, MD;  Location: [facility] CATH LAB;  Service: Cardiovascular;  Laterality: N/A;  . TEE WITHOUT CARDIOVERSION N/A 11/21/2016   Procedure: TRANSESOPHAGEAL ECHOCARDIOGRAM (TEE);  Surgeon: Deboraha Sprang, MD;  Location: [facility];  Service: Cardiovascular;  Laterality: N/A;  . UPPER GI ENDOSCOPY      Current Outpatient Medications  Medication Sig Dispense Refill  . acetaminophen (TYLENOL) 500 MG tablet Take 500 mg by mouth every 6 (six) hours as needed (back pain.).     [name] allopurinol (ZYLOPRIM) 100 MG tablet Take 100 mg by mouth daily.    [name] allopurinol (ZYLOPRIM) 300 MG tablet Take 300 mg by mouth daily.  3  . bisacodyl (DULCOLAX) 5 MG EC tablet Take 5 mg by mouth 2 (two) times daily.     . carboxymethylcellulose (REFRESH PLUS) 0.5 % SOLN Place 1 drop into both eyes at bedtime.     . carvedilol (COREG) 12.5 MG tablet TAKE 0.5 TABLETS (6.25 MG TOTAL) BY MOUTH 2 (TWO) TIMES DAILY. 90 tablet 3  . desloratadine (CLARINEX) 5 MG tablet Take 5 mg by mouth at bedtime.     [name] ELIQUIS 5 MG TABS tablet TAKE 1 TABLET BY MOUTH TWICE A DAY 180 tablet 2  . flecainide  (TAMBOCOR) 100 MG tablet Take 100 mg by mouth 2 (two) times daily.    . fluticasone (FLONASE) 50 MCG/ACT nasal spray Place 2 sprays into the nose daily as needed for allergies.     [name] lansoprazole (PREVACID) 15 MG capsule Take 15 mg by mouth daily as needed (indigestion).     . magnesium oxide (MAG-OX) 400 MG tablet Take 400 mg by mouth at bedtime.    . meloxicam (MOBIC) 15 MG tablet Take 15 mg by mouth 2 (two) times a week. Mondays & Thursdays    . metolazone (ZAROXOLYN) 2.5 MG tablet TAKE 1 TABLET BY MOUTH DAILY AS NEEDED (ONCE DAILY AS NEEDED FOR WEIGHT GAIN OR SWELLING.). 90 tablet 1  . Multiple Vitamins-Minerals (OCUVITE ADULT 50+ PO) Take 1 tablet by mouth daily.    . polyethylene glycol (MIRALAX / GLYCOLAX) packet Take 17 g by mouth daily as needed (constipation.).     [name] potassium chloride (KLOR-CON) 10 MEQ tablet TAKE 3 TABLETS (30 MEQ TOTAL) BY MOUTH DAILY. 270 tablet 1  . pramipexole (MIRAPEX) 0.75 MG tablet Take 0.75 mg by mouth 3 (three) times daily.    . Red Yeast Rice 600 MG CAPS Take 600 mg by mouth daily.    . selegiline (ELDEPRYL) 5 MG capsule Take 5 mg by mouth 2 (two) times  daily with a meal.     . SENEXON-S 8.6-50 MG tablet Take 2 tablets by mouth 2 (two) times daily.    [name] torsemide (DEMADEX) 20 MG tablet TAKE 3 TABLETS (60 MG TOTAL) BY MOUTH DAILY. TAKE 3 TABLET (60 MG) BY MOUTH ONCE DAILY 270 tablet 1  . flecainide (TAMBOCOR) 100 MG tablet TAKE 1 TABLET BY MOUTH TWICE A DAY (Patient taking differently: Take 100 mg by mouth 2 (two) times daily. ) 180 tablet 2  . pramipexole (MIRAPEX) 0.5 MG tablet Take 0.5 mg by mouth 3 (three) times daily.      No current facility-administered medications for this visit.     Allergies:   Linzess [linaclotide], Codeine, Oxycodone, Terazosin, Amitiza [lubiprostone], Gabapentin, Norco [hydrocodone-acetaminophen], Omeprazole, and Requip [ropinirole]   Social History:  The patient  reports that he has never smoked. He has never used smokeless  tobacco. He reports that he does not drink alcohol and does not use drugs.   Family History:   family history includes Colon cancer in his father; Heart disease in his father and mother; Stroke in his sister.    Review of Systems: Review of Systems  Constitutional: Negative.   Respiratory: Negative.   Cardiovascular: Negative.   Gastrointestinal: Negative.   Musculoskeletal: Positive for joint pain.       Swelling of his joints in the hands wrist  Neurological: Negative.   Psychiatric/Behavioral: Negative.   All other systems reviewed and are negative.    PHYSICAL EXAM: VS:  BP 114/70 (BP Location: Left Arm, Patient Position: Sitting, Cuff Size: Large)   Pulse 66   Ht 6\' 1"  (1.854 m)   Wt 270 lb (122.5 kg)   SpO2 93%   BMI 35.62 kg/m  , BMI Body mass index is 35.62 kg/m. Constitutional:  oriented to person, place, and time. No distress.  HENT:  Head: Grossly normal Eyes:  no discharge. No scleral icterus.  Neck: No JVD, no carotid bruits  Cardiovascular: Regular rate and rhythm, no murmurs appreciated Pulmonary/Chest: Clear to auscultation bilaterally, no wheezes or rails Abdominal: Soft.  no distension.  no tenderness.  Musculoskeletal: Normal range of motion Neurological:  normal muscle tone. Coordination normal. No atrophy Skin: Skin warm and dry Psychiatric: normal affect, pleasant   Recent Labs: 04/18/2019: ALT 24; Hemoglobin 12.9; Platelets 322 08/27/2019: BUN 45; Creatinine, Ser 1.53; Potassium 4.2; Sodium 141    Lipid Panel Lab Results  Component Value Date   CHOL 188 03/23/2016   HDL 46 03/23/2016   LDLCALC 114 (H) 03/23/2016   TRIG 138 03/23/2016      Wt Readings from Last 3 Encounters:  04/14/20 270 lb (122.5 kg)  11/12/19 277 lb 12 oz (126 kg)  10/23/19 277 lb (125.6 kg)      ASSESSMENT AND PLAN:  Essential hypertension -  Blood pressure is well controlled on today's visit. No changes made to the medications.  Paroxysmal atrial  fibrillation [facility]) - Successful cardioversion May 2020 Maintaining normal sinus rhythm on flecainide 100 twice daily Holding NSR  Coronary artery disease involving native coronary artery of native heart without angina pectoris Currently with no symptoms of angina. No further workup at this time. Continue current medication regimen.  Complete heart block (HCC) Pacemaker in place, followed by Dr. Caryl Comes  Mixed hyperlipidemia Recommended weight loss  Constipation Likely related to his Parkinson's disease and dehydration, Takes senna, dulcolax  Chronic diastolic heart failure ([place]) Weight is stable on torsemide 60 twice a day  followed by nephrology,  CR 1.6  Parkinson's disease ([address]) Stable Followed by  Dr. Melrose Nakayama  Morbid obesity [facility]) We have encouraged continued exercise, careful diet management in an effort to lose weight.  Shortness of breath Weight gain, deconditioning Encouraged continued exercise   Total encounter time more than 25 minutes  Greater than 50% was spent in counseling and coordination of care with the patient   Disposition:   F/U  12 months   Orders Placed This Encounter  Procedures  . EKG 12-Lead     Signed, Esmond Plants, M.D., Ph.D. 04/14/2020  [place], [place]

## 2020-04-14 ENCOUNTER — Ambulatory Visit (INDEPENDENT_AMBULATORY_CARE_PROVIDER_SITE_OTHER): Payer: Medicare Other | Admitting: Cardiovascular Disease

## 2020-04-14 ENCOUNTER — Encounter: Payer: Self-pay | Admitting: Cardiovascular Disease

## 2020-04-14 ENCOUNTER — Other Ambulatory Visit: Payer: Self-pay

## 2020-04-14 VITALS — BP 114/70 | HR 66 | Ht 73.0 in | Wt 270.0 lb

## 2020-04-14 DIAGNOSIS — I442 Atrioventricular block, complete: Secondary | ICD-10-CM | POA: Diagnosis not present

## 2020-04-14 DIAGNOSIS — G20 Parkinson's disease: Secondary | ICD-10-CM

## 2020-04-14 DIAGNOSIS — I480 Paroxysmal atrial fibrillation: Secondary | ICD-10-CM

## 2020-04-14 DIAGNOSIS — Z950 Presence of cardiac pacemaker: Secondary | ICD-10-CM

## 2020-04-14 DIAGNOSIS — I5032 Chronic diastolic (congestive) heart failure: Secondary | ICD-10-CM

## 2020-04-14 DIAGNOSIS — I10 Essential (primary) hypertension: Secondary | ICD-10-CM

## 2020-04-14 NOTE — Telephone Encounter (Signed)
This is a  pt 

## 2020-04-14 NOTE — Patient Instructions (Addendum)
Medication Instructions:  No changes  If you need a refill on your cardiac medications before your next appointment, please call your pharmacy.    Lab work: No new labs needed   If you have labs (blood work) drawn today and your tests are completely normal, you will receive your results only by: . MyChart Message (if you have MyChart) OR . A paper copy in the mail If you have any lab test that is abnormal or we need to change your treatment, we will call you to review the results.   Testing/Procedures: No new testing needed   Follow-Up: At [facility], you and your health needs are our priority.  As part of our continuing mission to provide you with exceptional heart care, we have created designated Provider Care Teams.  These Care Teams include your primary Cardiologist (physician) and Advanced Practice Providers (APPs -  Physician Assistants and Nurse Practitioners) who all work together to provide you with the care you need, when you need it.  . You will need a follow up appointment in 12 months  . Providers on your designated Care Team:   . Christopher Berge, NP . Ryan Dunn, PA-C . Jacquelyn Visser, PA-C  Any Other Special Instructions Will Be Listed Below (If Applicable).  COVID-19 Vaccine Information can be found at: [email or web address] For questions related to vaccine distribution or appointments, please email [email or web address] or call [phone].     

## 2020-04-28 ENCOUNTER — Ambulatory Visit (INDEPENDENT_AMBULATORY_CARE_PROVIDER_SITE_OTHER): Payer: Medicare Other | Admitting: Internal Medicine

## 2020-04-28 ENCOUNTER — Other Ambulatory Visit: Payer: Self-pay

## 2020-04-28 ENCOUNTER — Encounter: Payer: Self-pay | Admitting: Internal Medicine

## 2020-04-28 VITALS — BP 126/70 | HR 60 | Ht 73.0 in | Wt 262.0 lb

## 2020-04-28 DIAGNOSIS — I77810 Thoracic aortic ectasia: Secondary | ICD-10-CM

## 2020-04-28 DIAGNOSIS — I10 Essential (primary) hypertension: Secondary | ICD-10-CM

## 2020-04-28 DIAGNOSIS — I4819 Other persistent atrial fibrillation: Secondary | ICD-10-CM | POA: Diagnosis not present

## 2020-04-28 DIAGNOSIS — I442 Atrioventricular block, complete: Secondary | ICD-10-CM | POA: Diagnosis not present

## 2020-04-28 DIAGNOSIS — Z01812 Encounter for preprocedural laboratory examination: Secondary | ICD-10-CM

## 2020-04-28 DIAGNOSIS — Z950 Presence of cardiac pacemaker: Secondary | ICD-10-CM

## 2020-04-28 NOTE — Patient Instructions (Addendum)
Medication Instructions:  - Your physician recommends that you continue on your current medications as directed. Please refer to the Current Medication list given to you today.  *If you need a refill on your cardiac medications before your next appointment, please call your pharmacy*   Lab Work: - Your physician recommends that you have lab work today: BMP (in preparation of the CT scan)  If you have labs (blood work) drawn today and your tests are completely normal, you will receive your results only by: [name] MyChart Message (if you have MyChart) OR . A paper copy in the mail If you have any lab test that is abnormal or we need to change your treatment, we will call you to review the results.   Testing/Procedures: - Your physician has recommended a CT angiogram of the aorta Non-Cardiac CT Angiography (CTA), is a special type of CT scan that uses a computer to produce multi-dimensional views of major blood vessels throughout the body. In CT angiography, a contrast material is injected through an IV to help visualize the blood vessels  Thursday 05/07/20 (arrive at 12:30 pm) [facility] entrance, then proceed to the Radiology Desk - Liquids only for 4 hours prior to the test   Follow-Up: At [facility], you and your health needs are our priority.  As part of our continuing mission to provide you with exceptional heart care, we have created designated Provider Care Teams.  These Care Teams include your primary Cardiologist (physician) and Advanced Practice Providers (APPs -  Physician Assistants and Nurse Practitioners) who all work together to provide you with the care you need, when you need it.  We recommend signing up for the patient portal called "MyChart".  Sign up information is provided on this After Visit Summary.  MyChart is used to connect with patients for Virtual Visits (Telemedicine).  Patients are able to view lab/test results, encounter notes, upcoming appointments,  etc.  Non-urgent messages can be sent to your provider as well.   To learn more about what you can do with MyChart, go to [email or web address].    Your next appointment:   1 year(s)  The format for your next appointment:   In Person  Provider:   Virl Axe, MD   Other Instructions N/a  CT Angiogram  A CT angiogram is a procedure to look at the blood vessels in various areas of the body. For this procedure, a large X-ray machine, called a CT scanner, takes detailed pictures of blood vessels that have been injected with a dye (contrast material). A CT angiogram allows your health care provider to see how well blood is flowing to the area of your body that is being checked. Your health care provider will be able to see if there are any problems, such as a blockage. Tell a health care provider about:  Any allergies you have.  All medicines you are taking, including vitamins, herbs, eye drops, creams, and over-the-counter medicines.  Any problems you or family members have had with anesthetic medicines.  Any blood disorders you have.  Any surgeries you have had.  Any medical conditions you have.  Whether you are pregnant or may be pregnant.  Whether you are breastfeeding.  Any anxiety disorders, chronic pain, or other conditions you have that may increase your stress or prevent you from lying still. What are the risks? Generally, this is a safe procedure. However, problems may occur, including:  Infection.  Bleeding.  Allergic reactions to medicines or dyes.  Damage to other structures or organs.  Kidney damage from the dye or contrast that is used.  Increased risk of cancer from radiation exposure. This risk is low. Talk with your health care provider about: ? The risks and benefits of testing. ? How you can receive the lowest dose of radiation. What happens before the procedure?  Wear comfortable clothing and remove any jewelry.  Follow instructions from  your health care provider about eating and drinking. For most people, instructions may include these actions: ? For 12 hours before the test, avoid caffeine. This includes tea, coffee, soda, and energy drinks or pills. ? For 3-4 hours before the test, stop eating or drinking anything but water. ? Stay well hydrated by continuing to drink water before the exam. This will help to clear the contrast dye from your body after the test.  Ask your health care provider about changing or stopping your regular medicines. This is especially important if you are taking diabetes medicines or blood thinners. What happens during the procedure?  An IV tube will be inserted into one of your veins.  You will be asked to lie on an exam table. This table will slide in and out of the CT machine during the procedure.  Contrast dye will be injected into the IV tube. You might feel warm, or you may get a metallic taste in your mouth.  The table that you are lying on will move into the CT machine tunnel for the scan.  The person running the machine will give you instructions while the scans are being done. You may be asked to: ? Keep your arms above your head. ? Hold your breath. ? Stay very still, even if the table is moving.  When the scanning is complete, you will be moved out of the machine.  The IV tube will be removed. The procedure may vary among health care providers and hospitals. What happens after the procedure?  You might feel warm, or you may get a metallic taste in your mouth.  You may be asked to drink water or other fluids to wash (flush) the contrast material out of your body.  It is up to you to get the results of your procedure. Ask your health care provider, or the department that is doing the procedure, when your results will be ready. Summary  A CT angiogram is a procedure to look at the blood vessels in various areas of the body.  You will need to stay very still during the  exam.  You may be asked to drink water or other fluids to wash (flush) the contrast material out of your body after your scan. This information is not intended to replace advice given to you by your health care provider. Make sure you discuss any questions you have with your health care provider. Document Revised: 12/06/2018 Document Reviewed: 05/26/2016 Elsevier Patient Education  [place].

## 2020-04-28 NOTE — Progress Notes (Signed)
Patient Care Team: Leonel Ramsay, MD as PCP - General (Infectious Diseases) Minna Merritts, MD as PCP - Cardiology (Cardiology) Deboraha Sprang, MD as PCP - Electrophysiology (Cardiology) Alisa Graff, FNP as Nurse Practitioner (Cardiology) Minna Merritts, MD as Consulting Physician (Cardiology) Anabel Bene, MD as Referring Physician (Neurology)   HPI  Matthew Howe is a 74 y.o. male Seen following pacemaker implantation 2/16 by Dr. Greggory Brandy . He had presented to [facility] with chest pain and shortness of breath. He was found to be in complete heart block.  Also atrial standstill. Persistence of atrial fibrillation assoc with SOB Cardioversion was accomplished.   Rx with flecainide   \   Reprogramming at his last visit was associated with improved exercise performance.  Has lost about 25 pounds.  This is also contributing to improved exercise performance.  Quite active.  No dyspnea or chest pain.  No edema.  Acute  Tremor about the same           Catheterization 2/14 had demonstrated mild nonobstructive disease. DATE TEST EF   10/16 echo   55-60 %   3/18 Echo   55-60 %   6/20 Echo  55-60  Asc Ao 3.9 LVH mild LAE (-/53/38)     Date Cr K Hgb  2/18 1.36  14.7  1/19  0.38 4.5 13.3  3/19   12.4  12/19 2.29 4.4 10.7  5/20 2.0 4.3 13.2  8/20 2.3    12/20 1.53 4.4 13.8  6/21 1.6 4.4 14.6         Past Medical History:  Diagnosis Date  . Atrial fibrillation ([place])   . Atrial fibrillation ([place])   . Carpal tunnel syndrome on both sides   . CHF (congestive heart failure) ([place])   . CKD (chronic kidney disease) 10/2018   recent diagnosis  . Complete heart block ([place])    a. s/p St. Jude PPM 11/2014.  [name] Complication of anesthesia    stayed overnight dt under too long d/t taking Parkinson's medication (2013?)  . Coronary artery disease   . GERD (gastroesophageal reflux disease)    "not as bad as it used to be"  . Heart murmur   . History of  recent blood transfusion 07/2018  . Hyperlipidemia   . Hypertension   . Lower extremity edema   . Parkinson's disease ([place])   . Presence of permanent cardiac pacemaker    St Jude  . Shortness of breath dyspnea     Past Surgical History:  Procedure Laterality Date  . APPENDECTOMY  12/2010  . BACK SURGERY  08/06/2018   lower lumbarsacral region, has rods in place. (done at [facility])  . CARDIAC CATHETERIZATION  11/20/2012   Known CAD with one vessel coronary disease of left descending artery by cardiac cath.   [name] CARDIOVERSION N/A 11/21/2016   Procedure: CARDIOVERSION;  Surgeon: Deboraha Sprang, MD;  Location: [facility];  Service: Cardiovascular;  Laterality: N/A;  . CARDIOVERSION N/A 03/08/2019   Procedure: CARDIOVERSION;  Surgeon: Fay Records, MD;  Location: [facility] ENDOSCOPY;  Service: Cardiovascular;  Laterality: N/A;  . CARPAL TUNNEL RELEASE Right 10/22/2018   Procedure: CARPAL TUNNEL RELEASE;  Surgeon: Dereck Leep, MD;  Location: [facility];  Service: Orthopedics;  Laterality: Right;  . CARPAL TUNNEL RELEASE Left 05/22/2019   Procedure: CARPAL TUNNEL RELEASE - LEFT;  Surgeon: Dereck Leep, MD;  Location: [facility]S;  Service: Orthopedics;  Laterality: Left;  .  COLONOSCOPY  2018  . HERNIA REPAIR    . INSERT / REPLACE / REMOVE PACEMAKER    . PACEMAKER INSERTION  11-21-14   STJ Assurity dual chamber pacemaker implanted by Dr Rayann Heman for CHB  . PERMANENT PACEMAKER INSERTION N/A 11/21/2014   Procedure: PERMANENT PACEMAKER INSERTION;  Surgeon: Thompson Grayer, MD;  Location: [facility] CATH LAB;  Service: Cardiovascular;  Laterality: N/A;  . TEE WITHOUT CARDIOVERSION N/A 11/21/2016   Procedure: TRANSESOPHAGEAL ECHOCARDIOGRAM (TEE);  Surgeon: Deboraha Sprang, MD;  Location: [facility]S;  Service: Cardiovascular;  Laterality: N/A;  . UPPER GI ENDOSCOPY      Current Outpatient Medications  Medication Sig Dispense Refill  . acetaminophen (TYLENOL) 500 MG tablet Take 500 mg by mouth every 6 (six) hours as  needed (back pain.).     [name] allopurinol (ZYLOPRIM) 100 MG tablet Take 100 mg by mouth daily.    [name] allopurinol (ZYLOPRIM) 300 MG tablet Take 300 mg by mouth daily.  3  . bisacodyl (DULCOLAX) 5 MG EC tablet Take 5 mg by mouth 2 (two) times daily.     . carboxymethylcellulose (REFRESH PLUS) 0.5 % SOLN Place 1 drop into both eyes at bedtime.     . carvedilol (COREG) 12.5 MG tablet TAKE 0.5 TABLETS (6.25 MG TOTAL) BY MOUTH 2 (TWO) TIMES DAILY. 90 tablet 3  . desloratadine (CLARINEX) 5 MG tablet Take 5 mg by mouth at bedtime.     [name] ELIQUIS 5 MG TABS tablet TAKE 1 TABLET BY MOUTH TWICE A DAY 180 tablet 2  . flecainide (TAMBOCOR) 100 MG tablet TAKE 1 TABLET BY MOUTH TWICE A DAY 180 tablet 0  . fluticasone (FLONASE) 50 MCG/ACT nasal spray Place 2 sprays into the nose daily as needed for allergies.     [name] lansoprazole (PREVACID) 15 MG capsule Take 15 mg by mouth daily as needed (indigestion).     . magnesium oxide (MAG-OX) 400 MG tablet Take 400 mg by mouth at bedtime.    . meloxicam (MOBIC) 15 MG tablet Take 15 mg by mouth 2 (two) times a week. Mondays & Thursdays    . metolazone (ZAROXOLYN) 2.5 MG tablet TAKE 1 TABLET BY MOUTH DAILY AS NEEDED (ONCE DAILY AS NEEDED FOR WEIGHT GAIN OR SWELLING.). 90 tablet 1  . Multiple Vitamins-Minerals (OCUVITE ADULT 50+ PO) Take 1 tablet by mouth daily.    . polyethylene glycol (MIRALAX / GLYCOLAX) packet Take 17 g by mouth daily as needed (constipation.).     [name] potassium chloride (KLOR-CON) 10 MEQ tablet TAKE 3 TABLETS (30 MEQ TOTAL) BY MOUTH DAILY. 270 tablet 1  . pramipexole (MIRAPEX) 0.75 MG tablet Take 0.75 mg by mouth 3 (three) times daily.    . Red Yeast Rice 600 MG CAPS Take 600 mg by mouth daily.    . selegiline (ELDEPRYL) 5 MG capsule Take 5 mg by mouth 2 (two) times daily with a meal.     . SENEXON-S 8.6-50 MG tablet Take 2 tablets by mouth 2 (two) times daily.    [name] torsemide (DEMADEX) 20 MG tablet TAKE 3 TABLETS (60 MG TOTAL) BY MOUTH DAILY. TAKE 3 TABLET (60  MG) BY MOUTH ONCE DAILY 270 tablet 1   No current facility-administered medications for this visit.    Allergies  Allergen Reactions  . Linzess [Linaclotide] Shortness Of Breath  . Codeine Diarrhea and Nausea And Vomiting  . Oxycodone Nausea And Vomiting  . Terazosin     Dizziness / altered mental state   . Amitiza [  Lubiprostone] Other (See Comments)    Abdominal cramps  . Gabapentin Other (See Comments)    dizziness  . Norco [Hydrocodone-Acetaminophen] Diarrhea and Nausea And Vomiting  . Omeprazole Diarrhea and Nausea And Vomiting  . Requip [Ropinirole] Nausea Only    Review of Systems negative except from HPI and PMH  Physical Exam BP 126/70 (BP Location: Left Arm, Patient Position: Sitting, Cuff Size: Normal)   Pulse 60   Ht 6\' 1"  (1.854 m)   Wt 262 lb (118.8 kg)   SpO2 94%   BMI 34.57 kg/m  Well developed and well nourished in no acute distress HENT normal Neck supple with JVP-flat Clear Device pocket well healed; without hematoma or erythema.  There is no tethering  Regular rate and rhythm,  murmur Abd-soft with active BS No Clubbing cyanosis  edema Skin-warm and dry A & Oriented  Grossly normal sensory and motor function  ECG AV pacing at 60 Interval 20/22/58   Assessment and  Plan  Complete heart block  Sinus arrest  Atrial fibrillation-persistent  Pacemaker-St. Jude  The patient's device was interrogated.  The information was reviewed. No changes were made in the programming.     Parkinson's disease  Hypertension  Hypertensive heart disease   Renal insuff gd 3  HFpEF  Aortic root Dilitation  Obesity     No interval atrial fibrillation.    No bleeding on anticoagulation.    Blood pressure well controlled.    Broad QRS but LV function stable  Has been losing weight  Encouraged to continue        Holding sinus on flecainide  On Anticoagulation;  No bleeding issues  Discussed obesity and weight loss strategies, diet and  exercise  BP well controlled  We spent more than 50% of our >20 min visit in face to face counseling regarding the above

## 2020-04-29 LAB — BASIC METABOLIC PANEL
BUN/Creatinine Ratio: 20 (ref 10–24)
BUN: 36 mg/dL — ABNORMAL HIGH (ref 8–27)
CO2: 24 mmol/L (ref 20–29)
Calcium: 9.1 mg/dL (ref 8.6–10.2)
Chloride: 104 mmol/L (ref 96–106)
Creatinine, Ser: 1.79 mg/dL — ABNORMAL HIGH (ref 0.76–1.27)
GFR calc Af Amer: 43 mL/min/{1.73_m2} — ABNORMAL LOW (ref >59)
GFR calc non Af Amer: 37 mL/min/{1.73_m2} — ABNORMAL LOW (ref >59)
Glucose: 107 mg/dL — ABNORMAL HIGH (ref 65–99)
Potassium: 4.9 mmol/L (ref 3.5–5.2)
Sodium: 143 mmol/L (ref 134–144)

## 2020-05-05 ENCOUNTER — Ambulatory Visit (INDEPENDENT_AMBULATORY_CARE_PROVIDER_SITE_OTHER): Payer: Medicare Other | Admitting: *Deleted

## 2020-05-05 DIAGNOSIS — I4819 Other persistent atrial fibrillation: Secondary | ICD-10-CM

## 2020-05-06 LAB — CUP PACEART REMOTE DEVICE CHECK
Battery Remaining Longevity: 113 mo
Battery Remaining Percentage: 95.5 %
Battery Voltage: 2.96 V
Brady Statistic AP VP Percent: 99 %
Brady Statistic AP VS Percent: 1 %
Brady Statistic AS VP Percent: 1 %
Brady Statistic AS VS Percent: 0 %
Brady Statistic RA Percent Paced: 99 %
Brady Statistic RV Percent Paced: 99 %
Date Time Interrogation Session: 20210727021254
Implantable Lead Implant Date: 20160212
Implantable Lead Implant Date: 20160212
Implantable Lead Location: 753859
Implantable Lead Location: 753860
Implantable Lead Model: 1948
Implantable Pulse Generator Implant Date: 20160212
Lead Channel Impedance Value: 490 Ohm
Lead Channel Impedance Value: 580 Ohm
Lead Channel Pacing Threshold Amplitude: 0.625 V
Lead Channel Pacing Threshold Amplitude: 1.5 V
Lead Channel Pacing Threshold Pulse Width: 0.4 ms
Lead Channel Pacing Threshold Pulse Width: 0.4 ms
Lead Channel Sensing Intrinsic Amplitude: 12 mV
Lead Channel Sensing Intrinsic Amplitude: 3.5 mV
Lead Channel Setting Pacing Amplitude: 1.625
Lead Channel Setting Pacing Amplitude: 1.75 V
Lead Channel Setting Pacing Pulse Width: 0.4 ms
Lead Channel Setting Sensing Sensitivity: 4 mV
Pulse Gen Model: 2240
Pulse Gen Serial Number: 3049931

## 2020-05-07 ENCOUNTER — Ambulatory Visit
Admission: RE | Admit: 2020-05-07 | Discharge: 2020-05-07 | Disposition: A | Discharge status: Home / Self Care | Payer: Medicare Other | Source: Ambulatory Visit | Attending: Internal Medicine | Admitting: Internal Medicine

## 2020-05-07 ENCOUNTER — Other Ambulatory Visit: Payer: Self-pay

## 2020-05-07 DIAGNOSIS — I77810 Thoracic aortic ectasia: Secondary | ICD-10-CM | POA: Insufficient documentation

## 2020-05-07 LAB — POCT I-STAT CREATININE: Creatinine, Ser: 2.1 mg/dL — ABNORMAL HIGH (ref 0.61–1.24)

## 2020-05-07 MED ORDER — IOHEXOL 350 MG/ML SOLN
60.0000 mL | Freq: Once | INTRAVENOUS | Status: AC | PRN
  Administered 2020-05-07: 60 mL via INTRAVENOUS

## 2020-05-08 NOTE — Progress Notes (Signed)
Remote pacemaker transmission.   

## 2020-05-13 MED ORDER — TORSEMIDE 20 MG PO TABS: 60.0000 mg | ORAL_TABLET | Freq: Every day | ORAL | 1 refills | Status: DC

## 2020-05-13 MED ORDER — POTASSIUM CHLORIDE ER 10 MEQ PO TBCR: EXTENDED_RELEASE_TABLET | ORAL | 1 refills | Status: DC

## 2020-07-08 ENCOUNTER — Other Ambulatory Visit: Payer: Self-pay | Admitting: Internal Medicine

## 2020-07-08 NOTE — Telephone Encounter (Signed)
This is a [place] pt 

## 2020-07-11 ENCOUNTER — Other Ambulatory Visit: Payer: Self-pay | Admitting: Internal Medicine

## 2020-07-13 NOTE — Telephone Encounter (Signed)
Pt's age 74, wt 118.8 kg, SCr 1.79, CrCl 61.76, last ov w/ SK 04/28/20.

## 2020-07-17 ENCOUNTER — Telehealth: Payer: Self-pay | Admitting: Cardiovascular Disease

## 2020-07-17 NOTE — Telephone Encounter (Signed)
   [facility] Pre-operative Risk Assessment    HEARTCARE STAFF: - Please ensure there is not already an duplicate clearance open for this procedure. - Under Visit Info/Reason for Call, type in Other and utilize the format Clearance MM/DD/YY or Clearance TBD. Do not use dashes or single digits. - If request is for dental extraction, please clarify the # of teeth to be extracted.  Request for surgical clearance:  1. What type of surgery is being performed? Colonoscopy or upper endoscopy    2. When is this surgery scheduled? 10-21-20    3. What type of clearance is required (medical clearance vs. Pharmacy clearance to hold med vs. Both)? both  4. Are there any medications that need to be held prior to surgery and how long?please advise    5. Practice name and name of physician performing surgery? [facility]   6. What is the office phone number? [phone]   7.   What is the office fax number? [phone]  8.   Anesthesia type (None, local, MAC, general) ? Monitored    Clarisse Gouge 07/17/2020, 3:10 PM  _________________________________________________________________   (provider comments below)

## 2020-07-17 NOTE — Telephone Encounter (Signed)
Patient with diagnosis of afib on Eliquis for anticoagulation.    Procedure: Colonoscopy or upper endoscopy   Date of procedure: 10/21/20  CHADS2-VASc score of  4 (CHF, HTN, AGE, CAD)  CrCl 49.6 ml/min  Per office protocol, patient can hold Eliquis for 1-2 days prior to procedure.    Since this clearance is for a procedure several months out, if patients clinical situation changes, he will need to be reevaluated.

## 2020-07-17 NOTE — Telephone Encounter (Signed)
Left voice mail to call back 

## 2020-07-21 NOTE — Telephone Encounter (Signed)
   Primary Cardiologist: Ida Rogue, MD  Chart reviewed as part of pre-operative protocol coverage. Given past medical history and time since last visit, based on ACC/AHA guidelines, Matthew Howe would be at acceptable risk for the planned procedure without further cardiovascular testing.   Patient with diagnosis of afib on Eliquis for anticoagulation.    Procedure: Colonoscopy or upper endoscopy Date of procedure: 10/21/20  CHADS2-VASc score of  4 (CHF, HTN, AGE, CAD)  CrCl 49.6 ml/min  Per office protocol, patient can hold Eliquis for 1-2 days prior to procedure.    Since this clearance is for a procedure several months out, if patients clinical situation changes, he will need to be reevaluated.  Patient was advised that if he develops new symptoms prior to surgery to contact our office to arrange a follow-up appointment.  He verbalized understanding.  I will route this recommendation to the requesting party via Epic fax function and remove from pre-op pool.  Please call with questions.   [name] NP-C    07/21/2020, 11:55 AM [address] Office [phone] Fax [phone]

## 2020-07-21 NOTE — Telephone Encounter (Signed)
Pt's wife returning call.

## 2020-08-01 ENCOUNTER — Other Ambulatory Visit: Payer: Self-pay | Admitting: Cardiovascular Disease

## 2020-08-02 ENCOUNTER — Other Ambulatory Visit: Payer: Self-pay | Admitting: Internal Medicine

## 2020-08-04 ENCOUNTER — Ambulatory Visit (INDEPENDENT_AMBULATORY_CARE_PROVIDER_SITE_OTHER): Payer: Medicare Other

## 2020-08-04 DIAGNOSIS — I442 Atrioventricular block, complete: Secondary | ICD-10-CM

## 2020-08-04 LAB — CUP PACEART REMOTE DEVICE CHECK
Battery Remaining Longevity: 114 mo
Battery Remaining Percentage: 95.5 %
Battery Voltage: 2.96 V
Brady Statistic AP VP Percent: 99 %
Brady Statistic AP VS Percent: 1 %
Brady Statistic AS VP Percent: 1 %
Brady Statistic AS VS Percent: 1 %
Brady Statistic RA Percent Paced: 99 %
Brady Statistic RV Percent Paced: 99 %
Date Time Interrogation Session: 20211026020007
Implantable Lead Implant Date: 20160212
Implantable Lead Implant Date: 20160212
Implantable Lead Location: 753859
Implantable Lead Location: 753860
Implantable Lead Model: 1948
Implantable Pulse Generator Implant Date: 20160212
Lead Channel Impedance Value: 510 Ohm
Lead Channel Impedance Value: 580 Ohm
Lead Channel Pacing Threshold Amplitude: 0.625 V
Lead Channel Pacing Threshold Amplitude: 1.375 V
Lead Channel Pacing Threshold Pulse Width: 0.4 ms
Lead Channel Pacing Threshold Pulse Width: 0.4 ms
Lead Channel Sensing Intrinsic Amplitude: 12 mV
Lead Channel Sensing Intrinsic Amplitude: 3.5 mV
Lead Channel Setting Pacing Amplitude: 1.625
Lead Channel Setting Pacing Amplitude: 1.625
Lead Channel Setting Pacing Pulse Width: 0.4 ms
Lead Channel Setting Sensing Sensitivity: 4 mV
Pulse Gen Model: 2240
Pulse Gen Serial Number: 3049931

## 2020-08-10 NOTE — Progress Notes (Signed)
Remote pacemaker transmission.   

## 2020-09-29 ENCOUNTER — Other Ambulatory Visit: Payer: Self-pay

## 2020-09-29 MED ORDER — TORSEMIDE 20 MG PO TABS: 60.0000 mg | ORAL_TABLET | Freq: Every day | ORAL | 0 refills | Status: DC

## 2020-10-16 ENCOUNTER — Other Ambulatory Visit
Admission: RE | Admit: 2020-10-16 | Discharge: 2020-10-16 | Disposition: A | Discharge status: Home / Self Care | Payer: Medicare Other | Source: Ambulatory Visit | Attending: General Surgery | Admitting: General Surgery

## 2020-10-16 ENCOUNTER — Other Ambulatory Visit: Payer: Self-pay

## 2020-10-16 DIAGNOSIS — Z20822 Contact with and (suspected) exposure to covid-19: Secondary | ICD-10-CM | POA: Diagnosis not present

## 2020-10-16 DIAGNOSIS — Z01818 Encounter for other preprocedural examination: Secondary | ICD-10-CM | POA: Insufficient documentation

## 2020-10-16 LAB — SARS CORONAVIRUS 2 (TAT 6-24 HRS): SARS Coronavirus 2: NEGATIVE

## 2020-10-20 ENCOUNTER — Encounter: Payer: Self-pay | Admitting: General Surgery

## 2020-10-21 ENCOUNTER — Ambulatory Visit
Admission: RE | Admit: 2020-10-21 | Discharge: 2020-10-21 | Disposition: A | Discharge status: Home / Self Care | Payer: Medicare Other | Attending: General Surgery | Admitting: General Surgery

## 2020-10-21 ENCOUNTER — Other Ambulatory Visit: Payer: Self-pay

## 2020-10-21 ENCOUNTER — Encounter: Payer: Self-pay | Admitting: General Surgery

## 2020-10-21 ENCOUNTER — Ambulatory Visit: Payer: Medicare Other | Admitting: Anesthesiology

## 2020-10-21 ENCOUNTER — Encounter
Admission: RE | Disposition: A | Discharge status: Home / Self Care | Payer: Self-pay | Source: Home / Self Care | Attending: General Surgery

## 2020-10-21 DIAGNOSIS — Z7901 Long term (current) use of anticoagulants: Secondary | ICD-10-CM | POA: Insufficient documentation

## 2020-10-21 DIAGNOSIS — D123 Benign neoplasm of transverse colon: Secondary | ICD-10-CM | POA: Insufficient documentation

## 2020-10-21 DIAGNOSIS — Z888 Allergy status to other drugs, medicaments and biological substances status: Secondary | ICD-10-CM | POA: Insufficient documentation

## 2020-10-21 DIAGNOSIS — Z1211 Encounter for screening for malignant neoplasm of colon: Secondary | ICD-10-CM | POA: Insufficient documentation

## 2020-10-21 DIAGNOSIS — Z885 Allergy status to narcotic agent status: Secondary | ICD-10-CM | POA: Insufficient documentation

## 2020-10-21 DIAGNOSIS — Z791 Long term (current) use of non-steroidal anti-inflammatories (NSAID): Secondary | ICD-10-CM | POA: Diagnosis not present

## 2020-10-21 DIAGNOSIS — Z79899 Other long term (current) drug therapy: Secondary | ICD-10-CM | POA: Insufficient documentation

## 2020-10-21 DIAGNOSIS — Z86010 Personal history of colonic polyps: Secondary | ICD-10-CM | POA: Diagnosis present

## 2020-10-21 DIAGNOSIS — D122 Benign neoplasm of ascending colon: Secondary | ICD-10-CM | POA: Diagnosis not present

## 2020-10-21 DIAGNOSIS — K5730 Diverticulosis of large intestine without perforation or abscess without bleeding: Secondary | ICD-10-CM | POA: Insufficient documentation

## 2020-10-21 HISTORY — DX: Unspecified osteoarthritis, unspecified site: M19.90

## 2020-10-21 HISTORY — PX: COLONOSCOPY: SHX5424

## 2020-10-21 HISTORY — DX: Irritable bowel syndrome, unspecified: K58.9

## 2020-10-21 SURGERY — COLONOSCOPY
Anesthesia: General

## 2020-10-21 MED ORDER — PROPOFOL 10 MG/ML IV BOLUS
INTRAVENOUS | Status: DC | PRN
  Administered 2020-10-21: 50 mg via INTRAVENOUS

## 2020-10-21 MED ORDER — LIDOCAINE HCL (PF) 2 % IJ SOLN
INTRAMUSCULAR | Status: AC
  Filled 2020-10-21: qty 5

## 2020-10-21 MED ORDER — PROPOFOL 500 MG/50ML IV EMUL
INTRAVENOUS | Status: DC | PRN
  Administered 2020-10-21: 150 ug/kg/min via INTRAVENOUS

## 2020-10-21 MED ORDER — PROPOFOL 500 MG/50ML IV EMUL
INTRAVENOUS | Status: AC
  Filled 2020-10-21: qty 50

## 2020-10-21 MED ORDER — PHENYLEPHRINE HCL (PRESSORS) 10 MG/ML IV SOLN
INTRAVENOUS | Status: DC | PRN
  Administered 2020-10-21: 100 ug via INTRAVENOUS

## 2020-10-21 MED ORDER — GLYCOPYRROLATE 0.2 MG/ML IJ SOLN
INTRAMUSCULAR | Status: AC
  Filled 2020-10-21: qty 1

## 2020-10-21 MED ORDER — PHENYLEPHRINE HCL (PRESSORS) 10 MG/ML IV SOLN
INTRAVENOUS | Status: AC
  Filled 2020-10-21: qty 1

## 2020-10-21 MED ORDER — SODIUM CHLORIDE 0.9 % IV SOLN: INTRAVENOUS | Status: DC

## 2020-10-21 MED ORDER — APIXABAN 5 MG PO TABS: 5.0000 mg | ORAL_TABLET | Freq: Two times a day (BID) | ORAL | 0 refills | Status: DC

## 2020-10-21 MED ORDER — LIDOCAINE HCL (CARDIAC) PF 100 MG/5ML IV SOSY
PREFILLED_SYRINGE | INTRAVENOUS | Status: DC | PRN
  Administered 2020-10-21: 50 mg via INTRAVENOUS

## 2020-10-21 NOTE — Op Note (Signed)
[facility] Patient Name: Matthew Howe Procedure Date: 10/21/2020 7:39 AM MRN: [id number] Account #: [id number] Date of Birth: Sep 21, 1946 Admit Type: Outpatient Age: 75 Room: [facility] ENDO ROOM 1 Gender: Male Note Status: Finalized Procedure:             Colonoscopy Indications:           High risk colon cancer surveillance: Personal history                         of colonic polyps Providers:             Robert Bellow, MD Referring MD:          Adrian Prows (Referring MD) Medicines:             Monitored Anesthesia Care Complications:         No immediate complications. Procedure:             Pre-Anesthesia Assessment:                        - Prior to the procedure, a History and Physical was                         performed, and patient medications, allergies and                         sensitivities were reviewed. The patient's tolerance                         of previous anesthesia was reviewed.                        - The risks and benefits of the procedure and the                         sedation options and risks were discussed with the                         patient. All questions were answered and informed                         consent was obtained.                        After obtaining informed consent, the colonoscope was                         passed under direct vision. Throughout the procedure,                         the patient's blood pressure, pulse, and oxygen                         saturations were monitored continuously. The                         Colonoscope was introduced through the anus and                         advanced to the the cecum, identified by  appendiceal                         orifice and ileocecal valve. The colonoscopy was                         performed without difficulty. The patient tolerated                         the procedure well. The quality of the bowel                          preparation was good. Findings:      Three sessile polyps were found in the ascending colon and mid ascending       colon. The polyps were 4 to 8 mm in size. These polyps were removed with       a hot snare. Resection and retrieval were complete.      A 8 mm polyp was found in the hepatic flexure. The polyp was sessile.       The polyp was removed with a hot snare. Resection and retrieval were       complete.      A 8 mm polyp was found in the distal transverse colon. The polyp was       sessile. The polyp was removed with a hot snare. Resection and retrieval       were complete.      Multiple medium-mouthed diverticula were found in the recto-sigmoid       colon and sigmoid colon.      The retroflexed view of the distal rectum and anal verge was normal and       showed no anal or rectal abnormalities. Impression:            - Three 4 to 8 mm polyps in the ascending colon and in                         the mid ascending colon, removed with a hot snare.                         Resected and retrieved.                        - One 8 mm polyp at the hepatic flexure, removed with                         a hot snare. Resected and retrieved.                        - One 8 mm polyp in the distal transverse colon,                         removed with a hot snare. Resected and retrieved.                        - Diverticulosis in the recto-sigmoid colon and in the                         sigmoid colon.                        -  The distal rectum and anal verge are normal on                         retroflexion view. Recommendation:        - Repeat colonoscopy in 3 years for surveillance.                        - Telephone endoscopist for pathology results in 1                         week. Procedure Code(s):     --- Professional ---                        [phone], Colonoscopy, flexible; with removal of                         tumor(s), polyp(s), or other lesion(s) by snare                          technique Diagnosis Code(s):     --- Professional ---                        K63.5, Polyp of colon                        Z86.010, Personal history of colonic polyps                        K57.30, Diverticulosis of large intestine without                         perforation or abscess without bleeding CPT copyright 2019 American Medical Association. All rights reserved. The codes documented in this report are preliminary and upon coder review may  be revised to meet current compliance requirements. Robert Bellow, MD 10/21/2020 8:18:32 AM This report has been signed electronically. Number of Addenda: 0 Note Initiated On: 10/21/2020 7:39 AM Scope Withdrawal Time: 0 hours 19 minutes 16 seconds  Total Procedure Duration: 0 hours 29 minutes 46 seconds  Estimated Blood Loss:  Estimated blood loss: none.      [facility]

## 2020-10-21 NOTE — Anesthesia Procedure Notes (Signed)
Date/Time: 10/21/2020 7:41 AM Performed by: Johnna Acosta, CRNA Pre-anesthesia Checklist: Patient identified, Emergency Drugs available, Suction available, Patient being monitored and Timeout performed Patient Re-evaluated:Patient Re-evaluated prior to induction Oxygen Delivery Method: Nasal cannula Preoxygenation: Pre-oxygenation with 100% oxygen Induction Type: IV induction

## 2020-10-21 NOTE — Anesthesia Postprocedure Evaluation (Signed)
Anesthesia Post Note  Patient: Matthew Howe  Procedure(s) Performed: COLONOSCOPY (N/A )  Patient location during evaluation: Endoscopy Anesthesia Type: General Level of consciousness: awake and alert Pain management: pain level controlled Vital Signs Assessment: post-procedure vital signs reviewed and stable Respiratory status: spontaneous breathing, nonlabored ventilation, respiratory function stable and patient connected to nasal cannula oxygen Cardiovascular status: blood pressure returned to baseline and stable Postop Assessment: no apparent nausea or vomiting Anesthetic complications: no   No complications documented.   Last Vitals:  Vitals:   10/21/20 0842 10/21/20 0852  BP: 126/70 130/69  Pulse: 60 60  Resp: 13 16  Temp:    SpO2: 97% 100%    Last Pain:  Vitals:   10/21/20 0852  TempSrc:   PainSc: 0-No pain                 [name]

## 2020-10-21 NOTE — Transfer of Care (Signed)
Immediate Anesthesia Transfer of Care Note  Patient: Matthew Howe  Procedure(s) Performed: COLONOSCOPY (N/A )  Patient Location: PACU  Anesthesia Type:General  Level of Consciousness: sedated  Airway & Oxygen Therapy: Patient Spontanous Breathing and Patient connected to nasal cannula oxygen  Post-op Assessment: Report given to RN and Post -op Vital signs reviewed and stable  Post vital signs: Reviewed and stable  Last Vitals:  Vitals Value Taken Time  BP 90/53 10/21/20 0822  Temp    Pulse 65 10/21/20 0822  Resp 10 10/21/20 0822  SpO2 95 % 10/21/20 0822    Last Pain:  Vitals:   10/21/20 0820  TempSrc: Temporal  PainSc:          Complications: No complications documented.

## 2020-10-21 NOTE — H&P (Signed)
Matthew Howe [id number] May 09, 1946     HPI:   Past history multiple right sided polyps.  For follow up colonoscopy. Tolerated prep well.   Medications Prior to Admission  Medication Sig Dispense Refill Last Dose  . allopurinol (ZYLOPRIM) 100 MG tablet Take 100 mg by mouth daily.   10/20/2020 at Unknown time  . allopurinol (ZYLOPRIM) 300 MG tablet Take 300 mg by mouth daily.  3 10/20/2020 at Unknown time  . bisacodyl (DULCOLAX) 5 MG EC tablet Take 5 mg by mouth 2 (two) times daily.   10/20/2020 at Unknown time  . carvedilol (COREG) 12.5 MG tablet TAKE 0.5 TABLETS (6.25 MG TOTAL) BY MOUTH 2 (TWO) TIMES DAILY. 90 tablet 3 10/21/2020 at 0430  . desloratadine (CLARINEX) 5 MG tablet Take 5 mg by mouth at bedtime.    10/20/2020 at Unknown time  . flecainide (TAMBOCOR) 100 MG tablet TAKE 1 TABLET BY MOUTH TWICE A DAY 180 tablet 0 10/21/2020 at Unknown time  . magnesium oxide (MAG-OX) 400 MG tablet Take 400 mg by mouth at bedtime.   10/20/2020 at Unknown time  . meloxicam (MOBIC) 15 MG tablet Take 15 mg by mouth 2 (two) times a week. Mondays & Thursdays   10/20/2020 at Unknown time  . Multiple Vitamins-Minerals (OCUVITE ADULT 50+ PO) Take 1 tablet by mouth daily.   10/20/2020 at Unknown time  . potassium chloride (KLOR-CON) 10 MEQ tablet TAKE 3 TABLETS (30 MEQ TOTAL) BY MOUTH DAILY. 270 tablet 1 10/20/2020 at Unknown time  . pramipexole (MIRAPEX) 0.75 MG tablet Take 0.75 mg by mouth 3 (three) times daily.   10/21/2020 at 0430  . Red Yeast Rice 600 MG CAPS Take 600 mg by mouth daily.   10/20/2020 at Unknown time  . selegiline (ELDEPRYL) 5 MG capsule Take 5 mg by mouth 2 (two) times daily with a meal.    10/21/2020 at 0430  . SENEXON-S 8.6-50 MG tablet Take 2 tablets by mouth 2 (two) times daily.   10/20/2020 at Unknown time  . torsemide (DEMADEX) 20 MG tablet Take 3 tablets (60 mg total) by mouth daily. 270 tablet 0 10/20/2020 at Unknown time  . acetaminophen (TYLENOL) 500 MG tablet Take 500 mg by mouth every 6 (six)  hours as needed (back pain.).      [name] carboxymethylcellulose (REFRESH PLUS) 0.5 % SOLN Place 1 drop into both eyes at bedtime.      [name] ELIQUIS 5 MG TABS tablet TAKE 1 TABLET BY MOUTH TWICE A DAY 180 tablet 1 10/19/2020  . fluticasone (FLONASE) 50 MCG/ACT nasal spray Place 2 sprays into the nose daily as needed for allergies.      [name] lansoprazole (PREVACID) 15 MG capsule Take 15 mg by mouth daily as needed (indigestion).      . metolazone (ZAROXOLYN) 2.5 MG tablet TAKE 1 TABLET BY MOUTH DAILY AS NEEDED (ONCE DAILY AS NEEDED FOR WEIGHT GAIN OR SWELLING.). 90 tablet 1   . polyethylene glycol (MIRALAX / GLYCOLAX) packet Take 17 g by mouth daily as needed (constipation.).       Allergies  Allergen Reactions  . Linzess [Linaclotide] Shortness Of Breath  . Codeine Diarrhea and Nausea And Vomiting  . Oxycodone Nausea And Vomiting  . Terazosin     Dizziness / altered mental state   . Amitiza [Lubiprostone] Other (See Comments)    Abdominal cramps  . Gabapentin Other (See Comments)    dizziness  . Norco [Hydrocodone-Acetaminophen] Diarrhea and Nausea And Vomiting  . Omeprazole Diarrhea  and Nausea And Vomiting  . Requip [Ropinirole] Nausea Only   Past Medical History:  Diagnosis Date  . Arthritis   . Atrial fibrillation ([place])   . Atrial fibrillation ([place])   . Carpal tunnel syndrome on both sides   . CHF (congestive heart failure) ([place])   . CKD (chronic kidney disease) 10/2018   recent diagnosis  . Complete heart block ([place])    a. s/p St. Jude PPM 11/2014.  [name] Complication of anesthesia    stayed overnight dt under too long d/t taking Parkinson's medication (2013?)  . Coronary artery disease   . GERD (gastroesophageal reflux disease)    "not as bad as it used to be"  . Heart murmur   . History of recent blood transfusion 07/2018  . Hyperlipidemia   . Hypertension   . IBS (irritable bowel syndrome)   . Lower extremity edema   . Parkinson's disease ([place])   . Presence of permanent cardiac  pacemaker    St Jude  . Shortness of breath dyspnea    Past Surgical History:  Procedure Laterality Date  . APPENDECTOMY  12/2010  . BACK SURGERY  08/06/2018   lower lumbarsacral region, has rods in place. (done at [facility])  . CARDIAC CATHETERIZATION  11/20/2012   Known CAD with one vessel coronary disease of left descending artery by cardiac cath.   [name] CARDIOVERSION N/A 11/21/2016   Procedure: CARDIOVERSION;  Surgeon: Deboraha Sprang, MD;  Location: [place] [facility];  Service: Cardiovascular;  Laterality: N/A;  . CARDIOVERSION N/A 03/08/2019   Procedure: CARDIOVERSION;  Surgeon: Fay Records, MD;  Location: [facility] ENDOSCOPY;  Service: Cardiovascular;  Laterality: N/A;  . CARPAL TUNNEL RELEASE Right 10/22/2018   Procedure: CARPAL TUNNEL RELEASE;  Surgeon: Dereck Leep, MD;  Location: [facility]S;  Service: Orthopedics;  Laterality: Right;  . CARPAL TUNNEL RELEASE Left 05/22/2019   Procedure: CARPAL TUNNEL RELEASE - LEFT;  Surgeon: Dereck Leep, MD;  Location: [facility];  Service: Orthopedics;  Laterality: Left;  . COLONOSCOPY  2018  . HERNIA REPAIR  [id number]   umbilical hernia repair  . INSERT / REPLACE / REMOVE PACEMAKER    . PACEMAKER INSERTION  11-21-14   STJ Assurity dual chamber pacemaker implanted by Dr Rayann Heman for CHB  . PERMANENT PACEMAKER INSERTION N/A 11/21/2014   Procedure: PERMANENT PACEMAKER INSERTION;  Surgeon: Thompson Grayer, MD;  Location: [facility] CATH LAB;  Service: Cardiovascular;  Laterality: N/A;  . TEE WITHOUT CARDIOVERSION N/A 11/21/2016   Procedure: TRANSESOPHAGEAL ECHOCARDIOGRAM (TEE);  Surgeon: Deboraha Sprang, MD;  Location: [facility]S;  Service: Cardiovascular;  Laterality: N/A;  . UPPER GI ENDOSCOPY     Social History   Socioeconomic History  . Marital status: Married    Spouse name: Fraser Din  . Number of children: 1  . Years of education: 70  . Highest education level: Associate degree: academic program  Occupational History  . 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 Smoking status: Never Smoker  . Smokeless tobacco: Never Used  Vaping Use  . Vaping Use: Never used  Substance and Sexual Activity  . Alcohol use: No    Alcohol/week: 0.0 standard drinks  . Drug use: No  . Sexual activity: Never  Other Topics Concern  . Not on file  Social History Narrative  . Not on file   Social Determinants of Health   Financial Resource Strain: Not on file  Food Insecurity: Not on file  Transportation Needs: Not on file  Physical Activity: Not on file  Stress: Not on file  Social Connections: Not on file  Intimate Partner Violence: Not on file   Social History   Social History Narrative  . Not on file     ROS: Negative.     PE: HEENT: Negative. Lungs: Clear. Cardio: RR.  11/30/16: CSY (indic: PHx polyps) -18 mm TA in ascending colon. 2 5-6 mm SSA in ascending colon. 4 mm hyperplastic polyp in transverse colon. Diverticulosis.   Assessment/Plan:  Proceed with planned endoscopy.  Forest Gleason [facility] 10/21/2020

## 2020-10-21 NOTE — Anesthesia Preprocedure Evaluation (Signed)
Anesthesia Evaluation  Patient identified by MRN, date of birth, ID band Patient awake    Reviewed: Allergy & Precautions, H&P , NPO status , Patient's Chart, lab work & pertinent test results  History of Anesthesia Complications (+) history of anesthetic complications  Airway Mallampati: III  TM Distance: <3 FB Neck ROM: limited    Dental  (+) Chipped, Poor Dentition, Missing   Pulmonary neg pulmonary ROS, neg shortness of breath,    Pulmonary exam normal        Cardiovascular Exercise Tolerance: Good hypertension, (-) angina+ CAD and +CHF  Normal cardiovascular exam+ dysrhythmias + pacemaker + Valvular Problems/Murmurs      Neuro/Psych  Neuromuscular disease negative psych ROS   GI/Hepatic Neg liver ROS, GERD  Medicated and Controlled,  Endo/Other  negative endocrine ROS  Renal/GU Renal disease  negative genitourinary   Musculoskeletal  (+) Arthritis ,   Abdominal   Peds  Hematology negative hematology ROS (+)   Anesthesia Other Findings Past Medical History: No date: Arthritis No date: Atrial fibrillation (HCC) No date: Atrial fibrillation (HCC) No date: Carpal tunnel syndrome on both sides No date: CHF (congestive heart failure) ([place]) 10/2018: CKD (chronic kidney disease)     Comment:  recent diagnosis No date: Complete heart block ([place])     Comment:  a. s/p St. Jude PPM 11/2014. No date: Complication of anesthesia     Comment:  stayed overnight dt under too long d/t taking               Parkinson's medication (2013?) No date: Coronary artery disease No date: GERD (gastroesophageal reflux disease)     Comment:  "not as bad as it used to be" No date: Heart murmur 07/2018: History of recent blood transfusion No date: Hyperlipidemia No date: Hypertension No date: IBS (irritable bowel syndrome) No date: Lower extremity edema No date: Parkinson's disease ([place]) No date: Presence of permanent cardiac  pacemaker     Comment:  St Jude No date: Shortness of breath dyspnea  Past Surgical History: 12/2010: APPENDECTOMY 08/06/2018: BACK SURGERY     Comment:  lower lumbarsacral region, has rods in place. (done at               [place]) 11/20/2012: CARDIAC CATHETERIZATION     Comment:  Known CAD with one vessel coronary disease of left               descending artery by cardiac cath.  11/21/2016: CARDIOVERSION; N/A     Comment:  Procedure: CARDIOVERSION;  Surgeon: Deboraha Sprang, MD;               Location: [place] [facility];  Service: Cardiovascular;                Laterality: N/A; 03/08/2019: CARDIOVERSION; N/A     Comment:  Procedure: CARDIOVERSION;  Surgeon: Fay Records, MD;                Location: [facility] ENDOSCOPY;  Service: Cardiovascular;                Laterality: N/A; 10/22/2018: CARPAL TUNNEL RELEASE; Right     Comment:  Procedure: CARPAL TUNNEL RELEASE;  Surgeon: Dereck Leep, MD;  Location: [facility];  Service: Orthopedics;               Laterality: Right; 05/22/2019: CARPAL TUNNEL RELEASE; Left  Comment:  Procedure: CARPAL TUNNEL RELEASE - LEFT;  Surgeon:               Dereck Leep, MD;  Location: [facility];  Service:               Orthopedics;  Laterality: Left; 2018: COLONOSCOPY 10/23/2019: HERNIA REPAIR     Comment:  umbilical hernia repair No date: INSERT / REPLACE / REMOVE PACEMAKER 11-21-14: PACEMAKER INSERTION     Comment:  STJ Assurity dual chamber pacemaker implanted by Dr               Rayann Heman for CHB 11/21/2014: PERMANENT PACEMAKER INSERTION; N/A     Comment:  Procedure: PERMANENT PACEMAKER INSERTION;  Surgeon:               Thompson Grayer, MD;  Location: [place] CATH LAB;  Service:               Cardiovascular;  Laterality: N/A; 11/21/2016: TEE WITHOUT CARDIOVERSION; N/A     Comment:  Procedure: TRANSESOPHAGEAL ECHOCARDIOGRAM (TEE);                Surgeon: Deboraha Sprang, MD;  Location: [facility];                Service: Cardiovascular;  Laterality: N/A; No  date: UPPER GI ENDOSCOPY  BMI    Body Mass Index: 33.50 kg/m      Reproductive/Obstetrics negative OB ROS                             Anesthesia Physical Anesthesia Plan  ASA: III  Anesthesia Plan: General   Post-op Pain Management:    Induction: Intravenous  PONV Risk Score and Plan: Propofol infusion and TIVA  Airway Management Planned: Natural Airway and Nasal Cannula  Additional Equipment:   Intra-op Plan:   Post-operative Plan:   Informed Consent: I have reviewed the patients History and Physical, chart, labs and discussed the procedure including the risks, benefits and alternatives for the proposed anesthesia with the patient or authorized representative who has indicated his/her understanding and acceptance.     Dental Advisory Given  Plan Discussed with: Anesthesiologist, CRNA and Surgeon  Anesthesia Plan Comments: (Patient consented for risks of anesthesia including but not limited to:  - adverse reactions to medications - risk of airway placement if required - damage to eyes, teeth, lips or other oral mucosa - nerve damage due to positioning  - sore throat or hoarseness - Damage to heart, brain, nerves, lungs, other parts of body or loss of life  Patient voiced understanding.)        Anesthesia Quick Evaluation

## 2020-10-22 ENCOUNTER — Encounter: Payer: Self-pay | Admitting: General Surgery

## 2020-10-22 LAB — SURGICAL PATHOLOGY

## 2020-11-03 ENCOUNTER — Ambulatory Visit (INDEPENDENT_AMBULATORY_CARE_PROVIDER_SITE_OTHER): Payer: Medicare Other

## 2020-11-03 DIAGNOSIS — I442 Atrioventricular block, complete: Secondary | ICD-10-CM

## 2020-11-03 LAB — CUP PACEART REMOTE DEVICE CHECK
Battery Remaining Longevity: 105 mo
Battery Remaining Percentage: 89 %
Battery Voltage: 2.95 V
Brady Statistic AP VP Percent: 99 %
Brady Statistic AP VS Percent: 1 %
Brady Statistic AS VP Percent: 1 %
Brady Statistic AS VS Percent: 1 %
Brady Statistic RA Percent Paced: 99 %
Brady Statistic RV Percent Paced: 99 %
Date Time Interrogation Session: 20220125023733
Implantable Lead Implant Date: 20160212
Implantable Lead Implant Date: 20160212
Implantable Lead Location: 753859
Implantable Lead Location: 753860
Implantable Lead Model: 1948
Implantable Pulse Generator Implant Date: 20160212
Lead Channel Impedance Value: 490 Ohm
Lead Channel Impedance Value: 580 Ohm
Lead Channel Pacing Threshold Amplitude: 0.625 V
Lead Channel Pacing Threshold Amplitude: 1.375 V
Lead Channel Pacing Threshold Pulse Width: 0.4 ms
Lead Channel Pacing Threshold Pulse Width: 0.4 ms
Lead Channel Sensing Intrinsic Amplitude: 1.5 mV
Lead Channel Sensing Intrinsic Amplitude: 12 mV
Lead Channel Setting Pacing Amplitude: 1.625
Lead Channel Setting Pacing Amplitude: 1.625
Lead Channel Setting Pacing Pulse Width: 0.4 ms
Lead Channel Setting Sensing Sensitivity: 4 mV
Pulse Gen Model: 2240
Pulse Gen Serial Number: 3049931

## 2020-11-14 NOTE — Progress Notes (Signed)
Remote pacemaker transmission.   

## 2020-11-15 ENCOUNTER — Other Ambulatory Visit: Payer: Self-pay | Admitting: Internal Medicine

## 2020-11-15 ENCOUNTER — Other Ambulatory Visit: Payer: Self-pay | Admitting: Cardiovascular Disease

## 2021-01-01 ENCOUNTER — Other Ambulatory Visit: Payer: Self-pay | Admitting: Internal Medicine

## 2021-01-01 NOTE — Telephone Encounter (Signed)
Pt's age 75, wt 112 kg, SCr 1.7, CrCl 61.31, last ov w/ SK 04/28/20.

## 2021-02-02 ENCOUNTER — Ambulatory Visit (INDEPENDENT_AMBULATORY_CARE_PROVIDER_SITE_OTHER): Payer: Medicare Other

## 2021-02-02 DIAGNOSIS — I442 Atrioventricular block, complete: Secondary | ICD-10-CM

## 2021-02-02 LAB — CUP PACEART REMOTE DEVICE CHECK
Battery Remaining Longevity: 106 mo
Battery Remaining Percentage: 89 %
Battery Voltage: 2.95 V
Brady Statistic AP VP Percent: 99 %
Brady Statistic AP VS Percent: 1 %
Brady Statistic AS VP Percent: 1 %
Brady Statistic AS VS Percent: 1 %
Brady Statistic RA Percent Paced: 99 %
Brady Statistic RV Percent Paced: 99 %
Date Time Interrogation Session: 20220426020015
Implantable Lead Implant Date: 20160212
Implantable Lead Implant Date: 20160212
Implantable Lead Location: 753859
Implantable Lead Location: 753860
Implantable Lead Model: 1948
Implantable Pulse Generator Implant Date: 20160212
Lead Channel Impedance Value: 510 Ohm
Lead Channel Impedance Value: 600 Ohm
Lead Channel Pacing Threshold Amplitude: 0.625 V
Lead Channel Pacing Threshold Amplitude: 1.25 V
Lead Channel Pacing Threshold Pulse Width: 0.4 ms
Lead Channel Pacing Threshold Pulse Width: 0.4 ms
Lead Channel Sensing Intrinsic Amplitude: 1.9 mV
Lead Channel Sensing Intrinsic Amplitude: 12 mV
Lead Channel Setting Pacing Amplitude: 1.5 V
Lead Channel Setting Pacing Amplitude: 1.625
Lead Channel Setting Pacing Pulse Width: 0.4 ms
Lead Channel Setting Sensing Sensitivity: 4 mV
Pulse Gen Model: 2240
Pulse Gen Serial Number: 3049931

## 2021-02-23 NOTE — Progress Notes (Signed)
Remote pacemaker transmission.   

## 2021-04-27 ENCOUNTER — Other Ambulatory Visit: Payer: Self-pay | Admitting: Cardiovascular Disease

## 2021-04-28 NOTE — Telephone Encounter (Signed)
Please schedule overdue 12 month F/U appointment. Thank you! 

## 2021-04-29 NOTE — Telephone Encounter (Signed)
*  STAT* If patient is at the pharmacy, call can be transferred to refill team.   1. Which medications need to be refilled? (please list name of each medication and dose if known) torsemide 20 mg  take 3 tabs po q d PATIENT TAKING 4 instead due to excess fluid   2. Which pharmacy/location (including street and city if local pharmacy) is medication to be sent to? [facility]   3. Do they need a 30 day or 90 day supply? 90  Patient is running out of meds

## 2021-04-30 NOTE — Telephone Encounter (Signed)
Please advise of OK to refill torsemide '20mg'$ -4 tablets daily. Patient states he has been taking 4 daily due to having an increased amount of fluid on him. (See message from front office staff below) Overdue for F/U appointment but scheduled for 9/22. Thank you!

## 2021-04-30 NOTE — Telephone Encounter (Signed)
Spoke to pt's wife, notified I will send in refill for Torsemide '20mg'$  take 3 tablets daily, since pt is out at this time.  Called to follow up re pt incr dose on his own. Wife states pt has been taking 4 tablets daily for the past 4 months.  States "that is the only way he has been able to keep his swelling down."  Wife confirms pt has NOT had any worsening symptoms. "Doing well."  Pt did have BMET at [facility] (in chart in care everywhere) on 03/15/21 - Potassium WNL at 4.6.  Pt does have follow up scheduled to see Dr. Rockey Situ 06/22/21.   Notified pt's wife that I will forward to Dr. Rockey Situ for further recc in the meantime.

## 2021-04-30 NOTE — Telephone Encounter (Signed)
Patient wife calling to check status . Patient is out of meds.

## 2021-05-02 ENCOUNTER — Other Ambulatory Visit: Payer: Self-pay | Admitting: Cardiovascular Disease

## 2021-05-02 MED ORDER — TORSEMIDE 20 MG PO TABS: 40.0000 mg | ORAL_TABLET | Freq: Two times a day (BID) | ORAL | 1 refills | Status: DC

## 2021-05-04 ENCOUNTER — Ambulatory Visit (INDEPENDENT_AMBULATORY_CARE_PROVIDER_SITE_OTHER): Payer: Medicare Other

## 2021-05-04 DIAGNOSIS — I442 Atrioventricular block, complete: Secondary | ICD-10-CM | POA: Diagnosis not present

## 2021-05-04 LAB — CUP PACEART REMOTE DEVICE CHECK
Battery Remaining Longevity: 38 mo
Battery Remaining Percentage: 31 %
Battery Voltage: 2.93 V
Brady Statistic AP VP Percent: 99 %
Brady Statistic AP VS Percent: 1 %
Brady Statistic AS VP Percent: 1 %
Brady Statistic AS VS Percent: 1 %
Brady Statistic RA Percent Paced: 99 %
Brady Statistic RV Percent Paced: 99 %
Date Time Interrogation Session: 20220726020015
Implantable Lead Implant Date: 20160212
Implantable Lead Implant Date: 20160212
Implantable Lead Location: 753859
Implantable Lead Location: 753860
Implantable Lead Model: 1948
Implantable Pulse Generator Implant Date: 20160212
Lead Channel Impedance Value: 530 Ohm
Lead Channel Impedance Value: 600 Ohm
Lead Channel Pacing Threshold Amplitude: 0.625 V
Lead Channel Pacing Threshold Amplitude: 1.375 V
Lead Channel Pacing Threshold Pulse Width: 0.4 ms
Lead Channel Pacing Threshold Pulse Width: 0.4 ms
Lead Channel Sensing Intrinsic Amplitude: 1.9 mV
Lead Channel Sensing Intrinsic Amplitude: 12 mV
Lead Channel Setting Pacing Amplitude: 1.625
Lead Channel Setting Pacing Amplitude: 1.625
Lead Channel Setting Pacing Pulse Width: 0.4 ms
Lead Channel Setting Sensing Sensitivity: 4 mV
Pulse Gen Model: 2240
Pulse Gen Serial Number: 3049931

## 2021-05-10 ENCOUNTER — Other Ambulatory Visit: Payer: Self-pay

## 2021-05-10 MED ORDER — TORSEMIDE 20 MG PO TABS: 40.0000 mg | ORAL_TABLET | Freq: Two times a day (BID) | ORAL | 1 refills | Status: DC

## 2021-05-10 NOTE — Telephone Encounter (Signed)
Dr. Rockey Situ okay to send in refill for torsemide Will discuss medication further and future refill at appt on 9/13

## 2021-05-12 ENCOUNTER — Other Ambulatory Visit: Payer: Self-pay | Admitting: Internal Medicine

## 2021-05-31 NOTE — Progress Notes (Signed)
Remote pacemaker transmission.   

## 2021-06-21 NOTE — Progress Notes (Signed)
Cardiology Office Note  Date:  06/22/2021   ID:  [name], [name] 1945-10-16, MRN [id number]  PCP:  Leonel Ramsay, MD   Chief Complaint  Patient presents with   12 month follow up     Patient c/o LE edema. Medications reviewed by the patient's wife Fraser Din) verbally.     HPI:  [name] is a 75 year old gentleman with diagnosis of  Parkinson's who currently works delivering car parts,  Hypertension,  cardiac catheterization showing mild CAD , February 2014  EF 55% hyperlipidemia,  s/p  pacemaker placement.complete heart block.  Found to have atrial fibrillation 11/22/2014 on pacemaker download, started on anticoagulation    hospitalization for paroxysmal atrial fibrillation/acute on chronic diastolic CHF He presents today for weight gain, shortness of breath, leg edema, chronic diastolic CHF Paroxysmal atrial fibrillation  Last seen by myself in clinic July 2021  Reviewed notes, lisinopril 40 mg  held early 2020 for low pressure:  Recently seen by nephrology, they have indicated they would like to start low-dose lisinopril He has follow-up appointment with them in 10 days time  Still active on the farm, has to come in and take a break Broken sleep at night Dealing with the Parkinson's  Through dietary changes, weight trending down  High water intake On torsemide 40 daily with potassium 40 daily  Lab work reviewed Creatinine 1.7 in June 2022 HBA1C 6.1 Total chol 178, LDL 118  EKG personally reviewed by myself on todays visit Paced rhythm rate 60 bpm  Prior arrhythmia history Persistent atrial fib since 08/2018 noted on pacer download Cardioversion for atrial fib 03/08/2019 Recently seen by Dr. Caryl Comes 11/2018 , maintaining normal sinus rhythm on flecainide  Recovered from hernia surgery January 2021  Echo 03/15/2019 ejection fraction of 50-55%.   Other past medical history  presented to the hospital 07/28/2015 with abdominal bloating, shortness of breath Was  treated with diureticswith improvement of his symptoms    leads a busy lifestyle, continues to work most days of the week. Fatigue, tremor Followed by neurology, Dr. Melrose Nakayama for Parkinson's Previous issues with constipation   He presented to [facility] [place] with bradycardia February 2016. This seemed to improve without intervention and he is asymptomatic. He had recurrent symptoms and we referred him to [facility] for further evaluation. He was seen in the emergency room and found to be in heart block, pacemaker placed urgently    Previous Stress echo leading to cardiac catheterization at [facility].   Stress echo was a limited study, ejection fraction estimated at 45% with EF of 50% at peak stress.   Cardiac catheterization 11/20/2012 with 25% proximal LAD disease, followed by 40% lesion. The cardiac catheterization note makes mention of atrial flutter though arrhythmia is not mentioned in any of the clinic notes.   Previous Echocardiogram shows ejection fraction 50-55%, mild to moderate aortic valve insufficiency, otherwise normal study.   Abdominal CT 12/22/2010 showed acute appendicitis, colonic diverticulosis      PMH:   has a past medical history of Arthritis, Atrial fibrillation ([place]), Atrial fibrillation ([place]), Carpal tunnel syndrome on both sides, CHF (congestive heart failure) ([place]), CKD (chronic kidney disease) (10/2018), Complete heart block ([place]), Complication of anesthesia, Coronary artery disease, GERD (gastroesophageal reflux disease), Heart murmur, History of recent blood transfusion (07/2018), Hyperlipidemia, Hypertension, IBS (irritable bowel syndrome), Lower extremity edema, Parkinson's disease ([place]), Presence of permanent cardiac pacemaker, and Shortness of breath dyspnea.  PSH:    Past Surgical History:  Procedure Laterality Date   APPENDECTOMY  12/2010   BACK SURGERY  08/06/2018   lower lumbarsacral region, has rods in place. (done at [facility])   [place]   11/20/2012   Known CAD with one vessel coronary disease of left descending artery by cardiac cath.    CARDIOVERSION N/A 11/21/2016   Procedure: CARDIOVERSION;  Surgeon: Deboraha Sprang, MD;  Location: [facility];  Service: Cardiovascular;  Laterality: N/A;   CARDIOVERSION N/A 03/08/2019   Procedure: CARDIOVERSION;  Surgeon: Fay Records, MD;  [facility] ENDOSCOPY;  Service: Cardiovascular;  Laterality: N/A;   CARPAL TUNNEL RELEASE Right 10/22/2018   Procedure: CARPAL TUNNEL RELEASE;  Surgeon: Dereck Leep, MD;  Location: [facility];  Service: Orthopedics;  Laterality: Right;   CARPAL TUNNEL RELEASE Left 05/22/2019   Procedure: CARPAL TUNNEL RELEASE - LEFT;  Surgeon: Dereck Leep, MD;  Location: [facility];  Service: Orthopedics;  Laterality: Left;   COLONOSCOPY  2018   COLONOSCOPY N/A 10/21/2020   Procedure: COLONOSCOPY;  Surgeon: Robert Bellow, MD;  Location: [facility] ENDOSCOPY;  Service: Endoscopy;  Laterality: N/A;   HERNIA REPAIR  [id number]   umbilical hernia repair   INSERT / REPLACE / REMOVE PACEMAKER     PACEMAKER INSERTION  11-21-14   STJ Assurity dual chamber pacemaker implanted by Dr Rayann Heman for CHB   PERMANENT PACEMAKER INSERTION N/A 11/21/2014   Procedure: PERMANENT PACEMAKER INSERTION;  Surgeon: Thompson Grayer, MD;  Location: [facility] CATH LAB;  Service: Cardiovascular;  Laterality: N/A;   TEE WITHOUT CARDIOVERSION N/A 11/21/2016   Procedure: TRANSESOPHAGEAL ECHOCARDIOGRAM (TEE);  Surgeon: Deboraha Sprang, MD;  Location: [facility]S;  Service: Cardiovascular;  Laterality: N/A;   UPPER GI ENDOSCOPY      Current Outpatient Medications  Medication Sig Dispense Refill   acetaminophen (TYLENOL) 500 MG tablet Take 500 mg by mouth every 6 (six) hours as needed (back pain.).      allopurinol (ZYLOPRIM) 100 MG tablet Take 100 mg by mouth daily.     allopurinol (ZYLOPRIM) 300 MG tablet Take 300 mg by mouth daily.  3   bisacodyl (DULCOLAX) 5 MG EC tablet Take 5 mg by mouth 2 (two) times daily.      carboxymethylcellulose (REFRESH PLUS) 0.5 % SOLN Place 1 drop into both eyes at bedtime.     desloratadine (CLARINEX) 5 MG tablet Take 5 mg by mouth at bedtime.      ELIQUIS 5 MG TABS tablet TAKE 1 TABLET BY MOUTH TWICE A DAY 180 tablet 1   flecainide (TAMBOCOR) 100 MG tablet TAKE 1 TABLET BY MOUTH TWICE A DAY 180 tablet 2   fluticasone (FLONASE) 50 MCG/ACT nasal spray Place 2 sprays into the nose daily as needed for allergies.      lansoprazole (PREVACID) 15 MG capsule Take 15 mg by mouth daily as needed (indigestion).      magnesium oxide (MAG-OX) 400 MG tablet Take 400 mg by mouth at bedtime.     meloxicam (MOBIC) 15 MG tablet Take 15 mg by mouth 2 (two) times a week. Mondays & Thursdays     Multiple Vitamins-Minerals (OCUVITE ADULT 50+ PO) Take 1 tablet by mouth daily.     polyethylene glycol (MIRALAX / GLYCOLAX) packet Take 17 g by mouth daily as needed (constipation.).      Red Yeast Rice 600 MG CAPS Take 600 mg by mouth daily.     selegiline (ELDEPRYL) 5 MG capsule Take 5 mg by mouth 2 (two) times daily with a meal.  SENEXON-S 8.6-50 MG tablet Take 2 tablets by mouth 2 (two) times daily.     carvedilol (COREG) 12.5 MG tablet Take 0.5 tablets (6.25 mg total) by mouth 2 (two) times daily. Please keep upcoming appointment in Sept. 2022 for future refills. thank you 90 tablet 0   metolazone (ZAROXOLYN) 2.5 MG tablet TAKE 1 TABLET BY MOUTH DAILY AS NEEDED (ONCE DAILY AS NEEDED FOR WEIGHT GAIN OR SWELLING.). 90 tablet 3   potassium chloride (KLOR-CON) 10 MEQ tablet Take 4 tablets (40 mEq total) by mouth in the morning. 240 tablet 5   pramipexole (MIRAPEX) 1 MG tablet Take 1 mg by mouth 3 (three) times daily. (Patient not taking: Reported on 06/22/2021)     torsemide (DEMADEX) 20 MG tablet Take 4 tablets (80 mg total) by mouth every morning. 240 tablet 5   No current facility-administered medications for this visit.     Allergies:   Linaclotide, Codeine, Oxycodone, Terazosin, Gabapentin,  Hydrocodone-acetaminophen, Lubiprostone, Norco [hydrocodone-acetaminophen], Omeprazole, and Ropinirole   Social History:  The patient  reports that he has never smoked. He has never used smokeless tobacco. He reports that he does not drink alcohol and does not use drugs.   Family History:   family history includes Colon cancer in his father; Heart disease in his father and mother; Stroke in his sister.    Review of Systems: Review of Systems  Constitutional: Negative.   Respiratory: Negative.    Cardiovascular: Negative.   Gastrointestinal: Negative.   Musculoskeletal:  Positive for joint pain.       Swelling of his joints in the hands wrist  Neurological: Negative.   Psychiatric/Behavioral: Negative.    All other systems reviewed and are negative.   PHYSICAL EXAM: VS:  BP (!) 146/72 (BP Location: Left Arm, Patient Position: Sitting, Cuff Size: Normal)   Pulse 60   Ht '6\' 1"'$  (1.854 m)   Wt 254 lb (115.2 kg)   SpO2 96%   BMI 33.51 kg/m  , BMI Body mass index is 33.51 kg/m. Constitutional:  oriented to person, place, and time. No distress.  HENT:  Head: Grossly normal Eyes:  no discharge. No scleral icterus.  Neck: No JVD, no carotid bruits  Cardiovascular: Regular rate and rhythm, no murmurs appreciated Pulmonary/Chest: Clear to auscultation bilaterally, no wheezes or rails Abdominal: Soft.  no distension.  no tenderness.  Musculoskeletal: Normal range of motion Neurological:  normal muscle tone. Coordination normal. No atrophy Skin: Skin warm and dry Psychiatric: normal affect, pleasant   Recent Labs: No results found for requested labs within last 8760 hours.    Lipid Panel Lab Results  Component Value Date   CHOL 188 03/23/2016   HDL 46 03/23/2016   LDLCALC 114 (H) 03/23/2016   TRIG 138 03/23/2016      Wt Readings from Last 3 Encounters:  06/22/21 254 lb (115.2 kg)  06/22/21 254 lb (115.2 kg)  10/21/20 247 lb (112 kg)      ASSESSMENT AND  PLAN:  Essential hypertension -  Blood pressure is well controlled on today's visit. No changes made to the medications.  Paroxysmal atrial fibrillation (HCC) - Successful cardioversion May 2020  on flecainide 100 twice daily and Coreg 6.25 twice daily Pacer downloads reviewed, maintaining normal sinus rhythm  Coronary artery disease involving native coronary artery of native heart without angina pectoris Currently with no symptoms of angina. No further workup at this time. Continue current medication regimen.  Complete heart block ([place]) Pacemaker in place, followed by Dr.  Caryl Comes  Mixed hyperlipidemia Recommended weight loss  Constipation Likely related to his Parkinson's disease and dehydration, Takes senna, dulcolax  Chronic diastolic heart failure (HCC) Weight is stable on torsemide 60 twice a day  followed by nephrology, CR 1.7 They may start low-dose lisinopril  Parkinson's disease ([place]) Stable Followed by  Dr. Melrose Nakayama  Morbid obesity [facility]) Has changed his diet, lower calories, weight down  Shortness of breath Weight down, remains active on the farm Recommended daily walking program   Total encounter time more than 25 minutes  Greater than 50% was spent in counseling and coordination of care with the patient   Orders Placed This Encounter  Procedures   EKG 12-Lead      Signed, Esmond Plants, M.D., Ph.D. 06/22/2021  [place], [place]

## 2021-06-22 ENCOUNTER — Ambulatory Visit (INDEPENDENT_AMBULATORY_CARE_PROVIDER_SITE_OTHER): Payer: Medicare Other | Admitting: Cardiovascular Disease

## 2021-06-22 ENCOUNTER — Ambulatory Visit (INDEPENDENT_AMBULATORY_CARE_PROVIDER_SITE_OTHER): Payer: Medicare Other | Admitting: Internal Medicine

## 2021-06-22 ENCOUNTER — Encounter: Payer: Self-pay | Admitting: Cardiovascular Disease

## 2021-06-22 ENCOUNTER — Other Ambulatory Visit: Payer: Self-pay

## 2021-06-22 ENCOUNTER — Encounter: Payer: Self-pay | Admitting: Internal Medicine

## 2021-06-22 VITALS — BP 146/72 | HR 60 | Ht 73.0 in | Wt 254.0 lb

## 2021-06-22 VITALS — BP 146/72 | HR 60 | Ht 72.0 in | Wt 254.0 lb

## 2021-06-22 DIAGNOSIS — I4819 Other persistent atrial fibrillation: Secondary | ICD-10-CM | POA: Diagnosis not present

## 2021-06-22 DIAGNOSIS — I442 Atrioventricular block, complete: Secondary | ICD-10-CM

## 2021-06-22 DIAGNOSIS — G20 Parkinson's disease: Secondary | ICD-10-CM

## 2021-06-22 DIAGNOSIS — I77810 Thoracic aortic ectasia: Secondary | ICD-10-CM

## 2021-06-22 DIAGNOSIS — I10 Essential (primary) hypertension: Secondary | ICD-10-CM

## 2021-06-22 DIAGNOSIS — Z950 Presence of cardiac pacemaker: Secondary | ICD-10-CM | POA: Diagnosis not present

## 2021-06-22 DIAGNOSIS — G20A1 Parkinson's disease without dyskinesia, without mention of fluctuations: Secondary | ICD-10-CM

## 2021-06-22 DIAGNOSIS — I5032 Chronic diastolic (congestive) heart failure: Secondary | ICD-10-CM

## 2021-06-22 MED ORDER — CARVEDILOL 12.5 MG PO TABS: 6.2500 mg | ORAL_TABLET | Freq: Two times a day (BID) | ORAL | 0 refills | Status: DC

## 2021-06-22 MED ORDER — METOLAZONE 2.5 MG PO TABS: ORAL_TABLET | ORAL | 3 refills | Status: DC

## 2021-06-22 MED ORDER — POTASSIUM CHLORIDE ER 10 MEQ PO TBCR: 40.0000 meq | EXTENDED_RELEASE_TABLET | Freq: Every morning | ORAL | 5 refills | Status: DC

## 2021-06-22 MED ORDER — TORSEMIDE 20 MG PO TABS: 80.0000 mg | ORAL_TABLET | Freq: Every morning | ORAL | 5 refills | Status: DC

## 2021-06-22 NOTE — Progress Notes (Signed)
Patient Care Team: Leonel Ramsay, MD as PCP - General (Infectious Diseases) Minna Merritts, MD as PCP - Cardiology (Cardiology) Deboraha Sprang, MD as PCP - Electrophysiology (Cardiology) Alisa Graff, FNP as Nurse Practitioner (Cardiology) Minna Merritts, MD as Consulting Physician (Cardiology) Anabel Bene, MD as Referring Physician (Neurology)   HPI  Matthew Howe is a 75 y.o. male Seen following pacemaker implantation 2/16 by Dr. Greggory Brandy . He had presented to [facility] with chest pain and shortness of breath. He was found to be in complete heart block.  Also atrial standstill. Persistence of atrial fibrillation assoc with SOB Cardioversion was accomplished.   Rx with flecainide; anticoagulation with Apixoban   \ The patient denies chest pain.  There have been no palpitations, lightheadedness or syncope.  Complains of chronic stable dyspnea and edema, they modify his diuretics based on degree of swelling  Disturbed sleeping pattern--short bursts and day time naps   Has been able to keep the 25 lbs off.        Catheterization 2/14 had demonstrated mild nonobstructive disease. DATE TEST EF   10/16 echo   55-60 %   3/18 Echo   55-60 %   6/20 Echo  55-60  Asc Ao 3.9 LVH mild LAE (-/53/38)      Date Cr K Hgb  2/18 1.36   14.7  1/19  0.38 4.5 13.3  3/19     12.4  12/19 2.29 4.4 10.7  5/20 2.0 4.3 13.2  8/20 2.3    12/20 1.53 4.4 13.8  6/21 1.6 4.4 14.6  6/22 1.7 4.6 14.2          Past Medical History:  Diagnosis Date   Arthritis    Atrial fibrillation [facility])    Atrial fibrillation (HCC)    Carpal tunnel syndrome on both sides    CHF (congestive heart failure) ([place])    CKD (chronic kidney disease) 10/2018   recent diagnosis   Complete heart block ([place])    a. s/p St. Jude PPM 11/2014.   Complication of anesthesia    stayed overnight dt under too long d/t taking Parkinson's medication (2013?)   Coronary artery disease    GERD  (gastroesophageal reflux disease)    "not as bad as it used to be"   Heart murmur    History of recent blood transfusion 07/2018   Hyperlipidemia    Hypertension    IBS (irritable bowel syndrome)    Lower extremity edema    Parkinson's disease ([place])    Presence of permanent cardiac pacemaker    St Jude   Shortness of breath dyspnea     Past Surgical History:  Procedure Laterality Date   APPENDECTOMY  12/2010   BACK SURGERY  08/06/2018   lower lumbarsacral region, has rods in place. (done at [facility])   [place]  11/20/2012   Known CAD with one vessel coronary disease of left descending artery by cardiac cath.    CARDIOVERSION N/A 11/21/2016   Procedure: CARDIOVERSION;  Surgeon: Deboraha Sprang, MD;  Location: [facility];  Service: Cardiovascular;  Laterality: N/A;   CARDIOVERSION N/A 03/08/2019   Procedure: CARDIOVERSION;  Surgeon: Fay Records, MD;  Location: [facility] ENDOSCOPY;  Service: Cardiovascular;  Laterality: N/A;   CARPAL TUNNEL RELEASE Right 10/22/2018   Procedure: CARPAL TUNNEL RELEASE;  Surgeon: Dereck Leep, MD;  Location: [facility];  Service: Orthopedics;  Laterality: Right;   CARPAL TUNNEL RELEASE Left  05/22/2019   Procedure: CARPAL TUNNEL RELEASE - LEFT;  Surgeon: Dereck Leep, MD;  Location: [facility];  Service: Orthopedics;  Laterality: Left;   COLONOSCOPY  2018   COLONOSCOPY N/A 10/21/2020   Procedure: COLONOSCOPY;  Surgeon: Robert Bellow, MD;  Location: [facility] ENDOSCOPY;  Service: Endoscopy;  Laterality: N/A;   HERNIA REPAIR  [id number]   umbilical hernia repair   INSERT / REPLACE / REMOVE PACEMAKER     PACEMAKER INSERTION  11-21-14   STJ Assurity dual chamber pacemaker implanted by Dr Rayann Heman for CHB   PERMANENT PACEMAKER INSERTION N/A 11/21/2014   Procedure: PERMANENT PACEMAKER INSERTION;  Surgeon: Thompson Grayer, MD;  [facility] CATH LAB;  Service: Cardiovascular;  Laterality: N/A;   TEE WITHOUT CARDIOVERSION N/A 11/21/2016   Procedure:  TRANSESOPHAGEAL ECHOCARDIOGRAM (TEE);  Surgeon: Deboraha Sprang, MD;  Location: [facility];  Service: Cardiovascular;  Laterality: N/A;   UPPER GI ENDOSCOPY      Current Outpatient Medications  Medication Sig Dispense Refill   acetaminophen (TYLENOL) 500 MG tablet Take 500 mg by mouth every 6 (six) hours as needed (back pain.).      allopurinol (ZYLOPRIM) 100 MG tablet Take 100 mg by mouth daily.     allopurinol (ZYLOPRIM) 300 MG tablet Take 300 mg by mouth daily.  3   bisacodyl (DULCOLAX) 5 MG EC tablet Take 5 mg by mouth 2 (two) times daily.     carboxymethylcellulose (REFRESH PLUS) 0.5 % SOLN Place 1 drop into both eyes at bedtime.     carvedilol (COREG) 12.5 MG tablet Take 0.5 tablets (6.25 mg total) by mouth 2 (two) times daily. Please keep upcoming appointment in Sept. 2022 for future refills. thank you 90 tablet 0   desloratadine (CLARINEX) 5 MG tablet Take 5 mg by mouth at bedtime.      ELIQUIS 5 MG TABS tablet TAKE 1 TABLET BY MOUTH TWICE A DAY 180 tablet 1   flecainide (TAMBOCOR) 100 MG tablet TAKE 1 TABLET BY MOUTH TWICE A DAY 180 tablet 2   fluticasone (FLONASE) 50 MCG/ACT nasal spray Place 2 sprays into the nose daily as needed for allergies.      lansoprazole (PREVACID) 15 MG capsule Take 15 mg by mouth daily as needed (indigestion).      magnesium oxide (MAG-OX) 400 MG tablet Take 400 mg by mouth at bedtime.     meloxicam (MOBIC) 15 MG tablet Take 15 mg by mouth 2 (two) times a week. Mondays & Thursdays     metolazone (ZAROXOLYN) 2.5 MG tablet TAKE 1 TABLET BY MOUTH DAILY AS NEEDED (ONCE DAILY AS NEEDED FOR WEIGHT GAIN OR SWELLING.). 90 tablet 1   Multiple Vitamins-Minerals (OCUVITE ADULT 50+ PO) Take 1 tablet by mouth daily.     polyethylene glycol (MIRALAX / GLYCOLAX) packet Take 17 g by mouth daily as needed (constipation.).      potassium chloride (KLOR-CON) 10 MEQ tablet TAKE 3 TABLETS (30 MEQ TOTAL) BY MOUTH DAILY. 270 tablet 2   pramipexole (MIRAPEX) 1 MG tablet Take 1 mg by  mouth 3 (three) times daily.     Red Yeast Rice 600 MG CAPS Take 600 mg by mouth daily.     selegiline (ELDEPRYL) 5 MG capsule Take 5 mg by mouth 2 (two) times daily with a meal.      SENEXON-S 8.6-50 MG tablet Take 2 tablets by mouth 2 (two) times daily.     torsemide (DEMADEX) 20 MG tablet Take 2 tablets (40 mg total) by  mouth 2 (two) times daily. 120 tablet 1   No current facility-administered medications for this visit.    Allergies  Allergen Reactions   Linzess [Linaclotide] Shortness Of Breath   Codeine Diarrhea and Nausea And Vomiting   Oxycodone Nausea And Vomiting   Terazosin     Dizziness / altered mental state    Amitiza [Lubiprostone] Other (See Comments)    Abdominal cramps   Gabapentin Other (See Comments)    dizziness   Norco [Hydrocodone-Acetaminophen] Diarrhea and Nausea And Vomiting   Omeprazole Diarrhea and Nausea And Vomiting   Requip [Ropinirole] Nausea Only    Review of Systems negative except from HPI and PMH  Physical Exam BP (!) 146/72 (BP Location: Left Arm, Patient Position: Sitting, Cuff Size: Normal)   Pulse 60   Ht 6' (1.829 m)   Wt 254 lb (115.2 kg)   SpO2 96%   BMI 34.45 kg/m  Well developed and well nourished in no acute distress HENT normal Neck supple with JVP 6-7 Clear Device pocket well healed; without hematoma or erythema.  There is no tethering  Regular rate and rhythm, no  gallop No  murmur Abd-soft with active BS No Clubbing cyanosis 1-2+ edema Skin-warm and dry A & Oriented  Grossly normal sensory and motor function  ECG AV pacing QRSd 270 msec    Assessment and  Plan  Complete heart block  Sinus arrest  Atrial fibrillation-persistent  Pacemaker-St. Jude    Parkinson's disease  Hypertension  Hypertensive heart disease   Renal insuff gd 3  HFpEF  Aortic root Dilitation  Obesity   No intercurrent atrial fibrillation or flutter; continue flecainide 100 mg;  On Anticoagulant for thromboembolic risk  reduction.  No bleeding issues.  Continue Apixoban   at 5 bid  BP elevated but may just be here  continue carvedilol 6.25 bid  Mildly volume overloaded will continue torsemide 60-80 qam  Sleep disturbances  I suspect he has sleep apnea, and have recommended sleep study.  Not sure he would be willing to use CPAP so will hold off for now  Broad QRS -- will plan echo at next visit to assess for pacemaker induced LV dysfunction . [name]

## 2021-06-22 NOTE — Patient Instructions (Signed)

## 2021-06-22 NOTE — Patient Instructions (Addendum)
Medication Instructions:  No changes  If you need a refill on your cardiac medications before your next appointment, please call your pharmacy.   Lab work: No new labs needed  Testing/Procedures: No new testing needed  Follow-Up: At [facility], you and your health needs are our priority.  As part of our continuing mission to provide you with exceptional heart care, we have created designated Provider Care Teams.  These Care Teams include your primary Cardiologist (physician) and Advanced Practice Providers (APPs -  Physician Assistants and Nurse Practitioners) who all work together to provide you with the care you need, when you need it.  You will need a follow up appointment in 12 months  Providers on your designated Care Team:   Christopher Berge, NP Ryan Dunn, PA-C Jacquelyn Visser, PA-C Cadence Furth, PA-C  COVID-19 Vaccine Information can be found at: [email or web address] For questions related to vaccine distribution or appointments, please email [email or web address] or call [phone].    

## 2021-07-14 ENCOUNTER — Ambulatory Visit: Payer: Medicare Other | Admitting: Dermatology

## 2021-08-02 ENCOUNTER — Other Ambulatory Visit: Payer: Self-pay

## 2021-08-02 MED ORDER — TORSEMIDE 20 MG PO TABS: 80.0000 mg | ORAL_TABLET | Freq: Every morning | ORAL | 3 refills | Status: DC

## 2021-08-02 MED ORDER — POTASSIUM CHLORIDE ER 10 MEQ PO TBCR: 40.0000 meq | EXTENDED_RELEASE_TABLET | Freq: Every morning | ORAL | 3 refills | Status: DC

## 2021-08-03 ENCOUNTER — Ambulatory Visit (INDEPENDENT_AMBULATORY_CARE_PROVIDER_SITE_OTHER): Payer: Medicare Other

## 2021-08-03 DIAGNOSIS — I442 Atrioventricular block, complete: Secondary | ICD-10-CM | POA: Diagnosis not present

## 2021-08-03 LAB — CUP PACEART REMOTE DEVICE CHECK
Battery Remaining Longevity: 34 mo
Battery Remaining Percentage: 29 %
Battery Voltage: 2.93 V
Brady Statistic AP VP Percent: 99 %
Brady Statistic AP VS Percent: 1 %
Brady Statistic AS VP Percent: 1 %
Brady Statistic AS VS Percent: 0 %
Brady Statistic RA Percent Paced: 99 %
Brady Statistic RV Percent Paced: 99 %
Date Time Interrogation Session: 20221025020019
Implantable Lead Implant Date: 20160212
Implantable Lead Implant Date: 20160212
Implantable Lead Location: 753859
Implantable Lead Location: 753860
Implantable Lead Model: 1948
Implantable Pulse Generator Implant Date: 20160212
Lead Channel Impedance Value: 510 Ohm
Lead Channel Impedance Value: 630 Ohm
Lead Channel Pacing Threshold Amplitude: 0.625 V
Lead Channel Pacing Threshold Amplitude: 1.25 V
Lead Channel Pacing Threshold Pulse Width: 0.4 ms
Lead Channel Pacing Threshold Pulse Width: 0.4 ms
Lead Channel Sensing Intrinsic Amplitude: 1.9 mV
Lead Channel Sensing Intrinsic Amplitude: 10.8 mV
Lead Channel Setting Pacing Amplitude: 1.5 V
Lead Channel Setting Pacing Amplitude: 1.625
Lead Channel Setting Pacing Pulse Width: 0.4 ms
Lead Channel Setting Sensing Sensitivity: 4 mV
Pulse Gen Model: 2240
Pulse Gen Serial Number: 3049931

## 2021-08-11 NOTE — Progress Notes (Signed)
Remote pacemaker transmission.   

## 2021-08-16 ENCOUNTER — Telehealth: Payer: Self-pay

## 2021-08-16 MED ORDER — APIXABAN 5 MG PO TABS: 5.0000 mg | ORAL_TABLET | Freq: Two times a day (BID) | ORAL | 1 refills | Status: DC

## 2021-08-16 NOTE — Telephone Encounter (Signed)
Eliquis 5  mg refill request received. Patient is 75 years old, weight- 115.2 kg, Crea-1.7 on 03/15/21, Diagnosis-afib, and last seen by Dr. Rockey Situ on 06/22/21. Dose is appropriate based on dosing criteria. Will send in refill to requested pharmacy.

## 2021-08-26 ENCOUNTER — Other Ambulatory Visit: Payer: Self-pay | Admitting: *Deleted

## 2021-08-26 MED ORDER — CARVEDILOL 6.25 MG PO TABS: 6.2500 mg | ORAL_TABLET | Freq: Two times a day (BID) | ORAL | 2 refills | Status: DC

## 2021-09-22 ENCOUNTER — Encounter: Payer: Self-pay | Admitting: Cardiovascular Disease

## 2021-09-22 ENCOUNTER — Other Ambulatory Visit: Payer: Self-pay

## 2021-09-22 MED ORDER — FLECAINIDE ACETATE 100 MG PO TABS: 100.0000 mg | ORAL_TABLET | Freq: Two times a day (BID) | ORAL | 2 refills | Status: DC

## 2021-11-02 ENCOUNTER — Ambulatory Visit (INDEPENDENT_AMBULATORY_CARE_PROVIDER_SITE_OTHER): Payer: Medicare Other

## 2021-11-02 DIAGNOSIS — I442 Atrioventricular block, complete: Secondary | ICD-10-CM

## 2021-11-02 LAB — CUP PACEART REMOTE DEVICE CHECK
Battery Remaining Longevity: 30 mo
Battery Remaining Percentage: 26 %
Battery Voltage: 2.92 V
Brady Statistic AP VP Percent: 99 %
Brady Statistic AP VS Percent: 1 %
Brady Statistic AS VP Percent: 1 %
Brady Statistic AS VS Percent: 1 %
Brady Statistic RA Percent Paced: 99 %
Brady Statistic RV Percent Paced: 99 %
Date Time Interrogation Session: 20230124032148
Implantable Lead Implant Date: 20160212
Implantable Lead Implant Date: 20160212
Implantable Lead Location: 753859
Implantable Lead Location: 753860
Implantable Lead Model: 1948
Implantable Pulse Generator Implant Date: 20160212
Lead Channel Impedance Value: 450 Ohm
Lead Channel Impedance Value: 580 Ohm
Lead Channel Pacing Threshold Amplitude: 0.625 V
Lead Channel Pacing Threshold Amplitude: 1.625 V
Lead Channel Pacing Threshold Pulse Width: 0.4 ms
Lead Channel Pacing Threshold Pulse Width: 0.4 ms
Lead Channel Sensing Intrinsic Amplitude: 12 mV
Lead Channel Sensing Intrinsic Amplitude: 2.7 mV
Lead Channel Setting Pacing Amplitude: 1.625
Lead Channel Setting Pacing Amplitude: 1.875
Lead Channel Setting Pacing Pulse Width: 0.4 ms
Lead Channel Setting Sensing Sensitivity: 4 mV
Pulse Gen Model: 2240
Pulse Gen Serial Number: 3049931

## 2021-11-12 NOTE — Progress Notes (Signed)
Remote pacemaker transmission.   

## 2022-01-17 ENCOUNTER — Encounter: Payer: Self-pay | Admitting: Cardiovascular Disease

## 2022-01-18 ENCOUNTER — Other Ambulatory Visit: Payer: Self-pay | Admitting: *Deleted

## 2022-01-18 MED ORDER — APIXABAN 5 MG PO TABS: 5.0000 mg | ORAL_TABLET | Freq: Two times a day (BID) | ORAL | 1 refills | Status: DC

## 2022-01-18 NOTE — Telephone Encounter (Signed)
Please review

## 2022-01-18 NOTE — Telephone Encounter (Signed)
Prescription refill request for Eliquis received. ?Indication:  Atrial Fib ?Last office visit: 06/22/21  Johnny Bridge MD ?Scr: 1.6 on 09/15/21 ?Age: 76 ?Weight: 115.2kg ? ?Based on above findings Eliquis '5mg'$  twice daily is the appropriate dose.  Refill approved. ? ?

## 2022-02-01 ENCOUNTER — Ambulatory Visit (INDEPENDENT_AMBULATORY_CARE_PROVIDER_SITE_OTHER): Payer: Medicare Other

## 2022-02-01 DIAGNOSIS — I442 Atrioventricular block, complete: Secondary | ICD-10-CM | POA: Diagnosis not present

## 2022-02-01 LAB — CUP PACEART REMOTE DEVICE CHECK
Battery Remaining Longevity: 26 mo
Battery Remaining Percentage: 23 %
Battery Voltage: 2.92 V
Brady Statistic AP VP Percent: 99 %
Brady Statistic AP VS Percent: 1 %
Brady Statistic AS VP Percent: 1 %
Brady Statistic AS VS Percent: 1 %
Brady Statistic RA Percent Paced: 99 %
Brady Statistic RV Percent Paced: 99 %
Date Time Interrogation Session: 20230425030331
Implantable Lead Implant Date: 20160212
Implantable Lead Implant Date: 20160212
Implantable Lead Location: 753859
Implantable Lead Location: 753860
Implantable Lead Model: 1948
Implantable Pulse Generator Implant Date: 20160212
Lead Channel Impedance Value: 490 Ohm
Lead Channel Impedance Value: 580 Ohm
Lead Channel Pacing Threshold Amplitude: 0.75 V
Lead Channel Pacing Threshold Amplitude: 1.5 V
Lead Channel Pacing Threshold Pulse Width: 0.4 ms
Lead Channel Pacing Threshold Pulse Width: 0.4 ms
Lead Channel Sensing Intrinsic Amplitude: 12 mV
Lead Channel Sensing Intrinsic Amplitude: 2.7 mV
Lead Channel Setting Pacing Amplitude: 1.75 V
Lead Channel Setting Pacing Amplitude: 1.75 V
Lead Channel Setting Pacing Pulse Width: 0.4 ms
Lead Channel Setting Sensing Sensitivity: 4 mV
Pulse Gen Model: 2240
Pulse Gen Serial Number: 3049931

## 2022-02-17 NOTE — Progress Notes (Signed)
Remote pacemaker transmission.   

## 2022-05-01 ENCOUNTER — Other Ambulatory Visit: Payer: Self-pay | Admitting: Cardiovascular Disease

## 2022-05-02 NOTE — Telephone Encounter (Signed)
Please schedule 12 month F/U appt for 90 day refills. Thank you! 

## 2022-05-03 ENCOUNTER — Ambulatory Visit (INDEPENDENT_AMBULATORY_CARE_PROVIDER_SITE_OTHER): Payer: Medicare Other

## 2022-05-03 DIAGNOSIS — I442 Atrioventricular block, complete: Secondary | ICD-10-CM

## 2022-05-03 LAB — CUP PACEART REMOTE DEVICE CHECK
Battery Remaining Longevity: 23 mo
Battery Remaining Percentage: 20 %
Battery Voltage: 2.9 V
Brady Statistic AP VP Percent: 99 %
Brady Statistic AP VS Percent: 1 %
Brady Statistic AS VP Percent: 1 %
Brady Statistic AS VS Percent: 1 %
Brady Statistic RA Percent Paced: 99 %
Brady Statistic RV Percent Paced: 99 %
Date Time Interrogation Session: 20230725020019
Implantable Lead Implant Date: 20160212
Implantable Lead Implant Date: 20160212
Implantable Lead Location: 753859
Implantable Lead Location: 753860
Implantable Lead Model: 1948
Implantable Pulse Generator Implant Date: 20160212
Lead Channel Impedance Value: 460 Ohm
Lead Channel Impedance Value: 560 Ohm
Lead Channel Pacing Threshold Amplitude: 0.625 V
Lead Channel Pacing Threshold Amplitude: 1.5 V
Lead Channel Pacing Threshold Pulse Width: 0.4 ms
Lead Channel Pacing Threshold Pulse Width: 0.4 ms
Lead Channel Sensing Intrinsic Amplitude: 12 mV
Lead Channel Sensing Intrinsic Amplitude: 2.7 mV
Lead Channel Setting Pacing Amplitude: 1.625
Lead Channel Setting Pacing Amplitude: 1.75 V
Lead Channel Setting Pacing Pulse Width: 0.4 ms
Lead Channel Setting Sensing Sensitivity: 4 mV
Pulse Gen Model: 2240
Pulse Gen Serial Number: 3049931

## 2022-05-03 NOTE — Telephone Encounter (Signed)
Scheduled

## 2022-05-31 NOTE — Progress Notes (Signed)
Remote pacemaker transmission.   

## 2022-06-05 ENCOUNTER — Other Ambulatory Visit: Payer: Self-pay | Admitting: Cardiovascular Disease

## 2022-06-07 ENCOUNTER — Other Ambulatory Visit: Payer: Self-pay | Admitting: Cardiovascular Disease

## 2022-07-20 ENCOUNTER — Other Ambulatory Visit: Payer: Self-pay | Admitting: Cardiovascular Disease

## 2022-07-31 NOTE — Progress Notes (Unsigned)
Cardiology Office Note  Date:  08/01/2022   ID:  Matthew Howe, Matthew Howe Dec 05, 1945, MRN [id number]  PCP:  Leonel Ramsay, MD   Chief Complaint  Patient presents with   12 month follow up     Patient c/o bialteral LE edema. Medications reviewed by the patient verbally.     HPI:  [name] is a 76 year old gentleman with diagnosis of  Parkinson's who currently works delivering car parts,  Hypertension,  cardiac catheterization showing mild CAD , February 2014  EF 55% hyperlipidemia,  s/p  pacemaker placement.complete heart block.  Found to have atrial fibrillation 11/22/2014 on pacemaker download, started on anticoagulation    hospitalization for paroxysmal atrial fibrillation/acute on chronic diastolic CHF He presents today for weight gain, shortness of breath, leg edema, chronic diastolic CHF,Paroxysmal atrial fibrillation  Last seen by myself in clinic September 2022 Takes torsemide 80 mg daily with potassium  Wife with recent knee surgery, he has been doing more around the house For by Dr. Melrose Nakayama, neurology Big complaint is tingling and discomfort in ankles/feet Taking meloxicam provided by Dr. Melrose Nakayama  Active [place] farm as much as possible, limited by Parkinson's No other exercise program  Reports leg swelling is minimal, stable  Labs reviewed A1C 6.0 Total chol 181, LDL 121 HGB 14 CR 1.8, BUN   Pacer download reviewed, battery 1.9 years remaining  EKG personally reviewed by myself on todays visit AV paced rhythm rate 65 bpm  Prior arrhythmia history Persistent atrial fib since 08/2018 noted on pacer download Cardioversion for atrial fib 03/08/2019 Recently seen by Dr. Caryl Comes 11/2018 , maintaining normal sinus rhythm on flecainide  Recovered from hernia surgery January 2021  Echo 03/15/2019 ejection fraction of 50-55%.   Other past medical history  presented to the hospital 07/28/2015 with abdominal bloating, shortness of breath Was treated with diureticswith  improvement of his symptoms    leads a busy lifestyle, continues to work most days of the week. Fatigue, tremor Followed by neurology, Dr. Melrose Nakayama for Parkinson's Previous issues with constipation   He presented to [facility] with bradycardia February 2016. This seemed to improve without intervention and he is asymptomatic. He had recurrent symptoms and we referred him to [facility] for further evaluation. He was seen in the emergency room and found to be in heart block, pacemaker placed urgently    Previous Stress echo leading to cardiac catheterization at [facility].   Stress echo was a limited study, ejection fraction estimated at 45% with EF of 50% at peak stress.   Cardiac catheterization 11/20/2012 with 25% proximal LAD disease, followed by 40% lesion. The cardiac catheterization note makes mention of atrial flutter though arrhythmia is not mentioned in any of the clinic notes.   Previous Echocardiogram shows ejection fraction 50-55%, mild to moderate aortic valve insufficiency, otherwise normal study.   Abdominal CT 12/22/2010 showed acute appendicitis, colonic diverticulosis     PMH:   has a past medical history of Arthritis, Atrial fibrillation ([place]), Atrial fibrillation ([place]), Carpal tunnel syndrome on both sides, CHF (congestive heart failure) ([place]), CKD (chronic kidney disease) (10/2018), Complete heart block ([place]), Complication of anesthesia, Coronary artery disease, GERD (gastroesophageal reflux disease), Heart murmur, History of recent blood transfusion (07/2018), Hyperlipidemia, Hypertension, IBS (irritable bowel syndrome), Lower extremity edema, Parkinson's disease, Presence of permanent cardiac pacemaker, and Shortness of breath dyspnea.  PSH:    Past Surgical History:  Procedure Laterality Date   APPENDECTOMY  12/2010   BACK SURGERY  08/06/2018   lower lumbarsacral region,  has rods in place. (done at [facility])   [place]  11/20/2012   Known CAD with one vessel  coronary disease of left descending artery by cardiac cath.    CARDIOVERSION N/A 11/21/2016   Procedure: CARDIOVERSION;  Surgeon: Deboraha Sprang, MD;  Location: [place] [facility];  Service: Cardiovascular;  Laterality: N/A;   CARDIOVERSION N/A 03/08/2019   Procedure: CARDIOVERSION;  Surgeon: Fay Records, MD;  Location: [facility] ENDOSCOPY;  Service: Cardiovascular;  Laterality: N/A;   CARPAL TUNNEL RELEASE Right 10/22/2018   Procedure: CARPAL TUNNEL RELEASE;  Surgeon: Dereck Leep, MD;  Location: [facility] ORS;  Service: Orthopedics;  Laterality: Right;   CARPAL TUNNEL RELEASE Left 05/22/2019   Procedure: CARPAL TUNNEL RELEASE - LEFT;  Surgeon: Dereck Leep, MD;  Location: [facility] ORS;  Service: Orthopedics;  Laterality: Left;   COLONOSCOPY  2018   COLONOSCOPY N/A 10/21/2020   Procedure: COLONOSCOPY;  Surgeon: Robert Bellow, MD;  Location: [facility] ENDOSCOPY;  Service: Endoscopy;  Laterality: N/A;   HERNIA REPAIR  34/19/6222   umbilical hernia repair   INSERT / REPLACE / REMOVE PACEMAKER     PACEMAKER INSERTION  11-21-14   STJ Assurity dual chamber pacemaker implanted by Dr Rayann Heman for CHB   PERMANENT PACEMAKER INSERTION N/A 11/21/2014   Procedure: PERMANENT PACEMAKER INSERTION;  Surgeon: Thompson Grayer, MD;  Location: [facility] CATH LAB;  Service: Cardiovascular;  Laterality: N/A;   TEE WITHOUT CARDIOVERSION N/A 11/21/2016   Procedure: TRANSESOPHAGEAL ECHOCARDIOGRAM (TEE);  Surgeon: Deboraha Sprang, MD;  Location: [facility] ORS;  Service: Cardiovascular;  Laterality: N/A;   UPPER GI ENDOSCOPY      Current Outpatient Medications  Medication Sig Dispense Refill   acetaminophen (TYLENOL) 500 MG tablet Take 500 mg by mouth every 6 (six) hours as needed (back pain.).      allopurinol (ZYLOPRIM) 100 MG tablet Take 100 mg by mouth daily.     allopurinol (ZYLOPRIM) 300 MG tablet Take 300 mg by mouth daily.  3   apixaban (ELIQUIS) 5 MG TABS tablet Take 1 tablet (5 mg total) by mouth 2 (two) times daily. 180 tablet 1   bisacodyl  (DULCOLAX) 5 MG EC tablet Take 5 mg by mouth 2 (two) times daily.     carboxymethylcellulose (REFRESH PLUS) 0.5 % SOLN Place 1 drop into both eyes at bedtime.     carvedilol (COREG) 6.25 MG tablet TAKE 1 TABLET BY MOUTH TWICE A DAY 180 tablet 0   desloratadine (CLARINEX) 5 MG tablet Take 5 mg by mouth at bedtime.      flecainide (TAMBOCOR) 100 MG tablet TAKE 1 TABLET BY MOUTH TWICE A DAY 180 tablet 0   fluticasone (FLONASE) 50 MCG/ACT nasal spray Place 2 sprays into the nose daily as needed for allergies.      lansoprazole (PREVACID) 15 MG capsule Take 15 mg by mouth daily as needed (indigestion).      lisinopril (ZESTRIL) 2.5 MG tablet Take 2.5 mg by mouth daily.     magnesium oxide (MAG-OX) 400 MG tablet Take 400 mg by mouth at bedtime.     meloxicam (MOBIC) 15 MG tablet Take 15 mg by mouth 2 (two) times a week. Mondays & Thursdays     metolazone (ZAROXOLYN) 2.5 MG tablet TAKE 1 TABLET BY MOUTH DAILY AS NEEDED (ONCE DAILY AS NEEDED FOR WEIGHT GAIN OR SWELLING.). 90 tablet 3   Multiple Vitamins-Minerals (OCUVITE ADULT 50+ PO) Take 1 tablet by mouth daily.     polyethylene glycol (MIRALAX / GLYCOLAX)  packet Take 17 g by mouth daily as needed (constipation.).      potassium chloride (KLOR-CON) 10 MEQ tablet TAKE 4 TABLETS (40 MEQ TOTAL) BY MOUTH IN THE MORNING. 360 tablet 0   pramipexole (MIRAPEX) 1 MG tablet Take 1 mg by mouth 3 (three) times daily.     Red Yeast Rice 600 MG CAPS Take 600 mg by mouth daily.     selegiline (ELDEPRYL) 5 MG capsule Take 5 mg by mouth 2 (two) times daily with a meal.      SENEXON-S 8.6-50 MG tablet Take 2 tablets by mouth 2 (two) times daily.     torsemide (DEMADEX) 20 MG tablet TAKE 4 TABLETS (80 MG TOTAL) BY MOUTH EVERY MORNING. 360 tablet 0   No current facility-administered medications for this visit.     Allergies:   Linaclotide, Codeine, Oxycodone, Terazosin, Gabapentin, Hydrocodone-acetaminophen, Lubiprostone, Norco [hydrocodone-acetaminophen], Omeprazole,  and Ropinirole   Social History:  The patient  reports that he has never smoked. He has never used smokeless tobacco. He reports that he does not drink alcohol and does not use drugs.   Family History:   family history includes Colon cancer in his father; Heart disease in his father and mother; Stroke in his sister.    Review of Systems: Review of Systems  Constitutional: Negative.   Respiratory: Negative.    Cardiovascular: Negative.   Gastrointestinal: Negative.   Musculoskeletal: Negative.        Ankle tingling and pain  Neurological: Negative.   Psychiatric/Behavioral: Negative.    All other systems reviewed and are negative.   PHYSICAL EXAM: VS:  BP (!) 120/54 (BP Location: Left Arm, Patient Position: Sitting, Cuff Size: Large)   Pulse 65   Ht 6' (1.829 m)   Wt 252 lb 2 oz (114.4 kg)   SpO2 96%   BMI 34.19 kg/m  , BMI Body mass index is 34.19 kg/m. Constitutional:  oriented to person, place, and time. No distress.  HENT:  Head: Grossly normal Eyes:  no discharge. No scleral icterus.  Neck: No JVD, no carotid bruits  Cardiovascular: Regular rate and rhythm, no murmurs appreciated Pulmonary/Chest: Clear to auscultation bilaterally, no wheezes or rails Abdominal: Soft.  no distension.  no tenderness.  Musculoskeletal: Normal range of motion Neurological:  normal muscle tone. Coordination normal. No atrophy Skin: Skin warm and dry Psychiatric: normal affect, pleasant   Recent Labs: No results found for requested labs within last 365 days.    Lipid Panel Lab Results  Component Value Date   CHOL 188 03/23/2016   HDL 46 03/23/2016   LDLCALC 114 (H) 03/23/2016   TRIG 138 03/23/2016      Wt Readings from Last 3 Encounters:  08/01/22 252 lb 2 oz (114.4 kg)  06/22/21 254 lb (115.2 kg)  06/22/21 254 lb (115.2 kg)      ASSESSMENT AND PLAN:  Essential hypertension -  Blood pressure is well controlled on today's visit. No changes made to the  medications.  Paroxysmal atrial fibrillation (HCC) - Successful cardioversion May 2020  on flecainide 100 twice daily and Coreg 6.25 twice daily Paced rhythm Pacer downloads reviewed  Coronary artery disease involving native coronary artery of native heart without angina pectoris Currently with no symptoms of angina. No further workup at this time. Continue current medication regimen.  Complete heart block ([place]) Pacemaker in place, followed by Dr. Caryl Comes  Mixed hyperlipidemia Low carbohydrate diet recommended  Constipation Likely related to his Parkinson's disease and dehydration, Stable on  senna and Dulcolax  Chronic diastolic heart failure (HCC) Weight is stable on torsemide 80 twice a day  followed by nephrology, creatinine elevated but stable, GFR less than 40  Parkinson's disease ([place]) Stable Followed by  Dr. Melrose Nakayama Being complaint is tingling in his ankles, recommend he talk with neurology or primary care.  Would like to minimize use of meloxicam as he is on anticoagulation.  This discussed with him  Morbid obesity ([place]) We have encouraged continued exercise, careful diet management in an effort to lose weight.  Shortness of breath Weight remains high, recommended low carbohydrate diet, walking program as much as tolerated   Total encounter time more than 30 minutes  Greater than 50% was spent in counseling and coordination of care with the patient   Orders Placed This Encounter  Procedures   EKG 12-Lead      Signed, Esmond Plants, M.D., Ph.D. 08/01/2022  [place], [place]

## 2022-08-01 ENCOUNTER — Ambulatory Visit: Payer: Medicare Other | Attending: Cardiovascular Disease | Admitting: Cardiovascular Disease

## 2022-08-01 ENCOUNTER — Encounter: Payer: Self-pay | Admitting: Cardiovascular Disease

## 2022-08-01 ENCOUNTER — Telehealth: Payer: Self-pay | Admitting: Internal Medicine

## 2022-08-01 VITALS — BP 120/54 | HR 65 | Ht 72.0 in | Wt 252.1 lb

## 2022-08-01 DIAGNOSIS — I25118 Atherosclerotic heart disease of native coronary artery with other forms of angina pectoris: Secondary | ICD-10-CM | POA: Diagnosis not present

## 2022-08-01 DIAGNOSIS — I77810 Thoracic aortic ectasia: Secondary | ICD-10-CM | POA: Insufficient documentation

## 2022-08-01 DIAGNOSIS — G20A1 Parkinson's disease without dyskinesia, without mention of fluctuations: Secondary | ICD-10-CM | POA: Insufficient documentation

## 2022-08-01 DIAGNOSIS — Z950 Presence of cardiac pacemaker: Secondary | ICD-10-CM | POA: Diagnosis not present

## 2022-08-01 DIAGNOSIS — I5032 Chronic diastolic (congestive) heart failure: Secondary | ICD-10-CM | POA: Diagnosis present

## 2022-08-01 DIAGNOSIS — I442 Atrioventricular block, complete: Secondary | ICD-10-CM | POA: Diagnosis not present

## 2022-08-01 DIAGNOSIS — I4819 Other persistent atrial fibrillation: Secondary | ICD-10-CM | POA: Insufficient documentation

## 2022-08-01 DIAGNOSIS — I10 Essential (primary) hypertension: Secondary | ICD-10-CM | POA: Insufficient documentation

## 2022-08-01 NOTE — Patient Instructions (Addendum)
Medication Instructions:  ?No changes ? ?If you need a refill on your cardiac medications before your next appointment, please call your pharmacy.  ? ?Lab work: ?No new labs needed ? ?Testing/Procedures: ?No new testing needed ? ?Follow-Up: ?At [facility], you and your health needs are our priority.  As part of our continuing mission to provide you with exceptional heart care, we have created designated Provider Care Teams.  These Care Teams include your primary Cardiologist (physician) and Advanced Practice Providers (APPs -  Physician Assistants and Nurse Practitioners) who all work together to provide you with the care you need, when you need it. ? ?You will need a follow up appointment in 6 months, APP ok ? ?Providers on your designated Care Team:   ?Christopher Berge, NP ?Ryan Dunn, PA-C ?Cadence Furth, PA-C ? ?COVID-19 Vaccine Information can be found at: [email or web address] For questions related to vaccine distribution or appointments, please email [email or web address] or call [phone].  ? ?

## 2022-08-01 NOTE — Telephone Encounter (Signed)
Pt c/o medication issue:  1. Name of Medication: pramipexole (MIRAPEX) 1 MG tablet  2. How are you currently taking this medication (dosage and times per day)? 1 tablet 4x a day   3. Are you having a reaction (difficulty breathing--STAT)? No   4. What is your medication issue? Wife is calling requesting patient's medication list be updated to reflect the patient is taking this 4x a day.

## 2022-08-02 ENCOUNTER — Ambulatory Visit (INDEPENDENT_AMBULATORY_CARE_PROVIDER_SITE_OTHER): Payer: Medicare Other

## 2022-08-02 DIAGNOSIS — I442 Atrioventricular block, complete: Secondary | ICD-10-CM | POA: Diagnosis not present

## 2022-08-02 NOTE — Telephone Encounter (Signed)
Pt wife would like [place] to update chart to reflect that neuro changed pramipexole (MIRAPEX) 1 MG tablet  1 tablet 4x a day, verified [place] neuro note: pramipexole (MIRAPEX) 1 MG Take 1 tablet (1 mg total) by mouth 4 (four) times daily tablet.  Updated chart. See note in chart

## 2022-08-02 NOTE — Telephone Encounter (Signed)
Medication updated in patient's chart, no further action required removing from triage pool

## 2022-08-04 LAB — CUP PACEART REMOTE DEVICE CHECK
Battery Remaining Longevity: 19 mo
Battery Remaining Percentage: 17 %
Battery Voltage: 2.89 V
Brady Statistic AP VP Percent: 99 %
Brady Statistic AP VS Percent: 1 %
Brady Statistic AS VP Percent: 1 %
Brady Statistic AS VS Percent: 1 %
Brady Statistic RA Percent Paced: 99 %
Brady Statistic RV Percent Paced: 99 %
Date Time Interrogation Session: 20231024042604
Implantable Lead Connection Status: 753985
Implantable Lead Connection Status: 753985
Implantable Lead Implant Date: 20160212
Implantable Lead Implant Date: 20160212
Implantable Lead Location: 753859
Implantable Lead Location: 753860
Implantable Lead Model: 1948
Implantable Pulse Generator Implant Date: 20160212
Lead Channel Impedance Value: 460 Ohm
Lead Channel Impedance Value: 560 Ohm
Lead Channel Pacing Threshold Amplitude: 0.625 V
Lead Channel Pacing Threshold Amplitude: 1.5 V
Lead Channel Pacing Threshold Pulse Width: 0.4 ms
Lead Channel Pacing Threshold Pulse Width: 0.4 ms
Lead Channel Sensing Intrinsic Amplitude: 12 mV
Lead Channel Sensing Intrinsic Amplitude: 2.7 mV
Lead Channel Setting Pacing Amplitude: 1.625
Lead Channel Setting Pacing Amplitude: 1.75 V
Lead Channel Setting Pacing Pulse Width: 0.4 ms
Lead Channel Setting Sensing Sensitivity: 4 mV
Pulse Gen Model: 2240
Pulse Gen Serial Number: 3049931

## 2022-08-08 ENCOUNTER — Encounter: Payer: Self-pay | Admitting: Cardiovascular Disease

## 2022-08-11 ENCOUNTER — Encounter: Payer: Self-pay | Admitting: Cardiovascular Disease

## 2022-08-12 ENCOUNTER — Other Ambulatory Visit: Payer: Self-pay

## 2022-08-12 MED ORDER — APIXABAN 5 MG PO TABS: 5.0000 mg | ORAL_TABLET | Freq: Two times a day (BID) | ORAL | 1 refills | Status: DC

## 2022-08-22 NOTE — Progress Notes (Signed)
Remote pacemaker transmission.   

## 2022-08-30 ENCOUNTER — Ambulatory Visit: Payer: Medicare Other | Attending: Internal Medicine | Admitting: Internal Medicine

## 2022-08-30 DIAGNOSIS — I77810 Thoracic aortic ectasia: Secondary | ICD-10-CM | POA: Diagnosis present

## 2022-08-30 DIAGNOSIS — I5032 Chronic diastolic (congestive) heart failure: Secondary | ICD-10-CM | POA: Diagnosis present

## 2022-08-30 DIAGNOSIS — I442 Atrioventricular block, complete: Secondary | ICD-10-CM | POA: Insufficient documentation

## 2022-08-30 DIAGNOSIS — I10 Essential (primary) hypertension: Secondary | ICD-10-CM | POA: Diagnosis present

## 2022-08-30 DIAGNOSIS — I4819 Other persistent atrial fibrillation: Secondary | ICD-10-CM | POA: Insufficient documentation

## 2022-08-30 DIAGNOSIS — Z950 Presence of cardiac pacemaker: Secondary | ICD-10-CM | POA: Insufficient documentation

## 2022-08-30 LAB — CUP PACEART INCLINIC DEVICE CHECK
Battery Remaining Longevity: 19 mo
Battery Voltage: 2.89 V
Brady Statistic RA Percent Paced: 99.44 %
Brady Statistic RV Percent Paced: 99.56 %
Date Time Interrogation Session: 20231121113838
Implantable Lead Connection Status: 753985
Implantable Lead Connection Status: 753985
Implantable Lead Implant Date: 20160212
Implantable Lead Implant Date: 20160212
Implantable Lead Location: 753859
Implantable Lead Location: 753860
Implantable Lead Model: 1948
Implantable Pulse Generator Implant Date: 20160212
Lead Channel Impedance Value: 512.5 Ohm
Lead Channel Impedance Value: 575 Ohm
Lead Channel Pacing Threshold Amplitude: 0.75 V
Lead Channel Pacing Threshold Amplitude: 0.75 V
Lead Channel Pacing Threshold Amplitude: 1.5 V
Lead Channel Pacing Threshold Amplitude: 1.5 V
Lead Channel Pacing Threshold Pulse Width: 0.4 ms
Lead Channel Pacing Threshold Pulse Width: 0.4 ms
Lead Channel Pacing Threshold Pulse Width: 0.4 ms
Lead Channel Pacing Threshold Pulse Width: 0.4 ms
Lead Channel Sensing Intrinsic Amplitude: 12 mV
Lead Channel Sensing Intrinsic Amplitude: 2.7 mV
Lead Channel Setting Pacing Amplitude: 1.625
Lead Channel Setting Pacing Amplitude: 1.75 V
Lead Channel Setting Pacing Pulse Width: 0.4 ms
Lead Channel Setting Sensing Sensitivity: 4 mV
Pulse Gen Model: 2240
Pulse Gen Serial Number: 3049931

## 2022-08-30 NOTE — Progress Notes (Signed)
Pacemaker check in clinic. Normal device function. Thresholds, sensing, impedances consistent with previous measurements. Device programmed to maximize longevity. No mode switch or high ventricular rates noted. Device programmed at appropriate safety margins. Histogram distribution appropriate for patient activity level. Device programmed to optimize intrinsic conduction. Estimated longevity 1.6 years. Patient enrolled in remote follow-up. Patient education completed.

## 2022-08-30 NOTE — Patient Instructions (Signed)
Recall entered for 6 month follow up with Dr. Caryl Comes.

## 2022-09-03 ENCOUNTER — Other Ambulatory Visit: Payer: Self-pay | Admitting: Cardiovascular Disease

## 2022-10-01 ENCOUNTER — Other Ambulatory Visit: Payer: Self-pay | Admitting: Cardiovascular Disease

## 2022-10-11 ENCOUNTER — Other Ambulatory Visit: Payer: Self-pay | Admitting: Cardiovascular Disease

## 2022-11-01 ENCOUNTER — Ambulatory Visit: Payer: Medicare Other | Attending: Internal Medicine

## 2022-11-01 DIAGNOSIS — I442 Atrioventricular block, complete: Secondary | ICD-10-CM

## 2022-11-01 LAB — CUP PACEART REMOTE DEVICE CHECK
Battery Remaining Longevity: 17 mo
Battery Remaining Percentage: 15 %
Battery Voltage: 2.87 V
Brady Statistic AP VP Percent: 99 %
Brady Statistic AP VS Percent: 1 %
Brady Statistic AS VP Percent: 1 %
Brady Statistic AS VS Percent: 1 %
Brady Statistic RA Percent Paced: 99 %
Brady Statistic RV Percent Paced: 99 %
Date Time Interrogation Session: 20240123020013
Implantable Lead Connection Status: 753985
Implantable Lead Connection Status: 753985
Implantable Lead Implant Date: 20160212
Implantable Lead Implant Date: 20160212
Implantable Lead Location: 753859
Implantable Lead Location: 753860
Implantable Lead Model: 1948
Implantable Pulse Generator Implant Date: 20160212
Lead Channel Impedance Value: 450 Ohm
Lead Channel Impedance Value: 560 Ohm
Lead Channel Pacing Threshold Amplitude: 0.625 V
Lead Channel Pacing Threshold Amplitude: 1.5 V
Lead Channel Pacing Threshold Pulse Width: 0.4 ms
Lead Channel Pacing Threshold Pulse Width: 0.4 ms
Lead Channel Sensing Intrinsic Amplitude: 12 mV
Lead Channel Sensing Intrinsic Amplitude: 3.2 mV
Lead Channel Setting Pacing Amplitude: 1.625
Lead Channel Setting Pacing Amplitude: 1.75 V
Lead Channel Setting Pacing Pulse Width: 0.4 ms
Lead Channel Setting Sensing Sensitivity: 4 mV
Pulse Gen Model: 2240
Pulse Gen Serial Number: 3049931

## 2022-11-04 ENCOUNTER — Emergency Department: Payer: Medicare Other

## 2022-11-04 ENCOUNTER — Inpatient Hospital Stay
Admission: EM | Admit: 2022-11-04 | Discharge: 2022-11-10 | DRG: 871 | Disposition: A | Discharge status: Home Health | Payer: Medicare Other | Attending: Obstetrics and Gynecology | Admitting: Obstetrics and Gynecology

## 2022-11-04 DIAGNOSIS — Z981 Arthrodesis status: Secondary | ICD-10-CM

## 2022-11-04 DIAGNOSIS — Z7901 Long term (current) use of anticoagulants: Secondary | ICD-10-CM

## 2022-11-04 DIAGNOSIS — Z1152 Encounter for screening for COVID-19: Secondary | ICD-10-CM

## 2022-11-04 DIAGNOSIS — Z66 Do not resuscitate: Secondary | ICD-10-CM | POA: Diagnosis present

## 2022-11-04 DIAGNOSIS — I5033 Acute on chronic diastolic (congestive) heart failure: Secondary | ICD-10-CM | POA: Diagnosis present

## 2022-11-04 DIAGNOSIS — I130 Hypertensive heart and chronic kidney disease with heart failure and stage 1 through stage 4 chronic kidney disease, or unspecified chronic kidney disease: Secondary | ICD-10-CM | POA: Diagnosis present

## 2022-11-04 DIAGNOSIS — R7881 Bacteremia: Secondary | ICD-10-CM

## 2022-11-04 DIAGNOSIS — Z79899 Other long term (current) drug therapy: Secondary | ICD-10-CM

## 2022-11-04 DIAGNOSIS — Z823 Family history of stroke: Secondary | ICD-10-CM

## 2022-11-04 DIAGNOSIS — Z950 Presence of cardiac pacemaker: Secondary | ICD-10-CM

## 2022-11-04 DIAGNOSIS — B953 Streptococcus pneumoniae as the cause of diseases classified elsewhere: Secondary | ICD-10-CM

## 2022-11-04 DIAGNOSIS — I480 Paroxysmal atrial fibrillation: Secondary | ICD-10-CM | POA: Diagnosis present

## 2022-11-04 DIAGNOSIS — L03115 Cellulitis of right lower limb: Secondary | ICD-10-CM | POA: Diagnosis present

## 2022-11-04 DIAGNOSIS — A419 Sepsis, unspecified organism: Secondary | ICD-10-CM | POA: Diagnosis present

## 2022-11-04 DIAGNOSIS — Z8739 Personal history of other diseases of the musculoskeletal system and connective tissue: Secondary | ICD-10-CM

## 2022-11-04 DIAGNOSIS — E669 Obesity, unspecified: Secondary | ICD-10-CM | POA: Diagnosis present

## 2022-11-04 DIAGNOSIS — J9601 Acute respiratory failure with hypoxia: Secondary | ICD-10-CM | POA: Diagnosis present

## 2022-11-04 DIAGNOSIS — I442 Atrioventricular block, complete: Secondary | ICD-10-CM | POA: Diagnosis present

## 2022-11-04 DIAGNOSIS — K219 Gastro-esophageal reflux disease without esophagitis: Secondary | ICD-10-CM | POA: Diagnosis present

## 2022-11-04 DIAGNOSIS — Z6833 Body mass index (BMI) 33.0-33.9, adult: Secondary | ICD-10-CM

## 2022-11-04 DIAGNOSIS — R4182 Altered mental status, unspecified: Principal | ICD-10-CM

## 2022-11-04 DIAGNOSIS — G9341 Metabolic encephalopathy: Secondary | ICD-10-CM | POA: Diagnosis present

## 2022-11-04 DIAGNOSIS — G629 Polyneuropathy, unspecified: Secondary | ICD-10-CM

## 2022-11-04 DIAGNOSIS — I2510 Atherosclerotic heart disease of native coronary artery without angina pectoris: Secondary | ICD-10-CM | POA: Diagnosis present

## 2022-11-04 DIAGNOSIS — I4819 Other persistent atrial fibrillation: Secondary | ICD-10-CM | POA: Diagnosis present

## 2022-11-04 DIAGNOSIS — Z885 Allergy status to narcotic agent status: Secondary | ICD-10-CM

## 2022-11-04 DIAGNOSIS — I429 Cardiomyopathy, unspecified: Secondary | ICD-10-CM

## 2022-11-04 DIAGNOSIS — Z888 Allergy status to other drugs, medicaments and biological substances status: Secondary | ICD-10-CM

## 2022-11-04 DIAGNOSIS — E785 Hyperlipidemia, unspecified: Secondary | ICD-10-CM | POA: Diagnosis present

## 2022-11-04 DIAGNOSIS — R6520 Severe sepsis without septic shock: Secondary | ICD-10-CM | POA: Diagnosis present

## 2022-11-04 DIAGNOSIS — A403 Sepsis due to Streptococcus pneumoniae: Principal | ICD-10-CM | POA: Diagnosis present

## 2022-11-04 DIAGNOSIS — G20A1 Parkinson's disease without dyskinesia, without mention of fluctuations: Secondary | ICD-10-CM | POA: Diagnosis present

## 2022-11-04 DIAGNOSIS — M109 Gout, unspecified: Secondary | ICD-10-CM | POA: Diagnosis present

## 2022-11-04 DIAGNOSIS — N1832 Chronic kidney disease, stage 3b: Secondary | ICD-10-CM | POA: Diagnosis present

## 2022-11-04 DIAGNOSIS — Z8249 Family history of ischemic heart disease and other diseases of the circulatory system: Secondary | ICD-10-CM

## 2022-11-04 DIAGNOSIS — Z800 Family history of malignant neoplasm of digestive organs: Secondary | ICD-10-CM

## 2022-11-04 DIAGNOSIS — N183 Chronic kidney disease, stage 3 unspecified: Secondary | ICD-10-CM | POA: Diagnosis present

## 2022-11-04 DIAGNOSIS — I10 Essential (primary) hypertension: Secondary | ICD-10-CM | POA: Diagnosis present

## 2022-11-04 LAB — CBC WITH DIFFERENTIAL/PLATELET
Abs Immature Granulocytes: 0.08 10*3/uL — ABNORMAL HIGH (ref 0.00–0.07)
Basophils Absolute: 0 10*3/uL (ref 0.0–0.1)
Basophils Relative: 0 %
Eosinophils Absolute: 0 10*3/uL (ref 0.0–0.5)
Eosinophils Relative: 0 %
HCT: 43.5 % (ref 39.0–52.0)
Hemoglobin: 13.9 g/dL (ref 13.0–17.0)
Immature Granulocytes: 1 %
Lymphocytes Relative: 2 %
Lymphs Abs: 0.3 10*3/uL — ABNORMAL LOW (ref 0.7–4.0)
MCH: 30.3 pg (ref 26.0–34.0)
MCHC: 32 g/dL (ref 30.0–36.0)
MCV: 95 fL (ref 80.0–100.0)
Monocytes Absolute: 0.9 10*3/uL (ref 0.1–1.0)
Monocytes Relative: 6 %
Neutro Abs: 13.7 10*3/uL — ABNORMAL HIGH (ref 1.7–7.7)
Neutrophils Relative %: 91 %
Platelets: 193 10*3/uL (ref 150–400)
RBC: 4.58 MIL/uL (ref 4.22–5.81)
RDW: 14.7 % (ref 11.5–15.5)
WBC: 15.1 10*3/uL — ABNORMAL HIGH (ref 4.0–10.5)
nRBC: 0 % (ref 0.0–0.2)

## 2022-11-04 LAB — RESP PANEL BY RT-PCR (RSV, FLU A&B, COVID)  RVPGX2
Influenza A by PCR: NEGATIVE
Influenza B by PCR: NEGATIVE
Resp Syncytial Virus by PCR: NEGATIVE
SARS Coronavirus 2 by RT PCR: NEGATIVE

## 2022-11-04 LAB — BLOOD GAS, VENOUS
Acid-Base Excess: 2.3 mmol/L — ABNORMAL HIGH (ref 0.0–2.0)
Bicarbonate: 26.5 mmol/L (ref 20.0–28.0)
O2 Saturation: 95.5 %
Patient temperature: 37
pCO2, Ven: 39 mmHg — ABNORMAL LOW (ref 44–60)
pH, Ven: 7.44 — ABNORMAL HIGH (ref 7.25–7.43)
pO2, Ven: 71 mmHg — ABNORMAL HIGH (ref 32–45)

## 2022-11-04 LAB — COMPREHENSIVE METABOLIC PANEL
ALT: 18 U/L (ref 0–44)
AST: 30 U/L (ref 15–41)
Albumin: 4 g/dL (ref 3.5–5.0)
Alkaline Phosphatase: 66 U/L (ref 38–126)
Anion gap: 11 (ref 5–15)
BUN: 42 mg/dL — ABNORMAL HIGH (ref 8–23)
CO2: 23 mmol/L (ref 22–32)
Calcium: 8.8 mg/dL — ABNORMAL LOW (ref 8.9–10.3)
Chloride: 104 mmol/L (ref 98–111)
Creatinine, Ser: 2.22 mg/dL — ABNORMAL HIGH (ref 0.61–1.24)
GFR, Estimated: 30 mL/min — ABNORMAL LOW (ref >60)
Glucose, Bld: 186 mg/dL — ABNORMAL HIGH (ref 70–99)
Potassium: 4 mmol/L (ref 3.5–5.1)
Sodium: 138 mmol/L (ref 135–145)
Total Bilirubin: 1.5 mg/dL — ABNORMAL HIGH (ref 0.3–1.2)
Total Protein: 7 g/dL (ref 6.5–8.1)

## 2022-11-04 LAB — LACTIC ACID, PLASMA
Lactic Acid, Venous: 1.6 mmol/L (ref 0.5–1.9)
Lactic Acid, Venous: 1.2 mmol/L (ref 0.5–1.9)

## 2022-11-04 LAB — PROTIME-INR
INR: 1.6 — ABNORMAL HIGH (ref 0.8–1.2)
Prothrombin Time: 18.6 seconds — ABNORMAL HIGH (ref 11.4–15.2)

## 2022-11-04 LAB — APTT: aPTT: 37 seconds — ABNORMAL HIGH (ref 24–36)

## 2022-11-04 LAB — BRAIN NATRIURETIC PEPTIDE: B Natriuretic Peptide: 1216 pg/mL — ABNORMAL HIGH (ref 0.0–100.0)

## 2022-11-04 MED ORDER — SODIUM CHLORIDE 0.9 % IV SOLN
2.0000 g | INTRAVENOUS | Status: DC
  Administered 2022-11-04: 2 g via INTRAVENOUS
  Filled 2022-11-04: qty 20

## 2022-11-04 MED ORDER — SODIUM CHLORIDE 0.9 % IV SOLN
500.0000 mg | INTRAVENOUS | Status: DC
  Administered 2022-11-04: 500 mg via INTRAVENOUS
  Filled 2022-11-04: qty 5

## 2022-11-04 MED ORDER — ONDANSETRON HCL 4 MG/2ML IJ SOLN
4.0000 mg | Freq: Once | INTRAMUSCULAR | Status: AC
  Administered 2022-11-04: 4 mg via INTRAVENOUS
  Filled 2022-11-04: qty 2

## 2022-11-04 MED ORDER — ACETAMINOPHEN 325 MG PO TABS
650.0000 mg | ORAL_TABLET | Freq: Once | ORAL | Status: AC
  Administered 2022-11-04: 650 mg via ORAL
  Filled 2022-11-04: qty 2

## 2022-11-04 NOTE — ED Triage Notes (Signed)
Pt bib ems from home for ams. Pt's wife reports pt is normally a&ox4. This morning at breakfast pt accidentally took a double dose of his parkinson's meds which wife said made pt sleepy and has been sleeping all day. Pt is not able to answer more than yes or no questions. Pt is able to follow commands in triage. Pt does not wear oxygen at home, ems found him with a room air sat of 87%. Pt on 6L [place] in triage with a sat of 94%.

## 2022-11-04 NOTE — ED Provider Notes (Signed)
[facility] Provider Note    None    (approximate)   History   Shortness of Breath, Code Sepsis, and Altered Mental Status  Level V Cacveat: AMS  HPI  Matthew Howe is a 77 y.o. male who arrives as a code sepsis due to altered mental status fever.  Reportedly has had to change in behavior since this morning.  Was acting normal yesterday.  Has been very sluggish according to the wife was complaining of some shortness of breath.  Does not wear home oxygen.  Found to be hypoxic with EMS placed on supplemental oxygen.  He reportedly abdominal pain, nausea vomiting or diarrhea.  No recent antibiotics.     Physical Exam   Triage Vital Signs: ED Triage Vitals  Enc Vitals Group     BP --      Pulse Rate 11/04/22 2055 70     Resp 11/04/22 2055 (!) 32     Temp 11/04/22 2055 (!) 101.4 F (38.6 C)     Temp Source 11/04/22 2055 Oral     SpO2 11/04/22 2055 94 %     Weight 11/04/22 2054 256 lb (116.1 kg)     Height --      Head Circumference --      Peak Flow --      Pain Score --      Pain Loc --      Pain Edu? --      Excl. in [place]? --     Most recent vital signs: Vitals:   11/04/22 2230 11/04/22 2348  BP: 131/60   Pulse: 70   Resp: 18   Temp:  99 F (37.2 C)  SpO2: 99%      Constitutional: drowsy, protecting airway Eyes: Conjunctivae are normal.  Head: Atraumatic. Nose: No congestion/rhinnorhea. Mouth/Throat: Mucous membranes are moist.   Neck: Painless ROM.  Cardiovascular:   Good peripheral circulation. Respiratory: Hypoxia with mild tachypnea requiring supplemental oxygen Gastrointestinal: Soft and nontender.  Musculoskeletal:  no deformity, rle erythema and warmth consistent with cellulitis Neurologic:  MAE spontaneously. No gross focal neurologic deficits are appreciated.  Skin:  Skin is warm, dry and intact, cellulitis as above     ED Results / Procedures / Treatments   Labs (all labs ordered are listed, but only abnormal  results are displayed) Labs Reviewed  COMPREHENSIVE METABOLIC PANEL - Abnormal; Notable for the following components:      Result Value   Glucose, Bld 186 (*)    BUN 42 (*)    Creatinine, Ser 2.22 (*)    Calcium 8.8 (*)    Total Bilirubin 1.5 (*)    GFR, Estimated 30 (*)    All other components within normal limits  CBC WITH DIFFERENTIAL/PLATELET - Abnormal; Notable for the following components:   WBC 15.1 (*)    Neutro Abs 13.7 (*)    Lymphs Abs 0.3 (*)    Abs Immature Granulocytes 0.08 (*)    All other components within normal limits  PROTIME-INR - Abnormal; Notable for the following components:   Prothrombin Time 18.6 (*)    INR 1.6 (*)    All other components within normal limits  APTT - Abnormal; Notable for the following components:   aPTT 37 (*)    All other components within normal limits  BRAIN NATRIURETIC PEPTIDE - Abnormal; Notable for the following components:   B Natriuretic Peptide 1,216.0 (*)    All other components within normal limits  BLOOD  GAS, VENOUS - Abnormal; Notable for the following components:   pH, Ven 7.44 (*)    pCO2, Ven 39 (*)    pO2, Ven 71 (*)    Acid-Base Excess 2.3 (*)    All other components within normal limits  RESP PANEL BY RT-PCR (RSV, FLU A&B, COVID)  RVPGX2  CULTURE, BLOOD (ROUTINE X 2)  CULTURE, BLOOD (ROUTINE X 2)  LACTIC ACID, PLASMA  LACTIC ACID, PLASMA  URINALYSIS, W/ REFLEX TO CULTURE (INFECTION SUSPECTED)     EKG  ED ECG REPORT I, Merlyn Lot, the attending physician, personally viewed and interpreted this ECG.   Date: 11/05/2022  EKG Time: 20:57  Rate: 70  Rhythm: v paced  Axis: left  Intervals: prolonged qt  ST&T Change: no sgarbossa     RADIOLOGY Please see ED Course for my review and interpretation.  I personally reviewed all radiographic images ordered to evaluate for the above acute complaints and reviewed radiology reports and findings.  These findings were personally discussed with the patient.   Please see medical record for radiology report.    PROCEDURES:  Critical Care performed: Yes, see critical care procedure note(s)  Procedures   MEDICATIONS ORDERED IN ED: Medications  cefTRIAXone (ROCEPHIN) 2 g in sodium chloride 0.9 % 100 mL IVPB (0 g Intravenous Stopped 11/04/22 2247)  azithromycin (ZITHROMAX) 500 mg in sodium chloride 0.9 % 250 mL IVPB (500 mg Intravenous New Bag/Given 11/04/22 2215)  acetaminophen (TYLENOL) tablet 650 mg (650 mg Oral Given 11/04/22 2215)  ondansetron (ZOFRAN) injection 4 mg (4 mg Intravenous Given 11/04/22 2246)     IMPRESSION / MDM / [place] / ED COURSE  I reviewed the triage vital signs and the nursing notes.                              Differential diagnosis includes, but is not limited to, Dehydration, sepsis, pna, uti, hypoglycemia, cva, drug effect, withdrawal, encephalitis  Patient presenting to the ER for evaluation of symptoms as described above.  Based on symptoms, risk factors and considered above differential, this presenting complaint could reflect a potentially life-threatening illness therefore the patient will be placed on continuous pulse oximetry and telemetry for monitoring.  Laboratory evaluation will be sent to evaluate for the above complaints.  Patient is ill-appearing febrile tachypneic hypoxic will order septic workup.  Will order broad-spectrum antibiotics concerning for pneumonia.  Also has right lower extremity findings concerning for cellulitis.  He is on Eliquis and has been compliant with this not consistent with DVT.  Doubt PE for same.   Clinical Course as of 11/05/22 0011  Ludwig Clarks Nov 04, 2022  2204 Viral panel negative.  Given findings concerning for CHF and hypoxia will hold off on IV fluid resuscitation though this could be multifocal pneumonia no.  Lactate is negative.  Have ordered broad-spectrum antibiotics.  Will treat with Tylenol. [PR]  2210 Patient reassessed.  He is complaining of some nausea.   Will order CT imaging.  Orders IV Zofran. [PR]  Sat Nov 05, 2022  0010 Patient's mentation has significantly improved fever has defervesced.  His SIRS criteria is resolving.  He is still requiring some supplemental oxygen but it is being weaned off.  CT imaging without evidence of significant pulmonary edema though does have pulm arterial hypertension of findings.  Will give low-dose of Lasix. [PR]  0010 Consulted with hospitalist for admission. [PR]    Clinical Course User  Index [PR] Merlyn Lot, MD     FINAL CLINICAL IMPRESSION(S) / ED DIAGNOSES   Final diagnoses:  Altered mental status, unspecified altered mental status type  Sepsis with acute hypoxic respiratory failure without septic shock, due to unspecified organism [facility])  Cellulitis of right lower extremity     Rx / DC Orders   ED Discharge Orders     None        Note:  This document was prepared using Dragon voice recognition software and may include unintentional dictation errors.    Merlyn Lot, MD 11/05/22 [phone]

## 2022-11-04 NOTE — Sepsis Progress Note (Signed)
Elink monitoring for the code sepsis protocol.  

## 2022-11-04 NOTE — Code Documentation (Signed)
CODE SEPSIS - PHARMACY COMMUNICATION  **Broad Spectrum Antibiotics should be administered within 1 hour of Sepsis diagnosis**  Time Code Sepsis Called/Page Received: 2103  Antibiotics Ordered: ceftriaxone + azithromycin  Time of 1st antibiotic administration: [place] ,PharmD Clinical Pharmacist  11/04/2022  10:18 PM

## 2022-11-05 DIAGNOSIS — A419 Sepsis, unspecified organism: Secondary | ICD-10-CM | POA: Diagnosis present

## 2022-11-05 DIAGNOSIS — N1832 Chronic kidney disease, stage 3b: Secondary | ICD-10-CM | POA: Diagnosis present

## 2022-11-05 DIAGNOSIS — B953 Streptococcus pneumoniae as the cause of diseases classified elsewhere: Secondary | ICD-10-CM | POA: Diagnosis not present

## 2022-11-05 DIAGNOSIS — A409 Streptococcal sepsis, unspecified: Secondary | ICD-10-CM | POA: Diagnosis not present

## 2022-11-05 DIAGNOSIS — A403 Sepsis due to Streptococcus pneumoniae: Secondary | ICD-10-CM | POA: Diagnosis present

## 2022-11-05 DIAGNOSIS — M109 Gout, unspecified: Secondary | ICD-10-CM | POA: Diagnosis present

## 2022-11-05 DIAGNOSIS — I442 Atrioventricular block, complete: Secondary | ICD-10-CM | POA: Diagnosis present

## 2022-11-05 DIAGNOSIS — L03115 Cellulitis of right lower limb: Secondary | ICD-10-CM | POA: Diagnosis present

## 2022-11-05 DIAGNOSIS — L0390 Cellulitis, unspecified: Secondary | ICD-10-CM

## 2022-11-05 DIAGNOSIS — R9431 Abnormal electrocardiogram [ECG] [EKG]: Secondary | ICD-10-CM | POA: Diagnosis not present

## 2022-11-05 DIAGNOSIS — Z8739 Personal history of other diseases of the musculoskeletal system and connective tissue: Secondary | ICD-10-CM

## 2022-11-05 DIAGNOSIS — I5033 Acute on chronic diastolic (congestive) heart failure: Secondary | ICD-10-CM | POA: Diagnosis present

## 2022-11-05 DIAGNOSIS — G9341 Metabolic encephalopathy: Secondary | ICD-10-CM | POA: Diagnosis present

## 2022-11-05 DIAGNOSIS — I4819 Other persistent atrial fibrillation: Secondary | ICD-10-CM | POA: Diagnosis present

## 2022-11-05 DIAGNOSIS — R7881 Bacteremia: Secondary | ICD-10-CM | POA: Diagnosis not present

## 2022-11-05 DIAGNOSIS — Z950 Presence of cardiac pacemaker: Secondary | ICD-10-CM | POA: Diagnosis not present

## 2022-11-05 DIAGNOSIS — I130 Hypertensive heart and chronic kidney disease with heart failure and stage 1 through stage 4 chronic kidney disease, or unspecified chronic kidney disease: Secondary | ICD-10-CM | POA: Diagnosis present

## 2022-11-05 DIAGNOSIS — R4182 Altered mental status, unspecified: Secondary | ICD-10-CM | POA: Diagnosis not present

## 2022-11-05 DIAGNOSIS — Z7901 Long term (current) use of anticoagulants: Secondary | ICD-10-CM | POA: Diagnosis not present

## 2022-11-05 DIAGNOSIS — Z66 Do not resuscitate: Secondary | ICD-10-CM | POA: Diagnosis present

## 2022-11-05 DIAGNOSIS — I429 Cardiomyopathy, unspecified: Secondary | ICD-10-CM | POA: Diagnosis present

## 2022-11-05 DIAGNOSIS — N183 Chronic kidney disease, stage 3 unspecified: Secondary | ICD-10-CM | POA: Insufficient documentation

## 2022-11-05 DIAGNOSIS — K219 Gastro-esophageal reflux disease without esophagitis: Secondary | ICD-10-CM | POA: Insufficient documentation

## 2022-11-05 DIAGNOSIS — I351 Nonrheumatic aortic (valve) insufficiency: Secondary | ICD-10-CM | POA: Diagnosis not present

## 2022-11-05 DIAGNOSIS — J9601 Acute respiratory failure with hypoxia: Secondary | ICD-10-CM

## 2022-11-05 DIAGNOSIS — G629 Polyneuropathy, unspecified: Secondary | ICD-10-CM

## 2022-11-05 DIAGNOSIS — G20A1 Parkinson's disease without dyskinesia, without mention of fluctuations: Secondary | ICD-10-CM | POA: Diagnosis present

## 2022-11-05 DIAGNOSIS — I2510 Atherosclerotic heart disease of native coronary artery without angina pectoris: Secondary | ICD-10-CM | POA: Diagnosis present

## 2022-11-05 DIAGNOSIS — E785 Hyperlipidemia, unspecified: Secondary | ICD-10-CM | POA: Diagnosis present

## 2022-11-05 DIAGNOSIS — Z1152 Encounter for screening for COVID-19: Secondary | ICD-10-CM | POA: Diagnosis not present

## 2022-11-05 DIAGNOSIS — E669 Obesity, unspecified: Secondary | ICD-10-CM | POA: Diagnosis present

## 2022-11-05 DIAGNOSIS — I10 Essential (primary) hypertension: Secondary | ICD-10-CM | POA: Diagnosis not present

## 2022-11-05 DIAGNOSIS — I4891 Unspecified atrial fibrillation: Secondary | ICD-10-CM | POA: Diagnosis not present

## 2022-11-05 DIAGNOSIS — I5021 Acute systolic (congestive) heart failure: Secondary | ICD-10-CM | POA: Diagnosis not present

## 2022-11-05 DIAGNOSIS — R6520 Severe sepsis without septic shock: Secondary | ICD-10-CM | POA: Diagnosis present

## 2022-11-05 DIAGNOSIS — Z888 Allergy status to other drugs, medicaments and biological substances status: Secondary | ICD-10-CM | POA: Diagnosis not present

## 2022-11-05 DIAGNOSIS — Z6833 Body mass index (BMI) 33.0-33.9, adult: Secondary | ICD-10-CM | POA: Diagnosis not present

## 2022-11-05 DIAGNOSIS — I5031 Acute diastolic (congestive) heart failure: Secondary | ICD-10-CM | POA: Diagnosis not present

## 2022-11-05 LAB — CBC
HCT: 40.4 % (ref 39.0–52.0)
Hemoglobin: 13 g/dL (ref 13.0–17.0)
MCH: 30.8 pg (ref 26.0–34.0)
MCHC: 32.2 g/dL (ref 30.0–36.0)
MCV: 95.7 fL (ref 80.0–100.0)
Platelets: 166 10*3/uL (ref 150–400)
RBC: 4.22 MIL/uL (ref 4.22–5.81)
RDW: 14.9 % (ref 11.5–15.5)
WBC: 15.9 10*3/uL — ABNORMAL HIGH (ref 4.0–10.5)
nRBC: 0 % (ref 0.0–0.2)

## 2022-11-05 LAB — BLOOD CULTURE ID PANEL (REFLEXED) - BCID2

## 2022-11-05 LAB — BASIC METABOLIC PANEL
Anion gap: 10 (ref 5–15)
BUN: 44 mg/dL — ABNORMAL HIGH (ref 8–23)
CO2: 25 mmol/L (ref 22–32)
Calcium: 8.6 mg/dL — ABNORMAL LOW (ref 8.9–10.3)
Chloride: 105 mmol/L (ref 98–111)
Creatinine, Ser: 2.19 mg/dL — ABNORMAL HIGH (ref 0.61–1.24)
GFR, Estimated: 31 mL/min — ABNORMAL LOW (ref >60)
Glucose, Bld: 133 mg/dL — ABNORMAL HIGH (ref 70–99)
Potassium: 4.5 mmol/L (ref 3.5–5.1)
Sodium: 140 mmol/L (ref 135–145)

## 2022-11-05 LAB — CORTISOL-AM, BLOOD: Cortisol - AM: 17.9 ug/dL (ref 6.7–22.6)

## 2022-11-05 LAB — GLUCOSE, CAPILLARY: Glucose-Capillary: 99 mg/dL (ref 70–99)

## 2022-11-05 LAB — PROCALCITONIN: Procalcitonin: 6.35 ng/mL

## 2022-11-05 LAB — PROTIME-INR
INR: 1.7 — ABNORMAL HIGH (ref 0.8–1.2)
Prothrombin Time: 19.5 seconds — ABNORMAL HIGH (ref 11.4–15.2)

## 2022-11-05 MED ORDER — TRAZODONE HCL 50 MG PO TABS: 25.0000 mg | ORAL_TABLET | Freq: Every evening | ORAL | Status: DC | PRN

## 2022-11-05 MED ORDER — POLYETHYLENE GLYCOL 3350 17 G PO PACK: 17.0000 g | PACK | Freq: Every day | ORAL | Status: DC | PRN

## 2022-11-05 MED ORDER — POTASSIUM CHLORIDE CRYS ER 20 MEQ PO TBCR
40.0000 meq | EXTENDED_RELEASE_TABLET | Freq: Every day | ORAL | Status: DC
  Administered 2022-11-05 – 2022-11-10 (×6): 40 meq via ORAL
  Filled 2022-11-05 (×6): qty 2

## 2022-11-05 MED ORDER — LISINOPRIL 5 MG PO TABS
2.5000 mg | ORAL_TABLET | Freq: Every day | ORAL | Status: DC
  Administered 2022-11-05 – 2022-11-07 (×3): 2.5 mg via ORAL
  Filled 2022-11-05 (×3): qty 1

## 2022-11-05 MED ORDER — ONDANSETRON HCL 4 MG PO TABS: 4.0000 mg | ORAL_TABLET | Freq: Four times a day (QID) | ORAL | Status: DC | PRN

## 2022-11-05 MED ORDER — TORSEMIDE 20 MG PO TABS: 80.0000 mg | ORAL_TABLET | Freq: Every morning | ORAL | Status: DC

## 2022-11-05 MED ORDER — ACETAMINOPHEN 325 MG PO TABS
650.0000 mg | ORAL_TABLET | Freq: Four times a day (QID) | ORAL | Status: DC | PRN
  Administered 2022-11-05 – 2022-11-08 (×3): 650 mg via ORAL
  Filled 2022-11-05 (×3): qty 2

## 2022-11-05 MED ORDER — PANTOPRAZOLE SODIUM 40 MG PO TBEC
40.0000 mg | DELAYED_RELEASE_TABLET | Freq: Every day | ORAL | Status: DC
  Administered 2022-11-05 – 2022-11-10 (×6): 40 mg via ORAL
  Filled 2022-11-05 (×6): qty 1

## 2022-11-05 MED ORDER — FLECAINIDE ACETATE 100 MG PO TABS
100.0000 mg | ORAL_TABLET | Freq: Two times a day (BID) | ORAL | Status: DC
  Administered 2022-11-05 – 2022-11-09 (×9): 100 mg via ORAL
  Filled 2022-11-05 (×9): qty 1

## 2022-11-05 MED ORDER — MAGNESIUM OXIDE 400 MG PO TABS
400.0000 mg | ORAL_TABLET | Freq: Every day | ORAL | Status: DC
  Administered 2022-11-05 – 2022-11-09 (×5): 400 mg via ORAL
  Filled 2022-11-05 (×9): qty 1

## 2022-11-05 MED ORDER — MORPHINE SULFATE (PF) 2 MG/ML IV SOLN: 2.0000 mg | INTRAVENOUS | Status: DC | PRN

## 2022-11-05 MED ORDER — ONDANSETRON HCL 4 MG/2ML IJ SOLN: 4.0000 mg | Freq: Four times a day (QID) | INTRAMUSCULAR | Status: DC | PRN

## 2022-11-05 MED ORDER — METOLAZONE 2.5 MG PO TABS: 2.5000 mg | ORAL_TABLET | Freq: Every day | ORAL | Status: DC | PRN

## 2022-11-05 MED ORDER — FUROSEMIDE 10 MG/ML IJ SOLN
40.0000 mg | Freq: Two times a day (BID) | INTRAMUSCULAR | Status: DC
  Administered 2022-11-05 – 2022-11-08 (×7): 40 mg via INTRAVENOUS
  Filled 2022-11-05 (×7): qty 4

## 2022-11-05 MED ORDER — ALLOPURINOL 100 MG PO TABS
100.0000 mg | ORAL_TABLET | Freq: Every day | ORAL | Status: DC
  Administered 2022-11-05 – 2022-11-10 (×6): 100 mg via ORAL
  Filled 2022-11-05 (×6): qty 1

## 2022-11-05 MED ORDER — MAGNESIUM HYDROXIDE 400 MG/5ML PO SUSP
30.0000 mL | Freq: Every day | ORAL | Status: DC | PRN
  Administered 2022-11-10: 30 mL via ORAL
  Filled 2022-11-05: qty 30

## 2022-11-05 MED ORDER — ADULT MULTIVITAMIN W/MINERALS CH
1.0000 | ORAL_TABLET | Freq: Every day | ORAL | Status: DC
  Administered 2022-11-05 – 2022-11-10 (×6): 1 via ORAL
  Filled 2022-11-05 (×6): qty 1

## 2022-11-05 MED ORDER — VANCOMYCIN HCL IN DEXTROSE 1-5 GM/200ML-% IV SOLN: 1000.0000 mg | Freq: Once | INTRAVENOUS | Status: DC

## 2022-11-05 MED ORDER — PRAMIPEXOLE DIHYDROCHLORIDE 1 MG PO TABS
1.0000 mg | ORAL_TABLET | Freq: Four times a day (QID) | ORAL | Status: DC
  Administered 2022-11-05 – 2022-11-10 (×20): 1 mg via ORAL
  Filled 2022-11-05 (×24): qty 1

## 2022-11-05 MED ORDER — VANCOMYCIN HCL 1250 MG/250ML IV SOLN
1250.0000 mg | INTRAVENOUS | Status: DC
  Administered 2022-11-05: 1250 mg via INTRAVENOUS
  Filled 2022-11-05: qty 250

## 2022-11-05 MED ORDER — SODIUM CHLORIDE 0.9 % IV SOLN: 2.0000 g | Freq: Once | INTRAVENOUS | Status: DC

## 2022-11-05 MED ORDER — SELEGILINE HCL 5 MG PO TABS
5.0000 mg | ORAL_TABLET | Freq: Two times a day (BID) | ORAL | Status: DC
  Administered 2022-11-05 – 2022-11-10 (×10): 5 mg via ORAL
  Filled 2022-11-05 (×13): qty 1

## 2022-11-05 MED ORDER — SODIUM CHLORIDE 0.9 % IV SOLN
2.0000 g | Freq: Two times a day (BID) | INTRAVENOUS | Status: DC
  Administered 2022-11-05 (×2): 2 g via INTRAVENOUS
  Filled 2022-11-05 (×3): qty 12.5

## 2022-11-05 MED ORDER — BISACODYL 5 MG PO TBEC
5.0000 mg | DELAYED_RELEASE_TABLET | Freq: Two times a day (BID) | ORAL | Status: DC
  Administered 2022-11-05 – 2022-11-10 (×9): 5 mg via ORAL
  Filled 2022-11-05 (×11): qty 1

## 2022-11-05 MED ORDER — APIXABAN 5 MG PO TABS
5.0000 mg | ORAL_TABLET | Freq: Two times a day (BID) | ORAL | Status: DC
  Administered 2022-11-05 – 2022-11-10 (×11): 5 mg via ORAL
  Filled 2022-11-05 (×11): qty 1

## 2022-11-05 MED ORDER — SENNOSIDES-DOCUSATE SODIUM 8.6-50 MG PO TABS
2.0000 | ORAL_TABLET | Freq: Two times a day (BID) | ORAL | Status: DC
  Administered 2022-11-05 – 2022-11-10 (×11): 2 via ORAL
  Filled 2022-11-05 (×11): qty 2

## 2022-11-05 MED ORDER — CARVEDILOL 3.125 MG PO TABS
3.1250 mg | ORAL_TABLET | Freq: Two times a day (BID) | ORAL | Status: DC
  Administered 2022-11-05 – 2022-11-10 (×10): 3.125 mg via ORAL
  Filled 2022-11-05 (×11): qty 1

## 2022-11-05 MED ORDER — SODIUM CHLORIDE 0.9 % IV SOLN: INTRAVENOUS | Status: DC

## 2022-11-05 MED ORDER — ACETAMINOPHEN 650 MG RE SUPP: 650.0000 mg | Freq: Four times a day (QID) | RECTAL | Status: DC | PRN

## 2022-11-05 MED ORDER — VANCOMYCIN HCL 2000 MG/400ML IV SOLN
2000.0000 mg | Freq: Once | INTRAVENOUS | Status: AC
  Administered 2022-11-05: 2000 mg via INTRAVENOUS
  Filled 2022-11-05: qty 400

## 2022-11-05 MED ORDER — RED YEAST RICE 600 MG PO CAPS: 600.0000 mg | ORAL_CAPSULE | Freq: Every day | ORAL | Status: DC

## 2022-11-05 NOTE — Assessment & Plan Note (Addendum)
Renal functions consistent with CKD stage IIIb, appears stable - We will monitor his renal functions patient given gentle hydration and monitoring for his chronic diastolic CHF

## 2022-11-05 NOTE — Progress Notes (Signed)
PHARMACY -  BRIEF ANTIBIOTIC NOTE   Pharmacy has received consult(s) for Vancomycin from an ED provider.  The patient's profile has been reviewed for ht/wt/allergies/indication/available labs.    One time order(s) placed for Vancomycin 2 gm per pt wt > 100 kg.  Further antibiotics/pharmacy consults should be ordered by admitting physician if indicated.                       Thank you, Renda Rolls, PharmD, [facility] 11/05/2022 12:16 AM

## 2022-11-05 NOTE — Assessment & Plan Note (Signed)
-  We will continue PPI therapy.

## 2022-11-05 NOTE — Assessment & Plan Note (Signed)
-  We will continue his antihypertensives with holding parameters.

## 2022-11-05 NOTE — Progress Notes (Signed)
PROGRESS NOTE    KOLTON KIENLE  ERX:[id number] DOB: 1946-03-14 DOA: 11/04/2022 PCP: Leonel Ramsay, MD   Brief Narrative:  DEAIRE MCWHIRTER is a 77 y.o. Caucasian male with medical history significant for osteoarthritis, atrial fibrillation, CHF, CKD, complete heart block status post PPM, GERD, hypertension, dyslipidemia, Parkinson's disease and coronary artery disease, who presented to the emergency room with acute onset of generalized weakness and fall while he was trying to get to the bathroom.  His wife stated he never lost consciousness.  She could not get him up.  She tried to use the help of the friend and still could not get him up.  When EMS arrived 3 ladies could not help getting him up and they had called the fire department.  The patient was noted to have a fever with a Tmax of 104.5 with no chills.  No chest pain or dyspnea or cough or wheezing.  No orthopnea or worsening lower extremity edema.  He was noted to have right leg erythema and warmth extending to his ankle.  No dysuria, oliguria or hematuria or flank pain.  The patient's last 2D echo revealed an EF of 50 to 55% with indeterminate diastolic function on 06/15/931.   ED Course: When he came to the ER, temperature 101.4 with a respiratory rate of 32 and pulse currently 94% on 6 L of O2 by nasal cannula.  BUN is 42 and creatinine 2.22 with a glucose of 186 and calcium 8.8 total bili 1.5.  ABG showed pH 7.44 with pO2 of the 71 and pCO2 of 39 and O2 sat of 95.5%.  BNP was 1260 and lactic acid was 1.6 and later 1.2.  CBC showed leukocytosis of 15.1.  INR is 1.6 with a PT of 18.6 and PTT 37.  Influenza RSV and COVID-19 PCR came back negative.  Blood cultures were drawn.   EKG as reviewed by me : Ventricular paced rhythm with a rate of 77. Imaging: Renal stone CT scan revealed no acute abnormality of the abdomen or pelvis.  It showed mild and nonspecific subcutaneous stranding along the anterior right thigh that is nonspecific and could  be related to edema or contusion or infection.  Showed borderline enlarged right inguinal lymph node favoring reactive lymph nodes.  Noncontrasted CT scan revealed motion degraded images with no evidence for acute intracranial normality.  Chest CT without contrast revealed the following: 1. No evidence of pneumonia or pulmonary edema. 2. Moderate cardiomegaly with left chamber predominance, mildly prominent central pulmonary veins, and a prominent pulmonary trunk indicating arterial hypertension. 3. Aortic atherosclerosis with slight dilatation of the aortic root and ascending segment. Follow-up CTA or MRA recommended in 12 months. This recommendation follows 2010 ACCF/AHA/AATS/ACR/ASA/SCA/SCAI/SIR/STS/SVM Guidelines for the Diagnosis and Management of Patients with Thoracic Aortic Disease. Circulation. 2010; 121: I712-W580. Aortic aneurysm NOS (ICD10-I71.9). 4. Chronic subpleural reticulation in the lung bases. 5. Extensive multilevel thoracic spine bridging enthesopathy of DISH.   The patient was given 650 mg p.o. Tylenol, 4 mg of IV Zofran, IV Rocephin and Zithromax initially and later IV vancomycin.  He will be admitted to a medical telemetry bed for further evaluation and management.  Assessment & Plan:   Principal Problem:   Sepsis due to cellulitis [facility]) Active Problems:   Acute respiratory failure with hypoxia (HCC)   Essential hypertension   Acute on chronic diastolic CHF (congestive heart failure) (HCC)   Paroxysmal atrial fibrillation (HCC)   GERD without esophagitis   Gout   Peripheral  neuropathy   Stage III chronic kidney disease (HCC)   Cellulitis of right lower extremity  Sepsis secondary to right lower extremity cellulitis: Met criteria for sepsis based on leukocytosis, fever and tachypnea.  Slight improvement compared to yesterday.  Continue cefepime and vancomycin.  Acute respiratory failure with hypoxia secondary to acute on chronic diastolic congestive heart  failure: Some vascular congestion on the chest x-ray and elevated BNP, likely the source of acute respiratory failure with hypoxia, currently requiring only 2 L of oxygen to keep his oxygen over 90% but I believe there is some atelectasis as well as patient does not seem to be taking deep breaths.  He is on IV Lasix which I will continue.  Last echo was done many years ago so I will repeat echo as well.  Strict I's and O's, low-sodium diet.  Daily weight.  Essential hypertension - Blood pressure very well-controlled, continue home dose of Coreg and lisinopril.     Stage IIIb chronic kidney disease ([place]) - At baseline, monitor while he is on Lasix.   Peripheral neuropathy - We will continue Lyrica.   Gout - We will continue allopurinol.   GERD without esophagitis - We will continue PPI therapy.   Paroxysmal atrial fibrillation (HCC) - We will continue Eliquis and flecainide.  DVT prophylaxis: Eliquis   Code Status: DNR  Family Communication:  None present at bedside.   Status is: Inpatient Remains inpatient appropriate because: Needs further IV diuresis and IV antibiotics.   Estimated body mass index is 34.72 kg/m as calculated from the following:   Height as of 08/01/22: 6' (1.829 m).   Weight as of this encounter: 116.1 kg.    Nutritional Assessment: Body mass index is 34.72 kg/m.[name] Seen by dietician.  I agree with the assessment and plan as outlined below: Nutrition Status:        . Skin Assessment: I have examined the patient's skin and I agree with the wound assessment as performed by the wound care RN as outlined below:    Consultants:  None none  Procedures:  none  Antimicrobials:  Anti-infectives (From admission, onward)    Start     Dose/Rate Route Frequency Ordered Stop   11/05/22 2200  vancomycin (VANCOREADY) IVPB 1250 mg/250 mL        1,250 mg 166.7 mL/hr over 90 Minutes Intravenous Every 24 hours 11/05/22 0304 11/12/22 2159   11/05/22 1000   ceFEPIme (MAXIPIME) 2 g in sodium chloride 0.9 % 100 mL IVPB  Status:  Discontinued        2 g 200 mL/hr over 30 Minutes Intravenous  Once 11/05/22 0144 11/05/22 0300   11/05/22 1000  ceFEPIme (MAXIPIME) 2 g in sodium chloride 0.9 % 100 mL IVPB        2 g 200 mL/hr over 30 Minutes Intravenous Every 12 hours 11/05/22 0300 11/12/22 0959   11/05/22 0145  vancomycin (VANCOCIN) IVPB 1000 mg/200 mL premix  Status:  Discontinued        1,000 mg 200 mL/hr over 60 Minutes Intravenous  Once 11/05/22 0144 11/05/22 0202   11/05/22 0030  vancomycin (VANCOREADY) IVPB 2000 mg/400 mL        2,000 mg 200 mL/hr over 120 Minutes Intravenous  Once 11/05/22 0016 11/05/22 0607   11/04/22 2200  cefTRIAXone (ROCEPHIN) 2 g in sodium chloride 0.9 % 100 mL IVPB  Status:  Discontinued        2 g 200 mL/hr over 30 Minutes Intravenous Every 24  hours 11/04/22 2157 11/05/22 0148   11/04/22 2200  azithromycin (ZITHROMAX) 500 mg in sodium chloride 0.9 % 250 mL IVPB  Status:  Discontinued        500 mg 250 mL/hr over 60 Minutes Intravenous Every 24 hours 11/04/22 2157 11/05/22 8250         Subjective: Patient seen and examined.  He was sleepy and thus he was likely oriented however knew that he was in the hospital and did not have any complaints.  Objective: Vitals:   11/05/22 0130 11/05/22 0140 11/05/22 0229 11/05/22 0732  BP: 122/64  135/72 116/60  Pulse: 70  70 70  Resp:   (!) 22 15  Temp:   98.5 F (36.9 C) 98.4 F (36.9 C)  TempSrc:      SpO2: 96% 98% 100% 96%  Weight:        Intake/Output Summary (Last 24 hours) at 11/05/2022 0908 Last data filed at 11/05/2022 0370 Gross per 24 hour  Intake 400 ml  Output --  Net 400 ml   Filed Weights   11/04/22 2054  Weight: 116.1 kg    Examination:  General exam: Appears calm and comfortable  Respiratory system: Clear to auscultation. Respiratory effort normal. Cardiovascular system: S1 & S2 heard, RRR. No JVD, murmurs, rubs, gallops or clicks. No pedal  edema. Gastrointestinal system: Abdomen is nondistended, soft and nontender. No organomegaly or masses felt. Normal bowel sounds heard. Central nervous system: Alert and oriented x 2. No focal neurological deficits. Extremities: Right lower extremity erythema.   Data Reviewed: I have personally reviewed following labs and imaging studies  CBC: Recent Labs  Lab 11/04/22 2103 11/05/22 0506  WBC 15.1* 15.9*  NEUTROABS 13.7*  --   HGB 13.9 13.0  HCT 43.5 40.4  MCV 95.0 95.7  PLT 193 488   Basic Metabolic Panel: Recent Labs  Lab 11/04/22 2103 11/05/22 0506  NA 138 140  K 4.0 4.5  CL 104 105  CO2 23 25  GLUCOSE 186* 133*  BUN 42* 44*  CREATININE 2.22* 2.19*  CALCIUM 8.8* 8.6*   GFR: Estimated Creatinine Clearance: 38.3 mL/min (A) (by C-G formula based on SCr of 2.19 mg/dL (H)). Liver Function Tests: Recent Labs  Lab 11/04/22 2103  AST 30  ALT 18  ALKPHOS 66  BILITOT 1.5*  PROT 7.0  ALBUMIN 4.0   No results for input(s): "LIPASE", "AMYLASE" in the last 168 hours. No results for input(s): "AMMONIA" in the last 168 hours. Coagulation Profile: Recent Labs  Lab 11/04/22 2236 11/05/22 0506  INR 1.6* 1.7*   Cardiac Enzymes: No results for input(s): "CKTOTAL", "CKMB", "CKMBINDEX", "TROPONINI" in the last 168 hours. BNP (last 3 results) No results for input(s): "PROBNP" in the last 8760 hours. HbA1C: No results for input(s): "HGBA1C" in the last 72 hours. CBG: No results for input(s): "GLUCAP" in the last 168 hours. Lipid Profile: No results for input(s): "CHOL", "HDL", "LDLCALC", "TRIG", "CHOLHDL", "LDLDIRECT" in the last 72 hours. Thyroid Function Tests: No results for input(s): "TSH", "T4TOTAL", "FREET4", "T3FREE", "THYROIDAB" in the last 72 hours. Anemia Panel: No results for input(s): "VITAMINB12", "FOLATE", "FERRITIN", "TIBC", "IRON", "RETICCTPCT" in the last 72 hours. Sepsis Labs: Recent Labs  Lab 11/04/22 2103 11/04/22 2236 11/05/22 0506   PROCALCITON  --   --  6.35  LATICACIDVEN 1.6 1.2  --     Recent Results (from the past 240 hour(s))  Resp panel by RT-PCR (RSV, Flu A&B, Covid) Anterior Nasal Swab     Status:  None   Collection Time: 11/04/22  9:03 PM   Specimen: Anterior Nasal Swab  Result Value Ref Range Status   SARS Coronavirus 2 by RT PCR NEGATIVE NEGATIVE Final    Comment: (NOTE) SARS-CoV-2 target nucleic acids are NOT DETECTED.  The SARS-CoV-2 RNA is generally detectable in upper respiratory specimens during the acute phase of infection. The lowest concentration of SARS-CoV-2 viral copies this assay can detect is 138 copies/mL. A negative result does not preclude SARS-Cov-2 infection and should not be used as the sole basis for treatment or other patient management decisions. A negative result may occur with  improper specimen collection/handling, submission of specimen other than nasopharyngeal swab, presence of viral mutation(s) within the areas targeted by this assay, and inadequate number of viral copies(<138 copies/mL). A negative result must be combined with clinical observations, patient history, and epidemiological information. The expected result is Negative.  Fact Sheet for Patients:  EntrepreneurPulse.com.au  Fact Sheet for Healthcare Providers:  IncredibleEmployment.be  This test is no t yet approved or cleared by the [place] FDA and  has been authorized for detection and/or diagnosis of SARS-CoV-2 by FDA under an Emergency Use Authorization (EUA). This EUA will remain  in effect (meaning this test can be used) for the duration of the COVID-19 declaration under Section 564(b)(1) of the Act, 21 U.S.C.section 360bbb-3(b)(1), unless the authorization is terminated  or revoked sooner.       Influenza A by PCR NEGATIVE NEGATIVE Final   Influenza B by PCR NEGATIVE NEGATIVE Final    Comment: (NOTE) The Xpert Xpress SARS-CoV-2/FLU/RSV plus assay is  intended as an aid in the diagnosis of influenza from Nasopharyngeal swab specimens and should not be used as a sole basis for treatment. Nasal washings and aspirates are unacceptable for Xpert Xpress SARS-CoV-2/FLU/RSV testing.  Fact Sheet for Patients: EntrepreneurPulse.com.au  Fact Sheet for Healthcare Providers: IncredibleEmployment.be  This test is not yet approved or cleared by the [place] FDA and has been authorized for detection and/or diagnosis of SARS-CoV-2 by FDA under an Emergency Use Authorization (EUA). This EUA will remain in effect (meaning this test can be used) for the duration of the COVID-19 declaration under Section 564(b)(1) of the Act, 21 U.S.C. section 360bbb-3(b)(1), unless the authorization is terminated or revoked.     Resp Syncytial Virus by PCR NEGATIVE NEGATIVE Final    Comment: (NOTE) Fact Sheet for Patients: EntrepreneurPulse.com.au  Fact Sheet for Healthcare Providers: IncredibleEmployment.be  This test is not yet approved or cleared by the [place] FDA and has been authorized for detection and/or diagnosis of SARS-CoV-2 by FDA under an Emergency Use Authorization (EUA). This EUA will remain in effect (meaning this test can be used) for the duration of the COVID-19 declaration under Section 564(b)(1) of the Act, 21 U.S.C. section 360bbb-3(b)(1), unless the authorization is terminated or revoked.  Performed at [facility], [address] [place], [place] [address]   Blood Culture (routine x 2)     Status: None (Preliminary result)   Collection Time: 11/04/22  9:03 PM   Specimen: BLOOD  Result Value Ref Range Status   Specimen Description BLOOD BLOOD RIGHT FOREARM  Final   Special Requests   Final    BOTTLES DRAWN AEROBIC AND ANAEROBIC Blood Culture adequate volume   Culture   Final    NO GROWTH < 12 HOURS Performed at [facility], [address], [place], [place] [address]    Report Status PENDING  Incomplete  Blood  Culture (routine x 2)     Status: None (Preliminary result)   Collection Time: 11/04/22  9:03 PM   Specimen: BLOOD  Result Value Ref Range Status   Specimen Description BLOOD BLOOD LEFT HAND  Final   Special Requests   Final    BOTTLES DRAWN AEROBIC ONLY Blood Culture adequate volume   Culture   Final    NO GROWTH < 12 HOURS Performed at [facility], [address], [place], [place] [address]    Report Status PENDING  Incomplete     Radiology Studies: CT CHEST WO CONTRAST  Result Date: 11/04/2022 CLINICAL DATA:  Pneumonia, complication is suspected. EXAM: CT CHEST WITHOUT CONTRAST TECHNIQUE: Multidetector CT imaging of the chest was performed following the standard protocol without IV contrast. RADIATION DOSE REDUCTION: This exam was performed according to the departmental dose-optimization program which includes automated exposure control, adjustment of the mA and/or kV according to patient size and/or use of iterative reconstruction technique. COMPARISON:  Portable chest today, chest radiograph 02/01/2016, and CTA chest 05/07/2020. FINDINGS: Cardiovascular: There is moderate cardiomegaly with a left chamber predominance, increased since 2021. Spray artifact again noted due to a left chest implanted pacemaker and wiring in the right atrium and ventricle. There are patchy calcifications in the LAD coronary artery. No pericardial effusion is seen. Pulmonary trunk is ectatic measuring 3.7 cm indicating arterial hypertension, previously 3.2 cm. There are mild aortic calcific plaques, descending tortuosity. Normal great vessel branching with trace calcification distal innominate artery. There is slight dilatation of the aortic root at the sinuses of Valsalva measuring 4 cm, slightly dilated ascending aortic segment measuring 4.1 cm, previously 3.9 cm. The arch segment measures 3.4 cm. The descending segment  measures 3.3 cm. Central pulmonary veins are mildly prominent as well. Mediastinum/Nodes: No intrathoracic or axillary adenopathy is seen without contrast. Limited view of the hila. The thyroid gland, thoracic trachea, thoracic esophagus are unremarkable. Lungs/Pleura: No pleural effusion, thickening or pneumothorax. No pulmonary edema is seen. Respiratory motion limits evaluation of the lungs. Posterior atelectasis again is noted in the lower lobes. There is mild stable subpleural reticulation in the bases. No infiltrate or nodule is seen. The main bronchi are clear. Upper Abdomen: No acute abnormality. Stable subcentimeter hypodensity in the left lobe of the liver most likely due to assist. Stable mild nodular thickening of the adrenal glands. Musculoskeletal: Extensive multilevel thoracic spine bridging enthesopathy of DISH. No acute or other significant osseous findings. The ribcage is intact. IMPRESSION: 1. No evidence of pneumonia or pulmonary edema. 2. Moderate cardiomegaly with left chamber predominance, mildly prominent central pulmonary veins, and a prominent pulmonary trunk indicating arterial hypertension. 3. Aortic atherosclerosis with slight dilatation of the aortic root and ascending segment. Follow-up CTA or MRA recommended in 12 months. This recommendation follows 2010 ACCF/AHA/AATS/ACR/ASA/SCA/SCAI/SIR/STS/SVM Guidelines for the Diagnosis and Management of Patients with Thoracic Aortic Disease. Circulation. 2010; 121: G867-Y195. Aortic aneurysm NOS (ICD10-I71.9). 4. Chronic subpleural reticulation in the lung bases. 5. Extensive multilevel thoracic spine bridging enthesopathy of DISH. Aortic Atherosclerosis (ICD10-I70.0). Electronically Signed   By: Telford Nab M.D.   On: 11/04/2022 23:33   CT Renal Stone Study  Result Date: 11/04/2022 CLINICAL DATA:  Altered mental status after double dose of Parkinson's medication. Abdominal/flank pain. Stones suspected. EXAM: CT ABDOMEN AND PELVIS WITHOUT  CONTRAST TECHNIQUE: Multidetector CT imaging of the abdomen and pelvis was performed following the standard protocol without IV contrast. RADIATION DOSE REDUCTION: This exam was performed according to the departmental dose-optimization program  which includes automated exposure control, adjustment of the mA and/or kV according to patient size and/or use of iterative reconstruction technique. COMPARISON:  CT 12/29/2014 FINDINGS: Lower chest: Cardiomegaly. Partially visualized ICD. Respiratory motion obscures the lung bases. Hepatobiliary: No solid hepatic lesion. Unremarkable gallbladder. No biliary dilation. Pancreas: Unremarkable. Spleen: Unremarkable. Adrenals/Urinary Tract: Normal adrenal glands. Low-attenuation lesions in the kidneys are statistically likely to represent cysts. No follow-up is required. No urinary calculi or hydronephrosis. Unremarkable bladder. Stomach/Bowel: Normal caliber large and small bowel. Colonic diverticulosis without diverticulitis. The appendix is not definitively visualized. Unremarkable stomach. Vascular/Lymphatic: Prominent borderline enlarged right inguinal lymph nodes measuring up to 1.2 cm in short axis (3/85). Aortic atherosclerotic calcification. Reproductive: Unremarkable. Other: Nonspecific mild stranding in the anterior right thigh. Musculoskeletal: Thoracolumbar spondylosis. Posterior fusion L4-L5. No acute fracture. IMPRESSION: No acute abnormality in the abdomen or pelvis. Mild nonspecific subcutaneous stranding about the anterior right thigh is nonspecific and could be due to edema, contusion or infection. Borderline enlarged right inguinal lymph nodes, favored reactive. Electronically Signed   By: Placido Sou M.D.   On: 11/04/2022 23:22   CT HEAD WO CONTRAST (5MM)  Result Date: 11/04/2022 CLINICAL DATA:  Altered mental status, accidental overdose of Parkinson's medication EXAM: CT HEAD WITHOUT CONTRAST TECHNIQUE: Contiguous axial images were obtained from the  base of the skull through the vertex without intravenous contrast. RADIATION DOSE REDUCTION: This exam was performed according to the departmental dose-optimization program which includes automated exposure control, adjustment of the mA and/or kV according to patient size and/or use of iterative reconstruction technique. COMPARISON:  11/02/2012 FINDINGS: Motion degraded images. Brain: No evidence of acute infarction, hemorrhage, hydrocephalus, extra-axial collection or mass lesion/mass effect. Vascular: No hyperdense vessel or unexpected calcification. Skull: Normal. Negative for fracture or focal lesion. Sinuses/Orbits: The visualized paranasal sinuses are essentially clear. The mastoid air cells are unopacified. Other: None. IMPRESSION: Motion degraded images. No evidence of acute intracranial abnormality. Electronically Signed   By: Julian Hy M.D.   On: 11/04/2022 23:13   DG Chest Port 1 View  Result Date: 11/04/2022 CLINICAL DATA:  Hypoxia, sepsis. EXAM: PORTABLE CHEST 1 VIEW COMPARISON:  February 01, 2016. FINDINGS: Mild cardiomegaly is noted with central pulmonary vascular congestion. Mild right basilar atelectasis or infiltrate is noted. Left-sided pacemaker is unchanged. Bony thorax is unremarkable. IMPRESSION: Mild cardiomegaly with mild central pulmonary vascular congestion. Right basilar atelectasis or infiltrate is noted. Followup PA and lateral chest X-ray is recommended in 3-4 weeks following trial of antibiotic therapy to ensure resolution and exclude underlying malignancy. Electronically Signed   By: Marijo Conception M.D.   On: 11/04/2022 21:32    Scheduled Meds:  allopurinol  100 mg Oral Daily   apixaban  5 mg Oral BID   bisacodyl  5 mg Oral BID   carvedilol  3.125 mg Oral BID WC   flecainide  100 mg Oral BID   furosemide  40 mg Intravenous Q12H   lisinopril  2.5 mg Oral Daily   magnesium oxide  400 mg Oral QHS   multivitamin with minerals  1 tablet Oral Daily   pantoprazole  40  mg Oral Daily   potassium chloride SA  40 mEq Oral Daily   pramipexole  1 mg Oral QID   selegiline  5 mg Oral BID WC   senna-docusate  2 tablet Oral BID   Continuous Infusions:  ceFEPime (MAXIPIME) IV 2 g (11/05/22 0841)   vancomycin       LOS: 0 days  Darliss Cheney, MD [facility]  11/05/2022, 9:08 AM   *Please note that this is a verbal dictation therefore any spelling or grammatical errors are due to the "[place] [facility]" system interpretation.  Please page via [place] and do not message via secure chat for urgent patient care matters. Secure chat can be used for non urgent patient care matters.  How to contact the [facility] Attending or Consulting provider [place] or covering provider during after hours [place], for this patient?  Check the care team in [facility] and look for a) attending/consulting [facility] provider listed and b) the [facility] team listed. Page or secure chat [phone]. Log into [email or web address] and use [place]'s universal password to access. If you do not have the password, please contact the hospital operator. Locate the [facility] provider you are looking for under [facility] and page to a number that you can be directly reached. If you still have difficulty reaching the provider, please page the [facility] (Director on Call) for the Hospitalists listed on amion for assistance.
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
## 2022-11-05 NOTE — Assessment & Plan Note (Signed)
-  We will continue Eliquis and flecainide.

## 2022-11-05 NOTE — Consult Note (Signed)
PHARMACY - PHYSICIAN COMMUNICATION CRITICAL VALUE ALERT - BLOOD CULTURE IDENTIFICATION (BCID)  Matthew Howe is an 77 y.o. male who presented to [facility] on 11/04/2022 with a chief complaint of altered mental status, shortness of breath, as a code sepsis patient  Assessment:  1/4 (aerobic bottle) strep pneumoniae (no res gene)   Name of physician (or Provider) Contacted: Darliss Cheney, MD  Current antibiotics: Cefepime and Vancomycin  Changes to prescribed antibiotics recommended:  Recommend deescalating to Ceftriaxone 2gm IV daily  Results for orders placed or performed during the hospital encounter of 11/04/22  Blood Culture ID Panel (Reflexed) (Collected: 11/04/2022  9:03 PM)  Result Value Ref Range   Enterococcus faecalis NOT DETECTED NOT DETECTED   Enterococcus Faecium NOT DETECTED NOT DETECTED   Listeria monocytogenes NOT DETECTED NOT DETECTED   Staphylococcus species NOT DETECTED NOT DETECTED   Staphylococcus aureus (BCID) NOT DETECTED NOT DETECTED   Staphylococcus epidermidis NOT DETECTED NOT DETECTED   Staphylococcus lugdunensis NOT DETECTED NOT DETECTED   Streptococcus species DETECTED (A) NOT DETECTED   Streptococcus agalactiae NOT DETECTED NOT DETECTED   Streptococcus pneumoniae DETECTED (A) NOT DETECTED   Streptococcus pyogenes NOT DETECTED NOT DETECTED   A.calcoaceticus-baumannii NOT DETECTED NOT DETECTED   Bacteroides fragilis NOT DETECTED NOT DETECTED   Enterobacterales NOT DETECTED NOT DETECTED   Enterobacter cloacae complex NOT DETECTED NOT DETECTED   Escherichia coli NOT DETECTED NOT DETECTED   Klebsiella aerogenes NOT DETECTED NOT DETECTED   Klebsiella oxytoca NOT DETECTED NOT DETECTED   Klebsiella pneumoniae NOT DETECTED NOT DETECTED   Proteus species NOT DETECTED NOT DETECTED   Salmonella species NOT DETECTED NOT DETECTED   Serratia marcescens NOT DETECTED NOT DETECTED   Haemophilus influenzae NOT DETECTED NOT DETECTED   Neisseria meningitidis NOT  DETECTED NOT DETECTED   Pseudomonas aeruginosa NOT DETECTED NOT DETECTED   Stenotrophomonas maltophilia NOT DETECTED NOT DETECTED   Candida albicans NOT DETECTED NOT DETECTED   Candida auris NOT DETECTED NOT DETECTED   Candida glabrata NOT DETECTED NOT DETECTED   Candida krusei NOT DETECTED NOT DETECTED   Candida parapsilosis NOT DETECTED NOT DETECTED   Candida tropicalis NOT DETECTED NOT DETECTED   Cryptococcus neoformans/gattii NOT DETECTED NOT DETECTED    Darrick Penna 11/05/2022  11:31 AM

## 2022-11-05 NOTE — Plan of Care (Signed)

## 2022-11-05 NOTE — Assessment & Plan Note (Addendum)
1/ 4 blood culture with strep pneumonia - This is related to cellulitis of the right lower extremity with reactive lymphadenitis of the right groin. - Sepsis manifested by leukocytosis, fever and tachypnea.  Patient received empiric antibiotics with vancomycin and cefepime. - Switch antibiotics with ceftriaxone - Pain management will be provided. - Warm compresses will be applied. - We will follow blood cultures.

## 2022-11-05 NOTE — Progress Notes (Signed)
PHARMACIST - PHYSICIAN ORDER COMMUNICATION  CONCERNING: P&T Medication Policy on Herbal Medications  DESCRIPTION:  This patient's order(s) for: Red Yeast Rice Caps 600 mg has been noted.  This product(s) is classified as an "herbal" or natural product. Due to a lack of definitive safety studies or FDA approval, nonstandard manufacturing practices, plus the potential risk of unknown drug-drug interactions while on inpatient medications, the Pharmacy and Therapeutics Committee does not permit the use of "herbal" or natural products of this type within [facility].   ACTION TAKEN: The pharmacy department is unable to verify this order at this time.  Please reevaluate patient's clinical condition at discharge and address if the herbal or natural product(s) should be resumed at that time.  Renda Rolls, PharmD, [facility] 11/05/2022 3:11 AM

## 2022-11-05 NOTE — Assessment & Plan Note (Addendum)
-   This could be related to his sepsis and partly related to acute on chronic diastolic CHF especially given his elevated BNP - O2 protocol will be followed-currently weaned back to room air - He will be diuresed with IV Lasix.

## 2022-11-05 NOTE — Progress Notes (Signed)
Pharmacy Antibiotic Note  Matthew Howe is a 77 y.o. male admitted on 11/04/2022 with cellulitis.  Pharmacy has been consulted for Cefepime & Vancomycin dosing for 7 days.  Plan: Cefepime 2 gm q12hr per indication & renal fxn.  Pt given Vancomycin 2000 mg once. Vancomycin 1250 mg IV Q 24 hrs. Goal AUC 400-550. Expected AUC: 488.8 SCr used: 2.22  Pharmacy will continue to follow and will adjust abx dosing whenever warranted.  Temp (24hrs), Avg:99.6 F (37.6 C), Min:98.5 F (36.9 C), Max:101.4 F (38.6 C)   Recent Labs  Lab 11/04/22 2103 11/04/22 2236  WBC 15.1*  --   CREATININE 2.22*  --   LATICACIDVEN 1.6 1.2    Estimated Creatinine Clearance: 37.8 mL/min (A) (by C-G formula based on SCr of 2.22 mg/dL (H)).    Allergies  Allergen Reactions   Linaclotide Shortness Of Breath and Other (See Comments)   Codeine Diarrhea, Nausea And Vomiting and Other (See Comments)   Oxycodone Nausea And Vomiting   Terazosin Other (See Comments)    Dizziness / altered mental state  Dizziness / altered mental state  Dizziness / altered mental state    Gabapentin Other (See Comments)    dizziness Other reaction(s): Other (See Comments) dizziness   Hydrocodone-Acetaminophen Diarrhea, Nausea And Vomiting and Other (See Comments)   Lubiprostone Other (See Comments)    Abdominal cramps Other reaction(s): Other (See Comments) Abdominal cramps Abdominal cramps   Norco [Hydrocodone-Acetaminophen] Diarrhea and Nausea And Vomiting   Omeprazole Diarrhea, Nausea And Vomiting and Other (See Comments)    Stomach cramps   Ropinirole Nausea Only and Other (See Comments)    Antimicrobials this admission: 1/26 Ceftriaxone >> x 1 dose 1/26 Azithromycin >> x 1 dose 1/27 Cefepime >> x 7 days 1/27 Vancomycin >> x 7 days  Microbiology results: 1/26 BCx: Pending  Thank you for allowing pharmacy to be a part of this patient's care.  Renda Rolls, PharmD, MBA 11/05/2022 3:01 AM

## 2022-11-05 NOTE — Assessment & Plan Note (Signed)
-  We will continue allopurinol 

## 2022-11-05 NOTE — Assessment & Plan Note (Signed)
-  We will continue Lyrica.

## 2022-11-06 ENCOUNTER — Inpatient Hospital Stay (HOSPITAL_COMMUNITY)
Admit: 2022-11-06 | Discharge: 2022-11-06 | Disposition: A | Discharge status: Home / Self Care | Payer: Medicare Other | Attending: Family Medicine | Admitting: Family Medicine

## 2022-11-06 DIAGNOSIS — Z8739 Personal history of other diseases of the musculoskeletal system and connective tissue: Secondary | ICD-10-CM

## 2022-11-06 DIAGNOSIS — I480 Paroxysmal atrial fibrillation: Secondary | ICD-10-CM

## 2022-11-06 DIAGNOSIS — L0390 Cellulitis, unspecified: Secondary | ICD-10-CM | POA: Diagnosis not present

## 2022-11-06 DIAGNOSIS — I5031 Acute diastolic (congestive) heart failure: Secondary | ICD-10-CM

## 2022-11-06 DIAGNOSIS — I5033 Acute on chronic diastolic (congestive) heart failure: Secondary | ICD-10-CM | POA: Diagnosis not present

## 2022-11-06 DIAGNOSIS — J9601 Acute respiratory failure with hypoxia: Secondary | ICD-10-CM | POA: Diagnosis not present

## 2022-11-06 DIAGNOSIS — I10 Essential (primary) hypertension: Secondary | ICD-10-CM | POA: Diagnosis not present

## 2022-11-06 DIAGNOSIS — N1832 Chronic kidney disease, stage 3b: Secondary | ICD-10-CM

## 2022-11-06 LAB — BASIC METABOLIC PANEL
Anion gap: 3 — ABNORMAL LOW (ref 5–15)
BUN: 43 mg/dL — ABNORMAL HIGH (ref 8–23)
CO2: 25 mmol/L (ref 22–32)
Calcium: 8.2 mg/dL — ABNORMAL LOW (ref 8.9–10.3)
Chloride: 107 mmol/L (ref 98–111)
Creatinine, Ser: 2.11 mg/dL — ABNORMAL HIGH (ref 0.61–1.24)
GFR, Estimated: 32 mL/min — ABNORMAL LOW (ref >60)
Glucose, Bld: 100 mg/dL — ABNORMAL HIGH (ref 70–99)
Potassium: 4.4 mmol/L (ref 3.5–5.1)
Sodium: 135 mmol/L (ref 135–145)

## 2022-11-06 LAB — CBC WITH DIFFERENTIAL/PLATELET
Abs Immature Granulocytes: 0.06 10*3/uL (ref 0.00–0.07)
Basophils Absolute: 0 10*3/uL (ref 0.0–0.1)
Basophils Relative: 0 %
Eosinophils Absolute: 0.1 10*3/uL (ref 0.0–0.5)
Eosinophils Relative: 1 %
HCT: 39 % (ref 39.0–52.0)
Hemoglobin: 12.4 g/dL — ABNORMAL LOW (ref 13.0–17.0)
Immature Granulocytes: 1 %
Lymphocytes Relative: 9 %
Lymphs Abs: 1 10*3/uL (ref 0.7–4.0)
MCH: 30.2 pg (ref 26.0–34.0)
MCHC: 31.8 g/dL (ref 30.0–36.0)
MCV: 95.1 fL (ref 80.0–100.0)
Monocytes Absolute: 1.1 10*3/uL — ABNORMAL HIGH (ref 0.1–1.0)
Monocytes Relative: 9 %
Neutro Abs: 9.2 10*3/uL — ABNORMAL HIGH (ref 1.7–7.7)
Neutrophils Relative %: 80 %
Platelets: 151 10*3/uL (ref 150–400)
RBC: 4.1 MIL/uL — ABNORMAL LOW (ref 4.22–5.81)
RDW: 14.8 % (ref 11.5–15.5)
WBC: 11.6 10*3/uL — ABNORMAL HIGH (ref 4.0–10.5)
nRBC: 0 % (ref 0.0–0.2)

## 2022-11-06 LAB — ECHOCARDIOGRAM COMPLETE
AV Peak grad: 4.6 mmHg
Ao pk vel: 1.07 m/s
Area-P 1/2: 4.97 cm2
S' Lateral: 4 cm
Weight: 4091.74 oz

## 2022-11-06 MED ORDER — SODIUM CHLORIDE 0.9 % IV SOLN
2.0000 g | INTRAVENOUS | Status: DC
  Administered 2022-11-06 – 2022-11-10 (×5): 2 g via INTRAVENOUS
  Filled 2022-11-06 (×3): qty 2
  Filled 2022-11-06: qty 20
  Filled 2022-11-06: qty 2
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
## 2022-11-06 NOTE — Plan of Care (Signed)
  Problem: Clinical Measurements: Goal: Diagnostic test results will improve Outcome: Progressing Goal: Signs and symptoms of infection will decrease Outcome: Progressing   Problem: Activity: Goal: Risk for activity intolerance will decrease Outcome: Progressing   Problem: Coping: Goal: Level of anxiety will decrease Outcome: Progressing   Problem: Elimination: Goal: Will not experience complications related to bowel motility Outcome: Progressing Goal: Will not experience complications related to urinary retention Outcome: Progressing   Problem: Safety: Goal: Ability to remain free from injury will improve Outcome: Progressing   Problem: Skin Integrity: Goal: Risk for impaired skin integrity will decrease Outcome: Progressing
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
## 2022-11-06 NOTE — Assessment & Plan Note (Signed)
Hold elevated BNP at 1216.  No recent echocardiogram in the system, prior echocardiogram from 2018 with normal EF and indeterminate LV diastolic dysfunction. Repeat echocardiogram done-pending results -Continue with IV Lasix -Daily BMP and weight -Strict intake and output

## 2022-11-06 NOTE — Progress Notes (Signed)
  Echocardiogram 2D Echocardiogram has been performed.  Claretta Fraise 11/06/2022, 11:04 AM

## 2022-11-07 ENCOUNTER — Inpatient Hospital Stay (HOSPITAL_COMMUNITY)
Admit: 2022-11-07 | Discharge: 2022-11-07 | Disposition: A | Discharge status: Home / Self Care | Payer: Medicare Other | Attending: Internal Medicine | Admitting: Internal Medicine

## 2022-11-07 DIAGNOSIS — I5021 Acute systolic (congestive) heart failure: Secondary | ICD-10-CM

## 2022-11-07 DIAGNOSIS — R9431 Abnormal electrocardiogram [ECG] [EKG]: Secondary | ICD-10-CM

## 2022-11-07 DIAGNOSIS — I4891 Unspecified atrial fibrillation: Secondary | ICD-10-CM

## 2022-11-07 DIAGNOSIS — J9601 Acute respiratory failure with hypoxia: Secondary | ICD-10-CM | POA: Diagnosis not present

## 2022-11-07 DIAGNOSIS — L0390 Cellulitis, unspecified: Secondary | ICD-10-CM | POA: Diagnosis not present

## 2022-11-07 DIAGNOSIS — R7881 Bacteremia: Secondary | ICD-10-CM | POA: Diagnosis not present

## 2022-11-07 DIAGNOSIS — I10 Essential (primary) hypertension: Secondary | ICD-10-CM | POA: Diagnosis not present

## 2022-11-07 DIAGNOSIS — I5033 Acute on chronic diastolic (congestive) heart failure: Secondary | ICD-10-CM | POA: Diagnosis not present

## 2022-11-07 LAB — BASIC METABOLIC PANEL
Anion gap: 10 (ref 5–15)
BUN: 43 mg/dL — ABNORMAL HIGH (ref 8–23)
CO2: 26 mmol/L (ref 22–32)
Calcium: 8.8 mg/dL — ABNORMAL LOW (ref 8.9–10.3)
Chloride: 104 mmol/L (ref 98–111)
Creatinine, Ser: 1.83 mg/dL — ABNORMAL HIGH (ref 0.61–1.24)
GFR, Estimated: 38 mL/min — ABNORMAL LOW (ref >60)
Glucose, Bld: 104 mg/dL — ABNORMAL HIGH (ref 70–99)
Potassium: 4 mmol/L (ref 3.5–5.1)
Sodium: 140 mmol/L (ref 135–145)

## 2022-11-07 LAB — URINALYSIS, W/ REFLEX TO CULTURE (INFECTION SUSPECTED)
Bilirubin Urine: NEGATIVE
Glucose, UA: NEGATIVE mg/dL
Ketones, ur: NEGATIVE mg/dL
Leukocytes,Ua: NEGATIVE
Nitrite: NEGATIVE
Protein, ur: 30 mg/dL — AB
Specific Gravity, Urine: 1.014 (ref 1.005–1.030)
Squamous Epithelial / HPF: NONE SEEN /HPF (ref 0–5)
pH: 5 (ref 5.0–8.0)

## 2022-11-07 LAB — CBC WITH DIFFERENTIAL/PLATELET
Abs Immature Granulocytes: 0.03 10*3/uL (ref 0.00–0.07)
Basophils Absolute: 0 10*3/uL (ref 0.0–0.1)
Basophils Relative: 0 %
Eosinophils Absolute: 0.2 10*3/uL (ref 0.0–0.5)
Eosinophils Relative: 2 %
HCT: 39.2 % (ref 39.0–52.0)
Hemoglobin: 12.4 g/dL — ABNORMAL LOW (ref 13.0–17.0)
Immature Granulocytes: 0 %
Lymphocytes Relative: 12 %
Lymphs Abs: 1.1 10*3/uL (ref 0.7–4.0)
MCH: 29.7 pg (ref 26.0–34.0)
MCHC: 31.6 g/dL (ref 30.0–36.0)
MCV: 94 fL (ref 80.0–100.0)
Monocytes Absolute: 0.9 10*3/uL (ref 0.1–1.0)
Monocytes Relative: 10 %
Neutro Abs: 6.6 10*3/uL (ref 1.7–7.7)
Neutrophils Relative %: 76 %
Platelets: 185 10*3/uL (ref 150–400)
RBC: 4.17 MIL/uL — ABNORMAL LOW (ref 4.22–5.81)
RDW: 14.5 % (ref 11.5–15.5)
WBC: 8.8 10*3/uL (ref 4.0–10.5)
nRBC: 0 % (ref 0.0–0.2)

## 2022-11-07 LAB — ECHOCARDIOGRAM LIMITED
Area-P 1/2: 5.54 cm2
S' Lateral: 4.3 cm
Weight: 4081.16 oz

## 2022-11-07 LAB — CULTURE, BLOOD (ROUTINE X 2): Special Requests: ADEQUATE

## 2022-11-07 MED ORDER — LOSARTAN POTASSIUM 25 MG PO TABS
25.0000 mg | ORAL_TABLET | Freq: Every day | ORAL | Status: DC
  Administered 2022-11-08: 25 mg via ORAL
  Filled 2022-11-07: qty 1

## 2022-11-07 MED ORDER — PERFLUTREN LIPID MICROSPHERE
1.0000 mL | INTRAVENOUS | Status: AC | PRN
  Administered 2022-11-07: 2 mL via INTRAVENOUS
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
## 2022-11-07 NOTE — Assessment & Plan Note (Signed)
Improved.  Currently on room air. - This could be related to his sepsis and partly related to acute on chronic diastolic CHF especially given his elevated BNP

## 2022-11-07 NOTE — Plan of Care (Signed)
  Problem: Respiratory: Goal: Ability to maintain adequate ventilation will improve Outcome: Progressing   Problem: Activity: Goal: Risk for activity intolerance will decrease Outcome: Progressing   Problem: Elimination: Goal: Will not experience complications related to bowel motility Outcome: Progressing Goal: Will not experience complications related to urinary retention Outcome: Progressing   Problem: Safety: Goal: Ability to remain free from injury will improve Outcome: Progressing   Problem: Skin Integrity: Goal: Risk for impaired skin integrity will decrease Outcome: Progressing

## 2022-11-07 NOTE — TOC Initial Note (Signed)
Transition of Care [facility]) - Initial/Assessment Note    Patient Details  Name: Matthew Howe MRN: [id number] Date of Birth: August 21, 1946  Transition of Care [facility]) CM/SW Contact:    Laurena Slimmer, RN Phone Number: 11/07/2022, 2:26 PM  Clinical Narrative:                  Transition of Care [facility]) Screening Note   Patient Details  Name: Matthew Howe Date of Birth: 05-31-1946   [facility] CM/SW Contact:    Laurena Slimmer, RN Phone Number: 11/07/2022, 2:26 PM    Transition of Care Department [facility]) has reviewed patient and no TOC needs have been identified at this time. We will continue to monitor patient advancement through interdisciplinary progression rounds. If new patient transition needs arise, please place a TOC consult.          Patient Goals and CMS Choice            Expected Discharge Plan and Services                                              Prior Living Arrangements/Services                       Activities of Daily Living Home Assistive Devices/Equipment: None ADL Screening (condition at time of admission) Patient's cognitive ability adequate to safely complete daily activities?: Yes Is the patient deaf or have difficulty hearing?: No Does the patient have difficulty seeing, even when wearing glasses/contacts?: No Does the patient have difficulty concentrating, remembering, or making decisions?: Yes Patient able to express need for assistance with ADLs?: Yes Does the patient have difficulty dressing or bathing?: Yes Independently performs ADLs?: No Does the patient have difficulty walking or climbing stairs?: Yes Weakness of Legs: Both Weakness of Arms/Hands: None  Permission Sought/Granted                  Emotional Assessment              Admission diagnosis:  Cellulitis of right lower extremity [L03.115] Altered mental status, unspecified altered mental status type [R41.82] Sepsis due to  cellulitis ([place]) [L03.90, A41.9] Sepsis with acute hypoxic respiratory failure without septic shock, due to unspecified organism ([place]) [A41.9, R65.20, J96.01] Patient Active Problem List   Diagnosis Date Noted   Sepsis due to cellulitis ([place]) 11/05/2022   GERD without esophagitis 11/05/2022   History of gout 11/05/2022   Peripheral neuropathy 11/05/2022   Stage III chronic kidney disease ([place]) 11/05/2022   Cellulitis of right lower extremity 11/05/2022   Acute respiratory failure with hypoxia ([place]) 11/05/2022   Cardiac pacemaker in situ 04/18/2019   Persistent atrial fibrillation ([place]) 04/18/2019   Spondylolisthesis of lumbar region 08/06/2018   Bilateral carpal tunnel syndrome 07/22/2018   Back pain 07/06/2018   Joint pain 03/29/2018   Morbid obesity ([place]) 09/20/2016   Knee pain, left 06/23/2016   Constipation 03/23/2016   Bradycardia 09/17/2015   Chronic diastolic heart failure ([place]) 08/11/2015   Acute on chronic diastolic CHF (congestive heart failure) ([place])    Paroxysmal atrial fibrillation ([place])    Mitral valve regurgitation 06/01/2015   Fatigue 06/01/2015   Complete heart block ([place]) 11/21/2014   CAD (coronary artery disease) 04/18/2013   Murmur 03/15/2013   Abdominal discomfort 03/15/2013   Parkinson's  disease ([place]) 03/15/2013   Essential hypertension 03/15/2013   Hyperlipidemia 03/15/2013   PCP:  Leonel Ramsay, MD Pharmacy:   [id number] [id number]: [phone]: [phone] [id number] [id number]: [phone]: [phone]   Social Determinants of Health (SDOH) Social History: SDOH Screenings   Food Insecurity: No Food Insecurity (10/26/2017)  Transportation Needs: No Transportation Needs (10/26/2017)  Financial Resource Strain: Low Risk  (10/26/2017)  Physical Activity: Insufficiently  Active (10/26/2017)  Social Connections: Moderately Integrated (10/26/2017)  Stress: No Stress Concern Present (10/26/2017)  Tobacco Use: Low Risk  (08/01/2022)   SDOH Interventions:     Readmission Risk Interventions     No data to display

## 2022-11-07 NOTE — Progress Notes (Signed)
*  PRELIMINARY RESULTS* Echocardiogram 2D Echocardiogram has been performed.  Matthew Howe, Matthew Howe 11/07/2022, 1:46 PM

## 2022-11-07 NOTE — Discharge Instructions (Signed)

## 2022-11-07 NOTE — Assessment & Plan Note (Signed)
Hold elevated BNP at 1216.  No recent echocardiogram in the system, prior echocardiogram from 2018 with normal EF and indeterminate LV diastolic dysfunction. Repeat echocardiogram with incomplete window to comment on EF and diastolic function. Continue to diurese well.  Renal function stable and improving -Cardiology consult -Repeat echocardiogram -Continue with IV Lasix -Daily BMP and weight -Strict intake and output

## 2022-11-07 NOTE — Consult Note (Signed)
   Heart Failure Nurse Navigator Note  HF EF unknown.  Mild concentric LVH.  In 2018 EF was 55 to 60%.  He presented to the emergency room with complaints of increasing shortness of breath, altered mental status, temp of 104 and after sustaining a fall.  BNP 1216.  Chest x-ray revealed moderate cardiomegaly.  Comorbidities:  Coronary artery disease Hypertension Parkinson's Chronic kidney disease stage III GERD Gout Peripheral neuropathy Osteoarthritis Status post permanent pacemaker([place]) Morbid obesity  Medications:  Apixaban and 5 mg 2 times a day Carvedilol 3.125 mg 2 times a day Flecainide 100 mg 2 times a day Furosemide 40 mg every 12 hours IV Lisinopril 2.5 mg daily Metolazone 2.5 daily as needed Same chloride 40 mEq daily  Labs:  Sodium 140, potassium 4, chloride 104, CO2 26, BUN 43, creatinine 1.03, estimated GFR 38, hemoglobin 12.4, hematocrit 39.2. Weight is 115.7 kg Intake 590 mL Output 2800 mL  Initial meeting with patient and his wife who was at the bedside.  Prior  to admission patient was not weighing himself daily.  Went over the importance of daily weights and reporting a 2 pound weight gain overnight or total of 5 pounds within the week.  Went over the importance of first thing in the morning before eating and drinking.  Wife states that he does not add salt at the table she cooks with very little salt.  Also states that once in a while they will get food from restaurants such as [place] fried chicken breast sandwich or pizza.  Made aware that those are higher in sodium, and that she could use her cell phone for looking up sodium content in foods that they were thinking about ordering.  She voices understanding.  He becomes tearful at times.  Also discussed fluid restriction of no more than 64 ounces and what constitutes a liquid.  Patient states "I am always thirsty".  Discussed ways to combat thirst such as hard candy, brushing your teeth  frequently, mouthwash or rinsing your mouth with water and then spitting it out and ice chips, which does add into the fluid restriction.  Discussed follow up in the outpatient heart failure clinic for which he has an appointment on February 2 at 11:30 AM.he was last seen in the [facility] in 2019.  He was given the living with heart failure teaching booklet, zone magnet, info on heart failure and low-sodium along with weight chart.  They had no further questions.  Pricilla Riffle RN CHFN

## 2022-11-07 NOTE — Assessment & Plan Note (Signed)
Renal functions consistent with CKD stage IIIb, continue to improve with diuresis - Continue to monitor

## 2022-11-07 NOTE — Plan of Care (Signed)
  Problem: Elimination: Goal: Will not experience complications related to bowel motility Outcome: Progressing   Problem: Nutrition: Goal: Adequate nutrition will be maintained Outcome: Progressing   Problem: Activity: Goal: Risk for activity intolerance will decrease Outcome: Progressing   Problem: Pain Managment: Goal: General experience of comfort will improve Outcome: Progressing   Problem: Safety: Goal: Ability to remain free from injury will improve Outcome: Progressing   Problem: Skin Integrity: Goal: Risk for impaired skin integrity will decrease Outcome: Progressing

## 2022-11-07 NOTE — Consult Note (Addendum)
Cardiology Consultation:   Patient ID: Matthew Howe; [id number]; 08-06-1946   Admit date: 11/04/2022 Date of Consult: 11/07/2022  Primary Care Provider: Leonel Ramsay, MD Primary Cardiologist: Rockey Situ Primary Electrophysiologist:  Caryl Comes   Patient Profile:   Matthew Howe is a 77 y.o. male with a hx of CAD medically managed, persistent A-fib status post DCCV in 11/2016 and again in 02/2019, complete heart block status post PPM implantation in 11/2014, HFpEF, Parkinson's disease, HTN, and HLD who is being seen today for the evaluation of Streptococcus pneumonia bacteremia and cardiomyopathy at the request of Dr. Reesa Chew.  History of Present Illness:   [name] underwent LHC in 2014 showed 25% proximal LAD stenosis followed by 40% stenosis.  Cath report makes mention of atrial flutter, though no mention of arrhythmia and clinic notes at that time.  He has not required ischemic evaluation since.  He was diagnosed with A-fib on the pacemaker interrogation in 11/2014 and started on anticoagulation.  He underwent TEE guided DCCV in 11/2016, though had recurrent A-fib with successful repeat DCCV on 03/08/2019.  Echo from 03/2019 showed an EF of 50 to 55%, normal RV systolic function, severely dilated left atrium, and dilatation of the aortic root and ascending aorta.  Rhythm of A-fib leading him to be initiated on flecainide.   He was admitted to [facility] on 11/05/2022 with fall and acute generalized weakness without LOC.  He was found to have strep pneumo bacteremia on blood cultures.  BNP 1216.  Chest x-ray showed mild cardiomegaly with mild central pulmonary vascular congestion as well as right basilar atelectasis versus infiltrate.  CT of the chest showed no evidence of pneumonia or pulmonary edema.  There was also notation of moderate cardiomegaly with mildly prominent central pulmonary veins and prominent pulmonary trunk suggestive of arterial hypertension, aortic atherosclerosis, and slight dilatation of  the aortic root and ascending segment.  CT of the head showed no acute intracranial abnormality.  CT renal stone protocol showed no acute abnormality in the abdomen or pelvis.  Echo on 11/07/2022 was a technically difficult study due to poor echo windows with challenging image acquisition due to patient body habitus and unable to complete the study due to patient request.  Initial EF was unable to be assessed.  Repeat echo completed on 11/07/2022 showed an EF of 40 to 45%, unable to evaluate for regional wall motion abnormalities, moderate LVH, indeterminate LV diastolic function parameters, mild biatrial enlargement, and no significant valvular abnormalities.  Cardiology consulted for evaluation of cardiomyopathy and TEE in the context of strep pneumo bacteremia in a patient with permanent pacemaker.  Currently without symptoms of angina, dyspnea, palpitations, dizziness, presyncope, or syncope.  Lower extremity swelling improved.  No abdominal distention or orthopnea.   Past Medical History:  Diagnosis Date   Arthritis    Atrial fibrillation [facility])    Atrial fibrillation (HCC)    Carpal tunnel syndrome on both sides    CHF (congestive heart failure) ([place])    CKD (chronic kidney disease) 10/2018   recent diagnosis   Complete heart block ([place])    a. s/p St. Jude PPM 11/2014.   Complication of anesthesia    stayed overnight dt under too long d/t taking Parkinson's medication (2013?)   Coronary artery disease    GERD (gastroesophageal reflux disease)    "not as bad as it used to be"   Heart murmur    History of recent blood transfusion 07/2018   Hyperlipidemia    Hypertension  IBS (irritable bowel syndrome)    Lower extremity edema    Parkinson's disease    Presence of permanent cardiac pacemaker    St Jude   Shortness of breath dyspnea     Past Surgical History:  Procedure Laterality Date   APPENDECTOMY  12/2010   BACK SURGERY  08/06/2018   lower lumbarsacral region, has rods in place.  (done at [facility])   [place]  11/20/2012   Known CAD with one vessel coronary disease of left descending artery by cardiac cath.    CARDIOVERSION N/A 11/21/2016   Procedure: CARDIOVERSION;  Surgeon: Deboraha Sprang, MD;  Location: [facility];  Service: Cardiovascular;  Laterality: N/A;   CARDIOVERSION N/A 03/08/2019   Procedure: CARDIOVERSION;  Surgeon: Fay Records, MD;  Location: [facility] ENDOSCOPY;  Service: Cardiovascular;  Laterality: N/A;   CARPAL TUNNEL RELEASE Right 10/22/2018   Procedure: CARPAL TUNNEL RELEASE;  Surgeon: Dereck Leep, MD;  Location: [facility];  Service: Orthopedics;  Laterality: Right;   CARPAL TUNNEL RELEASE Left 05/22/2019   Procedure: CARPAL TUNNEL RELEASE - LEFT;  Surgeon: Dereck Leep, MD;  Location: [facility];  Service: Orthopedics;  Laterality: Left;   COLONOSCOPY  2018   COLONOSCOPY N/A 10/21/2020   Procedure: COLONOSCOPY;  Surgeon: Robert Bellow, MD;  Location[facility] ENDOSCOPY;  Service: Endoscopy;  Laterality: N/A;   HERNIA REPAIR  29/52/8413   umbilical hernia repair   INSERT / REPLACE / REMOVE PACEMAKER     PACEMAKER INSERTION  11-21-14   STJ Assurity dual chamber pacemaker implanted by Dr Rayann Heman for CHB   PERMANENT PACEMAKER INSERTION N/A 11/21/2014   Procedure: PERMANENT PACEMAKER INSERTION;  Surgeon: Thompson Grayer, MD;  Location: [facility] CATH LAB;  Service: Cardiovascular;  Laterality: N/A;   TEE WITHOUT CARDIOVERSION N/A 11/21/2016   Procedure: TRANSESOPHAGEAL ECHOCARDIOGRAM (TEE);  Surgeon: Deboraha Sprang, MD;  Location: [facility] ORS;  Service: Cardiovascular;  Laterality: N/A;   UPPER GI ENDOSCOPY       Home Meds: Prior to Admission medications   Medication Sig Start Date End Date Taking? Authorizing Provider  acetaminophen (TYLENOL) 500 MG tablet Take 500 mg by mouth every 6 (six) hours as needed (back pain.).    Yes [provider]  allopurinol (ZYLOPRIM) 100 MG tablet Take 100 mg by mouth daily.   Yes [provider]   allopurinol (ZYLOPRIM) 300 MG tablet Take 300 mg by mouth daily. 07/09/18  Yes [provider]  apixaban (ELIQUIS) 5 MG TABS tablet Take 1 tablet (5 mg total) by mouth 2 (two) times daily. 08/12/22  Yes Gollan, Kathlene November, MD  bisacodyl (DULCOLAX) 5 MG EC tablet Take 5 mg by mouth 2 (two) times daily.   Yes [provider]  carboxymethylcellulose (REFRESH PLUS) 0.5 % SOLN Place 1 drop into both eyes at bedtime.   Yes [provider]  carvedilol (COREG) 3.125 MG tablet Take 3.125 mg by mouth 2 (two) times daily.   Yes [provider]  desloratadine (CLARINEX) 5 MG tablet Take 5 mg by mouth at bedtime.    Yes [provider]  flecainide (TAMBOCOR) 100 MG tablet TAKE 1 TABLET BY MOUTH TWICE A DAY 09/05/22  Yes Gollan, Kathlene November, MD  fluticasone (FLONASE) 50 MCG/ACT nasal spray Place 2 sprays into the nose daily as needed for allergies.    Yes [provider]  lansoprazole (PREVACID) 15 MG capsule Take 15 mg by mouth daily as needed (indigestion).    Yes [provider]  lisinopril (ZESTRIL) 2.5 MG tablet Take 2.5 mg by mouth daily. 06/25/22  Yes [provider]  magnesium oxide (MAG-OX) 400 MG tablet Take 400 mg by mouth at bedtime.   Yes [provider]  metolazone (ZAROXOLYN) 2.5 MG tablet TAKE 1 TABLET BY MOUTH DAILY AS NEEDED (ONCE DAILY AS NEEDED FOR WEIGHT GAIN OR SWELLING.). 06/22/21  Yes Gollan, Kathlene November, MD  Multiple Vitamins-Minerals (OCUVITE ADULT 50+ PO) Take 1 tablet by mouth daily.   Yes [provider]  oxybutynin (DITROPAN-XL) 5 MG 24 hr tablet Take 5 mg by mouth at bedtime. 09/28/22 09/28/23 Yes [provider]  polyethylene glycol (MIRALAX / GLYCOLAX) packet Take 17 g by mouth daily as needed (constipation.).    Yes [provider]  potassium chloride (KLOR-CON) 10 MEQ tablet TAKE 4 TABLETS (40 MEQ TOTAL) BY MOUTH IN THE MORNING. 10/11/22  Yes Gollan, Kathlene November, MD  pramipexole  (MIRAPEX) 1 MG tablet Take 1 mg by mouth 4 (four) times daily. 04/09/21  Yes Anabel Bene, MD  pregabalin (LYRICA) 25 MG capsule Take 25 mg by mouth every other day. 09/29/22  Yes [provider]  Red Yeast Rice 600 MG CAPS Take 600 mg by mouth daily.   Yes [provider]  selegiline (ELDEPRYL) 5 MG capsule Take 5 mg by mouth 2 (two) times daily with a meal.    Yes [provider]  SENEXON-S 8.6-50 MG tablet Take 2 tablets by mouth 2 (two) times daily. 09/15/19  Yes [provider]  torsemide (DEMADEX) 20 MG tablet TAKE 4 TABLETS (80 MG TOTAL) BY MOUTH EVERY MORNING. 10/04/22  Yes Gollan, Kathlene November, MD  meloxicam (MOBIC) 15 MG tablet Take 15 mg by mouth 2 (two) times a week. Mondays & Thursdays Patient not taking: Reported on 11/06/2022    [provider]    Inpatient Medications: Scheduled Meds:  allopurinol  100 mg Oral Daily   apixaban  5 mg Oral BID   bisacodyl  5 mg Oral BID   carvedilol  3.125 mg Oral BID WC   flecainide  100 mg Oral BID   furosemide  40 mg Intravenous Q12H   lisinopril  2.5 mg Oral Daily   magnesium oxide  400 mg Oral QHS   multivitamin with minerals  1 tablet Oral Daily   pantoprazole  40 mg Oral Daily   potassium chloride SA  40 mEq Oral Daily   pramipexole  1 mg Oral QID   selegiline  5 mg Oral BID WC   senna-docusate  2 tablet Oral BID   Continuous Infusions:  cefTRIAXone (ROCEPHIN)  IV 2 g (11/07/22 1047)   PRN Meds: acetaminophen **OR** acetaminophen, magnesium hydroxide, metolazone, morphine injection, ondansetron **OR** ondansetron (ZOFRAN) IV, polyethylene glycol, traZODone  Allergies:   Allergies  Allergen Reactions   Linaclotide Shortness Of Breath and Other (See Comments)   Codeine Diarrhea, Nausea And Vomiting and Other (See Comments)   Oxycodone Nausea And Vomiting   Terazosin Other (See Comments)    Dizziness / altered mental state  Dizziness / altered mental state  Dizziness / altered  mental state    Gabapentin Other (See Comments)    dizziness Other reaction(s): Other (See Comments) dizziness   Hydrocodone-Acetaminophen Diarrhea, Nausea And Vomiting and Other (See Comments)   Lubiprostone Other (See Comments)    Abdominal cramps Other reaction(s): Other (See Comments) Abdominal cramps Abdominal cramps   Norco [Hydrocodone-Acetaminophen] Diarrhea and Nausea And Vomiting   Omeprazole Diarrhea, Nausea And Vomiting  and Other (See Comments)    Stomach cramps   Ropinirole Nausea Only and Other (See Comments)    Social History:   Social History   Socioeconomic History   Marital status: Married    Spouse name: Electrical engineer   Number of children: 1   Years of education: 14   Highest education level: Associate degree: academic program  Occupational History   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  Tobacco Use   Smoking status: Never   Smokeless tobacco: Never  Vaping Use   Vaping Use: Never used  Substance and Sexual Activity   Alcohol use: No    Alcohol/week: 0.0 standard drinks of alcohol   Drug use: No   Sexual activity: Never  Other Topics Concern   Not on file  Social History Narrative   Not on file   Social Determinants of Health   Financial Resource Strain: Low Risk  (10/26/2017)   Overall Financial Resource Strain (CARDIA)    Difficulty of Paying Living Expenses: Not hard at all  Food Insecurity: No Food Insecurity (10/26/2017)   Hunger Vital Sign    Worried About Running Out of Food in the Last Year: Never true    [place] in the Last Year: Never true  Transportation Needs: No Transportation Needs (10/26/2017)   PRAPARE - Hydrologist (Medical): No    Lack of Transportation (Non-Medical): No  Physical Activity: Insufficiently Active (10/26/2017)   Exercise Vital Sign    Days of Exercise per Week: 7 days    Minutes of Exercise per Session: 20 min  Stress: No Stress Concern Present (10/26/2017)   [place]    Feeling of Stress : Only a little  Social Connections: Moderately Integrated (10/26/2017)   Social Connection and Isolation Panel [NHANES]    Frequency of Communication with Friends and Family: More than three times a week    Frequency of Social Gatherings with Friends and Family: Never    Attends Religious Services: More than 4 times per year    Active Member of [employer] or Organizations: No    Attends Archivist Meetings: Never    Marital Status: Married  Human resources officer Violence: Not At Risk (10/26/2017)   Humiliation, Afraid, Rape, and Kick questionnaire    Fear of Current or Ex-Partner: No    Emotionally Abused: No    Physically Abused: No    Sexually Abused: No     Family History:   Family History  Problem Relation Age of Onset   Heart disease Mother    Heart disease Father    Colon cancer Father    Stroke Sister     ROS:  Review of Systems  Constitutional:  Positive for malaise/fatigue. Negative for chills, diaphoresis, fever and weight loss.  HENT:  Negative for congestion.   Eyes:  Negative for discharge and redness.  Respiratory:  Negative for cough, sputum production, shortness of breath and wheezing.   Cardiovascular:  Positive for leg swelling. Negative for chest pain, palpitations, orthopnea, claudication and PND.  Gastrointestinal:  Negative for abdominal pain, heartburn, nausea and vomiting.  Musculoskeletal:  Positive for falls. Negative for myalgias.  Skin:  Negative for rash.  Neurological:  Positive for weakness. Negative for dizziness, tingling, tremors, sensory change, speech change, focal weakness and loss of consciousness.  Endo/Heme/Allergies:  Does not bruise/bleed easily.  Psychiatric/Behavioral:  Negative for substance abuse. The patient  is not nervous/anxious.   All other systems reviewed and are negative.     Physical Exam/Data:   Vitals:   11/06/22 2209 11/07/22  0500 11/07/22 0725 11/07/22 0947  BP: 119/63  120/66 114/64  Pulse: 70  70 70  Resp: '16  15 16  '$ Temp: 98.2 F (36.8 C)  98.7 F (37.1 C) 98.6 F (37 C)  TempSrc: Oral  Oral Oral  SpO2: 97%  97% 98%  Weight:  115.7 kg      Intake/Output Summary (Last 24 hours) at 11/07/2022 1511 Last data filed at 11/07/2022 1357 Gross per 24 hour  Intake --  Output 3250 ml  Net -3250 ml   Filed Weights   11/04/22 2054 11/06/22 0500 11/07/22 0500  Weight: 116.1 kg 116 kg 115.7 kg   Body mass index is 34.59 kg/m.   Physical Exam: General: Well developed, well nourished, in no acute distress. Head: Normocephalic, atraumatic, sclera non-icteric, no xanthomas, nares without discharge.  Neck: Negative for carotid bruits. JVD not elevated. Lungs: Mildly diminished along the bases bilaterally. Breathing is unlabored. Heart: RRR with S1 S2. No murmurs, rubs, or gallops appreciated. Abdomen: Soft, non-tender, non-distended with normoactive bowel sounds. No hepatomegaly. No rebound/guarding. No obvious abdominal masses. Msk:  Strength and tone appear normal for age. Extremities: No clubbing or cyanosis.  Trivial bilateral pretibial edema with mild erythema involving the right lower extremity. Distal pedal pulses are 2+ and equal bilaterally. Neuro: Alert and oriented X 3. No facial asymmetry. No focal deficit. Moves all extremities spontaneously. Psych:  Responds to questions appropriately with a normal affect.   EKG:  The EKG was personally reviewed and demonstrates: V-paced, 70 bpm Telemetry:  Telemetry was personally reviewed and demonstrates: V-paced rhythm, 70 bpm  Weights: [employer]   11/04/22 2054 11/06/22 0500 11/07/22 0500  Weight: 116.1 kg 116 kg 115.7 kg    Relevant CV Studies: As above  Laboratory Data:  Chemistry Recent Labs  Lab 11/05/22 0506 11/06/22 0627 11/07/22 0244  NA 140 135 140  K 4.5 4.4 4.0  CL 105 107 104  CO2 '25 25 26  '$ GLUCOSE 133* 100* 104*  BUN 44*  43* 43*  CREATININE 2.19* 2.11* 1.83*  CALCIUM 8.6* 8.2* 8.8*  GFRNONAA 31* 32* 38*  ANIONGAP 10 3* 10    Recent Labs  Lab 11/04/22 2103  PROT 7.0  ALBUMIN 4.0  AST 30  ALT 18  ALKPHOS 66  BILITOT 1.5*   Hematology Recent Labs  Lab 11/05/22 0506 11/06/22 0627 11/07/22 0244  WBC 15.9* 11.6* 8.8  RBC 4.22 4.10* 4.17*  HGB 13.0 12.4* 12.4*  HCT 40.4 39.0 39.2  MCV 95.7 95.1 94.0  MCH 30.8 30.2 29.7  MCHC 32.2 31.8 31.6  RDW 14.9 14.8 14.5  PLT 166 151 185   Cardiac EnzymesNo results for input(s): "TROPONINI" in the last 168 hours. No results for input(s): "TROPIPOC" in the last 168 hours.  BNP Recent Labs  Lab 11/04/22 2103  BNP 1,216.0*    DDimer No results for input(s): "DDIMER" in the last 168 hours.  Radiology/Studies:   CT CHEST WO CONTRAST  Result Date: 11/04/2022 IMPRESSION: 1. No evidence of pneumonia or pulmonary edema. 2. Moderate cardiomegaly with left chamber predominance, mildly prominent central pulmonary veins, and a prominent pulmonary trunk indicating arterial hypertension. 3. Aortic atherosclerosis with slight dilatation of the aortic root and ascending segment. Follow-up CTA or MRA recommended in 12 months. This recommendation follows 2010 ACCF/AHA/AATS/ACR/ASA/SCA/SCAI/SIR/STS/SVM Guidelines for the Diagnosis and Management  of Patients with Thoracic Aortic Disease. Circulation. 2010; 121: H062-B762. Aortic aneurysm NOS (ICD10-I71.9). 4. Chronic subpleural reticulation in the lung bases. 5. Extensive multilevel thoracic spine bridging enthesopathy of DISH. Aortic Atherosclerosis (ICD10-I70.0). Electronically Signed   By: Telford Nab M.D.   On: 11/04/2022 23:33   CT Renal Stone Study  Result Date: 11/04/2022 IMPRESSION: No acute abnormality in the abdomen or pelvis. Mild nonspecific subcutaneous stranding about the anterior right thigh is nonspecific and could be due to edema, contusion or infection. Borderline enlarged right inguinal lymph nodes,  favored reactive. Electronically Signed   By: Placido Sou M.D.   On: 11/04/2022 23:22   CT HEAD WO CONTRAST (5MM)  Result Date: 11/04/2022 IMPRESSION: Motion degraded images. No evidence of acute intracranial abnormality. Electronically Signed   By: Julian Hy M.D.   On: 11/04/2022 23:13   DG Chest Port 1 View  Result Date: 11/04/2022 IMPRESSION: Mild cardiomegaly with mild central pulmonary vascular congestion. Right basilar atelectasis or infiltrate is noted. Followup PA and lateral chest X-ray is recommended in 3-4 weeks following trial of antibiotic therapy to ensure resolution and exclude underlying malignancy. Electronically Signed   By: Marijo Conception M.D.   On: 11/04/2022 21:32    Assessment and Plan:   Sepsis due to cellulitis and strep pneumo bacteremia:  -Patient will need TEE for further evaluation of bacterial endocarditis as well as for further imaging of pacemaker leads -Due to echo tech schedule, we will pursue TEE on 1/31 at 12:30 with Dr. Garen Lah -NPO at midnight on 11/09/2022 -May need EP input as well given PPM   Acute HFrEF:  -Uncertain chronicity and etiology, BNP elevated at 1216  -IV Lasix with close monitoring of renal function  -Continue PTA carvedilol and lisinopril with consideration for transitioning lisinopril to losartan for 36-hour washout followed by initiation of Entresto if BP allows  -With acute on CKD stage III, defer addition of MRA or SGLT2 inhibitor at this time -Consider ischemic evaluation once improved from acute bacterial illness   Persistent A-fib:  -Currently V-paced  -Carvedilol  -With his new cardiomyopathy, antiarrhythmic therapy may need to be revisited as flecainide may not be the ideal medication for him, will defer to EP  -CHA2DS2-VASc at least 5 (CHF, HTN, age x 2, vascular disease)  -Currently on PTA apixaban 5 mg twice daily (does not meet reduced dosing criteria)   Acute on CKD stage III:  -Renal function  improving   Shared Decision Making/Informed Consent{  The risks [esophageal damage, perforation (1:10,000 risk), bleeding, pharyngeal hematoma as well as other potential complications associated with conscious sedation including aspiration, arrhythmia, respiratory failure and death], benefits (treatment guidance and diagnostic support) and alternatives of a transesophageal echocardiogram were discussed in detail with Mr. Lutze and he is willing to proceed.     For questions or updates, please contact [place] Please consult [email or web address] for contact info under Cardiology/STEMI.   Signed, Christell Faith, PA-C [facility] Pager: [phone] 11/07/2022, 3:11 PM

## 2022-11-07 NOTE — Assessment & Plan Note (Signed)
1/ 4 blood culture with strep pneumonia, - Most likely related to cellulitis of the right lower extremity with reactive lymphadenitis of the right groin. - Sepsis manifested by leukocytosis, fever and tachypnea.  Patient received empiric antibiotics with vancomycin and cefepime, later switched to ceftriaxone. Incomplete echocardiogram but did not show any valvular vegetations.  Patient also has pacemaker in place. -Infectious disease consult -Consult to cardiology for TEE-going tomorrow -Repeat blood cultures-remain negative so far - Continue with ceftriaxone - Continue with supportive care and pain management.

## 2022-11-08 DIAGNOSIS — R7881 Bacteremia: Secondary | ICD-10-CM

## 2022-11-08 DIAGNOSIS — L0390 Cellulitis, unspecified: Secondary | ICD-10-CM | POA: Diagnosis not present

## 2022-11-08 DIAGNOSIS — I5033 Acute on chronic diastolic (congestive) heart failure: Secondary | ICD-10-CM | POA: Diagnosis not present

## 2022-11-08 DIAGNOSIS — I4819 Other persistent atrial fibrillation: Secondary | ICD-10-CM | POA: Diagnosis not present

## 2022-11-08 DIAGNOSIS — J9601 Acute respiratory failure with hypoxia: Secondary | ICD-10-CM | POA: Diagnosis not present

## 2022-11-08 DIAGNOSIS — I10 Essential (primary) hypertension: Secondary | ICD-10-CM | POA: Diagnosis not present

## 2022-11-08 DIAGNOSIS — I429 Cardiomyopathy, unspecified: Secondary | ICD-10-CM | POA: Diagnosis not present

## 2022-11-08 DIAGNOSIS — B953 Streptococcus pneumoniae as the cause of diseases classified elsewhere: Secondary | ICD-10-CM | POA: Diagnosis not present

## 2022-11-08 DIAGNOSIS — L03115 Cellulitis of right lower limb: Secondary | ICD-10-CM | POA: Diagnosis not present

## 2022-11-08 DIAGNOSIS — N1832 Chronic kidney disease, stage 3b: Secondary | ICD-10-CM | POA: Diagnosis not present

## 2022-11-08 LAB — BASIC METABOLIC PANEL
Anion gap: 11 (ref 5–15)
BUN: 44 mg/dL — ABNORMAL HIGH (ref 8–23)
CO2: 29 mmol/L (ref 22–32)
Calcium: 9.4 mg/dL (ref 8.9–10.3)
Chloride: 100 mmol/L (ref 98–111)
Creatinine, Ser: 1.61 mg/dL — ABNORMAL HIGH (ref 0.61–1.24)
GFR, Estimated: 44 mL/min — ABNORMAL LOW (ref >60)
Glucose, Bld: 126 mg/dL — ABNORMAL HIGH (ref 70–99)
Potassium: 3.8 mmol/L (ref 3.5–5.1)
Sodium: 140 mmol/L (ref 135–145)

## 2022-11-08 MED ORDER — FUROSEMIDE 40 MG PO TABS
40.0000 mg | ORAL_TABLET | Freq: Two times a day (BID) | ORAL | Status: DC
  Administered 2022-11-08: 40 mg via ORAL
  Filled 2022-11-08: qty 1
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
## 2022-11-08 NOTE — Plan of Care (Signed)
  Problem: Respiratory: Goal: Ability to maintain adequate ventilation will improve 11/08/2022 1303 by Faraaz Wolin Bet, LPN Outcome: Progressing 11/08/2022 1111 by Gustavus Haskin Bet, LPN Outcome: Progressing   Problem: Education: Goal: Knowledge of General Education information will improve Description: Including pain rating scale, medication(s)/side effects and non-pharmacologic comfort measures 11/08/2022 1303 by Eleasha Cataldo Bet, LPN Outcome: Progressing 11/08/2022 1111 by Irlanda Croghan Bet, LPN Outcome: Progressing   Problem: Activity: Goal: Risk for activity intolerance will decrease 11/08/2022 1303 by Blakley Michna Bet, LPN Outcome: Progressing 11/08/2022 1111 by Olena Willy Bet, LPN Outcome: Progressing   Problem: Pain Managment: Goal: General experience of comfort will improve 11/08/2022 1303 by Shanikka Wonders Bet, LPN Outcome: Progressing 11/08/2022 1111 by Loralie Malta Bet, LPN Outcome: Progressing   Problem: Safety: Goal: Ability to remain free from injury will improve 11/08/2022 1303 by Chihiro Frey Bet, LPN Outcome: Progressing 11/08/2022 1111 by Kathrynne Kulinski Bet, LPN Outcome: Progressing

## 2022-11-08 NOTE — Evaluation (Signed)
Physical Therapy Evaluation Patient Details Name: Matthew Howe MRN: [id number] DOB: 05-09-1946 Today's Date: 11/08/2022  History of Present Illness  Pt is a 77 y.o. caucasian male with medical history significant for osteoarthritis, atrial fibrillation, CHF, CKD, complete heart block status post PPM, GERD, hypertension, dyslipidemia, Parkinson's disease and coronary artery disease, who presented to the emergency room with acute onset of generalized weakness and fall. MD assessment includes: Sepsis due to cellulitis of the RLE, acute respiratory failure with hypoxia, and acute on chronic diastolic CHF.   Clinical Impression  Pt was pleasant and motivated to participate during the session and put forth good effort throughout.  Pt required physical assistance with all functional tasks per below along with cuing for proper sequencing.  Pt was only able to take several very small, effortful, shuffling steps at the EOB with trunk flexed and heavy BUE support on the walker.  Pt is at a very high risk for falls and would not be safe to return to his prior living situation at this time.  Pt will benefit from PT services in a SNF setting upon discharge to safely address deficits listed in patient problem list for decreased caregiver assistance and eventual return to PLOF.         Recommendations for follow up therapy are one component of a multi-disciplinary discharge planning process, led by the attending physician.  Recommendations may be updated based on patient status, additional functional criteria and insurance authorization.  Follow Up Recommendations Skilled nursing-short term rehab (<3 hours/day) Can patient physically be transported by private vehicle: No    Assistance Recommended at Discharge Frequent or constant Supervision/Assistance  Patient can return home with the following  A lot of help with walking and/or transfers;A lot of help with bathing/dressing/bathroom;Assistance with  cooking/housework;Direct supervision/assist for medications management;Assist for transportation;Help with stairs or ramp for entrance    Equipment Recommendations None recommended by PT  Recommendations for Other Services       Functional Status Assessment Patient has had a recent decline in their functional status and/or demonstrates limited ability to make significant improvements in function in a reasonable and predictable amount of time     Precautions / Restrictions Precautions Precautions: Fall Restrictions Weight Bearing Restrictions: No      Mobility  Bed Mobility Overal bed mobility: Needs Assistance Bed Mobility: Supine to Sit, Sit to Supine, Rolling Rolling: Mod assist   Supine to sit: Mod assist Sit to supine: Min assist   General bed mobility comments: Cues for sequencing for BUE assist with rails and physical assist for BLE and trunk control    Transfers Overall transfer level: Needs assistance Equipment used: Rolling walker (2 wheels) Transfers: Sit to/from Stand Sit to Stand: Min assist, From elevated surface           General transfer comment: Min verbal cues for hand placement and increased trunk flexion    Ambulation/Gait Ambulation/Gait assistance: Min assist Gait Distance (Feet): 2 Feet Assistive device: Rolling walker (2 wheels) Gait Pattern/deviations: Trunk flexed, Decreased step length - right, Decreased step length - left, Shuffle Gait velocity: decreased     General Gait Details: Pt able to take several very small, effortful, shuffling steps at the EOB with min A to guide the RW  Stairs            Wheelchair Mobility    Modified Rankin (Stroke Patients Only)       Balance Overall balance assessment: Needs assistance, History of Falls   Sitting balance-Leahy  Scale: Fair Sitting balance - Comments: Min posterior lean that improved with anterior weight shifting Postural control: Posterior lean Standing balance support:  Bilateral upper extremity supported, During functional activity Standing balance-Leahy Scale: Poor                               Pertinent Vitals/Pain Pain Assessment Pain Assessment: No/denies pain    Home Living Family/patient expects to be discharged to:: Private residence Living Arrangements: Spouse/significant other Available Help at Discharge: Family;Available 24 hours/day Type of Home: House Home Access: Stairs to enter Entrance Stairs-Rails: None Entrance Stairs-Number of Steps: 3 Alternate Level Stairs-Number of Steps: 16 Home Layout: Two level;Able to live on main level with bedroom/bathroom Home Equipment: Cane - single point;Rolling Walker (2 wheels);Shower seat - built in;Grab bars - tub/shower      Prior Function Prior Level of Function : Independent/Modified Independent;History of Falls (last six months)             Mobility Comments: Mod Ind amb with a RW mostly household distances, no other falls other than fall associated with admission ADLs Comments: Ind with ADLs     Hand Dominance   Dominant Hand: Right    Extremity/Trunk Assessment   Upper Extremity Assessment Upper Extremity Assessment: Generalized weakness;RUE deficits/detail RUE Deficits / Details: Lateral upper arm pain just distal to the shoulder with shoulder elevation, nursing in room and aware, no pain at rest or with WB through the walker RUE: Unable to fully assess due to pain    Lower Extremity Assessment Lower Extremity Assessment: Generalized weakness       Communication   Communication: No difficulties  Cognition Arousal/Alertness: Awake/alert Behavior During Therapy: WFL for tasks assessed/performed Overall Cognitive Status: Within Functional Limits for tasks assessed                                          General Comments      Exercises Other Exercises Other Exercises: Log roll training for decreased caregiver assistance with bed  mobility tasks Other Exercises: Anterior weight shifting in sitting to address posterior instability   Assessment/Plan    PT Assessment Patient needs continued PT services  PT Problem List Decreased strength;Decreased activity tolerance;Decreased balance;Decreased mobility;Decreased knowledge of use of DME       PT Treatment Interventions DME instruction;Gait training;Stair training;Functional mobility training;Therapeutic activities;Therapeutic exercise;Balance training;Patient/family education    PT Goals (Current goals can be found in the Care Plan section)  Acute Rehab PT Goals Patient Stated Goal: To walk further PT Goal Formulation: With patient Time For Goal Achievement: 11/21/22 Potential to Achieve Goals: Good    Frequency Min 2X/week     Co-evaluation               AM-PAC PT "6 Clicks" Mobility  Outcome Measure Help needed turning from your back to your side while in a flat bed without using bedrails?: A Lot Help needed moving from lying on your back to sitting on the side of a flat bed without using bedrails?: A Lot Help needed moving to and from a bed to a chair (including a wheelchair)?: A Lot Help needed standing up from a chair using your arms (e.g., wheelchair or bedside chair)?: A Lot Help needed to walk in hospital room?: Total Help needed climbing 3-5 steps with a railing? : Total  6 Click Score: 10    End of Session Equipment Utilized During Treatment: Gait belt Activity Tolerance: Patient tolerated treatment well Patient left: in bed;with call bell/phone within reach;with bed alarm set;with family/visitor present Nurse Communication: Mobility status PT Visit Diagnosis: Unsteadiness on feet (R26.81);History of falling (Z91.81);Difficulty in walking, not elsewhere classified (R26.2);Muscle weakness (generalized) (M62.81)    Time: 7048-8891 PT Time Calculation (min) (ACUTE ONLY): 28 min   Charges:   PT Evaluation $PT Eval Moderate Complexity: 1  Mod PT Treatments $Therapeutic Activity: 8-22 mins        D. Royetta Asal PT, DPT 11/08/22, 5:28 PM

## 2022-11-08 NOTE — Progress Notes (Signed)
   Heart Failure Nurse Navigator Note  Stopped by patient's room this morning, patient was asleep but spoke with patient's wife who was at the bedside.  She states that she had a long talk with her husband yesterday about changes that they need to make such as sticking with the 64 ounce fluid restriction, reading labels and eating things that are lower in sodium.  Plans on filling a container with 64 ounces and then every time he would drink a certain amount that would be removed from the bottle.  And if he felt that he had to eat some crackers that they would stick with the 1 serving allotment and pay attention to the sodium content.  She had no further questions at this time.  Pricilla Riffle RN [facility]

## 2022-11-08 NOTE — Consult Note (Incomplete)
Electrophysiology consultation   Patient ID: Matthew Howe MRN: [id number]; DOB: 05-11-1946  Admit date: 11/04/2022 Date of Consult: 11/08/2022  PCP:  Leonel Ramsay, MD   [place] Prov[facility] Cardiologist:  Ida Rogue, MD  Cardiology APP:  Alisa Graff, [place]  Electrophysiologist:  Virl Axe, MD  {   Patient Profile:   Matthew Howe is a 77 y.o. male with a hx of hypertension, GERD, gout, complete heart block post permanent pacemaker implant and atrial fibrillation who is being seen 11/08/2022 for the evaluation of bacteremia and possible CIED associated infection at the request of Matthew Howe.  History of Present Illness:   [name] presented to the hospital November 05, 2022 with malaise and fevers.  Admission blood cultures were positive for strep pneumo.  Infectious diseases been consulted.  Most likely source seems to be a right lower extremity cellulitis.  His pacemaker was implanted November 21, 2014.  The right atrial lead is a Engineering geologist 2088.  The right ventricular lead is a Teacher, adult education Isoflex Optim.  The most recent device interrogation shows stable lead parameters with a battery longevity estimated at 1 year 5 months.  He is pacing the atrium 99% and pacing the ventricle 99%.  1 of 2 blood cultures collected January 26 positive for Streptococcus pneumonia  2 of 2 blood cultures collected November 07, 2022 no growth to date  Today he tells me that he is feeling better since the day of admission.  He tells me that he had some open sores on his legs which have improved with IV antibiotics.   Past Medical History:  Diagnosis Date   Arthritis    Atrial fibrillation [facility])    Atrial fibrillation (HCC)    Carpal tunnel syndrome on both sides    CHF (congestive heart failure) ([place])    CKD (chronic kidney disease) 10/2018   recent diagnosis   Complete heart block ([place])    a. s/p St. Jude PPM 11/2014.   Complication of anesthesia     stayed overnight dt under too long d/t taking Parkinson's medication (2013?)   Coronary artery disease    GERD (gastroesophageal reflux disease)    "not as bad as it used to be"   Heart murmur    History of recent blood transfusion 07/2018   Hyperlipidemia    Hypertension    IBS (irritable bowel syndrome)    Lower extremity edema    Parkinson's disease    Presence of permanent cardiac pacemaker    St Jude   Shortness of breath dyspnea     Past Surgical History:  Procedure Laterality Date   APPENDECTOMY  12/2010   BACK SURGERY  08/06/2018   lower lumbarsacral region, has rods in place. (done at [facility])   [place]  11/20/2012   Known CAD with one vessel coronary disease of left descending artery by cardiac cath.    CARDIOVERSION N/A 11/21/2016   Procedure: CARDIOVERSION;  Surgeon: Deboraha Sprang, MD;  Location: [facility];  Service: Cardiovascular;  Laterality: N/A;   CARDIOVERSION N/A 03/08/2019   Procedure: CARDIOVERSION;  Surgeon: Fay Records, MD;  Location: [facility] ENDOSCOPY;  Service: Cardiovascular;  Laterality: N/A;   CARPAL TUNNEL RELEASE Right 10/22/2018   Procedure: CARPAL TUNNEL RELEASE;  Surgeon: Dereck Leep, MD;  Location: [facility];  Service: Orthopedics;  Laterality: Right;   CARPAL TUNNEL RELEASE Left 05/22/2019   Procedure: CARPAL TUNNEL RELEASE - LEFT;  Surgeon:  Hooten, Laurice Record, MD;  Location: [facility];  Service: Orthopedics;  Laterality: Left;   COLONOSCOPY  2018   COLONOSCOPY N/A 10/21/2020   Procedure: COLONOSCOPY;  Surgeon: Robert Bellow, MD;  Location: [facility] ENDOSCOPY;  Service: Endoscopy;  Laterality: N/A;   HERNIA REPAIR  91/63/8466   umbilical hernia repair   INSERT / REPLACE / REMOVE PACEMAKER     PACEMAKER INSERTION  11-21-14   STJ Assurity dual chamber pacemaker implanted by Dr Rayann Heman for CHB   PERMANENT PACEMAKER INSERTION N/A 11/21/2014   Procedure: PERMANENT PACEMAKER INSERTION;  Surgeon: Thompson Grayer, MD;  Location: [facility] CATH LAB;   Service: Cardiovascular;  Laterality: N/A;   TEE WITHOUT CARDIOVERSION N/A 11/21/2016   Procedure: TRANSESOPHAGEAL ECHOCARDIOGRAM (TEE);  Surgeon: Deboraha Sprang, MD;  Location: [facility] ORS;  Service: Cardiovascular;  Laterality: N/A;   UPPER GI ENDOSCOPY         Inpatient Medications: Scheduled Meds:  allopurinol  100 mg Oral Daily   apixaban  5 mg Oral BID   bisacodyl  5 mg Oral BID   carvedilol  3.125 mg Oral BID WC   flecainide  100 mg Oral BID   furosemide  40 mg Oral BID   losartan  25 mg Oral Daily   magnesium oxide  400 mg Oral QHS   multivitamin with minerals  1 tablet Oral Daily   pantoprazole  40 mg Oral Daily   potassium chloride SA  40 mEq Oral Daily   pramipexole  1 mg Oral QID   selegiline  5 mg Oral BID WC   senna-docusate  2 tablet Oral BID   Continuous Infusions:  cefTRIAXone (ROCEPHIN)  IV 2 g (11/08/22 1021)   PRN Meds: acetaminophen **OR** acetaminophen, magnesium hydroxide, metolazone, morphine injection, ondansetron **OR** ondansetron (ZOFRAN) IV, polyethylene glycol, traZODone  Allergies:    Allergies  Allergen Reactions   Linaclotide Shortness Of Breath and Other (See Comments)   Codeine Diarrhea, Nausea And Vomiting and Other (See Comments)   Oxycodone Nausea And Vomiting   Terazosin Other (See Comments)    Dizziness / altered mental state  Dizziness / altered mental state  Dizziness / altered mental state    Gabapentin Other (See Comments)    dizziness Other reaction(s): Other (See Comments) dizziness   Hydrocodone-Acetaminophen Diarrhea, Nausea And Vomiting and Other (See Comments)   Lubiprostone Other (See Comments)    Abdominal cramps Other reaction(s): Other (See Comments) Abdominal cramps Abdominal cramps   Norco [Hydrocodone-Acetaminophen] Diarrhea and Nausea And Vomiting   Omeprazole Diarrhea, Nausea And Vomiting and Other (See Comments)    Stomach cramps   Ropinirole Nausea Only and Other (See Comments)    Social History:    Social History   Socioeconomic History   Marital status: Married    Spouse name: Electrical engineer   Number of children: 1   Years of education: 14   Highest education level: Associate degree: academic program  Occupational History   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  Tobacco Use   Smoking status: Never   Smokeless tobacco: Never  Vaping Use   Vaping Use: Never used  Substance and Sexual Activity   Alcohol use: No    Alcohol/week: 0.0 standard drinks of alcohol   Drug use: No   Sexual activity: Never  Other Topics Concern   Not on file  Social History Narrative   Not on file   Social Determinants of Health   Financial Resource Strain: Low Risk  (10/26/2017)  Overall Financial Resource Strain (CARDIA)    Difficulty of Paying Living Expenses: Not hard at all  Food Insecurity: No Food Insecurity (10/26/2017)   Hunger Vital Sign    Worried About Running Out of Food in the Last Year: Never true    Ran Out of Food in the Last Year: Never true  Transportation Needs: No Transportation Needs (10/26/2017)   PRAPARE - Hydrologist (Medical): No    Lack of Transportation (Non-Medical): No  Physical Activity: Insufficiently Active (10/26/2017)   Exercise Vital Sign    Days of Exercise per Week: 7 days    Minutes of Exercise per Session: 20 min  Stress: No Stress Concern Present (10/26/2017)   Central    Feeling of Stress : Only a little  Social Connections: Moderately Integrated (10/26/2017)   Social Connection and Isolation Panel [NHANES]    Frequency of Communication with Friends and Family: More than three times a week    Frequency of Social Gatherings with Friends and Family: Never    Attends Religious Services: More than 4 times per year    Active Member of [employer] or Organizations: No    Attends Archivist Meetings: Never    Marital Status: Married  Arboriculturist Violence: Not At Risk (10/26/2017)   Humiliation, Afraid, Rape, and Kick questionnaire    Fear of Current or Ex-Partner: No    Emotionally Abused: No    Physically Abused: No    Sexually Abused: No    Family History:    Family History  Problem Relation Age of Onset   Heart disease Mother    Heart disease Father    Colon cancer Father    Stroke Sister      ROS:  Please see the history of present illness.   All other ROS reviewed and negative.     Physical Exam/Data:   Vitals:   11/07/22 2329 11/08/22 0500 11/08/22 0736 11/08/22 1547  BP: 131/66  128/71 124/70  Pulse: 70  69 69  Resp: '20  17 18  '$ Temp: 98.4 F (36.9 C)  97.6 F (36.4 C) 98.2 F (36.8 C)  TempSrc:   Oral   SpO2: 97%  99% 97%  Weight:  111 kg      Intake/Output Summary (Last 24 hours) at 11/08/2022 2024 Last data filed at 11/08/2022 1730 Gross per 24 hour  Intake --  Output 1500 ml  Net -1500 ml      11/08/2022    5:00 AM 11/07/2022    5:00 AM 11/06/2022    5:00 AM  Last 3 Weights  Weight (lbs) 244 lb 11.4 oz 255 lb 1.2 oz 255 lb 11.7 oz  Weight (kg) 111 kg 115.7 kg 116 kg     Body mass index is 33.19 kg/m.   General: Frail, elderly, no distress in bed at 45 degrees Cardiac:  normal S1, S2; RRR; no murmur .  CIED pocket well-healed without evidence of pocket infection or hematoma. Lungs:  clear to auscultation bilaterally, no wheezing, rhonchi or rales  Psych:  Normal affect   EKG:  The EKG was personally reviewed and demonstrates: Ventricular paced with a paced QRS of 235 ms.  Telemetry:  Telemetry was personally reviewed and demonstrates: Ventricular paced rhythm  Relevant CV Studies:  November 07, 2022 echo shows an ejection fraction of 40 to 45%, normal RV, dilated atria.  Dyssynchronous septal activation  Laboratory Data:  High Sensitivity Troponin:  No results for input(s): "TROPONINIHS" in the last 720 hours.   Chemistry Recent Labs  Lab 11/06/22 0627 11/07/22 0244  11/08/22 0935  NA 135 140 140  K 4.4 4.0 3.8  CL 107 104 100  CO2 '25 26 29  '$ GLUCOSE 100* 104* 126*  BUN 43* 43* 44*  CREATININE 2.11* 1.83* 1.61*  CALCIUM 8.2* 8.8* 9.4  GFRNONAA 32* 38* 44*  ANIONGAP 3* 10 11    Recent Labs  Lab 11/04/22 2103  PROT 7.0  ALBUMIN 4.0  AST 30  ALT 18  ALKPHOS 66  BILITOT 1.5*   Lipids No results for input(s): "CHOL", "TRIG", "HDL", "LABVLDL", "LDLCALC", "CHOLHDL" in the last 168 hours.  Hematology Recent Labs  Lab 11/05/22 0506 11/06/22 0627 11/07/22 0244  WBC 15.9* 11.6* 8.8  RBC 4.22 4.10* 4.17*  HGB 13.0 12.4* 12.4*  HCT 40.4 39.0 39.2  MCV 95.7 95.1 94.0  MCH 30.8 30.2 29.7  MCHC 32.2 31.8 31.6  RDW 14.9 14.8 14.5  PLT 166 151 185   Thyroid No results for input(s): "TSH", "FREET4" in the last 168 hours.  BNP Recent Labs  Lab 11/04/22 2103  BNP 1,216.0*    DDimer No results for input(s): "DDIMER" in the last 168 hours.   Radiology/Studies:     Assessment and Plan:   Mr. Elsayed is a pleasant 77 year old man with chronic systolic heart failure, atrial fibrillation, complete heart block with a dual-chamber permanent pacemaker in situ who presented to hospital with malaise and was found to be septic with 1 out of 4 cultures positive for Streptococcus pneumonia.  Most likely source is cellulitis.  #Bacteremia Most likely source is cellulitis.  No evidence thus far of device involvement.  He will need a transesophageal echo to exclude valvular/lead involvement.  If bacteremia were to become recurrent on future blood cultures, would need to revisit removal of his current pacemaker system.  #Complete heart block #Permanent pacemaker in situ The patient paces 100% of the time and has an extremely wide paced QRS (greater than 230 ms).  Given the presence of systolic dysfunction, will need to discuss potential upgrade of his system to a biventricular device as an outpatient.  For questions or updates, please contact [place] Please consult [email or web address] for contact info under    Signed, Vickie Epley, MD    ---------------------------------------------  ADDENDUM 1:54 PM  TEE reviewed. There is a thin fibrinous mobile echodensity on the RA lead. Unclear clinical significance as this is frequently seen on chronic leads during TEE imaging. No evidence of valvular involvement.  Recommend treatment with IV antibiotics per ID recommendations and then surveillance cultures.   Recommend stopping flecainide given degree of LV dysfunction present on his echo. If recurrence of AF, will need to use amiodarone.   Had a long discussion with the patient and his wife who is at the bedside this evening.  I discussed the findings thus far in the workup.  I discussed possible treatments including antibiotics versus lead extraction.  I discussed the lead extraction procedure including the risks.  Given his medical comorbidities, I think he is overall a poor candidate for transvenous lead extraction. After our discussion we mutually decided the best course of action would be to proceed with IV antibiotics per infectious disease recommendations with follow-up in the outpatient setting.      Lysbeth Galas T. Quentin Ore, MD, [facility], [facility] Cardiac Electrophysiology 5:16 PM

## 2022-11-08 NOTE — Plan of Care (Signed)
  Problem: Activity: Goal: Risk for activity intolerance will decrease Outcome: Progressing   Problem: Elimination: Goal: Will not experience complications related to bowel motility Outcome: Progressing   Problem: Skin Integrity: Goal: Risk for impaired skin integrity will decrease Outcome: Progressing   Problem: Safety: Goal: Ability to remain free from injury will improve Outcome: Progressing   Problem: Pain Managment: Goal: General experience of comfort will improve Outcome: Progressing   Problem: Education: Goal: Knowledge of General Education information will improve Description: Including pain rating scale, medication(s)/side effects and non-pharmacologic comfort measures Outcome: Progressing

## 2022-11-08 NOTE — Progress Notes (Signed)
Progress Note  Patient Name: Matthew Howe Date of Encounter: 11/08/2022  Primary Cardiologist: Rockey Situ Primary Electrophysiologist: [place] for TEE and EP evaluation on 11/09/2022.  Vital signs stable.  No chest pain or dyspnea. No cardiac concerns at this time.   Inpatient Medications    Scheduled Meds:  allopurinol  100 mg Oral Daily   apixaban  5 mg Oral BID   bisacodyl  5 mg Oral BID   carvedilol  3.125 mg Oral BID WC   flecainide  100 mg Oral BID   furosemide  40 mg Intravenous Q12H   losartan  25 mg Oral Daily   magnesium oxide  400 mg Oral QHS   multivitamin with minerals  1 tablet Oral Daily   pantoprazole  40 mg Oral Daily   potassium chloride SA  40 mEq Oral Daily   pramipexole  1 mg Oral QID   selegiline  5 mg Oral BID WC   senna-docusate  2 tablet Oral BID   Continuous Infusions:  cefTRIAXone (ROCEPHIN)  IV 2 g (11/07/22 1047)   PRN Meds: acetaminophen **OR** acetaminophen, magnesium hydroxide, metolazone, morphine injection, ondansetron **OR** ondansetron (ZOFRAN) IV, polyethylene glycol, traZODone   Vital Signs    Vitals:   11/07/22 1755 11/07/22 2113 11/07/22 2329 11/08/22 0500  BP: 129/71 131/69 131/66   Pulse: 69 70 70   Resp: '18 16 20   '$ Temp: 97.9 F (36.6 C) 97.8 F (36.6 C) 98.4 F (36.9 C)   TempSrc:  Oral    SpO2: 100% 98% 97%   Weight:    111 kg    Intake/Output Summary (Last 24 hours) at 11/08/2022 4128 Last data filed at 11/08/2022 0111 Gross per 24 hour  Intake --  Output 3000 ml  Net -3000 ml   Filed Weights   11/06/22 0500 11/07/22 0500 11/08/22 0500  Weight: 116 kg 115.7 kg 111 kg    Telemetry    V-paced with artifact - Personally Reviewed  ECG    No new tracings - Personally Reviewed  Physical Exam   GEN: No acute distress.   Neck: No JVD. Cardiac: RRR, no murmurs, rubs, or gallops.  Respiratory: Mildly diminished along the bilateral bases with poor inspiratory effort.  GI: Soft,  nontender, non-distended.   MS: Trivial pretibial edema bilaterally; No deformity. Neuro:  Alert and oriented x 3; Nonfocal.  Psych: Normal affect.  Labs    Chemistry Recent Labs  Lab 11/04/22 2103 11/05/22 0506 11/06/22 0627 11/07/22 0244  NA 138 140 135 140  K 4.0 4.5 4.4 4.0  CL 104 105 107 104  CO2 '23 25 25 26  '$ GLUCOSE 186* 133* 100* 104*  BUN 42* 44* 43* 43*  CREATININE 2.22* 2.19* 2.11* 1.83*  CALCIUM 8.8* 8.6* 8.2* 8.8*  PROT 7.0  --   --   --   ALBUMIN 4.0  --   --   --   AST 30  --   --   --   ALT 18  --   --   --   ALKPHOS 66  --   --   --   BILITOT 1.5*  --   --   --   GFRNONAA 30* 31* 32* 38*  ANIONGAP 11 10 3* 10     Hematology Recent Labs  Lab 11/05/22 0506 11/06/22 0627 11/07/22 0244  WBC 15.9* 11.6* 8.8  RBC 4.22 4.10* 4.17*  HGB 13.0 12.4* 12.4*  HCT 40.4 39.0 39.2  MCV 95.7 95.1 94.0  MCH 30.8 30.2 29.7  MCHC 32.2 31.8 31.6  RDW 14.9 14.8 14.5  PLT 166 151 185    Cardiac EnzymesNo results for input(s): "TROPONINI" in the last 168 hours. No results for input(s): "TROPIPOC" in the last 168 hours.   BNP Recent Labs  Lab 11/04/22 2103  BNP 1,216.0*     DDimer No results for input(s): "DDIMER" in the last 168 hours.   Radiology        Cardiac Studies   2D echo 11/06/2022: 1. Cannot assess EF due to poor acoustical windows and patient refusal to  complete study. Left ventricular endocardial border not optimally defined  to evaluate regional wall motion. There is mild concentric left  ventricular hypertrophy. Left ventricular   diastolic function could not be evaluated.   2. Right ventricular systolic function is normal. The right ventricular  size is normal. Tricuspid regurgitation signal is inadequate for assessing  PA pressure.   3. The mitral valve is normal in structure. Trivial mitral valve  regurgitation. No evidence of mitral stenosis.   4. The aortic valve is tricuspid. Aortic valve regurgitation is mild.  Aortic valve  sclerosis is present, with no evidence of aortic valve  stenosis. Aortic valve Vmax measures 1.07 m/s.   5. Aortic dilatation noted. There is mild dilatation of the aortic root,  measuring 38 mm   6. Rcommend repeat study with definity contrast.  __________  Limited echo 11/07/2022: 1. Left ventricular ejection fraction, by estimation, is 40 to 45%. The  left ventricle has mildly decreased function. Left ventricular endocardial  border not optimally defined to evaluate regional wall motion. There is  moderate left ventricular  hypertrophy. Left ventricular diastolic parameters are indeterminate.   2. Right ventricular systolic function is normal. The right ventricular  size is normal. Tricuspid regurgitation signal is inadequate for assessing  PA pressure.   3. Left atrial size was mildly dilated.   4. Right atrial size was mildly dilated.   5. The mitral valve is normal in structure. No evidence of mitral valve  regurgitation. No evidence of mitral stenosis.   6. The aortic valve is normal in structure. Aortic valve regurgitation is  mild. No aortic stenosis is present.   Patient Profile     77 y.o. male with history of CAD medically managed, persistent A-fib status post DCCV in 11/2016 and again in 02/2019, complete heart block status post PPM implantation in 11/2014, HFpEF, Parkinson's disease, HTN, and HLD who is being seen today for the evaluation of Streptococcus pneumonia bacteremia and cardiomyopathy at the request of Dr. Reesa Chew.   Assessment & Plan    Sepsis due to cellulitis and strep pneumo bacteremia:  -Patient will need TEE for further evaluation of bacterial endocarditis as well as for further imaging of pacemaker leads -Due to echo tech schedule, we will pursue TEE on 1/31 at 12:30 with Dr. Garen Lah -NPO at midnight on 11/09/2022 -EP to evaluate  Acute HFrEF:  -Uncertain chronicity and etiology, BNP elevated at 1216  -Transition IV Lasix to oral furosemide 40 mg  bid -Continue PTA carvedilol  -Now on losartan, with first dose to be given today in preparation for transition to [facility] following 36-hour washout of ACE inhibitor -With acute on CKD stage III, defer addition of MRA or SGLT2 inhibitor at this time -Given drop in EF, will need ischemic evaluation once improved from acute bacterial illness   Persistent A-fib:  -Currently V-paced  -Carvedilol  -With his new  cardiomyopathy, antiarrhythmic therapy may need to be revisited as flecainide may not be the ideal medication for him, EP to evaluate tomorrow -CHA2DS2-VASc at least 5 (CHF, HTN, age x 2, vascular disease)  -Currently on PTA apixaban 5 mg twice daily (does not meet reduced dosing criteria)   History of complete heart block: -Status post PPM -EP to evaluate patient, particularly in light of bacteremia and case device extraction is indicated  Acute on CKD stage III:  -Renal function improving     Shared Decision Making/Informed Consent{   The risks [esophageal damage, perforation (1:10,000 risk), bleeding, pharyngeal hematoma as well as other potential complications associated with conscious sedation including aspiration, arrhythmia, respiratory failure and death], benefits (treatment guidance and diagnostic support) and alternatives of a transesophageal echocardiogram were discussed in detail with Mr. Fonseca and he is willing to proceed.         For questions or updates, please contact [place] Please consult [email or web address] for contact info under Cardiology/STEMI.    Signed, Christell Faith, PA-C [facility] Pager: [phone] 11/08/2022, 7:12 AM

## 2022-11-08 NOTE — Consult Note (Signed)
[facility]    Date of Admission:  11/04/2022     Reason for Consult: Bacteremia     Referring Physician: Dr Reesa Chew  Current antibiotics: Ceftriaxone   ASSESSMENT:    77 y.o. male admitted with:  Invasive strep pneumo bacteremia:  Etiology of bacteremia appears to be secondary to right leg cellulitis although this would be unusual etiology with strep pneumo.  No findings on exam for septic arthritis and there is no other localizing source noted on CT chest and CT abd/pelvis.  The erythema and swelling of his right leg is also much improved following antibiotic therapy and bacteremia has been low grade with only 1 of 3 admission cultures positive and repeat cultures 11/07/22 NGTD.  TTE did not show vegetation, TEE pending for 11/09/22. History of CHB: Status post CIED placement in 2016. Acute HFrEF. Acute on CKD 3: Creatinine improving. History of posterior fusion L4-5.  RECOMMENDATIONS:    Continue ceftriaxone 2gm daily Agree with TEE in the setting of CIED and bacteremia Appreciate EP input on device management strategy If TEE is negative and bacteremia remains low-grade, do not feel that device extraction is absolutely indicated at this time Follow blood cultures Lab monitoring Will follow   Principal Problem:   Sepsis due to cellulitis [facility]) Active Problems:   Essential hypertension   Acute on chronic diastolic CHF (congestive heart failure) (HCC)   Paroxysmal atrial fibrillation (HCC)   GERD without esophagitis   History of gout   Peripheral neuropathy   Stage III chronic kidney disease (HCC)   Acute respiratory failure with hypoxia (HCC)   MEDICATIONS:    Scheduled Meds:  allopurinol  100 mg Oral Daily   apixaban  5 mg Oral BID   bisacodyl  5 mg Oral BID   carvedilol  3.125 mg Oral BID WC   flecainide  100 mg Oral BID   furosemide  40 mg Intravenous Q12H   losartan  25 mg Oral Daily   magnesium oxide  400 mg Oral QHS    multivitamin with minerals  1 tablet Oral Daily   pantoprazole  40 mg Oral Daily   potassium chloride SA  40 mEq Oral Daily   pramipexole  1 mg Oral QID   selegiline  5 mg Oral BID WC   senna-docusate  2 tablet Oral BID   Continuous Infusions:  cefTRIAXone (ROCEPHIN)  IV 2 g (11/07/22 1047)   PRN Meds:.acetaminophen **OR** acetaminophen, magnesium hydroxide, metolazone, morphine injection, ondansetron **OR** ondansetron (ZOFRAN) IV, polyethylene glycol, traZODone  HPI:    Matthew Howe is a 77 y.o. male with PMHx significant for CAD, atrial fibrillation, CHB s/p PPM (2016) who presented to [facility] on 11/04/22 with generalized malaise/weakness and fevers.  He was found to be bacteremic with strep pneumo.  Initially there was concern for pneumonia based on chest x-ray however CT chest did not show evidence of this and he was found to have mild heart failure.  Initial TTE was difficult study but there was no obvious vegetation.  TEE is planned for Wednesday with cardiology.  EP input is pending regarding management of his CIED in the setting of bacteremia and we are asked to see for antibiotic recommendations.  Further work up has indicated possible right lower extremity cellulitis as a source of his bacteremia.  He is currently on ceftriaxone.  Repeat blood cultures drawn yesterday are NGTD, his fevers have abated on antibiotics, and WBC improved.    He is  seen this morning with his wife x 50 years at the bedside.  She reports that on Thursday, his right leg was slightly more red than the left.  He has been having trouble with leg swelling.  On Friday, he went to the bathroom and noticed he was febrile and weak.  It was at that point, she noticed his right leg was now hot and beet red.  She reports it is "99% better" this morning.      Past Medical History:  Diagnosis Date   Arthritis    Atrial fibrillation [facility])    Atrial fibrillation (HCC)    Carpal tunnel syndrome on both sides    CHF  (congestive heart failure) ([place])    CKD (chronic kidney disease) 10/2018   recent diagnosis   Complete heart block ([place])    a. s/p [employer] PPM 11/2014.   Complication of anesthesia    stayed overnight dt under too long d/t taking Parkinson's medication (2013?)   Coronary artery disease    GERD (gastroesophageal reflux disease)    "not as bad as it used to be"   Heart murmur    History of recent blood transfusion 07/2018   Hyperlipidemia    Hypertension    IBS (irritable bowel syndrome)    Lower extremity edema    Parkinson's disease    Presence of permanent cardiac pacemaker    [employer] Jude   Shortness of breath dyspnea     Social History   Tobacco Use   Smoking status: Never   Smokeless tobacco: Never  Vaping Use   Vaping Use: Never used  Substance Use Topics   Alcohol use: No    Alcohol/week: 0.0 standard drinks of alcohol   Drug use: No    Family History  Problem Relation Age of Onset   Heart disease Mother    Heart disease Father    Colon cancer Father    Stroke Sister     Allergies  Allergen Reactions   Linaclotide Shortness Of Breath and Other (See Comments)   Codeine Diarrhea, Nausea And Vomiting and Other (See Comments)   Oxycodone Nausea And Vomiting   Terazosin Other (See Comments)    Dizziness / altered mental state  Dizziness / altered mental state  Dizziness / altered mental state    Gabapentin Other (See Comments)    dizziness Other reaction(s): Other (See Comments) dizziness   Hydrocodone-Acetaminophen Diarrhea, Nausea And Vomiting and Other (See Comments)   Lubiprostone Other (See Comments)    Abdominal cramps Other reaction(s): Other (See Comments) Abdominal cramps Abdominal cramps   Norco [Hydrocodone-Acetaminophen] Diarrhea and Nausea And Vomiting   Omeprazole Diarrhea, Nausea And Vomiting and Other (See Comments)    Stomach cramps   Ropinirole Nausea Only and Other (See Comments)    Review of Systems  Constitutional:  Positive for  fever.  Respiratory: Negative.    Cardiovascular:  Positive for leg swelling.  Skin:  Positive for rash.  All other systems reviewed and are negative.   OBJECTIVE:   Blood pressure 131/66, pulse 70, temperature 98.4 F (36.9 C), resp. rate 20, weight 111 kg, SpO2 97 %. Body mass index is 33.19 kg/m.  Physical Exam Constitutional:      General: He is not in acute distress.    Appearance: He is well-developed.  HENT:     Head: Normocephalic and atraumatic.  Eyes:     Extraocular Movements: Extraocular movements intact.     Conjunctiva/sclera: Conjunctivae normal.  Cardiovascular:  Comments: Left sided pacemaker site non-tender, non-erythematous.  Pulmonary:     Effort: Pulmonary effort is normal. No respiratory distress.  Abdominal:     General: There is no distension.     Palpations: Abdomen is soft.  Musculoskeletal:     Cervical back: Normal range of motion and neck supple.  Skin:    General: Skin is warm and dry.     Comments: Mild lower extremity edema. Right lower leg with mild erythema and venous stasis dermatitiis.  Right thigh soft and non tender.   Neurological:     General: No focal deficit present.     Mental Status: He is alert.     Cranial Nerves: No cranial nerve deficit.  Psychiatric:        Mood and Affect: Mood normal.        Behavior: Behavior normal.      Lab Results: Lab Results  Component Value Date   WBC 8.8 11/07/2022   HGB 12.4 (L) 11/07/2022   HCT 39.2 11/07/2022   MCV 94.0 11/07/2022   PLT 185 11/07/2022    Lab Results  Component Value Date   NA 140 11/07/2022   K 4.0 11/07/2022   CO2 26 11/07/2022   GLUCOSE 104 (H) 11/07/2022   BUN 43 (H) 11/07/2022   CREATININE 1.83 (H) 11/07/2022   CALCIUM 8.8 (L) 11/07/2022   GFRNONAA 38 (L) 11/07/2022   GFRAA 43 (L) 04/28/2020    Lab Results  Component Value Date   ALT 18 11/04/2022   AST 30 11/04/2022   ALKPHOS 66 11/04/2022   BILITOT 1.5 (H) 11/04/2022    No results found  for: "CRP"  No results found for: "ESRSEDRATE"  I have reviewed the micro and lab results in Epic.  Imaging: ECHOCARDIOGRAM LIMITED  Result Date: 11/07/2022    ECHOCARDIOGRAM LIMITED REPORT   Patient Name:   LORENA CLEARMAN Date of Exam: 11/07/2022 Medical Rec #:  [id number]     Height:       72.0 in Accession #:    [id number]    Weight:       255.1 lb Date of Birth:  May 15, 1946     BSA:          2.363 m Patient Age:    70 years      BP:           114/64 mmHg Patient Gender: M             HR:           70 bpm. Exam Location:  [facility] Procedure: Limited Echo, Cardiac Doppler, Color Doppler and Intracardiac            Opacification Agent Indications:     Abnormal ECG R94.31  History:         Patient has prior history of Echocardiogram examinations, most                  recent 11/06/2022. Pacemaker, Arrythmias:Atrial Fibrillation;                  Risk Factors:Hypertension and Dyslipidemia.  Sonographer:     Sherrie Sport Referring Phys:  [id number] [facility] AMIN Diagnosing Phys: Kathlyn Sacramento MD  Sonographer Comments: Suboptimal apical window. IMPRESSIONS  1. Left ventricular ejection fraction, by estimation, is 40 to 45%. The left ventricle has mildly decreased function. Left ventricular endocardial border not optimally defined to evaluate regional wall motion. There is moderate left ventricular hypertrophy. Left ventricular diastolic  parameters are indeterminate.  2. Right ventricular systolic function is normal. The right ventricular size is normal. Tricuspid regurgitation signal is inadequate for assessing PA pressure.  3. Left atrial size was mildly dilated.  4. Right atrial size was mildly dilated.  5. The mitral valve is normal in structure. No evidence of mitral valve regurgitation. No evidence of mitral stenosis.  6. The aortic valve is normal in structure. Aortic valve regurgitation is mild. No aortic stenosis is present. FINDINGS  Left Ventricle: Left ventricular ejection fraction, by estimation, is 40 to 45%.  The left ventricle has mildly decreased function. Left ventricular endocardial border not optimally defined to evaluate regional wall motion. Definity contrast agent was given IV to delineate the left ventricular endocardial borders. The left ventricular internal cavity size was normal in size. There is moderate left ventricular hypertrophy. Left ventricular diastolic parameters are indeterminate. Right Ventricle: The right ventricular size is normal. No increase in right ventricular wall thickness. Right ventricular systolic function is normal. Tricuspid regurgitation signal is inadequate for assessing PA pressure. Left Atrium: Left atrial size was mildly dilated. Right Atrium: Right atrial size was mildly dilated. Pericardium: There is no evidence of pericardial effusion. Mitral Valve: The mitral valve is normal in structure. No evidence of mitral valve stenosis. Tricuspid Valve: The tricuspid valve is normal in structure. Tricuspid valve regurgitation is not demonstrated. No evidence of tricuspid stenosis. Aortic Valve: The aortic valve is normal in structure. Aortic valve regurgitation is mild. No aortic stenosis is present. Pulmonic Valve: The pulmonic valve was normal in structure. Pulmonic valve regurgitation is not visualized. No evidence of pulmonic stenosis. Aorta: The aortic root is normal in size and structure. Venous: The inferior vena cava was not well visualized. IAS/Shunts: No atrial level shunt detected by color flow Doppler. Additional Comments: A device lead is visualized. Spectral Doppler performed. Color Doppler performed.  LEFT VENTRICLE PLAX 2D LVIDd:         5.00 cm Diastology LVIDs:         4.30 cm LV e' lateral:   8.38 cm/s LV PW:         1.40 cm LV E/e' lateral: 8.9 LV IVS:        1.90 cm  RIGHT VENTRICLE RV S prime:     9.68 cm/s TAPSE (M-mode): 2.2 cm LEFT ATRIUM           Index        RIGHT ATRIUM           Index LA diam:      4.70 cm 1.99 cm/m   RA Area:     23.10 cm LA Vol (A2C):  66.4 ml 28.10 ml/m  RA Volume:   74.10 ml  31.36 ml/m LA Vol (A4C): 69.8 ml 29.54 ml/m   AORTA Ao Root diam: 2.80 cm MITRAL VALVE               TRICUSPID VALVE MV Area (PHT): 5.54 cm    TR Peak grad:   15.1 mmHg MV Decel Time: 137 msec    TR Vmax:        194.00 cm/s MV E velocity: 74.90 cm/s Kathlyn Sacramento MD Electronically signed by Kathlyn Sacramento MD Signature Date/Time: 11/07/2022/2:13:09 PM    Final    ECHOCARDIOGRAM COMPLETE  Result Date: 11/06/2022    ECHOCARDIOGRAM REPORT   Patient Name:   JASIER CALABRETTA Date of Exam: 11/06/2022 Medical Rec #:  [id number]     Height:  72.0 in Accession #:    [id number]    Weight:       255.7 lb Date of Birth:  [birth year]     BSA:          2.365 m Patient Age:    76 years      BP:           106/63 mmHg Patient Gender: M             HR:           70 bpm. Exam Location:  [facility] Procedure: 2D Echo Indications:     CHF I50.31  History:         Patient has prior history of Echocardiogram examinations, most                  recent 03/24/2019.  Sonographer:     Kathlen Brunswick RDCS Referring Phys:  [id number] RAVI PAHWANI Diagnosing Phys: Fransico Him MD  Sonographer Comments: Technically difficult study due to poor echo windows. Image acquisition challenging due to patient body habitus and Image acquisition challenging due to uncooperative patient. Unable to complete echo due to patient's request. The patient declined Definity ultrasound imaging agent. IMPRESSIONS  1. Cannot assess EF due to poor acoustical windows and patient refusal to complete study. Left ventricular endocardial border not optimally defined to evaluate regional wall motion. There is mild concentric left ventricular hypertrophy. Left ventricular  diastolic function could not be evaluated.  2. Right ventricular systolic function is normal. The right ventricular size is normal. Tricuspid regurgitation signal is inadequate for assessing PA pressure.  3. The mitral valve is normal in structure. Trivial mitral valve  regurgitation. No evidence of mitral stenosis.  4. The aortic valve is tricuspid. Aortic valve regurgitation is mild. Aortic valve sclerosis is present, with no evidence of aortic valve stenosis. Aortic valve Vmax measures 1.07 m/s.  5. Aortic dilatation noted. There is mild dilatation of the aortic root, measuring 38 mm  6. Rcommend repeat study with definity contrast. FINDINGS  Left Ventricle: Left ventricular ejection fraction, by estimation, is Cannot assess EF due to poor acoustical windows and patient refusal to complete study.%. The left ventricle has normal function. Left ventricular endocardial border not optimally defined to evaluate regional wall motion. The left ventricular internal cavity size was normal in size. There is mild concentric left ventricular hypertrophy. Abnormal (paradoxical) septal motion, consistent with left bundle branch block. Left ventricular diastolic function could not be evaluated. Right Ventricle: The right ventricular size is normal. No increase in right ventricular wall thickness. Right ventricular systolic function is normal. Tricuspid regurgitation signal is inadequate for assessing PA pressure. Left Atrium: Left atrial size was normal in size. Right Atrium: Right atrial size was normal in size. Pericardium: There is no evidence of pericardial effusion. Mitral Valve: The mitral valve is normal in structure. Trivial mitral valve regurgitation. No evidence of mitral valve stenosis. Tricuspid Valve: The tricuspid valve is normal in structure. Tricuspid valve regurgitation is not demonstrated. No evidence of tricuspid stenosis. Aortic Valve: The aortic valve is tricuspid. Aortic valve regurgitation is mild. Aortic valve sclerosis is present, with no evidence of aortic valve stenosis. Aortic valve peak gradient measures 4.6 mmHg. Pulmonic Valve: The pulmonic valve was normal in structure. Pulmonic valve regurgitation is not visualized. No evidence of pulmonic stenosis. Aorta:  Aortic dilatation noted. There is mild dilatation of the aortic root, measuring 38 mm. Venous: The inferior vena cava was not well visualized. IAS/Shunts: No atrial  level shunt detected by color flow Doppler.  LEFT VENTRICLE PLAX 2D LVIDd:         5.70 cm LVIDs:         4.00 cm LV PW:         1.30 cm LV IVS:        1.30 cm LVOT diam:     2.40 cm LVOT Area:     4.52 cm  LEFT ATRIUM           Index LA diam:      5.20 cm 2.20 cm/m LA Vol (A4C): 50.5 ml 21.35 ml/m  AORTIC VALVE              PULMONIC VALVE AV Vmax:      107.00 cm/s PV Vmax:       0.82 m/s AV Peak Grad: 4.6 mmHg    PV Peak grad:  2.7 mmHg  AORTA Ao Root diam: 3.80 cm Ao Asc diam:  3.40 cm MITRAL VALVE MV Area (PHT): 4.97 cm    SHUNTS MV Decel Time: 153 msec    Systemic Diam: 2.40 cm MV E velocity: 34.90 cm/s MV A velocity: 29.00 cm/s MV E/A ratio:  1.20 Fransico Him MD Electronically signed by Fransico Him MD Signature Date/Time: 11/06/2022/5:39:31 PM    Final      Imaging independently reviewed in Epic.  [facility] [phone] pager 11/08/2022, 7:27 AM

## 2022-11-09 ENCOUNTER — Inpatient Hospital Stay (HOSPITAL_COMMUNITY)
Admit: 2022-11-09 | Discharge: 2022-11-09 | Disposition: A | Discharge status: Home / Self Care | Payer: Medicare Other | Attending: Physician Assistant | Admitting: Physician Assistant

## 2022-11-09 ENCOUNTER — Encounter
Admission: EM | Disposition: A | Discharge status: Home Health | Payer: Self-pay | Source: Home / Self Care | Attending: Internal Medicine

## 2022-11-09 DIAGNOSIS — L0390 Cellulitis, unspecified: Secondary | ICD-10-CM | POA: Diagnosis not present

## 2022-11-09 DIAGNOSIS — A419 Sepsis, unspecified organism: Secondary | ICD-10-CM | POA: Diagnosis not present

## 2022-11-09 DIAGNOSIS — R7881 Bacteremia: Secondary | ICD-10-CM

## 2022-11-09 DIAGNOSIS — I442 Atrioventricular block, complete: Secondary | ICD-10-CM

## 2022-11-09 DIAGNOSIS — I5033 Acute on chronic diastolic (congestive) heart failure: Secondary | ICD-10-CM | POA: Diagnosis not present

## 2022-11-09 DIAGNOSIS — L03115 Cellulitis of right lower limb: Secondary | ICD-10-CM | POA: Diagnosis not present

## 2022-11-09 DIAGNOSIS — N1832 Chronic kidney disease, stage 3b: Secondary | ICD-10-CM | POA: Diagnosis not present

## 2022-11-09 DIAGNOSIS — I351 Nonrheumatic aortic (valve) insufficiency: Secondary | ICD-10-CM | POA: Diagnosis not present

## 2022-11-09 HISTORY — PX: TEE WITHOUT CARDIOVERSION: SHX5443

## 2022-11-09 LAB — BASIC METABOLIC PANEL
Anion gap: 6 (ref 5–15)
BUN: 47 mg/dL — ABNORMAL HIGH (ref 8–23)
CO2: 27 mmol/L (ref 22–32)
Calcium: 8.9 mg/dL (ref 8.9–10.3)
Chloride: 107 mmol/L (ref 98–111)
Creatinine, Ser: 1.63 mg/dL — ABNORMAL HIGH (ref 0.61–1.24)
GFR, Estimated: 44 mL/min — ABNORMAL LOW (ref >60)
Glucose, Bld: 100 mg/dL — ABNORMAL HIGH (ref 70–99)
Potassium: 4.2 mmol/L (ref 3.5–5.1)
Sodium: 140 mmol/L (ref 135–145)

## 2022-11-09 LAB — CULTURE, BLOOD (ROUTINE X 2)
Culture: NO GROWTH
Special Requests: ADEQUATE

## 2022-11-09 LAB — ECHO TEE

## 2022-11-09 SURGERY — ECHOCARDIOGRAM, TRANSESOPHAGEAL
Anesthesia: Moderate Sedation

## 2022-11-09 MED ORDER — BUTAMBEN-TETRACAINE-BENZOCAINE 2-2-14 % EX AERO
INHALATION_SPRAY | CUTANEOUS | Status: AC
  Filled 2022-11-09: qty 5

## 2022-11-09 MED ORDER — MIDAZOLAM HCL 2 MG/2ML IJ SOLN
INTRAMUSCULAR | Status: AC | PRN
  Administered 2022-11-09: .5 mg via INTRAVENOUS
  Administered 2022-11-09: 1 mg via INTRAVENOUS

## 2022-11-09 MED ORDER — SODIUM CHLORIDE 0.9% FLUSH: 10.0000 mL | INTRAVENOUS | Status: DC | PRN

## 2022-11-09 MED ORDER — FUROSEMIDE 40 MG PO TABS
60.0000 mg | ORAL_TABLET | Freq: Every day | ORAL | Status: DC
  Administered 2022-11-10: 60 mg via ORAL
  Filled 2022-11-09: qty 1

## 2022-11-09 MED ORDER — SODIUM CHLORIDE 0.9 % IV SOLN: INTRAVENOUS | Status: DC

## 2022-11-09 MED ORDER — SACUBITRIL-VALSARTAN 24-26 MG PO TABS
1.0000 | ORAL_TABLET | Freq: Two times a day (BID) | ORAL | Status: DC
  Administered 2022-11-09 – 2022-11-10 (×3): 1 via ORAL
  Filled 2022-11-09 (×3): qty 1

## 2022-11-09 MED ORDER — FENTANYL CITRATE (PF) 100 MCG/2ML IJ SOLN
INTRAMUSCULAR | Status: AC
  Filled 2022-11-09: qty 2

## 2022-11-09 MED ORDER — FUROSEMIDE 40 MG PO TABS: 40.0000 mg | ORAL_TABLET | Freq: Every day | ORAL | Status: DC

## 2022-11-09 MED ORDER — FENTANYL CITRATE (PF) 100 MCG/2ML IJ SOLN
INTRAMUSCULAR | Status: AC | PRN
  Administered 2022-11-09: 25 ug via INTRAVENOUS

## 2022-11-09 MED ORDER — SODIUM CHLORIDE 0.9% FLUSH
10.0000 mL | Freq: Two times a day (BID) | INTRAVENOUS | Status: DC
  Administered 2022-11-09 – 2022-11-10 (×2): 10 mL

## 2022-11-09 MED ORDER — LIDOCAINE VISCOUS HCL 2 % MT SOLN
OROMUCOSAL | Status: AC | PRN
  Administered 2022-11-09: 15 mL via OROMUCOSAL

## 2022-11-09 MED ORDER — LIDOCAINE VISCOUS HCL 2 % MT SOLN
OROMUCOSAL | Status: AC
  Filled 2022-11-09: qty 15

## 2022-11-09 MED ORDER — MIDAZOLAM HCL 2 MG/2ML IJ SOLN
INTRAMUSCULAR | Status: AC
  Filled 2022-11-09: qty 4

## 2022-11-09 NOTE — Plan of Care (Signed)
  Problem: Respiratory: Goal: Ability to maintain adequate ventilation will improve Outcome: Progressing   Problem: Education: Goal: Knowledge of General Education information will improve Description: Including pain rating scale, medication(s)/side effects and non-pharmacologic comfort measures Outcome: Progressing   Problem: Clinical Measurements: Goal: Respiratory complications will improve Outcome: Progressing   Problem: Coping: Goal: Level of anxiety will decrease Outcome: Progressing   Problem: Elimination: Goal: Will not experience complications related to bowel motility Outcome: Progressing   Problem: Elimination: Goal: Will not experience complications related to urinary retention Outcome: Progressing   Problem: Skin Integrity: Goal: Risk for impaired skin integrity will decrease Outcome: Progressing

## 2022-11-09 NOTE — Progress Notes (Addendum)
Progress Note  Patient Name: Matthew Howe Date of Encounter: 11/09/2022  Primary Cardiologist: Rockey Situ Primary Electrophysiologist: [place] for TEE and EP evaluation today.  Vital signs stable.  No chest pain or dyspnea. No cardiac concerns at this time.   Inpatient Medications    Scheduled Meds:  allopurinol  100 mg Oral Daily   apixaban  5 mg Oral BID   bisacodyl  5 mg Oral BID   carvedilol  3.125 mg Oral BID WC   flecainide  100 mg Oral BID   furosemide  40 mg Oral BID   losartan  25 mg Oral Daily   magnesium oxide  400 mg Oral QHS   multivitamin with minerals  1 tablet Oral Daily   pantoprazole  40 mg Oral Daily   potassium chloride SA  40 mEq Oral Daily   pramipexole  1 mg Oral QID   selegiline  5 mg Oral BID WC   senna-docusate  2 tablet Oral BID   Continuous Infusions:  cefTRIAXone (ROCEPHIN)  IV 2 g (11/08/22 1021)   PRN Meds: acetaminophen **OR** acetaminophen, magnesium hydroxide, metolazone, morphine injection, ondansetron **OR** ondansetron (ZOFRAN) IV, polyethylene glycol, traZODone   Vital Signs    Vitals:   11/08/22 0500 11/08/22 0736 11/08/22 1547 11/08/22 2355  BP:  128/71 124/70 131/67  Pulse:  69 69 69  Resp:  '17 18 18  '$ Temp:  97.6 F (36.4 C) 98.2 F (36.8 C) 98 F (36.7 C)  TempSrc:  Oral    SpO2:  99% 97% 93%  Weight: 111 kg       Intake/Output Summary (Last 24 hours) at 11/09/2022 0802 Last data filed at 11/09/2022 0514 Gross per 24 hour  Intake --  Output 1750 ml  Net -1750 ml    Filed Weights   11/06/22 0500 11/07/22 0500 11/08/22 0500  Weight: 116 kg 115.7 kg 111 kg    Telemetry    V-paced with artifact - Personally Reviewed  ECG    No new tracings - Personally Reviewed  Physical Exam   GEN: No acute distress.   Neck: No JVD. Cardiac: RRR, no murmurs, rubs, or gallops.  Respiratory: Mildly diminished along the bilateral bases with poor inspiratory effort.  GI: Soft, nontender, non-distended.    MS: Trivial pretibial edema bilaterally; No deformity. Neuro:  Alert and oriented x 3; Nonfocal.  Psych: Normal affect.  Labs    Chemistry Recent Labs  Lab 11/04/22 2103 11/05/22 0506 11/07/22 0244 11/08/22 0935 11/09/22 0359  NA 138   < > 140 140 140  K 4.0   < > 4.0 3.8 4.2  CL 104   < > 104 100 107  CO2 23   < > '26 29 27  '$ GLUCOSE 186*   < > 104* 126* 100*  BUN 42*   < > 43* 44* 47*  CREATININE 2.22*   < > 1.83* 1.61* 1.63*  CALCIUM 8.8*   < > 8.8* 9.4 8.9  PROT 7.0  --   --   --   --   ALBUMIN 4.0  --   --   --   --   AST 30  --   --   --   --   ALT 18  --   --   --   --   ALKPHOS 66  --   --   --   --   BILITOT 1.5*  --   --   --   --  GFRNONAA 30*   < > 38* 44* 44*  ANIONGAP 11   < > '10 11 6   '$ < > = values in this interval not displayed.      Hematology Recent Labs  Lab 11/05/22 0506 11/06/22 0627 11/07/22 0244  WBC 15.9* 11.6* 8.8  RBC 4.22 4.10* 4.17*  HGB 13.0 12.4* 12.4*  HCT 40.4 39.0 39.2  MCV 95.7 95.1 94.0  MCH 30.8 30.2 29.7  MCHC 32.2 31.8 31.6  RDW 14.9 14.8 14.5  PLT 166 151 185     Cardiac EnzymesNo results for input(s): "TROPONINI" in the last 168 hours. No results for input(s): "TROPIPOC" in the last 168 hours.   BNP Recent Labs  Lab 11/04/22 2103  BNP 1,216.0*      DDimer No results for input(s): "DDIMER" in the last 168 hours.   Radiology        Cardiac Studies   2D echo 11/06/2022: 1. Cannot assess EF due to poor acoustical windows and patient refusal to  complete study. Left ventricular endocardial border not optimally defined  to evaluate regional wall motion. There is mild concentric left  ventricular hypertrophy. Left ventricular   diastolic function could not be evaluated.   2. Right ventricular systolic function is normal. The right ventricular  size is normal. Tricuspid regurgitation signal is inadequate for assessing  PA pressure.   3. The mitral valve is normal in structure. Trivial mitral valve   regurgitation. No evidence of mitral stenosis.   4. The aortic valve is tricuspid. Aortic valve regurgitation is mild.  Aortic valve sclerosis is present, with no evidence of aortic valve  stenosis. Aortic valve Vmax measures 1.07 m/s.   5. Aortic dilatation noted. There is mild dilatation of the aortic root,  measuring 38 mm   6. Rcommend repeat study with definity contrast.  __________  Limited echo 11/07/2022: 1. Left ventricular ejection fraction, by estimation, is 40 to 45%. The  left ventricle has mildly decreased function. Left ventricular endocardial  border not optimally defined to evaluate regional wall motion. There is  moderate left ventricular  hypertrophy. Left ventricular diastolic parameters are indeterminate.   2. Right ventricular systolic function is normal. The right ventricular  size is normal. Tricuspid regurgitation signal is inadequate for assessing  PA pressure.   3. Left atrial size was mildly dilated.   4. Right atrial size was mildly dilated.   5. The mitral valve is normal in structure. No evidence of mitral valve  regurgitation. No evidence of mitral stenosis.   6. The aortic valve is normal in structure. Aortic valve regurgitation is  mild. No aortic stenosis is present.   Patient Profile     77 y.o. male with history of CAD medically managed, persistent A-fib status post DCCV in 11/2016 and again in 02/2019, complete heart block status post PPM implantation in 11/2014, HFpEF, Parkinson's disease, HTN, and HLD who is being seen today for the evaluation of Streptococcus pneumonia bacteremia and cardiomyopathy at the request of Dr. Reesa Chew.   Assessment & Plan    Sepsis due to cellulitis and strep pneumo bacteremia:  -Patient will need TEE for further evaluation of bacterial endocarditis as well as for further imaging of pacemaker leads -NPO -TEE this afternoon with Dr. Garen Lah -EP to evaluate  Acute HFrEF:  -Uncertain chronicity and etiology, BNP  elevated at 1216  -Euvolemic and well compensated -He has completed 36 hours of ACEi washout -Change losartan to Entresto 24/26 mg bid -Continue PTA carvedilol  -  Reduced oral Lasix to 40 mg daily -With acute on CKD stage III, defer addition of MRA or SGLT2 inhibitor at this time -Given drop in EF, will need ischemic evaluation once improved from acute bacterial illness   Persistent A-fib:  -Currently V-paced  -Carvedilol  -With his new cardiomyopathy, antiarrhythmic therapy may need to be revisited as flecainide may not be the ideal medication for him, EP to evaluate  -CHA2DS2-VASc at least 5 (CHF, HTN, age x 2, vascular disease)  -Currently on PTA apixaban 5 mg twice daily (does not meet reduced dosing criteria)   History of complete heart block: -Status post PPM -EP to evaluate patient, particularly in light of bacteremia and case device extraction is indicated  Acute on CKD stage III:  -Renal function stable, reduce Lasix as above     Shared Decision Making/Informed Consent{   The risks [esophageal damage, perforation (1:10,000 risk), bleeding, pharyngeal hematoma as well as other potential complications associated with conscious sedation including aspiration, arrhythmia, respiratory failure and death], benefits (treatment guidance and diagnostic support) and alternatives of a transesophageal echocardiogram were discussed in detail with Mr. Gobert and he is willing to proceed.         For questions or updates, please contact [place] Please consult [email or web address] for contact info under Cardiology/STEMI.    Signed, Christell Faith, PA-C [facility] Pager: [phone] 11/09/2022, 8:02 AM  Patient seen and examined with the above-signed Advanced Practice Provider and/or Housestaff. I personally reviewed laboratory data, imaging studies and relevant notes. I independently examined the patient and formulated the important aspects of the plan. I have edited the note to  reflect any of my changes or salient points. I have personally discussed the plan with the patient and/or family.  Feeling better today. Up in chair. Still a bit week. Afebrile. TEE planned for today  General:  Weak appearing. No resp difficulty HEENT: normal Neck: supple. JVP 8-9. Carotids 2+ bilat; no bruits. No lymphadenopathy or thryomegaly appreciated. Cor: PMI nondisplaced. Regular rate & rhythm. No rubs, gallops or murmurs. Lungs: clear Abdomen: soft, nontender, nondistended. No hepatosplenomegaly. No bruits or masses. Good bowel sounds. Extremities: no cyanosis, clubbing, rash, tr-1+ edema + healed cellulitis Neuro: alert & orientedx3, cranial nerves grossly intact. moves all 4 extremities w/o difficulty. Affect pleasant  Improving. Still seems a bit volume up but exam difficult. Would change lasix to 60 daily  . Agree with switch to [facility]. Plan TEE to look for device vegetation. If EF truly < 50% may need to consider change in flecainide.   Glori Bickers, MD  10:59 AM

## 2022-11-09 NOTE — Progress Notes (Signed)
*  PRELIMINARY RESULTS* Echocardiogram Echocardiogram Transesophageal has been performed.  Brinton, Brandel 11/09/2022, 1:21 PM
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
## 2022-11-09 NOTE — TOC Initial Note (Signed)
Transition of Care [facility]) - Initial/Assessment Note    Patient Details  Name: Matthew Howe MRN: [id number] Date of Birth: Feb 21, 1946  [facility] CM/SW Contact:    Conception Oms, RN Phone Number: 11/09/2022, 10:43 AM  Clinical Narrative:  Met with the patient and his wife in the room to discuss DC Plan and needs He refuses to go to STR, is agreeable to [facility] PT only He has DME at home                 single point;Rolling Walker (2 wheels);Shower seat - built in;Grab bars - tub/shower   They do not have a preference of HH agencies [facility] accepted the patient He is to have a TEE today, TPC to continue to follow for additional needs  Expected Discharge Plan: [place] Barriers to Discharge: Continued Medical Work up   Patient Goals and CMS Choice            Expected Discharge Plan and Services   Discharge Planning Services: CM Consult   Living arrangements for the past 2 months: Single Family Home                 DME Arranged: N/A DME Agency: NA       HH Arranged: PT [place] Agency: [place] ([place]) Date [place]: 11/09/22 Time [place]: 52 Representative spoke with at [place]: Corene Cornea  Prior Living Arrangements/Services Living arrangements for the past 2 months: [place] with:: Spouse Patient language and need for interpreter reviewed:: Yes Do you feel safe going back to the place where you live?: Yes      Need for Family Participation in Patient Care: Yes (Comment) Care giver support system in place?: Yes (comment) Current home services: DME (single point;Rolling Walker (2 wheels);Shower seat - built in;Grab bars - tub/shower) Criminal Activity/Legal Involvement Pertinent to Current Situation/Hospitalization: No - Comment as needed  Activities of Daily Living Home Assistive Devices/Equipment: None ADL Screening (condition at time of admission) Patient's cognitive ability  adequate to safely complete daily activities?: Yes Is the patient deaf or have difficulty hearing?: No Does the patient have difficulty seeing, even when wearing glasses/contacts?: No Does the patient have difficulty concentrating, remembering, or making decisions?: Yes Patient able to express need for assistance with ADLs?: Yes Does the patient have difficulty dressing or bathing?: Yes Independently performs ADLs?: No Does the patient have difficulty walking or climbing stairs?: Yes Weakness of Legs: Both Weakness of Arms/Hands: None  Permission Sought/Granted   Permission granted to share information with : Yes, Verbal Permission Granted              Emotional Assessment Appearance:: Appears stated age Attitude/Demeanor/Rapport: Engaged Affect (typically observed): Pleasant Orientation: : Oriented to Self, Oriented to Place, Oriented to  Time, Oriented to Situation Alcohol / Substance Use: Not Applicable Psych Involvement: No (comment)  Admission diagnosis:  Cellulitis of right lower extremity [L03.115] Altered mental status, unspecified altered mental status type [R41.82] Sepsis due to cellulitis ([place]) [L03.90, A41.9] Sepsis with acute hypoxic respiratory failure without septic shock, due to unspecified organism ([place]) [A41.9, R65.20, J96.01] Patient Active Problem List   Diagnosis Date Noted   Bacteremia due to Streptococcus pneumoniae 11/08/2022   Cardiomyopathy ([place]) 11/08/2022   Sepsis due to cellulitis ([place]) 11/05/2022   GERD without esophagitis 11/05/2022   History of gout 11/05/2022   Peripheral neuropathy 11/05/2022   Stage III chronic kidney disease ([place])  11/05/2022   Cellulitis of right lower extremity 11/05/2022   Acute respiratory failure with hypoxia ([place]) 11/05/2022   Cardiac pacemaker in situ 04/18/2019   Persistent atrial fibrillation ([place]) 04/18/2019   Spondylolisthesis of lumbar region 08/06/2018   Bilateral carpal tunnel syndrome 07/22/2018   Back  pain 07/06/2018   Joint pain 03/29/2018   Morbid obesity ([place]) 09/20/2016   Knee pain, left 06/23/2016   Constipation 03/23/2016   Bradycardia 09/17/2015   Chronic diastolic heart failure ([place]) 08/11/2015   Acute on chronic diastolic CHF (congestive heart failure) (HCC)    Paroxysmal atrial fibrillation ([place])    Mitral valve regurgitation 06/01/2015   Fatigue 06/01/2015   Complete heart block ([place]) 11/21/2014   CAD (coronary artery disease) 04/18/2013   Murmur 03/15/2013   Abdominal discomfort 03/15/2013   Parkinson's disease ([place]) 03/15/2013   Essential hypertension 03/15/2013   Hyperlipidemia 03/15/2013   PCP:  Leonel Ramsay, MD Pharmacy:   [id number] [place] [facility] [address]: [phone]: [phone] [id number] [place] [place]: [phone]: [phone]   Social Determinants of Health (SDOH) Social History: SDOH Screenings   Food Insecurity: No Food Insecurity (10/26/2017)  Transportation Needs: No Transportation Needs (10/26/2017)  Financial Resource Strain: Low Risk  (10/26/2017)  Physical Activity: Insufficiently Active (10/26/2017)  Social Connections: Moderately Integrated (10/26/2017)  Stress: No Stress Concern Present (10/26/2017)  Tobacco Use: Low Risk  (08/01/2022)   SDOH Interventions:     Readmission Risk Interventions     No data to display

## 2022-11-09 NOTE — Progress Notes (Signed)
Matthew Howe for Infectious Disease  Date of Admission:  11/04/2022           Reason for visit: Follow up on bacteremia  Current antibiotics: Ceftriaxone  ASSESSMENT:    77 y.o. male admitted with:  Invasive strep pneumo bacteremia:  Etiology of bacteremia appears to be secondary to right leg cellulitis that is improved although this would be unusual etiology with strep pneumo.  However, there is no other localizing source noted on CT chest and CT abd/pelvis.  The erythema and swelling of his right leg is much improved following antibiotic therapy and bacteremia has been low grade with only 1 of 3 admission culture bottles positive and repeat cultures 11/07/22 NGTD.  TTE did not show vegetation, TEE pending for today 11/09/22. History of CHB: Status post CIED placement in 2016. Acute HFrEF: CHF service following.  Acute on CKD 3: Creatinine stable and overall improved. History of posterior fusion L4-5.  RECOMMENDATIONS:    Ceftriaxone 2 gm daily Follow up TEE today to determine antibiotic and CIED strategy Appreciate EP and CHF follow up Follow up blood cultures If TEE is negative and bacteremia remains low-grade, do not feel that device extraction is absolutely indicated at this time  Lab monitoring Will follow.    Principal Problem:   Sepsis due to cellulitis [facility]) Active Problems:   Essential hypertension   Acute on chronic diastolic CHF (congestive heart failure) (HCC)   Paroxysmal atrial fibrillation (HCC)   GERD without esophagitis   History of gout   Peripheral neuropathy   Stage III chronic kidney disease (HCC)   Acute respiratory failure with hypoxia (HCC)   Bacteremia due to Streptococcus pneumoniae   Cardiomyopathy ([place])    MEDICATIONS:    Scheduled Meds:  allopurinol  100 mg Oral Daily   apixaban  5 mg Oral BID   bisacodyl  5 mg Oral BID   carvedilol  3.125 mg Oral BID WC   flecainide  100 mg Oral BID   [START ON 11/10/2022] furosemide  60 mg  Oral Daily   magnesium oxide  400 mg Oral QHS   multivitamin with minerals  1 tablet Oral Daily   pantoprazole  40 mg Oral Daily   potassium chloride SA  40 mEq Oral Daily   pramipexole  1 mg Oral QID   sacubitril-valsartan  1 tablet Oral BID   selegiline  5 mg Oral BID WC   senna-docusate  2 tablet Oral BID   Continuous Infusions:  sodium chloride     cefTRIAXone (ROCEPHIN)  IV 2 g (11/08/22 1021)   PRN Meds:.acetaminophen **OR** acetaminophen, magnesium hydroxide, metolazone, morphine injection, ondansetron **OR** ondansetron (ZOFRAN) IV, polyethylene glycol, traZODone  SUBJECTIVE:   24 hour events:  Afebrile No acute events noted Creatinine stable Repeat blood cultures remain NGTD  Seen by EP and CHF team.  Waiting on TEE this morning.  Up in the chair and looking forward to eating.  Family member at bedside reports legs look the "best they have in a long time"  Review of Systems  All other systems reviewed and are negative.     OBJECTIVE:   Blood pressure 121/64, pulse 73, temperature (!) 97.4 F (36.3 C), resp. rate 18, weight 111 kg, SpO2 97 %. Body mass index is 33.19 kg/m.  Physical Exam Constitutional:      Comments: Elderly man, sitting up in the chair, NAD.   Eyes:     Extraocular Movements: Extraocular movements intact.  Conjunctiva/sclera: Conjunctivae normal.  Pulmonary:     Effort: Pulmonary effort is normal. No respiratory distress.  Abdominal:     General: There is no distension.     Palpations: Abdomen is soft.  Musculoskeletal:     Cervical back: Normal range of motion and neck supple.     Comments: Mild LE edema with stasis dermatitis.   Skin:    General: Skin is warm and dry.  Neurological:     General: No focal deficit present.     Mental Status: He is oriented to person, place, and time.  Psychiatric:        Mood and Affect: Mood normal.        Behavior: Behavior normal.      Lab Results: Lab Results  Component Value Date    WBC 8.8 11/07/2022   HGB 12.4 (L) 11/07/2022   HCT 39.2 11/07/2022   MCV 94.0 11/07/2022   PLT 185 11/07/2022    Lab Results  Component Value Date   NA 140 11/09/2022   K 4.2 11/09/2022   CO2 27 11/09/2022   GLUCOSE 100 (H) 11/09/2022   BUN 47 (H) 11/09/2022   CREATININE 1.63 (H) 11/09/2022   CALCIUM 8.9 11/09/2022   GFRNONAA 44 (L) 11/09/2022   GFRAA 43 (L) 04/28/2020    Lab Results  Component Value Date   ALT 18 11/04/2022   AST 30 11/04/2022   ALKPHOS 66 11/04/2022   BILITOT 1.5 (H) 11/04/2022    No results found for: "CRP"  No results found for: "ESRSEDRATE"   I have reviewed the micro and lab results in Epic.  Imaging: ECHOCARDIOGRAM LIMITED  Result Date: 11/07/2022    ECHOCARDIOGRAM LIMITED REPORT   Patient Name:   OLAOLUWA Howe Date of Exam: 11/07/2022 Medical Rec #:  [id number]     Height:       72.0 in Accession #:    [id number]    Weight:       255.1 lb Date of Birth:  June 25, 1946     BSA:          2.363 m Patient Age:    37 years      BP:           114/64 mmHg Patient Gender: M             HR:           70 bpm. Exam Location:  [facility] Procedure: Limited Echo, Cardiac Doppler, Color Doppler and Intracardiac            Opacification Agent Indications:     Abnormal ECG R94.31  History:         Patient has prior history of Echocardiogram examinations, most                  recent 11/06/2022. Pacemaker, Arrythmias:Atrial Fibrillation;                  Risk Factors:Hypertension and Dyslipidemia.  Sonographer:     Sherrie Sport Referring Phys:  [id number] [facility] AMIN Diagnosing Phys: Kathlyn [place] MD  Sonographer Comments: Suboptimal apical window. IMPRESSIONS  1. Left ventricular ejection fraction, by estimation, is 40 to 45%. The left ventricle has mildly decreased function. Left ventricular endocardial border not optimally defined to evaluate regional wall motion. There is moderate left ventricular hypertrophy. Left ventricular diastolic parameters are indeterminate.  2. Right  ventricular systolic function is normal. The right ventricular size is normal. Tricuspid regurgitation signal is inadequate for  assessing PA pressure.  3. Left atrial size was mildly dilated.  4. Right atrial size was mildly dilated.  5. The mitral valve is normal in structure. No evidence of mitral valve regurgitation. No evidence of mitral stenosis.  6. The aortic valve is normal in structure. Aortic valve regurgitation is mild. No aortic stenosis is present. FINDINGS  Left Ventricle: Left ventricular ejection fraction, by estimation, is 40 to 45%. The left ventricle has mildly decreased function. Left ventricular endocardial border not optimally defined to evaluate regional wall motion. Definity contrast agent was given IV to delineate the left ventricular endocardial borders. The left ventricular internal cavity size was normal in size. There is moderate left ventricular hypertrophy. Left ventricular diastolic parameters are indeterminate. Right Ventricle: The right ventricular size is normal. No increase in right ventricular wall thickness. Right ventricular systolic function is normal. Tricuspid regurgitation signal is inadequate for assessing PA pressure. Left Atrium: Left atrial size was mildly dilated. Right Atrium: Right atrial size was mildly dilated. Pericardium: There is no evidence of pericardial effusion. Mitral Valve: The mitral valve is normal in structure. No evidence of mitral valve stenosis. Tricuspid Valve: The tricuspid valve is normal in structure. Tricuspid valve regurgitation is not demonstrated. No evidence of tricuspid stenosis. Aortic Valve: The aortic valve is normal in structure. Aortic valve regurgitation is mild. No aortic stenosis is present. Pulmonic Valve: The pulmonic valve was normal in structure. Pulmonic valve regurgitation is not visualized. No evidence of pulmonic stenosis. Aorta: The aortic root is normal in size and structure. Venous: The inferior vena cava was not well  visualized. IAS/Shunts: No atrial level shunt detected by color flow Doppler. Additional Comments: A device lead is visualized. Spectral Doppler performed. Color Doppler performed.  LEFT VENTRICLE PLAX 2D LVIDd:         5.00 cm Diastology LVIDs:         4.30 cm LV e' lateral:   8.38 cm/s LV PW:         1.40 cm LV E/e' lateral: 8.9 LV IVS:        1.90 cm  RIGHT VENTRICLE RV S prime:     9.68 cm/s TAPSE (M-mode): 2.2 cm LEFT ATRIUM           Index        RIGHT ATRIUM           Index LA diam:      4.70 cm 1.99 cm/m   RA Area:     23.10 cm LA Vol (A2C): 66.4 ml 28.10 ml/m  RA Volume:   74.10 ml  31.36 ml/m LA Vol (A4C): 69.8 ml 29.54 ml/m   AORTA Ao Root diam: 2.80 cm MITRAL VALVE               TRICUSPID VALVE MV Area (PHT): 5.54 cm    TR Peak grad:   15.1 mmHg MV Decel Time: 137 msec    TR Vmax:        194.00 cm/s MV E velocity: 74.90 cm/s Kathlyn Sacramento MD Electronically signed by Kathlyn Sacramento MD Signature Date/Time: 11/07/2022/2:13:09 PM    Final      Imaging independently reviewed in Epic.    Raynelle Highland for Infectious Disease [facility] [phone] pager 11/09/2022, 11:09 AM

## 2022-11-09 NOTE — Procedures (Signed)
Transesophageal Echocardiogram :  Indication: bacteremia  Procedure: 10 ml of viscous lidocaine were given orally to provide local anesthesia to the oropharynx. The patient was positioned supine on the left side, bite block provided. The patient was moderately sedated with the doses of versed and fentanyl as detailed below.  Using digital technique an omniplane probe was advanced into the esophagus without incident.   Moderate sedation: 1. Sedation used:  Versed: 1.5 mg, Fentanyl: 25 mcg 2. Time administered: 12:45   Time when patient started recovery: 1:10 pm 3. I was face to face during this time 25 mins  See report in EPIC  for complete details: In brief, imaging revealed normal LV function with no RWMAs and no mural apical thrombus.  .  Estimated ejection fraction was 40-45%.  There is evidence for vegetation or endocarditis in the pacemaker lead.  Imaging of the septum showed no ASD or VSD 2D and color flow confirmed no PFO  The LA was well visualized in orthogonal views.  There was spontaneous contrast but no thrombus in the LA and LA appendage   The descending thoracic aorta had no  mural aortic debris with no evidence of aneurysmal dilation or dissection  Conclusion: Evidence of vegetation on pacemaker lead.   Matthew Howe 11/09/2022 1:22 PM

## 2022-11-09 NOTE — Care Management Important Message (Signed)
Important Message  Patient Details  Name: Matthew Howe MRN: [id number] Date of Birth: [birth year]   Medicare Important Message Given:  Yes     Dannette Barbara 11/09/2022, 11:04 AM

## 2022-11-09 NOTE — Plan of Care (Signed)

## 2022-11-09 NOTE — Evaluation (Signed)
Occupational Therapy Evaluation Patient Details Name: Matthew Howe MRN: [id number] DOB: [birth year] Today's Date: 11/09/2022   History of Present Illness Pt is a 77 y.o. male with medical history significant for osteoarthritis, atrial fibrillation, CHF, CKD, complete heart block status post PPM, GERD, hypertension, dyslipidemia, Parkinson's disease and coronary artery disease, who presented to the emergency room with acute onset of generalized weakness and fall. MD assessment includes: Sepsis due to cellulitis of the RLE, acute respiratory failure with hypoxia, and acute on chronic diastolic CHF.   Clinical Impression   Patient received for OT evaluation. See flowsheet below for details of function. Generally, patient requiring supervision for bed mobility, MIN A for functional mobility, and anticipated MIN A for ADLs. Patient will benefit from continued OT while in acute care.  Patient and wife declining [place] services at this time.     Recommendations for follow up therapy are one component of a multi-disciplinary discharge planning process, led by the attending physician.  Recommendations may be updated based on patient status, additional functional criteria and insurance authorization.   Follow Up Recommendations  No OT follow up (recommendation for HHOT, but pt/wife decline at this time)     Assistance Recommended at Discharge Frequent or constant Supervision/Assistance  Patient can return home with the following A lot of help with walking and/or transfers;A little help with bathing/dressing/bathroom;Assistance with cooking/housework;Direct supervision/assist for medications management;Direct supervision/assist for financial management;Assist for transportation;Help with stairs or ramp for entrance    Functional Status Assessment  Patient has had a recent decline in their functional status and demonstrates the ability to make significant improvements in function in a reasonable and  predictable amount of time.  Equipment Recommendations  None recommended by OT    Recommendations for Other Services       Precautions / Restrictions Precautions Precautions: Fall Precaution Comments: Patient has Parkinson's disease (has for over a decade) Restrictions Weight Bearing Restrictions: No      Mobility Bed Mobility Overal bed mobility: Needs Assistance Bed Mobility: Supine to Sit     Supine to sit: Supervision     General bed mobility comments: pt using bed rails; no physical assistance needed from OT today; extra time required. Pt wanting "space" while doing t/f.    Transfers Overall transfer level: Needs assistance Equipment used: Rolling walker (2 wheels) Transfers: Sit to/from Stand, Bed to chair/wheelchair/BSC Sit to Stand: Min assist, From elevated surface Stand pivot transfers: Min assist (with RW)         General transfer comment: Verbal cues; pt generally appearing quite anxious and irritated at OT's presence and close proximity during t/f; even with OT explanation of need to be close for safety; with encouragement, pt eventually able to stand MIN A. Pt needed constant verbal cues during small shuffling sidesteps with RW for RW management and foot coordination to t/f to recliner. Poor eccentric control on stand to sit t/f to recliner.      Balance Overall balance assessment: Needs assistance, History of Falls Sitting-balance support: Bilateral upper extremity supported, Feet supported Sitting balance-Leahy Scale: Fair     Standing balance support: Bilateral upper extremity supported, During functional activity Standing balance-Leahy Scale: Poor                             ADL either performed or assessed with clinical judgement   ADL Overall ADL's : Needs assistance/impaired  General ADL Comments: Pt declining all ADLs at this time; pt appearing to think he will be at baseline  with ADLs; unclear if this is true given decreased mobility noted during session today. Pt appearing anxious and fearful of falling. ADLs mostly limited by decreased mobility from baseline; anticipate wife will need to assist MIN A for most ADLs, including transfers. Once in seated position, pt more likely to be set up assist.     Vision         Perception     Praxis      Pertinent Vitals/Pain Pain Assessment Pain Assessment: No/denies pain     Hand Dominance Right   Extremity/Trunk Assessment Upper Extremity Assessment Upper Extremity Assessment: Overall WFL for tasks assessed;RUE deficits/detail RUE Deficits / Details: Pt reporting RUE pain in shoulder that is improving over the last few days; wife states that when he slid down the bed, his R arm remained on the bed and shoulder flexed a lot. Pt tolerates using RUE functionally during session; only pain with RUE flexion, but able to flex WNL AROM upon testing. Defer MMT.   Lower Extremity Assessment Lower Extremity Assessment: Generalized weakness;Defer to PT evaluation       Communication Communication Communication: No difficulties   Cognition Arousal/Alertness: Awake/alert Behavior During Therapy: Anxious (mildly irritated throughout session; mostly flat affect, with one instance of pt laughing)                                   General Comments: Pt is alert and mostly oriented; not explicitly tested; pt appearing irritated throughout session; intermittently pleasant with overall demeanor of being tired of being in the hospital. Pt also with intermittent irritation at wife as she tries to provide encouragement and guidance about course of tx (stating that pt will need to do PT at home, for example). Pt irritated when he states he's feeling rushed, as well as providing conflicting demands of OT (example: when about to stand, pt asks OT to step back because "I need space to lean forward"; when OT clarified that OT  needs to be close for safety and assistance, pt stating "give me your hand" instead of using bed/RW). Pt expressing frustration that he only yesterday was able to mobilize out of bed, but then when OT encouraged him to get up with OT assist to the chair, pt expressing irritation at this suggestion. Pt appearing to have difficulty understanding OT explanation of role of HHOT vs HHPT; wife appearing to be making most high-level decisions at this time such as about d/c planning.     General Comments  Lengthy discussion of role of HHOT (as pt/wife refusing PT's recommendation yesterday of SNF rehab). Pt appearing increasingly irritated as conversation continued and wife shared that pt would likely do better with fewer people in the home, so pt and wife elected to start with just HHPT and see how pt does; OT education on the fact that [place] could be added to the [facility] case later if it is determined that pt's ADL function is below baseline and unacceptable to pt/wife once home.    Exercises     Shoulder Instructions      Home Living Family/patient expects to be discharged to:: Private residence Living Arrangements: Spouse/significant other Available Help at Discharge: Family;Available 24 hours/day Type of Home: House Home Access: Stairs to enter [employer] of Steps: 3 Entrance Stairs-Rails: None Home Layout: Two  level;Able to live on main level with bedroom/bathroom Alternate Level Stairs-Number of Steps: 16   Bathroom Shower/Tub: Occupational psychologist: Handicapped height Bathroom Accessibility: Yes   Home Equipment: [place] - single Barista (2 wheels);Shower seat - built in;Grab bars - tub/shower   Additional Comments: Patient has a home gym upstairs; wife states he would go there about 1x/week; has not done that since shortly after Christmas.      Prior Functioning/Environment Prior Level of Function : Independent/Modified Independent              Mobility Comments: Mod (I) amb with a RW mostly household distances, no other falls other than fall associated with admission (which wife describes as "not really a fall, but sliding down the ege of the bed). Wife states pt has been using RW for approx 8 months. ADLs Comments: Mostly (I) with ADLs using RW. Wife states she helps don socks and pants at home, although states pt could do this task independently, but would need a lot of increased time. Pt is a retired Horticulturist, commercial. States he likes hunting and fishing, but has not done these activities in sevearl months. States his wife assists with most IADLs and medications. Pt does enjoy grilling, but has not done this in approx 1 year, per wife.        OT Problem List: Impaired balance (sitting and/or standing);Decreased activity tolerance      OT Treatment/Interventions: Self-care/ADL training;Therapeutic exercise;DME and/or AE instruction;Patient/family education;Therapeutic activities    OT Goals(Current goals can be found in the care plan section) Acute Rehab OT Goals Patient Stated Goal: Get out of this hospital and go home. OT Goal Formulation: With patient/family Time For Goal Achievement: 11/23/22 Potential to Achieve Goals: Good ADL Goals Pt Will Transfer to Toilet: with modified independence;regular height toilet;ambulating Pt Will Perform Tub/Shower Transfer: with modified independence;shower seat;ambulating;rolling walker  OT Frequency: Min 2X/week    Co-evaluation              AM-PAC OT "6 Clicks" Daily Activity     Outcome Measure Help from another person eating meals?: None Help from another person taking care of personal grooming?: None Help from another person toileting, which includes using toliet, bedpan, or urinal?: A Little Help from another person bathing (including washing, rinsing, drying)?: A Little Help from another person to put on and taking off regular upper body clothing?: None Help from another person  to put on and taking off regular lower body clothing?: A Little 6 Click Score: 21   End of Session Equipment Utilized During Treatment: Rolling walker (2 wheels) Nurse Communication: Mobility status  Activity Tolerance: Patient tolerated treatment well Patient left: in chair;with call bell/phone within reach;with chair alarm set;with family/visitor present  OT Visit Diagnosis: Unsteadiness on feet (R26.81)                Time: 2683-4196 OT Time Calculation (min): 27 min Charges:  OT General Charges $OT Visit: 1 Visit OT Evaluation $OT Eval Moderate Complexity: 1 Mod OT Treatments $Therapeutic Activity: 8-22 mins  Waymon Amato, MS, OTR/L  Vania Rea 11/09/2022, 12:42 PM

## 2022-11-10 ENCOUNTER — Encounter: Payer: Self-pay | Admitting: Cardiology

## 2022-11-10 DIAGNOSIS — R6520 Severe sepsis without septic shock: Secondary | ICD-10-CM | POA: Diagnosis not present

## 2022-11-10 DIAGNOSIS — B953 Streptococcus pneumoniae as the cause of diseases classified elsewhere: Secondary | ICD-10-CM | POA: Diagnosis not present

## 2022-11-10 DIAGNOSIS — L03115 Cellulitis of right lower limb: Secondary | ICD-10-CM | POA: Diagnosis not present

## 2022-11-10 DIAGNOSIS — A409 Streptococcal sepsis, unspecified: Secondary | ICD-10-CM | POA: Diagnosis not present

## 2022-11-10 DIAGNOSIS — R7881 Bacteremia: Secondary | ICD-10-CM | POA: Diagnosis not present

## 2022-11-10 DIAGNOSIS — J9601 Acute respiratory failure with hypoxia: Secondary | ICD-10-CM | POA: Diagnosis not present

## 2022-11-10 DIAGNOSIS — N1832 Chronic kidney disease, stage 3b: Secondary | ICD-10-CM | POA: Diagnosis not present

## 2022-11-10 DIAGNOSIS — R4182 Altered mental status, unspecified: Secondary | ICD-10-CM

## 2022-11-10 DIAGNOSIS — I420 Dilated cardiomyopathy: Secondary | ICD-10-CM

## 2022-11-10 DIAGNOSIS — A419 Sepsis, unspecified organism: Secondary | ICD-10-CM | POA: Diagnosis not present

## 2022-11-10 LAB — BASIC METABOLIC PANEL
Anion gap: 7 (ref 5–15)
BUN: 40 mg/dL — ABNORMAL HIGH (ref 8–23)
CO2: 28 mmol/L (ref 22–32)
Calcium: 8.9 mg/dL (ref 8.9–10.3)
Chloride: 107 mmol/L (ref 98–111)
Creatinine, Ser: 1.64 mg/dL — ABNORMAL HIGH (ref 0.61–1.24)
GFR, Estimated: 43 mL/min — ABNORMAL LOW (ref >60)
Glucose, Bld: 108 mg/dL — ABNORMAL HIGH (ref 70–99)
Potassium: 4.1 mmol/L (ref 3.5–5.1)
Sodium: 142 mmol/L (ref 135–145)

## 2022-11-10 LAB — CBC
HCT: 45.9 % (ref 39.0–52.0)
Hemoglobin: 14.6 g/dL (ref 13.0–17.0)
MCH: 29.9 pg (ref 26.0–34.0)
MCHC: 31.8 g/dL (ref 30.0–36.0)
MCV: 94.1 fL (ref 80.0–100.0)
Platelets: 261 10*3/uL (ref 150–400)
RBC: 4.88 MIL/uL (ref 4.22–5.81)
RDW: 14.3 % (ref 11.5–15.5)
WBC: 11.2 10*3/uL — ABNORMAL HIGH (ref 4.0–10.5)
nRBC: 0 % (ref 0.0–0.2)

## 2022-11-10 MED ORDER — FUROSEMIDE 20 MG PO TABS: 60.0000 mg | ORAL_TABLET | Freq: Every day | ORAL | 1 refills | Status: DC

## 2022-11-10 MED ORDER — SACUBITRIL-VALSARTAN 24-26 MG PO TABS: 1.0000 | ORAL_TABLET | Freq: Two times a day (BID) | ORAL | 1 refills | Status: DC

## 2022-11-10 MED ORDER — CEFTRIAXONE IV (FOR PTA / DISCHARGE USE ONLY): 2.0000 g | INTRAVENOUS | 0 refills | Status: AC

## 2022-11-10 NOTE — Plan of Care (Signed)
  Problem: Respiratory: Goal: Ability to maintain adequate ventilation will improve Outcome: Progressing   Problem: Fluid Volume: Goal: Hemodynamic stability will improve Outcome: Progressing   Problem: Clinical Measurements: Goal: Ability to maintain clinical measurements within normal limits will improve Outcome: Progressing   Problem: Health Behavior/Discharge Planning: Goal: Ability to manage health-related needs will improve Outcome: Progressing   Problem: Pain Managment: Goal: General experience of comfort will improve Outcome: Progressing   Problem: Safety: Goal: Ability to remain free from injury will improve Outcome: Progressing   Problem: Skin Integrity: Goal: Risk for impaired skin integrity will decrease Outcome: Progressing

## 2022-11-10 NOTE — TOC Progression Note (Signed)
Transition of Care [facility]) - Progression Note    Patient Details  Name: ERNAN RUNKLES MRN: [id number] Date of Birth: Feb 16, 1946  Transition of Care [facility]) CM/SW [place], RN Phone Number: 11/10/2022, 9:59 AM  Clinical Narrative:   [facility] is in place for IV infusion, pam is aware and is speaking to the patient's wife pat, [facility] will come to the hospital today for teaching [facility] is in place for [facility]     Expected Discharge Plan: [place] Barriers to Discharge: Continued Medical Work up  Expected Discharge Plan and Services   Discharge Planning Services: CM Consult   Living arrangements for the past 2 months: Single Family Home                 DME Arranged: N/A DME Agency: NA       HH Arranged: PT [place] Agency: [place] ([place]) Date HH Agency Contacted: 11/09/22 Time [place]: 55 Representative spoke with at [place]: [place] Determinants of Health ([place]) Interventions SDOH Screenings   Food Insecurity: No Food Insecurity (10/26/2017)  Transportation Needs: No Transportation Needs (10/26/2017)  Financial Resource Strain: Low Risk  (10/26/2017)  Physical Activity: Insufficiently Active (10/26/2017)  Social Connections: Moderately Integrated (10/26/2017)  Stress: No Stress Concern Present (10/26/2017)  Tobacco Use: Low Risk  (08/01/2022)    Readmission Risk Interventions     No data to display

## 2022-11-10 NOTE — Progress Notes (Signed)
Rounding Note    Patient Name: Matthew Howe Date of Encounter: 11/10/2022  [place] Cardiologist: Ida Rogue, MD   Subjective   Reports feeling well, denies significant abdominal distention or leg swelling Transesophageal echo yesterday, seen by EP Discussed results with Matthew Howe and family at the bedside Right leg cellulitis 1 out of 3 bottles positive strep pneumo, repeat cultures negative ID following, plan for several weeks of antibiotics, surveillance cultures No plan for lead extraction at this time given comorbidities including frail, Parkinson's, heart failure  Inpatient Medications    Scheduled Meds:  allopurinol  100 mg Oral Daily   apixaban  5 mg Oral BID   bisacodyl  5 mg Oral BID   carvedilol  3.125 mg Oral BID WC   furosemide  60 mg Oral Daily   magnesium oxide  400 mg Oral QHS   multivitamin with minerals  1 tablet Oral Daily   pantoprazole  40 mg Oral Daily   potassium chloride SA  40 mEq Oral Daily   pramipexole  1 mg Oral QID   sacubitril-valsartan  1 tablet Oral BID   selegiline  5 mg Oral BID WC   senna-docusate  2 tablet Oral BID   sodium chloride flush  10-40 mL Intracatheter Q12H   Continuous Infusions:  cefTRIAXone (ROCEPHIN)  IV 2 g (11/10/22 0940)   PRN Meds: acetaminophen **OR** acetaminophen, magnesium hydroxide, metolazone, ondansetron **OR** ondansetron (ZOFRAN) IV, polyethylene glycol, sodium chloride flush, traZODone   Vital Signs    Vitals:   11/09/22 2246 11/10/22 0458 11/10/22 0751 11/10/22 1153  BP: 126/72 122/64 138/67 (!) 114/56  Pulse: 69 69 70 70  Resp: '16 18 20 18  '$ Temp: 98 F (36.7 C) 98.1 F (36.7 C) 98.2 F (36.8 C) 98.2 F (36.8 C)  TempSrc:      SpO2: 99% 97% 98% 98%  Weight:        Intake/Output Summary (Last 24 hours) at 11/10/2022 1215 Last data filed at 11/10/2022 0501 Gross per 24 hour  Intake 546.67 ml  Output 1200 ml  Net -653.33 ml      11/08/2022    5:00 AM 11/07/2022    5:00 AM  11/06/2022    5:00 AM  Last 3 Weights  Weight (lbs) 244 lb 11.4 oz 255 lb 1.2 oz 255 lb 11.7 oz  Weight (kg) 111 kg 115.7 kg 116 kg      Telemetry    paced- Personally Reviewed  ECG     - Personally Reviewed  Physical Exam   GEN: No acute distress.   Neck: No JVD Cardiac: RRR, no murmurs, rubs, or gallops.  Respiratory: Clear to auscultation bilaterally. GI: Soft, nontender, non-distended  MS: No edema; No deformity. Neuro:  Nonfocal  Psych: Normal affect   Labs    High Sensitivity Troponin:  No results for input(s): "TROPONINIHS" in the last 720 hours.   Chemistry Recent Labs  Lab 11/04/22 2103 11/05/22 0506 11/08/22 0935 11/09/22 0359 11/10/22 0610  NA 138   < > 140 140 142  K 4.0   < > 3.8 4.2 4.1  CL 104   < > 100 107 107  CO2 23   < > '29 27 28  '$ GLUCOSE 186*   < > 126* 100* 108*  BUN 42*   < > 44* 47* 40*  CREATININE 2.22*   < > 1.61* 1.63* 1.64*  CALCIUM 8.8*   < > 9.4 8.9 8.9  PROT 7.0  --   --   --   --  ALBUMIN 4.0  --   --   --   --   AST 30  --   --   --   --   ALT 18  --   --   --   --   ALKPHOS 66  --   --   --   --   BILITOT 1.5*  --   --   --   --   GFRNONAA 30*   < > 44* 44* 43*  ANIONGAP 11   < > '11 6 7   '$ < > = values in this interval not displayed.    Lipids No results for input(s): "CHOL", "TRIG", "HDL", "LABVLDL", "LDLCALC", "CHOLHDL" in the last 168 hours.  Hematology Recent Labs  Lab 11/06/22 0627 11/07/22 0244 11/10/22 0610  WBC 11.6* 8.8 11.2*  RBC 4.10* 4.17* 4.88  HGB 12.4* 12.4* 14.6  HCT 39.0 39.2 45.9  MCV 95.1 94.0 94.1  MCH 30.2 29.7 29.9  MCHC 31.8 31.6 31.8  RDW 14.8 14.5 14.3  PLT 151 185 261   Thyroid No results for input(s): "TSH", "FREET4" in the last 168 hours.  BNP Recent Labs  Lab 11/04/22 2103  BNP 1,216.0*    DDimer No results for input(s): "DDIMER" in the last 168 hours.   Radiology    ECHO TEE  Result Date: 11/09/2022    TRANSESOPHOGEAL ECHO REPORT   Patient Name:   Matthew Howe Date of  Exam: 11/09/2022 Medical Rec #:  [id number]     Height:       72.0 in Accession #:    [id number]    Weight:       244.7 lb Date of Birth:  Apr 19, 1946     BSA:          2.321 m Patient Age:    17 years      BP:           110/63 mmHg Patient Gender: M             HR:           70 bpm. Exam Location:  [facility] Procedure: Transesophageal Echo, Color Doppler and Cardiac Doppler Indications:     Bacteremia R78.81  History:         Patient has prior history of Echocardiogram examinations, most                  recent 11/07/2022. CHF, Pacemaker, Arrythmias:Atrial                  Fibrillation; Risk Factors:Hypertension.  Sonographer:     Sherrie Sport Referring Phys:  [id number] [place] Diagnosing Phys: Kate Sable MD PROCEDURE: The transesophogeal probe was passed without difficulty through the esophogus of the patient. Sedation performed by performing physician. The patient developed no complications during the procedure.  IMPRESSIONS  1. Left ventricular ejection fraction, by estimation, is 40 to 45%. The left ventricle has mild to moderately decreased function.  2. 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 Right ventricular systolic function is normal. The right ventricular size is normal.  3. Left atrial size was mild to moderately dilated. No left atrial/left atrial appendage thrombus was detected.  4. The mitral valve is normal in structure. Trivial mitral valve regurgitation.  5. The aortic valve is tricuspid. Aortic valve regurgitation is mild to moderate. Conclusion(s)/Recommendation(s): Findings are concerning for vegetation/infective endocarditis as detailed above. FINDINGS  Left Ventricle: Left ventricular ejection  fraction, by estimation, is 40 to 45%. The left ventricle has mild to moderately decreased function. The left ventricular internal cavity size was normal in size. 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 The right ventricular size is normal. No increase in right ventricular wall thickness. Right ventricular systolic function is normal. Left Atrium: Left atrial size was mild to moderately dilated. Spontaneous echo contrast was present. No left atrial/left atrial appendage thrombus was detected. Right Atrium: Right atrial size was normal in size. Pericardium: There is no evidence of pericardial effusion. Mitral Valve: The mitral valve is normal in structure. Trivial mitral valve regurgitation. Tricuspid Valve: The tricuspid valve is normal in structure. Tricuspid valve regurgitation is not demonstrated. Aortic Valve: The aortic valve is tricuspid. Aortic valve regurgitation is mild to moderate. Pulmonic Valve: The pulmonic valve was normal in structure. Pulmonic valve regurgitation is not visualized. Aorta: The aortic root is normal in size and structure. IAS/Shunts: No atrial level shunt detected by color flow Doppler. Additional Comments: A device lead is visualized in the right atrium and right ventricle. Kate Sable MD Electronically signed by Kate Sable MD Signature Date/Time: 11/09/2022/1:43:38 PM    Final     Cardiac Studies   2D echo 11/06/2022: 1. Cannot assess EF due to poor acoustical windows and patient refusal to  complete study. Left ventricular endocardial border not optimally defined  to evaluate regional wall motion. There is mild concentric left  ventricular hypertrophy. Left ventricular   diastolic function could not be evaluated.   2. Right ventricular systolic function is normal. The right ventricular  size is normal. Tricuspid regurgitation signal is inadequate for assessing  PA pressure.   3. The mitral valve is normal in structure. Trivial mitral valve  regurgitation. No evidence of mitral stenosis.   4. The aortic valve is tricuspid. Aortic valve regurgitation is mild.  Aortic valve sclerosis is present, with no evidence of aortic valve   stenosis. Aortic valve Vmax measures 1.07 m/s.   5. Aortic dilatation noted. There is mild dilatation of the aortic root,  measuring 38 mm   6. Rcommend repeat study with definity contrast.  __________   Limited echo 11/07/2022: 1. Left ventricular ejection fraction, by estimation, is 40 to 45%. The  left ventricle has mildly decreased function. Left ventricular endocardial  border not optimally defined to evaluate regional wall motion. There is  moderate left ventricular  hypertrophy. Left ventricular diastolic parameters are indeterminate.   2. Right ventricular systolic function is normal. The right ventricular  size is normal. Tricuspid regurgitation signal is inadequate for assessing  PA pressure.   3. Left atrial size was mildly dilated.   4. Right atrial size was mildly dilated.   5. The mitral valve is normal in structure. No evidence of mitral valve  regurgitation. No evidence of mitral stenosis.   6. The aortic valve is normal in structure. Aortic valve regurgitation is  mild. No aortic stenosis is present.   Patient Profile     77 y.o. male with history of CAD medically managed, persistent A-fib status post DCCV in 11/2016 and again in 02/2019, complete heart block status post PPM implantation in 11/2014, HFpEF, Parkinson's disease, HTN, and HLD who is being seen today for the evaluation of Streptococcus pneumonia bacteremia and cardiomyopathy at the request of Dr. Reesa Chew.   Assessment & Plan    Sepsis in the setting of cellulitis, strep pneumo bacteremia ID following, TEE yesterday, plan for  several weeks of antibiotics, surveillance cultures as outpatient EP feels best decision would be not to proceed with lead extraction given comorbidities -Needs PICC line for IV antibiotics  Chronic systolic and diastolic CHF Transesophageal echo images reviewed, moderately depressed ejection fraction with global hypokinesis Will continue Entresto 24/26 mg twice daily, Coreg, Lasix 60  daily, metolazone as needed  Persistent atrial fibrillation Flecainide held in the setting of cardiomyopathy On Eliquis 5 twice daily Continue Coreg For recurrent symptomatic atrial fibrillation may need to consider amiodarone  History of complete heart block Status post pacemaker, followed by EP No immediate plan for lead extraction Surveillance cultures as outpatient   Total encounter time more than 50 minutes  Greater than 50% was spent in counseling and coordination of care with the patient  For questions or updates, please contact [place] Please consult [email or web address] for contact info under        Signed, Ida Rogue, MD  11/10/2022, 12:15 PM

## 2022-11-10 NOTE — Progress Notes (Signed)
Physical Therapy Treatment Patient Details Name: Matthew Howe MRN: [id number] DOB: 1945/12/19 Today's Date: 11/10/2022   History of Present Illness Pt is a 77 y.o. male with medical history significant for osteoarthritis, atrial fibrillation, CHF, CKD, complete heart block status post PPM, GERD, hypertension, dyslipidemia, Parkinson's disease and coronary artery disease, who presented to the emergency room with acute onset of generalized weakness and fall. MD assessment includes: Sepsis due to cellulitis of the RLE, acute respiratory failure with hypoxia, and acute on chronic diastolic CHF.    PT Comments    Pt was able to do much more with PT this session, still with baseline Parkinsonian gait, but able to ambulate >100 ft x 2 and negotiate up/down steps w/o direct assist.  Vitals we appropriate t/o the effort, wife present and feeling much better about being able to manage at home.  She reports that the new rails at the steps will be completed today.   Continue with PT per  POC.  Recommendations for follow up therapy are one component of a multi-disciplinary discharge planning process, led by the attending physician.  Recommendations may be updated based on patient status, additional functional criteria and insurance authorization.  Follow Up Recommendations  Home health PT Can patient physically be transported by private vehicle: No   Assistance Recommended at Discharge Frequent or constant Supervision/Assistance  Patient can return home with the following A lot of help with walking and/or transfers;A lot of help with bathing/dressing/bathroom;Assistance with cooking/housework;Direct supervision/assist for medications management;Assist for transportation;Help with stairs or ramp for entrance   Equipment Recommendations  None recommended by PT    Recommendations for Other Services       Precautions / Restrictions Precautions Precautions: Fall Precaution Comments: Patient has  Parkinson's disease (has for over a decade) Restrictions Weight Bearing Restrictions: No     Mobility  Bed Mobility               General bed mobility comments: in recliner pre/post session    Transfers Overall transfer level: Needs assistance Equipment used: Rolling walker (2 wheels) Transfers: Sit to/from Stand, Bed to chair/wheelchair/BSC Sit to Stand: From elevated surface, Min guard           General transfer comment: Pt was able to rise from recliner w/o direct assist multiple times t/o session.  Cuing to insure he was back to recliner before sitting.    Ambulation/Gait Ambulation/Gait assistance: Min guard Gait Distance (Feet): 175 Feet Assistive device: Rolling walker (2 wheels)         General Gait Details: Pt showed good effort with ambulation, consistent Parkinsonian gait (shuffling, knees bent - near baseline per wife).  Pt leaning on walker but able to ambulate 100 ft and then after rest break ~175 ft - vitals stable t/o.   Stairs Stairs: Yes Stairs assistance: Supervision Stair Management: One rail Right, Sideways Number of Stairs: 4 General stair comments: Pt with slow and deliberate effort, but able to get up/down steps w/o direct assist   Wheelchair Mobility    Modified Rankin (Stroke Patients Only)       Balance Overall balance assessment: Needs assistance, History of Falls Sitting-balance support: Bilateral upper extremity supported, Feet supported Sitting balance-Leahy Scale: Good     Standing balance support: Bilateral upper extremity supported, During functional activity Standing balance-Leahy Scale: Fair Standing balance comment: forward leaning on the walker, no LOBs but consistent low grade unsteadiness.  Cognition Arousal/Alertness: Awake/alert Behavior During Therapy: WFL for tasks assessed/performed                                            Exercises       General Comments General comments (skin integrity, edema, etc.): Pt was able to complete 2 longer bouts of ambulation and was also able to negotiate up/down steps.  Pt was adamant that he was going home and showed ability to safely do so after today's session.      Pertinent Vitals/Pain Pain Assessment Pain Assessment: No/denies pain    Home Living                          Prior Function            PT Goals (current goals can now be found in the care plan section) Progress towards PT goals: Progressing toward goals    Frequency    Min 2X/week      PT Plan Discharge plan needs to be updated    Co-evaluation              AM-PAC PT "6 Clicks" Mobility   Outcome Measure  Help needed turning from your back to your side while in a flat bed without using bedrails?: A Little Help needed moving from lying on your back to sitting on the side of a flat bed without using bedrails?: A Little Help needed moving to and from a bed to a chair (including a wheelchair)?: A Little Help needed standing up from a chair using your arms (e.g., wheelchair or bedside chair)?: A Little Help needed to walk in hospital room?: A Little Help needed climbing 3-5 steps with a railing? : A Little 6 Click Score: 18    End of Session Equipment Utilized During Treatment: Gait belt Activity Tolerance: Patient tolerated treatment well Patient left: in bed;with call bell/phone within reach;with bed alarm set;with family/visitor present Nurse Communication: Mobility status PT Visit Diagnosis: Unsteadiness on feet (R26.81);History of falling (Z91.81);Difficulty in walking, not elsewhere classified (R26.2);Muscle weakness (generalized) (M62.81)     Time: 0093-8182 PT Time Calculation (min) (ACUTE ONLY): 27 min  Charges:  $Gait Training: 23-37 mins                     Kreg Shropshire, DPT 11/10/2022, 10:39 AM

## 2022-11-10 NOTE — Discharge Summary (Signed)
Matthew Howe VOJ:[id number] DOB: September 10, 1946 DOA: 11/04/2022  PCP: Leonel Ramsay, MD  Admit date: 11/04/2022 Discharge date: 11/10/2022  Time spent: 35 minutes  Recommendations for Outpatient Follow-up:  PCP, ID, and cardiology f/u     Discharge Diagnoses:  Principal Problem:   Sepsis with acute hypoxic respiratory failure without septic shock ([place]) Active Problems:   Acute respiratory failure with hypoxia ([place])   Essential hypertension   Acute on chronic diastolic CHF (congestive heart failure) (HCC)   Paroxysmal atrial fibrillation (H[facility])   Stage III chronic kidney disease ([place])   Peripheral neuropathy   GERD without esophagitis   History of gout   Bacteremia due to Streptococcus pneumoniae   Cardiomyopathy ([place])   Bacteremia   Altered mental status   Discharge Condition: stable  Diet recommendation: heart healthy  Filed Weights   11/06/22 0500 11/07/22 0500 11/08/22 0500  Weight: 116 kg 115.7 kg 111 kg    History of present illness:  From admission h and p Matthew Howe is a 77 y.o. Caucasian male with medical history significant for osteoarthritis, atrial fibrillation, CHF, CKD, complete heart block status post PPM, GERD, hypertension, dyslipidemia, Parkinson's disease and coronary artery disease, who presented to the emergency room with acute onset of generalized weakness and fall while he was trying to get to the bathroom.  His wife stated he never lost consciousness.  She could not get him up.  She tried to use the help of the friend and still could not get him up.  When EMS arrived 3 ladies could not help getting him up and they had called the fire department.  The patient was noted to have a fever with a Tmax of 104.5 with no chills.  No chest pain or dyspnea or cough or wheezing.  No orthopnea or worsening lower extremity edema.  He was noted to have right leg erythema and warmth extending to his ankle.  No dysuria, oliguria or hematuria or flank pain.  The  patient's last 2D echo revealed an EF of 50 to 55% with indeterminate diastolic function on 06/19/3715.    Hospital Course:  Patient was admitted with cellulitis of the right lower extremity. One of 4 blood cultures positive for strep pneumonia. Patient was treated with ceftriaxone and cellulitis resolved. ID and Cardiology consulted. TEE showed possible vegetation in pacemaker lead but cardiology thinks this is most likely artifact and patient also a poor candidate for lead extraction. Plan is 2 weeks ceftriaxone (midline placed and home infusion services arranged) with outpatient ID f/u and plan for outpatient monitoring with serial blood cultures. Patient did show signs of volume overload and EF here is now reduced to 40-45%. Home flecainide was discontinued and lasix was substituted for home torsemide; entresto was also started. PT advised SNF but family declined this, opts instead for home health PT which was ordered.   Procedures: TEE   Consultations: ID, cardiology  Discharge Exam: Vitals:   11/10/22 0751 11/10/22 1153  BP: 138/67 (!) 114/56  Pulse: 70 70  Resp: 20 18  Temp: 98.2 F (36.8 C) 98.2 F (36.8 C)  SpO2: 98% 98%    General.  Overweight elderly man, in no acute distress. Pulmonary.  Lungs clear bilaterally, normal respiratory effort. CV.  Regular rate and rhythm, no JVD, rub or murmur. Abdomen.  Soft, nontender, nondistended, BS positive. CNS.  Alert and oriented .  No focal neurologic deficit. Extremities.  Trace LE edema, no cyanosis, pulses intact and symmetrical. Psychiatry.  Judgment  and insight appears normal.    Discharge Instructions   Discharge Instructions     [facility] pharmacist to adjust dose for Vancomycin, Aminoglycosides and other anti-infective therapies as requested by physician.   Complete by: As directed    [facility] to provide Cath Flo '2mg'$    Complete by: As directed    Administer for PICC line occlusion and as  ordered by physician for other access device issues.   Anaphylaxis Kit: Provided to treat any anaphylactic reaction to the medication being provided to the patient if First Dose or when requested by physician   Complete by: As directed    Epinephrine '1mg'$ /ml vial / amp: Administer 0.'3mg'$  (0.35m) subcutaneously once for moderate to severe anaphylaxis, nurse to call physician and pharmacy when reaction occurs and call 911 if needed for immediate care   Diphenhydramine '50mg'$ /ml IV vial: Administer 25-'50mg'$  IV/IM PRN for first dose reaction, rash, itching, mild reaction, nurse to call physician and pharmacy when reaction occurs   Sodium Chloride 0.9% NS 5066mIV: Administer if needed for hypovolemic blood pressure drop or as ordered by physician after call to physician with anaphylactic reaction   Change dressing on IV access line weekly and PRN   Complete by: As directed    Diet - low sodium heart healthy   Complete by: As directed    Flush IV access with Sodium Chloride 0.9% and Heparin 10 units/ml or 100 units/ml   Complete by: As directed    Home infusion instructions - [facility]   Complete by: As directed    Instructions: Flush IV access with Sodium Chloride 0.9% and Heparin 10units/ml or 100units/ml   Change dressing on IV access line: Weekly and PRN   Instructions Cath Flo '2mg'$ : Administer for PICC Line occlusion and as ordered by physician for other access device   [facility] pharmacist to adjust dose for: Vancomycin, Aminoglycosides and other anti-infective therapies as requested by physician   Increase activity slowly   Complete by: As directed    Method of administration may be changed at the discretion of home infusion pharmacist based upon assessment of the patient and/or caregiver's ability to self-administer the medication ordered   Complete by: As directed       Allergies as of 11/10/2022       Reactions   Linaclotide Shortness Of Breath, Other (See Comments)    Codeine Diarrhea, Nausea And Vomiting, Other (See Comments)   Oxycodone Nausea And Vomiting   Terazosin Other (See Comments)   Dizziness / altered mental state  Dizziness / altered mental state  Dizziness / altered mental state    Gabapentin Other (See Comments)   dizziness Other reaction(s): Other (See Comments) dizziness   Hydrocodone-acetaminophen Diarrhea, Nausea And Vomiting, Other (See Comments)   Lubiprostone Other (See Comments)   Abdominal cramps Other reaction(s): Other (See Comments) Abdominal cramps Abdominal cramps   Norco [hydrocodone-acetaminophen] Diarrhea, Nausea And Vomiting   Omeprazole Diarrhea, Nausea And Vomiting, Other (See Comments)   Stomach cramps   Ropinirole Nausea Only, Other (See Comments)        Medication List     STOP taking these medications    flecainide 100 MG tablet Commonly known as: TAMBOCOR   lisinopril 2.5 MG tablet Commonly known as: ZESTRIL   meloxicam 15 MG tablet Commonly known as: MOBIC   torsemide 20 MG tablet Commonly known as: DEMADEX       TAKE these medications    acetaminophen 500 MG  tablet Commonly known as: TYLENOL Take 500 mg by mouth every 6 (six) hours as needed (back pain.).   allopurinol 100 MG tablet Commonly known as: ZYLOPRIM Take 100 mg by mouth daily.   allopurinol 300 MG tablet Commonly known as: ZYLOPRIM Take 300 mg by mouth daily.   apixaban 5 MG Tabs tablet Commonly known as: Eliquis Take 1 tablet (5 mg total) by mouth 2 (two) times daily.   bisacodyl 5 MG EC tablet Commonly known as: DULCOLAX Take 5 mg by mouth 2 (two) times daily.   carboxymethylcellulose 0.5 % Soln Commonly known as: REFRESH PLUS Place 1 drop into both eyes at bedtime.   carvedilol 3.125 MG tablet Commonly known as: COREG Take 3.125 mg by mouth 2 (two) times daily.   cefTRIAXone  IVPB Commonly known as: ROCEPHIN Inject 2 g into the vein daily for 10 days. Indication:  S. Pneumoniae bacteremia First  Dose: Yes Last Day of Therapy:  11/21/2022 Labs - Once weekly:  CBC/D and CMP Fax weekly labs to [phone] Pull PICC at completion of antibiotics Method of administration: IV Push Method of administration may be changed at the discretion of home infusion pharmacist based upon assessment of the patient and/or caregiver's ability to self-administer the medication ordered. Start taking on: November 11, 2022   desloratadine 5 MG tablet Commonly known as: CLARINEX Take 5 mg by mouth at bedtime.   fluticasone 50 MCG/ACT nasal spray Commonly known as: FLONASE Place 2 sprays into the nose daily as needed for allergies.   furosemide 20 MG tablet Commonly known as: LASIX Take 3 tablets (60 mg total) by mouth daily. Start taking on: November 11, 2022   lansoprazole 15 MG capsule Commonly known as: PREVACID Take 15 mg by mouth daily as needed (indigestion).   magnesium oxide 400 MG tablet Commonly known as: MAG-OX Take 400 mg by mouth at bedtime.   metolazone 2.5 MG tablet Commonly known as: ZAROXOLYN TAKE 1 TABLET BY MOUTH DAILY AS NEEDED (ONCE DAILY AS NEEDED FOR WEIGHT GAIN OR SWELLING.).   OCUVITE ADULT 50+ PO Take 1 tablet by mouth daily.   oxybutynin 5 MG 24 hr tablet Commonly known as: DITROPAN-XL Take 5 mg by mouth at bedtime.   polyethylene glycol 17 g packet Commonly known as: MIRALAX / GLYCOLAX Take 17 g by mouth daily as needed (constipation.).   potassium chloride 10 MEQ tablet Commonly known as: KLOR-CON TAKE 4 TABLETS (40 MEQ TOTAL) BY MOUTH IN THE MORNING.   pramipexole 1 MG tablet Commonly known as: MIRAPEX Take 1 mg by mouth 4 (four) times daily.   pregabalin 25 MG capsule Commonly known as: LYRICA Take 25 mg by mouth every other day.   Red Yeast Rice 600 MG Caps Take 600 mg by mouth daily.   sacubitril-valsartan 24-26 MG Commonly known as: ENTRESTO Take 1 tablet by mouth 2 (two) times daily.   selegiline 5 MG capsule Commonly known as:  ELDEPRYL Take 5 mg by mouth 2 (two) times daily with a meal.   Senexon-S 8.6-50 MG tablet Generic drug: senna-docusate Take 2 tablets by mouth 2 (two) times daily.               Discharge Care Instructions  (From admission, onward)           Start     Ordered   11/10/22 0000  Change dressing on IV access line weekly and PRN  (Home infusion instructions - [facility] )  11/10/22 1524           Allergies  Allergen Reactions   Linaclotide Shortness Of Breath and Other (See Comments)   Codeine Diarrhea, Nausea And Vomiting and Other (See Comments)   Oxycodone Nausea And Vomiting   Terazosin Other (See Comments)    Dizziness / altered mental state  Dizziness / altered mental state  Dizziness / altered mental state    Gabapentin Other (See Comments)    dizziness Other reaction(s): Other (See Comments) dizziness   Hydrocodone-Acetaminophen Diarrhea, Nausea And Vomiting and Other (See Comments)   Lubiprostone Other (See Comments)    Abdominal cramps Other reaction(s): Other (See Comments) Abdominal cramps Abdominal cramps   Norco [Hydrocodone-Acetaminophen] Diarrhea and Nausea And Vomiting   Omeprazole Diarrhea, Nausea And Vomiting and Other (See Comments)    Stomach cramps   Ropinirole Nausea Only and Other (See Comments)    Follow-up Information     Leonel Ramsay, MD Follow up.   Specialty: Infectious Diseases Contact information: [place] [address] [phone]         Minna Merritts, MD Follow up.   Specialty: Cardiology Contact information: [place] [address] [phone]                  The results of significant diagnostics from this hospitalization (including imaging, microbiology, ancillary and laboratory) are listed below for reference.    Significant Diagnostic Studies: ECHO TEE  Result Date: 11/09/2022    TRANSESOPHOGEAL ECHO REPORT   Patient  Name:   Matthew Howe Date of Exam: 11/09/2022 Medical Rec #:  [id number]     Height:       72.0 in Accession #:    [id number]    Weight:       244.7 lb Date of Birth:  09/19/1946     BSA:          2.321 m Patient Age:    77 years      BP:           110/63 mmHg Patient Gender: M             HR:           70 bpm. Exam Location:  [facility] Procedure: Transesophageal Echo, Color Doppler and Cardiac Doppler Indications:     Bacteremia R78.81  History:         Patient has prior history of Echocardiogram examinations, most                  recent 11/07/2022. CHF, Pacemaker, Arrythmias:Atrial                  Fibrillation; Risk Factors:Hypertension.  Sonographer:     Sherrie Sport Referring Phys:  [id number] [place] Diagnosing Phys: Kate Sable MD PROCEDURE: The transesophogeal probe was passed without difficulty through the esophogus of the patient. Sedation performed by performing physician. The patient developed no complications during the procedure.  IMPRESSIONS  1. Left ventricular ejection fraction, by estimation, is 40 to 45%. The left ventricle has mild to moderately decreased function.  2. 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 Right ventricular systolic function is normal. The right ventricular size is normal.  3. Left atrial size was mild to moderately dilated. No left atrial/left atrial appendage thrombus was detected.  4. The mitral valve is normal in structure. Trivial mitral valve regurgitation.  5. The aortic  valve is tricuspid. Aortic valve regurgitation is mild to moderate. Conclusion(s)/Recommendation(s): Findings are concerning for vegetation/infective endocarditis as detailed above. FINDINGS  Left Ventricle: Left ventricular ejection fraction, by estimation, is 40 to 45%. The left ventricle has mild to moderately decreased function. The left ventricular internal cavity size was normal in size. Right Ventricle: There is evidence for  vegetation(endocarditis) attached to the RA pacemaker lead (image 26). The right ventricular size is normal. No increase in right ventricular wall thickness. Right ventricular systolic function is normal. Left Atrium: Left atrial size was mild to moderately dilated. Spontaneous echo contrast was present. No left atrial/left atrial appendage thrombus was detected. Right Atrium: Right atrial size was normal in size. Pericardium: There is no evidence of pericardial effusion. Mitral Valve: The mitral valve is normal in structure. Trivial mitral valve regurgitation. Tricuspid Valve: The tricuspid valve is normal in structure. Tricuspid valve regurgitation is not demonstrated. Aortic Valve: The aortic valve is tricuspid. Aortic valve regurgitation is mild to moderate. Pulmonic Valve: The pulmonic valve was normal in structure. Pulmonic valve regurgitation is not visualized. Aorta: The aortic root is normal in size and structure. IAS/Shunts: No atrial level shunt detected by color flow Doppler. Additional Comments: A device lead is visualized in the right atrium and right ventricle. Kate Sable MD Electronically signed by Kate Sable MD Signature Date/Time: 11/09/2022/1:43:38 PM    Final    ECHOCARDIOGRAM LIMITED  Result Date: 11/07/2022    ECHOCARDIOGRAM LIMITED REPORT   Patient Name:   Matthew Howe Date of Exam: 11/07/2022 Medical Rec #:  [id number]     Height:       72.0 in Accession #:    [id number]    Weight:       255.1 lb Date of Birth:  Jan 30, 1946     BSA:          2.363 m Patient Age:    34 years      BP:           114/64 mmHg Patient Gender: M             HR:           70 bpm. Exam Location:  [facility] Procedure: Limited Echo, Cardiac Doppler, Color Doppler and Intracardiac            Opacification Agent Indications:     Abnormal ECG R94.31  History:         Patient has prior history of Echocardiogram examinations, most                  recent 11/06/2022. Pacemaker, Arrythmias:Atrial Fibrillation;                   Risk Factors:Hypertension and Dyslipidemia.  Sonographer:     Sherrie Sport Referring Phys:  [id number] [facility] AMIN Diagnosing Phys: Kathlyn Sacramento MD  Sonographer Comments: Suboptimal apical window. IMPRESSIONS  1. Left ventricular ejection fraction, by estimation, is 40 to 45%. The left ventricle has mildly decreased function. Left ventricular endocardial border not optimally defined to evaluate regional wall motion. There is moderate left ventricular hypertrophy. Left ventricular diastolic parameters are indeterminate.  2. Right ventricular systolic function is normal. The right ventricular size is normal. Tricuspid regurgitation signal is inadequate for assessing PA pressure.  3. Left atrial size was mildly dilated.  4. Right atrial size was mildly dilated.  5. The mitral valve is normal in structure. No evidence of mitral valve regurgitation. No evidence of mitral stenosis.  6.  The aortic valve is normal in structure. Aortic valve regurgitation is mild. No aortic stenosis is present. FINDINGS  Left Ventricle: Left ventricular ejection fraction, by estimation, is 40 to 45%. The left ventricle has mildly decreased function. Left ventricular endocardial border not optimally defined to evaluate regional wall motion. Definity contrast agent was given IV to delineate the left ventricular endocardial borders. The left ventricular internal cavity size was normal in size. There is moderate left ventricular hypertrophy. Left ventricular diastolic parameters are indeterminate. Right Ventricle: The right ventricular size is normal. No increase in right ventricular wall thickness. Right ventricular systolic function is normal. Tricuspid regurgitation signal is inadequate for assessing PA pressure. Left Atrium: Left atrial size was mildly dilated. Right Atrium: Right atrial size was mildly dilated. Pericardium: There is no evidence of pericardial effusion. Mitral Valve: The mitral valve is normal in structure. No  evidence of mitral valve stenosis. Tricuspid Valve: The tricuspid valve is normal in structure. Tricuspid valve regurgitation is not demonstrated. No evidence of tricuspid stenosis. Aortic Valve: The aortic valve is normal in structure. Aortic valve regurgitation is mild. No aortic stenosis is present. Pulmonic Valve: The pulmonic valve was normal in structure. Pulmonic valve regurgitation is not visualized. No evidence of pulmonic stenosis. Aorta: The aortic root is normal in size and structure. Venous: The inferior vena cava was not well visualized. IAS/Shunts: No atrial level shunt detected by color flow Doppler. Additional Comments: A device lead is visualized. Spectral Doppler performed. Color Doppler performed.  LEFT VENTRICLE PLAX 2D LVIDd:         5.00 cm Diastology LVIDs:         4.30 cm LV e' lateral:   8.38 cm/s LV PW:         1.40 cm LV E/e' lateral: 8.9 LV IVS:        1.90 cm  RIGHT VENTRICLE RV S prime:     9.68 cm/s TAPSE (M-mode): 2.2 cm LEFT ATRIUM           Index        RIGHT ATRIUM           Index LA diam:      4.70 cm 1.99 cm/m   RA Area:     23.10 cm LA Vol (A2C): 66.4 ml 28.10 ml/m  RA Volume:   74.10 ml  31.36 ml/m LA Vol (A4C): 69.8 ml 29.54 ml/m   AORTA Ao Root diam: 2.80 cm MITRAL VALVE               TRICUSPID VALVE MV Area (PHT): 5.54 cm    TR Peak grad:   15.1 mmHg MV Decel Time: 137 msec    TR Vmax:        194.00 cm/s MV E velocity: 74.90 cm/s Kathlyn Sacramento MD Electronically signed by Kathlyn Sacramento MD Signature Date/Time: 11/07/2022/2:13:09 PM    Final    ECHOCARDIOGRAM COMPLETE  Result Date: 11/06/2022    ECHOCARDIOGRAM REPORT   Patient Name:   Matthew Howe Date of Exam: 11/06/2022 Medical Rec #:  [id number]     Height:       72.0 in Accession #:    [id number]    Weight:       255.7 lb Date of Birth:  06/25/1946     BSA:          2.365 m Patient Age:    27 years      BP:  106/63 mmHg Patient Gender: M             HR:           70 bpm. Exam Location:  [facility] Procedure:  2D Echo Indications:     CHF I50.31  History:         Patient has prior history of Echocardiogram examinations, most                  recent 03/24/2019.  Sonographer:     Kathlen Brunswick RDCS Referring Phys:  [id number] RAVI PAHWANI Diagnosing Phys: Fransico Him MD  Sonographer Comments: Technically difficult study due to poor echo windows. Image acquisition challenging due to patient body habitus and Image acquisition challenging due to uncooperative patient. Unable to complete echo due to patient's request. The patient declined Definity ultrasound imaging agent. IMPRESSIONS  1. Cannot assess EF due to poor acoustical windows and patient refusal to complete study. Left ventricular endocardial border not optimally defined to evaluate regional wall motion. There is mild concentric left ventricular hypertrophy. Left ventricular  diastolic function could not be evaluated.  2. Right ventricular systolic function is normal. The right ventricular size is normal. Tricuspid regurgitation signal is inadequate for assessing PA pressure.  3. The mitral valve is normal in structure. Trivial mitral valve regurgitation. No evidence of mitral stenosis.  4. The aortic valve is tricuspid. Aortic valve regurgitation is mild. Aortic valve sclerosis is present, with no evidence of aortic valve stenosis. Aortic valve Vmax measures 1.07 m/s.  5. Aortic dilatation noted. There is mild dilatation of the aortic root, measuring 38 mm  6. Rcommend repeat study with definity contrast. FINDINGS  Left Ventricle: Left ventricular ejection fraction, by estimation, is Cannot assess EF due to poor acoustical windows and patient refusal to complete study.%. The left ventricle has normal function. Left ventricular endocardial border not optimally defined to evaluate regional wall motion. The left ventricular internal cavity size was normal in size. There is mild concentric left ventricular hypertrophy. Abnormal (paradoxical) septal motion, consistent  with left bundle branch block. Left ventricular diastolic function could not be evaluated. Right Ventricle: The right ventricular size is normal. No increase in right ventricular wall thickness. Right ventricular systolic function is normal. Tricuspid regurgitation signal is inadequate for assessing PA pressure. Left Atrium: Left atrial size was normal in size. Right Atrium: Right atrial size was normal in size. Pericardium: There is no evidence of pericardial effusion. Mitral Valve: The mitral valve is normal in structure. Trivial mitral valve regurgitation. No evidence of mitral valve stenosis. Tricuspid Valve: The tricuspid valve is normal in structure. Tricuspid valve regurgitation is not demonstrated. No evidence of tricuspid stenosis. Aortic Valve: The aortic valve is tricuspid. Aortic valve regurgitation is mild. Aortic valve sclerosis is present, with no evidence of aortic valve stenosis. Aortic valve peak gradient measures 4.6 mmHg. Pulmonic Valve: The pulmonic valve was normal in structure. Pulmonic valve regurgitation is not visualized. No evidence of pulmonic stenosis. Aorta: Aortic dilatation noted. There is mild dilatation of the aortic root, measuring 38 mm. Venous: The inferior vena cava was not well visualized. IAS/Shunts: No atrial level shunt detected by color flow Doppler.  LEFT VENTRICLE PLAX 2D LVIDd:         5.70 cm LVIDs:         4.00 cm LV PW:         1.30 cm LV IVS:        1.30 cm LVOT diam:     2.40  cm LVOT Area:     4.52 cm  LEFT ATRIUM           Index LA diam:      5.20 cm 2.20 cm/m LA Vol (A4C): 50.5 ml 21.35 ml/m  AORTIC VALVE              PULMONIC VALVE AV Vmax:      107.00 cm/s PV Vmax:       0.82 m/s AV Peak Grad: 4.6 mmHg    PV Peak grad:  2.7 mmHg  AORTA Ao Root diam: 3.80 cm Ao Asc diam:  3.40 cm MITRAL VALVE MV Area (PHT): 4.97 cm    SHUNTS MV Decel Time: 153 msec    Systemic Diam: 2.40 cm MV E velocity: 34.90 cm/s MV A velocity: 29.00 cm/s MV E/A ratio:  1.20 Fransico Him  MD Electronically signed by Fransico Him MD Signature Date/Time: 11/06/2022/5:39:31 PM    Final    CT CHEST WO CONTRAST  Result Date: 11/04/2022 CLINICAL DATA:  Pneumonia, complication is suspected. EXAM: CT CHEST WITHOUT CONTRAST TECHNIQUE: Multidetector CT imaging of the chest was performed following the standard protocol without IV contrast. RADIATION DOSE REDUCTION: This exam was performed according to the departmental dose-optimization program which includes automated exposure control, adjustment of the mA and/or kV according to patient size and/or use of iterative reconstruction technique. COMPARISON:  Portable chest today, chest radiograph 02/01/2016, and CTA chest 05/07/2020. FINDINGS: Cardiovascular: There is moderate cardiomegaly with a left chamber predominance, increased since 2021. Spray artifact again noted due to a left chest implanted pacemaker and wiring in the right atrium and ventricle. There are patchy calcifications in the LAD coronary artery. No pericardial effusion is seen. Pulmonary trunk is ectatic measuring 3.7 cm indicating arterial hypertension, previously 3.2 cm. There are mild aortic calcific plaques, descending tortuosity. Normal great vessel branching with trace calcification distal innominate artery. There is slight dilatation of the aortic root at the sinuses of Valsalva measuring 4 cm, slightly dilated ascending aortic segment measuring 4.1 cm, previously 3.9 cm. The arch segment measures 3.4 cm. The descending segment measures 3.3 cm. Central pulmonary veins are mildly prominent as well. Mediastinum/Nodes: No intrathoracic or axillary adenopathy is seen without contrast. Limited view of the hila. The thyroid gland, thoracic trachea, thoracic esophagus are unremarkable. Lungs/Pleura: No pleural effusion, thickening or pneumothorax. No pulmonary edema is seen. Respiratory motion limits evaluation of the lungs. Posterior atelectasis again is noted in the lower lobes. There is mild  stable subpleural reticulation in the bases. No infiltrate or nodule is seen. The main bronchi are clear. Upper Abdomen: No acute abnormality. Stable subcentimeter hypodensity in the left lobe of the liver most likely due to assist. Stable mild nodular thickening of the adrenal glands. Musculoskeletal: Extensive multilevel thoracic spine bridging enthesopathy of DISH. No acute or other significant osseous findings. The ribcage is intact. IMPRESSION: 1. No evidence of pneumonia or pulmonary edema. 2. Moderate cardiomegaly with left chamber predominance, mildly prominent central pulmonary veins, and a prominent pulmonary trunk indicating arterial hypertension. 3. Aortic atherosclerosis with slight dilatation of the aortic root and ascending segment. Follow-up CTA or MRA recommended in 12 months. This recommendation follows 2010 ACCF/AHA/AATS/ACR/ASA/SCA/SCAI/SIR/STS/SVM Guidelines for the Diagnosis and Management of Patients with Thoracic Aortic Disease. Circulation. 2010; 121: U725-D664. Aortic aneurysm NOS (ICD10-I71.9). 4. Chronic subpleural reticulation in the lung bases. 5. Extensive multilevel thoracic spine bridging enthesopathy of DISH. Aortic Atherosclerosis (ICD10-I70.0). Electronically Signed   By: Telford Nab M.D.   On: 11/04/2022  23:33   CT Renal Stone Study  Result Date: 11/04/2022 CLINICAL DATA:  Altered mental status after double dose of Parkinson's medication. Abdominal/flank pain. Stones suspected. EXAM: CT ABDOMEN AND PELVIS WITHOUT CONTRAST TECHNIQUE: Multidetector CT imaging of the abdomen and pelvis was performed following the standard protocol without IV contrast. RADIATION DOSE REDUCTION: This exam was performed according to the departmental dose-optimization program which includes automated exposure control, adjustment of the mA and/or kV according to patient size and/or use of iterative reconstruction technique. COMPARISON:  CT 12/29/2014 FINDINGS: Lower chest: Cardiomegaly. Partially  visualized ICD. Respiratory motion obscures the lung bases. Hepatobiliary: No solid hepatic lesion. Unremarkable gallbladder. No biliary dilation. Pancreas: Unremarkable. Spleen: Unremarkable. Adrenals/Urinary Tract: Normal adrenal glands. Low-attenuation lesions in the kidneys are statistically likely to represent cysts. No follow-up is required. No urinary calculi or hydronephrosis. Unremarkable bladder. Stomach/Bowel: Normal caliber large and small bowel. Colonic diverticulosis without diverticulitis. The appendix is not definitively visualized. Unremarkable stomach. Vascular/Lymphatic: Prominent borderline enlarged right inguinal lymph nodes measuring up to 1.2 cm in short axis (3/85). Aortic atherosclerotic calcification. Reproductive: Unremarkable. Other: Nonspecific mild stranding in the anterior right thigh. Musculoskeletal: Thoracolumbar spondylosis. Posterior fusion L4-L5. No acute fracture. IMPRESSION: No acute abnormality in the abdomen or pelvis. Mild nonspecific subcutaneous stranding about the anterior right thigh is nonspecific and could be due to edema, contusion or infection. Borderline enlarged right inguinal lymph nodes, favored reactive. Electronically Signed   By: Placido Sou M.D.   On: 11/04/2022 23:22   CT HEAD WO CONTRAST (5MM)  Result Date: 11/04/2022 CLINICAL DATA:  Altered mental status, accidental overdose of Parkinson's medication EXAM: CT HEAD WITHOUT CONTRAST TECHNIQUE: Contiguous axial images were obtained from the base of the skull through the vertex without intravenous contrast. RADIATION DOSE REDUCTION: This exam was performed according to the departmental dose-optimization program which includes automated exposure control, adjustment of the mA and/or kV according to patient size and/or use of iterative reconstruction technique. COMPARISON:  11/02/2012 FINDINGS: Motion degraded images. Brain: No evidence of acute infarction, hemorrhage, hydrocephalus, extra-axial  collection or mass lesion/mass effect. Vascular: No hyperdense vessel or unexpected calcification. Skull: Normal. Negative for fracture or focal lesion. Sinuses/Orbits: The visualized paranasal sinuses are essentially clear. The mastoid air cells are unopacified. Other: None. IMPRESSION: Motion degraded images. No evidence of acute intracranial abnormality. Electronically Signed   By: Julian Hy M.D.   On: 11/04/2022 23:13   DG Chest Port 1 View  Result Date: 11/04/2022 CLINICAL DATA:  Hypoxia, sepsis. EXAM: PORTABLE CHEST 1 VIEW COMPARISON:  February 01, 2016. FINDINGS: Mild cardiomegaly is noted with central pulmonary vascular congestion. Mild right basilar atelectasis or infiltrate is noted. Left-sided pacemaker is unchanged. Bony thorax is unremarkable. IMPRESSION: Mild cardiomegaly with mild central pulmonary vascular congestion. Right basilar atelectasis or infiltrate is noted. Followup PA and lateral chest X-ray is recommended in 3-4 weeks following trial of antibiotic therapy to ensure resolution and exclude underlying malignancy. Electronically Signed   By: Marijo Conception M.D.   On: 11/04/2022 21:32   CUP PACEART REMOTE DEVICE CHECK  Result Date: 11/01/2022 Scheduled remote reviewed. Normal device function.  Next remote 91 days- JJB   Microbiology: Recent Results (from the past 240 hour(s))  Resp panel by RT-PCR (RSV, Flu A&B, Covid) Anterior Nasal Swab     Status: None   Collection Time: 11/04/22  9:03 PM   Specimen: Anterior Nasal Swab  Result Value Ref Range Status   SARS Coronavirus 2 by RT PCR NEGATIVE NEGATIVE Final  Comment: (NOTE) SARS-CoV-2 target nucleic acids are NOT DETECTED.  The SARS-CoV-2 RNA is generally detectable in upper respiratory specimens during the acute phase of infection. The lowest concentration of SARS-CoV-2 viral copies this assay can detect is 138 copies/mL. A negative result does not preclude SARS-Cov-2 infection and should not be used as the  sole basis for treatment or other patient management decisions. A negative result may occur with  improper specimen collection/handling, submission of specimen other than nasopharyngeal swab, presence of viral mutation(s) within the areas targeted by this assay, and inadequate number of viral copies(<138 copies/mL). A negative result must be combined with clinical observations, patient history, and epidemiological information. The expected result is Negative.  Fact Sheet for Patients:  EntrepreneurPulse.com.au  Fact Sheet for Healthcare Providers:  IncredibleEmployment.be  This test is no t yet approved or cleared by the [place] FDA and  has been authorized for detection and/or diagnosis of SARS-CoV-2 by FDA under an Emergency Use Authorization (EUA). This EUA will remain  in effect (meaning this test can be used) for the duration of the COVID-19 declaration under Section 564(b)(1) of the Act, 21 U.S.C.section 360bbb-3(b)(1), unless the authorization is terminated  or revoked sooner.       Influenza A by PCR NEGATIVE NEGATIVE Final   Influenza B by PCR NEGATIVE NEGATIVE Final    Comment: (NOTE) The Xpert Xpress SARS-CoV-2/FLU/RSV plus assay is intended as an aid in the diagnosis of influenza from Nasopharyngeal swab specimens and should not be used as a sole basis for treatment. Nasal washings and aspirates are unacceptable for Xpert Xpress SARS-CoV-2/FLU/RSV testing.  Fact Sheet for Patients: EntrepreneurPulse.com.au  Fact Sheet for Healthcare Providers: IncredibleEmployment.be  This test is not yet approved or cleared by the [place] FDA and has been authorized for detection and/or diagnosis of SARS-CoV-2 by FDA under an Emergency Use Authorization (EUA). This EUA will remain in effect (meaning this test can be used) for the duration of the COVID-19 declaration under Section 564(b)(1) of the  Act, 21 U.S.C. section 360bbb-3(b)(1), unless the authorization is terminated or revoked.     Resp Syncytial Virus by PCR NEGATIVE NEGATIVE Final    Comment: (NOTE) Fact Sheet for Patients: EntrepreneurPulse.com.au  Fact Sheet for Healthcare Providers: IncredibleEmployment.be  This test is not yet approved or cleared by the [place] FDA and has been authorized for detection and/or diagnosis of SARS-CoV-2 by FDA under an Emergency Use Authorization (EUA). This EUA will remain in effect (meaning this test can be used) for the duration of the COVID-19 declaration under Section 564(b)(1) of the Act, 21 U.S.C. section 360bbb-3(b)(1), unless the authorization is terminated or revoked.  Performed at [facility] [address] [place], [place] [address]   Blood Culture (routine x 2)     Status: None   Collection Time: 11/04/22  9:03 PM   Specimen: BLOOD  Result Value Ref Range Status   Specimen Description BLOOD BLOOD RIGHT FOREARM  Final   Special Requests   Final    BOTTLES DRAWN AEROBIC AND ANAEROBIC Blood Culture adequate volume   Culture   Final    NO GROWTH 5 DAYS Performed at [facility], [address] [place], [place] [address]    Report Status 11/09/2022 FINAL  Final  Blood Culture (routine x 2)     Status: Abnormal   Collection Time: 11/04/22  9:03 PM   Specimen: BLOOD LEFT HAND  Result Value Ref Range Status   Specimen Description   Final  BLOOD LEFT HAND Performed at [facility], [place] [address], [place], [place] [address]    Special Requests   Final    BOTTLES DRAWN AEROBIC ONLY Blood Culture adequate volume Performed at [facility], [address] [place], [place] [address]    Culture  Setup Time   Final    AEROBIC BOTTLE ONLY GRAM POSITIVE COCCI CRITICAL RESULT CALLED TO, READ BACK BY AND VERIFIED WITH: Nilsa Nutting '@1121'$  11/05/22 MJU Performed at [facility],  [place] [address] [place], [place] [address]    Culture STREPTOCOCCUS PNEUMONIAE (A)  Final   Report Status 11/07/2022 FINAL  Final   Organism ID, Bacteria STREPTOCOCCUS PNEUMONIAE  Final      Susceptibility   Streptococcus pneumoniae - MIC*    ERYTHROMYCIN >=8 RESISTANT Resistant     LEVOFLOXACIN 0.5 SENSITIVE Sensitive     VANCOMYCIN 0.5 SENSITIVE Sensitive     PENO - penicillin 1      PENICILLIN (non-meningitis) 1 SENSITIVE Sensitive     PENICILLIN (oral) 1 INTERMEDIATE Intermediate     CEFTRIAXONE (non-meningitis) 1 SENSITIVE Sensitive     * STREPTOCOCCUS PNEUMONIAE  Blood Culture ID Panel (Reflexed)     Status: Abnormal   Collection Time: 11/04/22  9:03 PM  Result Value Ref Range Status   Enterococcus faecalis NOT DETECTED NOT DETECTED Final   Enterococcus Faecium NOT DETECTED NOT DETECTED Final   Listeria monocytogenes NOT DETECTED NOT DETECTED Final   Staphylococcus species NOT DETECTED NOT DETECTED Final   Staphylococcus aureus (BCID) NOT DETECTED NOT DETECTED Final   Staphylococcus epidermidis NOT DETECTED NOT DETECTED Final   Staphylococcus lugdunensis NOT DETECTED NOT DETECTED Final   Streptococcus species DETECTED (A) NOT DETECTED Final    Comment: CRITICAL RESULT CALLED TO, READ BACK BY AND VERIFIED WITH: ANDERSON MOORE '@1121'$  11/05/22 MJU    Streptococcus agalactiae NOT DETECTED NOT DETECTED Final   Streptococcus pneumoniae DETECTED (A) NOT DETECTED Final    Comment: CRITICAL RESULT CALLED TO, READ BACK BY AND VERIFIED WITH: ANDERSON MOORE '@1121'$  11/05/22 MJU    Streptococcus pyogenes NOT DETECTED NOT DETECTED Final   A.calcoaceticus-baumannii NOT DETECTED NOT DETECTED Final   Bacteroides fragilis NOT DETECTED NOT DETECTED Final   Enterobacterales NOT DETECTED NOT DETECTED Final   Enterobacter cloacae complex NOT DETECTED NOT DETECTED Final   Escherichia coli NOT DETECTED NOT DETECTED Final   Klebsiella aerogenes NOT DETECTED NOT DETECTED Final   Klebsiella oxytoca NOT  DETECTED NOT DETECTED Final   Klebsiella pneumoniae NOT DETECTED NOT DETECTED Final   Proteus species NOT DETECTED NOT DETECTED Final   Salmonella species NOT DETECTED NOT DETECTED Final   Serratia marcescens NOT DETECTED NOT DETECTED Final   Haemophilus influenzae NOT DETECTED NOT DETECTED Final   Neisseria meningitidis NOT DETECTED NOT DETECTED Final   Pseudomonas aeruginosa NOT DETECTED NOT DETECTED Final   Stenotrophomonas maltophilia NOT DETECTED NOT DETECTED Final   Candida albicans NOT DETECTED NOT DETECTED Final   Candida auris NOT DETECTED NOT DETECTED Final   Candida glabrata NOT DETECTED NOT DETECTED Final   Candida krusei NOT DETECTED NOT DETECTED Final   Candida parapsilosis NOT DETECTED NOT DETECTED Final   Candida tropicalis NOT DETECTED NOT DETECTED Final   Cryptococcus neoformans/gattii NOT DETECTED NOT DETECTED Final    Comment: Performed at [facility], [address] [place],  [address]  Culture, blood (Routine X 2) w Reflex to ID Panel     Status: None (Preliminary result)  Collection Time: 11/07/22  9:08 AM   Specimen: BLOOD  Result Value Ref Range Status   Specimen Description BLOOD BLOOD RIGHT HAND  Final   Special Requests   Final    BOTTLES DRAWN AEROBIC AND ANAEROBIC Blood Culture adequate volume   Culture   Final    NO GROWTH 3 DAYS Performed at [facility], [address] [place], [place] [address]    Report Status PENDING  Incomplete  Culture, blood (Routine X 2) w Reflex to ID Panel     Status: None (Preliminary result)   Collection Time: 11/07/22  9:08 AM   Specimen: BLOOD  Result Value Ref Range Status   Specimen Description BLOOD BLOOD LEFT HAND  Final   Special Requests   Final    BOTTLES DRAWN AEROBIC AND ANAEROBIC Blood Culture adequate volume   Culture   Final    NO GROWTH 3 DAYS Performed at [facility], [address] [place], [place] [address]    Report Status PENDING  Incomplete      Labs: Basic Metabolic Panel: Recent Labs  Lab 11/06/22 0627 11/07/22 0244 11/08/22 0935 11/09/22 0359 11/10/22 0610  NA 135 140 140 140 142  K 4.4 4.0 3.8 4.2 4.1  CL 107 104 100 107 107  CO2 '25 26 29 27 28  '$ GLUCOSE 100* 104* 126* 100* 108*  BUN 43* 43* 44* 47* 40*  CREATININE 2.11* 1.83* 1.61* 1.63* 1.64*  CALCIUM 8.2* 8.8* 9.4 8.9 8.9   Liver Function Tests: Recent Labs  Lab 11/04/22 2103  AST 30  ALT 18  ALKPHOS 66  BILITOT 1.5*  PROT 7.0  ALBUMIN 4.0   No results for input(s): "LIPASE", "AMYLASE" in the last 168 hours. No results for input(s): "AMMONIA" in the last 168 hours. CBC: Recent Labs  Lab 11/04/22 2103 11/05/22 0506 11/06/22 0627 11/07/22 0244 11/10/22 0610  WBC 15.1* 15.9* 11.6* 8.8 11.2*  NEUTROABS 13.7*  --  9.2* 6.6  --   HGB 13.9 13.0 12.4* 12.4* 14.6  HCT 43.5 40.4 39.0 39.2 45.9  MCV 95.0 95.7 95.1 94.0 94.1  PLT 193 166 151 185 261   Cardiac Enzymes: No results for input(s): "CKTOTAL", "CKMB", "CKMBINDEX", "TROPONINI" in the last 168 hours. BNP: BNP (last 3 results) Recent Labs    11/04/22 2103  BNP 1,216.0*    ProBNP (last 3 results) No results for input(s): "PROBNP" in the last 8760 hours.  CBG: Recent Labs  Lab 11/05/22 2103  GLUCAP 99       Signed:  Desma Maxim MD.  [facility] 11/10/2022, 3:33 PM

## 2022-11-10 NOTE — Plan of Care (Signed)

## 2022-11-10 NOTE — Progress Notes (Signed)
PHARMACY CONSULT NOTE FOR:  OUTPATIENT  PARENTERAL ANTIBIOTIC THERAPY (OPAT)  Indication: S pneumoniae bacteremia Regimen: Ceftriaxone 2gm IV q24h End date: 11/21/2022  Labs - Once weekly:  CBC/D and CMP Fax weekly labs to [phone] Pull PICC at completion of antibiotics  IV antibiotic discharge orders are pended. To discharging provider:  please sign these orders via discharge navigator,  Select New Orders & click on the button choice - Manage This Unsigned Work.     Thank you for allowing pharmacy to be a part of this patient's care.  Doreene Eland, PharmD, BCPS, BCIDP Work Cell: [phone] 11/10/2022 10:27 AM

## 2022-11-10 NOTE — Progress Notes (Signed)
[facility] for Infectious Disease  Date of Admission:  11/04/2022           Reason for visit: Follow up on Strep pneumo bacteremia  Current antibiotics: Ceftriaxone   ASSESSMENT:    77 y.o. male admitted with:  Invasive strep pneumo bacteremia:  Etiology of bacteremia appears to be secondary to right leg cellulitis that is improved although this would be unusual with strep pneumo.  However, there was no other localizing source noted on CT chest and CT abd/pelvis.  The erythema and swelling of his right leg is much improved following antibiotic therapy and bacteremia has been low grade with only 1 of 3 admission culture bottles positive and repeat cultures 11/07/22 NGTD.  TEE 11/09/22 showed a thin fibrinous density on the RA lead.  This was not convincing for infected vegetation as these can be common findings in patients with chronic devices.  Discussed with EP and patient.  Mutually agreed to IV antibiotics and surveillance cultures as he would also not be a great candidate for lead extraction given other co-morbidities. History of CHB: Status post CIED placement in 2016.   RECOMMENDATIONS:    Continue ceftriaxone 2gm daily Will plan for 2 weeks of IV antibiotics plus surveillance cultures at the end of therapy Patient and wife prefer to follow up in [facility] so will arrange follow up with Dr Delaine Lame at [facility] ID clinic See OPAT note below.  Please call with any questions/concerns.   Diagnosis: Bacteremia and cellulitis  Culture Result: Strep pneumo  Allergies  Allergen Reactions   Linaclotide Shortness Of Breath and Other (See Comments)   Codeine Diarrhea, Nausea And Vomiting and Other (See Comments)   Oxycodone Nausea And Vomiting   Terazosin Other (See Comments)    Dizziness / altered mental state  Dizziness / altered mental state  Dizziness / altered mental state    Gabapentin Other (See Comments)    dizziness Other reaction(s): Other (See  Comments) dizziness   Hydrocodone-Acetaminophen Diarrhea, Nausea And Vomiting and Other (See Comments)   Lubiprostone Other (See Comments)    Abdominal cramps Other reaction(s): Other (See Comments) Abdominal cramps Abdominal cramps   Norco [Hydrocodone-Acetaminophen] Diarrhea and Nausea And Vomiting   Omeprazole Diarrhea, Nausea And Vomiting and Other (See Comments)    Stomach cramps   Ropinirole Nausea Only and Other (See Comments)    OPAT Orders Discharge antibiotics to be given via PICC line Discharge antibiotics: Per pharmacy protocol  Ceftriaxone 2gm IV daily  Duration: 2 weeks  End Date: 11/21/22  [facility] Care Per Protocol:  Home health RN for IV administration and teaching; PICC line care and labs.    Labs weekly while on IV antibiotics: _xx_ CBC with differential __ BMP _xx_ CMP __ CRP __ ESR __ Vancomycin trough __ CK  xx__ Please pull PIC at completion of IV antibiotics __ Please leave PIC in place until doctor has seen patient or been notified  Fax weekly labs to [phone]  Clinic Follow Up Appt: 12/01/22 at 11:30am with Dr Delaine Lame      Principal Problem:   Sepsis due to cellulitis [facility]) Active Problems:   Essential hypertension   Acute on chronic diastolic CHF (congestive heart failure) (HCC)   Paroxysmal atrial fibrillation (HCC)   GERD without esophagitis   History of gout   Peripheral neuropathy   Stage III chronic kidney disease ([place])   Acute respiratory failure with hypoxia ([place])   Bacteremia due to Streptococcus pneumoniae  Cardiomyopathy ([place])   Bacteremia    MEDICATIONS:    Scheduled Meds:  allopurinol  100 mg Oral Daily   apixaban  5 mg Oral BID   bisacodyl  5 mg Oral BID   carvedilol  3.125 mg Oral BID WC   furosemide  60 mg Oral Daily   magnesium oxide  400 mg Oral QHS   multivitamin with minerals  1 tablet Oral Daily   pantoprazole  40 mg Oral Daily   potassium chloride SA  40 mEq Oral Daily   pramipexole  1  mg Oral QID   sacubitril-valsartan  1 tablet Oral BID   selegiline  5 mg Oral BID WC   senna-docusate  2 tablet Oral BID   sodium chloride flush  10-40 mL Intracatheter Q12H   Continuous Infusions:  cefTRIAXone (ROCEPHIN)  IV 200 mL/hr at 11/09/22 1546   PRN Meds:.acetaminophen **OR** acetaminophen, magnesium hydroxide, metolazone, ondansetron **OR** ondansetron (ZOFRAN) IV, polyethylene glycol, sodium chloride flush, traZODone  SUBJECTIVE:   24 hour events:  No acute events noted TEE done without issues Repeat cx 1/29 remain NGTD Discussed with patient plan for IV antibiotics through 11/21/22  No new complaints.  Waiting on PT to work with him.  LOoking forward to going home today.   Review of Systems  All other systems reviewed and are negative.     OBJECTIVE:   Blood pressure 122/64, pulse 69, temperature 98.1 F (36.7 C), resp. rate 18, weight 111 kg, SpO2 97 %. Body mass index is 33.19 kg/m.  Physical Exam Constitutional:      Appearance: Normal appearance.  HENT:     Head: Normocephalic and atraumatic.  Eyes:     Extraocular Movements: Extraocular movements intact.     Conjunctiva/sclera: Conjunctivae normal.  Pulmonary:     Effort: Pulmonary effort is normal. No respiratory distress.  Abdominal:     General: There is no distension.     Palpations: Abdomen is soft.  Musculoskeletal:     Cervical back: Normal range of motion and neck supple.     Comments: Midline in place.   Skin:    General: Skin is warm and dry.  Neurological:     General: No focal deficit present.     Mental Status: He is alert. Mental status is at baseline.  Psychiatric:        Mood and Affect: Mood normal.        Behavior: Behavior normal.      Lab Results: Lab Results  Component Value Date   WBC 8.8 11/07/2022   HGB 12.4 (L) 11/07/2022   HCT 39.2 11/07/2022   MCV 94.0 11/07/2022   PLT 185 11/07/2022    Lab Results  Component Value Date   NA 140 11/09/2022   K 4.2  11/09/2022   CO2 27 11/09/2022   GLUCOSE 100 (H) 11/09/2022   BUN 47 (H) 11/09/2022   CREATININE 1.63 (H) 11/09/2022   CALCIUM 8.9 11/09/2022   GFRNONAA 44 (L) 11/09/2022   GFRAA 43 (L) 04/28/2020    Lab Results  Component Value Date   ALT 18 11/04/2022   AST 30 11/04/2022   ALKPHOS 66 11/04/2022   BILITOT 1.5 (H) 11/04/2022    No results found for: "CRP"  No results found for: "ESRSEDRATE"   I have reviewed the micro and lab results in Epic.  Imaging: ECHO TEE  Result Date: 11/09/2022    TRANSESOPHOGEAL ECHO REPORT   Patient Name:   JOANNA HALL Date of  Exam: 11/09/2022 Medical Rec #:  [id number]     Height:       72.0 in Accession #:    [id number]    Weight:       244.7 lb Date of Birth:  October 30, 1945     BSA:          2.321 m Patient Age:    57 years      BP:           110/63 mmHg Patient Gender: M             HR:           70 bpm. Exam Location:  [facility] Procedure: Transesophageal Echo, Color Doppler and Cardiac Doppler Indications:     Bacteremia R78.81  History:         Patient has prior history of Echocardiogram examinations, most                  recent 11/07/2022. CHF, Pacemaker, Arrythmias:Atrial                  Fibrillation; Risk Factors:Hypertension.  Sonographer:     Sherrie Sport Referring Phys:  [id number] [place] Diagnosing Phys: Kate Sable MD PROCEDURE: The transesophogeal probe was passed without difficulty through the esophogus of the patient. Sedation performed by performing physician. The patient developed no complications during the procedure.  IMPRESSIONS  1. Left ventricular ejection fraction, by estimation, is 40 to 45%. The left ventricle has mild to moderately decreased function.  2. 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 Right ventricular systolic function is normal. The right ventricular size is normal.  3. Left atrial size was mild to moderately dilated. No left atrial/left atrial appendage thrombus was  detected.  4. The mitral valve is normal in structure. Trivial mitral valve regurgitation.  5. The aortic valve is tricuspid. Aortic valve regurgitation is mild to moderate. Conclusion(s)/Recommendation(s): Findings are concerning for vegetation/infective endocarditis as detailed above. FINDINGS  Left Ventricle: Left ventricular ejection fraction, by estimation, is 40 to 45%. The left ventricle has mild to moderately decreased function. The left ventricular internal cavity size was normal in size. 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 The right ventricular size is normal. No increase in right ventricular wall thickness. Right ventricular systolic function is normal. Left Atrium: Left atrial size was mild to moderately dilated. Spontaneous echo contrast was present. No left atrial/left atrial appendage thrombus was detected. Right Atrium: Right atrial size was normal in size. Pericardium: There is no evidence of pericardial effusion. Mitral Valve: The mitral valve is normal in structure. Trivial mitral valve regurgitation. Tricuspid Valve: The tricuspid valve is normal in structure. Tricuspid valve regurgitation is not demonstrated. Aortic Valve: The aortic valve is tricuspid. Aortic valve regurgitation is mild to moderate. Pulmonic Valve: The pulmonic valve was normal in structure. Pulmonic valve regurgitation is not visualized. Aorta: The aortic root is normal in size and structure. IAS/Shunts: No atrial level shunt detected by color flow Doppler. Additional Comments: A device lead is visualized in the right atrium and right ventricle. Kate Sable MD Electronically signed by Kate Sable MD Signature Date/Time: 11/09/2022/1:43:38 PM    Final      Imaging independently reviewed in Epic.    [facility] for Infectious Disease [facility] [phone] pager 11/10/2022, 6:17 AM  I have personally  spent 50 minutes involved in face-to-face  and non-face-to-face activities for this patient on the day of the visit. Professional time spent includes the following activities: Preparing to see the patient (review of tests), Obtaining and/or reviewing separately obtained history (admission/discharge record), Performing a medically appropriate examination and/or evaluation , Ordering medications/tests/procedures, referring and communicating with other health care professionals, Documenting clinical information in the EMR, Independently interpreting results (not separately reported), Communicating results to the patient/family/caregiver, Counseling and educating the patient/family/caregiver and Care coordination (not separately reported).

## 2022-11-11 ENCOUNTER — Emergency Department
Admission: EM | Admit: 2022-11-11 | Discharge: 2022-11-11 | Disposition: A | Discharge status: Home / Self Care | Payer: Medicare Other | Attending: Emergency Medicine | Admitting: Emergency Medicine

## 2022-11-11 ENCOUNTER — Other Ambulatory Visit: Payer: Self-pay

## 2022-11-11 ENCOUNTER — Telehealth: Payer: Self-pay | Admitting: Pharmacist

## 2022-11-11 ENCOUNTER — Other Ambulatory Visit (HOSPITAL_COMMUNITY): Payer: Self-pay

## 2022-11-11 ENCOUNTER — Encounter: Payer: Medicare Other | Admitting: Family

## 2022-11-11 DIAGNOSIS — Y712 Prosthetic and other implants, materials and accessory cardiovascular devices associated with adverse incidents: Secondary | ICD-10-CM | POA: Insufficient documentation

## 2022-11-11 DIAGNOSIS — T82594A Other mechanical complication of infusion catheter, initial encounter: Secondary | ICD-10-CM | POA: Diagnosis present

## 2022-11-11 DIAGNOSIS — I10 Essential (primary) hypertension: Secondary | ICD-10-CM | POA: Insufficient documentation

## 2022-11-11 DIAGNOSIS — G20C Parkinsonism, unspecified: Secondary | ICD-10-CM | POA: Insufficient documentation

## 2022-11-11 DIAGNOSIS — T829XXA Unspecified complication of cardiac and vascular prosthetic device, implant and graft, initial encounter: Secondary | ICD-10-CM

## 2022-11-11 DIAGNOSIS — I2510 Atherosclerotic heart disease of native coronary artery without angina pectoris: Secondary | ICD-10-CM | POA: Insufficient documentation

## 2022-11-11 MED ORDER — SODIUM CHLORIDE 0.9 % IV SOLN
1.0000 g | Freq: Once | INTRAVENOUS | Status: AC
  Administered 2022-11-11: 1 g via INTRAVENOUS
  Filled 2022-11-11: qty 10

## 2022-11-11 NOTE — Discharge Instructions (Signed)
Continue IV antibiotics via PICC line as planned previously.  Thank you for choosing [facility] for your health care today!  Please see your primary doctor this week for a follow up appointment.   Sometimes, in the early stages of certain disease courses it is difficult to detect in the emergency department evaluation -- so, it is important that you continue to monitor your symptoms and call your doctor right away or return to the emergency department if you develop any new or worsening symptoms.  Please go to the following website to schedule new (and existing) patient appointments:   [email or web address]  If you do not have a primary doctor try calling the following clinics to establish care:  If you have insurance:  [facility] [phone] [place] [address]   [facility]  [phone] [place] [place] [address]   If you do not have insurance:  [facility]  [phone] [address] [place] [address]   The following is another list of primary care offices in the area who are accepting new patients at this time.  Please reach out to one of them directly and let them know you would like to schedule an appointment to follow up on an Emergency Department visit, and/or to establish a new primary care provider (PCP).  There are likely other primary care clinics in the are who are accepting new patients, but this is an excellent place to start:  [place] physician: Dr Lavon Paganini [address] [place], [place] [address] [phone]  [facility] Lead Physician: Dr Steele Sizer [address], [place], [place] [address] [phone]  [place] Physician: Dr Park Liter [address] [place], [place] [address] [phone]  [facility] Lead Physician: Dr Dewaine Oats [place],  [place], [place] [address] [phone]  [place] [facility] [facility] [place] Physician: Dr Halina Maidens [address] Broach [place], [place] [address] [phone]   It was my pleasure to care for you today.   [place] Brunette Jacelyn Grip, MD

## 2022-11-11 NOTE — ED Provider Notes (Signed)
[facility] Provider Note    Event Date/Time   First MD Initiated Contact with Patient 11/11/22 1215     (approximate)   History   Vascular Access Problem   HPI  Matthew Howe is a 77 y.o. male   Past medical history of Parkinson's, complete heart block status post pacemaker placement, osteoarthritis, atrial fibrillation, GERD, hypertension, dyslipidemia, CAD who comes to the emergency department because his PICC line became dislodged this morning.  He was recently discharged from the hospital yesterday for cellulitis of the right lower extremity also blood cultures positive for strep pneumonia and a question of whether there was a vegetation on his pacemaker lead for which she was discharged with a PICC line in place and receiving IV Rocephin with home rehab.  Since being home he has been faring well according to himself and his wife with no further fevers, tolerating p.o. intake, no trauma or worsening except this morning when he went to the bathroom he inadvertently dislodged the access in his left arm.  He was unable to receive his Rocephin today due to the PICC line problem.  He has no other acute medical complaints.  Independent Historian contributed to assessment above: His wife  External Medical Documents Reviewed: Discharge summary from yesterday 11/10/2022 with the above hospital course      Physical Exam   Triage Vital Signs: ED Triage Vitals  Enc Vitals Group     BP 11/11/22 1158 (!) 101/58     Pulse Rate 11/11/22 1158 69     Resp 11/11/22 1158 18     Temp 11/11/22 1158 98.7 F (37.1 C)     Temp Source 11/11/22 1158 Oral     SpO2 11/11/22 1158 100 %     Weight 11/11/22 1208 244 lb 11.4 oz (111 kg)     Height 11/11/22 1208 6' (1.829 m)     Head Circumference --      Peak Flow --      Pain Score 11/11/22 1208 0     Pain Loc --      Pain Edu? --      Excl. in [place]? --     Most recent vital signs: Vitals:   11/11/22 1158  BP: (!)  101/58  Pulse: 69  Resp: 18  Temp: 98.7 F (37.1 C)  SpO2: 100%    General: Awake, no distress.  CV:  Good peripheral perfusion.  Resp:  Normal effort.  Abd:  No distention.  Other:  BP slightly low at 101/58; awake comfortable appearing nontoxic appearing abdomen soft and nontender breathing comfortably on room air no hypoxemia and the skin surrounding the PICC line site on the L arm does not appear infected.   ED Results / Procedures / Treatments   Labs (all labs ordered are listed, but only abnormal results are displayed) Labs Reviewed - No data to display  PROCEDURES:  Critical Care performed: No  Procedures   MEDICATIONS ORDERED IN ED: Medications  cefTRIAXone (ROCEPHIN) 1 g in sodium chloride 0.9 % 100 mL IVPB (has no administration in time range)    External physician / consultants:  I spoke with PICC line team regarding care plan for this patient.   IMPRESSION / MDM / ASSESSMENT AND PLAN / ED COURSE  I reviewed the triage vital signs and the nursing notes.  Patient's presentation is most consistent with acute illness / injury with system symptoms.  Differential diagnosis includes, but is not limited to, skin infection, vascular injury, PICC line removal, sepsis or worsening infection   The patient is on the cardiac monitor to evaluate for evidence of arrhythmia and/or significant heart rate changes.  MDM: Patient recovering well from home with no acute medical complaints, no signs of infection on my exam, who presents with PICC line removal and need for replacement, need for Rocephin IV given missed doses of morning.  Consulted peripheral IV team for PICC line replacement and will give IV Rocephin prior to discharge home.         FINAL CLINICAL IMPRESSION(S) / ED DIAGNOSES   Final diagnoses:  Complication associated with peripherally inserted central catheter (PICC), initial encounter     Rx / DC Orders   ED  Discharge Orders     None        Note:  This document was prepared using Dragon voice recognition software and may include unintentional dictation errors.    Lucillie Garfinkel, MD 11/11/22 1239

## 2022-11-11 NOTE — Progress Notes (Signed)
IV consult for IV access pulled by patient at home. LUA midline assessed. Extension pulled by patient with LUA midline, CDI. IV extension and tubing changed, flushes easily with GBR and ready to use. RN aware.

## 2022-11-11 NOTE — Telephone Encounter (Signed)
Matthew Howe from [facility] [phone]) called this morning about patient's PICC line. Patient's wife called and stated that the patient accidentally pulled port off from PICC line and that they cannot administer antibiotics at this time. I realize patient was discharged yesterday and has plans to continue ceftriaxone through 11/21/22. Please advise on best approach for port replacement and/or trialing oral antibiotics instead.  Thanks!  Alfonse Spruce, PharmD, CPP, BCIDP, [place] Clinical Pharmacist Practitioner Infectious [facility] for Infectious Disease

## 2022-11-11 NOTE — ED Triage Notes (Addendum)
Pt to ED via POV from home. Wife reports pt was confused and pulled PICC line out. Pt had PICC line placed for antibiotic therapy. Pt initial BP 15W systolic and repeat BP reading 101/58

## 2022-11-12 LAB — CULTURE, BLOOD (ROUTINE X 2)
Culture: NO GROWTH
Culture: NO GROWTH
Special Requests: ADEQUATE
Special Requests: ADEQUATE

## 2022-11-14 NOTE — Telephone Encounter (Signed)
Spoke with Malachy Mood at [facility] to find out if PICC line had been pulled by patient or if [place] just came disconnected. She states patient went to the ED Friday, PICC line was removed and replaced with a midline.   She is on her way to his home now to continue IV Abx.   Beryle Flock, RN

## 2022-11-16 ENCOUNTER — Encounter: Payer: Self-pay | Admitting: Cardiovascular Disease

## 2022-11-21 ENCOUNTER — Other Ambulatory Visit: Payer: Self-pay | Admitting: Cardiovascular Disease

## 2022-11-22 LAB — LAB REPORT - SCANNED: EGFR: 54

## 2022-11-22 NOTE — Progress Notes (Unsigned)
Patient ID: Matthew Howe, male    DOB: 12-13-1945, 77 y.o.   MRN: [id number]  HPI This is a 77 y/o male with a history of parkinson's, HTN, hyperlipidemia, GERD, CAD, complete heart block (pacemaker), atrial fibrillation, AICD and chronic heart failure.   TEE 11/09/22 showed EF 40-45% along with vegetation attached to RA pacemaker lead and mild/moderate AR. Echo done 12/08/16 reviewed and shows an EF of 55-60% along with mild AR/MR. Previous echo was done 07/28/15 and showed an EF of 55-60% along with mild AR.   Had a cardiac catheterization done 11/20/12 and showed mild nonobstructive disease.  Was in the ED 11/11/22 due to PICC line being dislodged. PICC line replaced. Admitted 11/04/22 due to sepsis with acute respiratory failure. Blood cultures positive. Echo showed possible vegetation on pacemaker lead. Poor extraction candidate. PICC line placed for 2 weeks of antibiotics.    He presents today for an initial visit as he hasn't been seen since 2019. He presents with a chief complaint of   Past Medical History:  Diagnosis Date   Arthritis    Atrial fibrillation ([place])    Atrial fibrillation ([employer] [place])    Carpal tunnel syndrome on both sides    CHF (congestive heart failure) ([place])    CKD (chronic kidney disease) 10/2018   recent diagnosis   Complete heart block ([place])    a. s/p St. Jude PPM 11/2014.   Complication of anesthesia    stayed overnight dt under too long d/t taking Parkinson's medication (2013?)   Coronary artery disease    GERD (gastroesophageal reflux disease)    "not as bad as it used to be"   Heart murmur    History of recent blood transfusion 07/2018   Hyperlipidemia    Hypertension    IBS (irritable bowel syndrome)    Lower extremity edema    Parkinson's disease    Presence of permanent cardiac pacemaker    St Jude   Shortness of breath dyspnea    Past Surgical History:  Procedure Laterality Date   APPENDECTOMY  12/2010   BACK SURGERY  08/06/2018   lower lumbarsacral  region, has rods in place. (done at [facility])   [place]  11/20/2012   Known CAD with one vessel coronary disease of left descending artery by cardiac cath.    CARDIOVERSION N/A 11/21/2016   Procedure: CARDIOVERSION;  Surgeon: Deboraha Sprang, MD;  Location: [facility];  Service: Cardiovascular;  Laterality: N/A;   CARDIOVERSION N/A 03/08/2019   Procedure: CARDIOVERSION;  Surgeon: Fay Records, MD;  Location: [facility] ENDOSCOPY;  Service: Cardiovascular;  Laterality: N/A;   CARPAL TUNNEL RELEASE Right 10/22/2018   Procedure: CARPAL TUNNEL RELEASE;  Surgeon: Dereck Leep, MD;  Location: [facility];  Service: Orthopedics;  Laterality: Right;   CARPAL TUNNEL RELEASE Left 05/22/2019   Procedure: CARPAL TUNNEL RELEASE - LEFT;  Surgeon: Dereck Leep, MD;  Location: [facility];  Service: Orthopedics;  Laterality: Left;   COLONOSCOPY  2018   COLONOSCOPY N/A 10/21/2020   Procedure: COLONOSCOPY;  Surgeon: Robert Bellow, MD;  Location: [facility] ENDOSCOPY;  Service: Endoscopy;  Laterality: N/A;   HERNIA REPAIR  [id number]   umbilical hernia repair   INSERT / REPLACE / REMOVE PACEMAKER     PACEMAKER INSERTION  11-21-14   STJ Assurity dual chamber pacemaker implanted by Dr Rayann Heman for CHB   PERMANENT PACEMAKER INSERTION N/A 11/21/2014   Procedure: PERMANENT PACEMAKER INSERTION;  Surgeon: Thompson Grayer, MD;  Location: [facility]  CATH LAB;  Service: Cardiovascular;  Laterality: N/A;   TEE WITHOUT CARDIOVERSION N/A 11/21/2016   Procedure: TRANSESOPHAGEAL ECHOCARDIOGRAM (TEE);  Surgeon: Deboraha Sprang, MD;  Location: [facility];  Service: Cardiovascular;  Laterality: N/A;   TEE WITHOUT CARDIOVERSION N/A 11/09/2022   Procedure: TRANSESOPHAGEAL ECHOCARDIOGRAM;  Surgeon: Kate Sable, MD;  Location: [facility];  Service: Cardiovascular;  Laterality: N/A;   UPPER GI ENDOSCOPY     Family History  Problem Relation Age of Onset   Heart disease Mother    Heart disease Father    Colon cancer Father    Stroke Sister     Social History   Tobacco Use   Smoking status: Never   Smokeless tobacco: Never  Substance Use Topics   Alcohol use: No    Alcohol/week: 0.0 standard drinks of alcohol   Allergies  Allergen Reactions   Linaclotide Shortness Of Breath and Other (See Comments)   Codeine Diarrhea, Nausea And Vomiting and Other (See Comments)   Oxycodone Nausea And Vomiting   Terazosin Other (See Comments)    Dizziness / altered mental state  Dizziness / altered mental state  Dizziness / altered mental state    Gabapentin Other (See Comments)    dizziness Other reaction(s): Other (See Comments) dizziness   Hydrocodone-Acetaminophen Diarrhea, Nausea And Vomiting and Other (See Comments)   Lubiprostone Other (See Comments)    Abdominal cramps Other reaction(s): Other (See Comments) Abdominal cramps Abdominal cramps   Norco [Hydrocodone-Acetaminophen] Diarrhea and Nausea And Vomiting   Omeprazole Diarrhea, Nausea And Vomiting and Other (See Comments)    Stomach cramps   Ropinirole Nausea Only and Other (See Comments)    Review of Systems  Constitutional:  Positive for fatigue. Negative for appetite change.  HENT:  Negative for congestion, rhinorrhea and sore throat.   Eyes: Negative.   Respiratory:  Positive for shortness of breath. Negative for chest tightness.   Cardiovascular:  Positive for leg swelling. Negative for chest pain and palpitations.  Gastrointestinal:  Positive for abdominal distention. Negative for abdominal pain.  Endocrine: Negative.   Genitourinary: Negative.   Musculoskeletal:  Positive for arthralgias (both hands swollen and stinging) and back pain. Negative for neck pain.  Skin: Negative.   Allergic/Immunologic: Negative.   Neurological:  Positive for tremors. Negative for dizziness and light-headedness.  Hematological:  Negative for adenopathy. Does not bruise/bleed easily.  Psychiatric/Behavioral:  Positive for sleep disturbance (sleeping on 2 pillows; sometimes  restless sleep). Negative for dysphoric mood. The patient is not nervous/anxious.      Physical Exam Vitals and nursing note reviewed.  Constitutional:      Appearance: He is well-developed.  HENT:     Head: Normocephalic and atraumatic.  Eyes:     Conjunctiva/sclera: Conjunctivae normal.     Pupils: Pupils are equal, round, and reactive to light.  Neck:     Vascular: No JVD.  Cardiovascular:     Rate and Rhythm: Normal rate and regular rhythm.  Pulmonary:     Effort: Pulmonary effort is normal.     Breath sounds: No wheezing or rales.  Abdominal:     General: There is distension.     Palpations: Abdomen is soft.     Tenderness: There is no abdominal tenderness.  Musculoskeletal:        General: No tenderness.     Cervical back: Normal range of motion and neck supple.     Comments: Both hands are swollen over the dorsum of the hands and all the fingers  are also swollen.   Skin:    General: Skin is warm and dry.  Neurological:     Mental Status: He is alert and oriented to person, place, and time.  Psychiatric:        Behavior: Behavior normal.        Thought Content: Thought content normal.    Assessment & Plan:  1: Chronic heart failure with reduced ejection fraction- - NYHA class III - mildly fluid overloaded today - weighing daily; Reminded to call for an overnight weight gain of >2 pounds or a weekly weight gain of >5 pounds - not adding salt to his food and wife is diligent about reading food labels - GDMT of entresto - saw cardiologist Rockey Situ) 08/01/22 - PharmD reconciled medications with the patient - does not take the flu vaccine; encouraged good handwashing - BNP 11/04/22 was 1216.0  2: HTN- - BP  - saw PCP Ola Spurr) 11/16/22 - BMP from 11/10/22 reviewed and showed sodium 142, potassium 4.1, creatinine 1.64  and GFR 43 - saw nephrology Candiss Norse) 09/05/22  3: Atrial fibrillation- - cardioversion done 11/21/16   - saw EP Caryl Comes) 08/30/22  4: Parkinson's  Disease- - continues to have tremors - saw neurologist Melrose Nakayama) 09/29/22  5: Endocarditis- - PICC line in place for IV antibiotics   Medication list was reviewed.

## 2022-11-22 NOTE — Telephone Encounter (Signed)
Prescription refill request for Eliquis received. Indication: AF Last office visit:  08/30/22  Olin Pia MD Scr: 1.64 on 11/10/22 Age: 77 Weight: 115.2kg  Based on above findings Eliquis 85m twice daily is the appropriate dose.  Refill approved.

## 2022-11-22 NOTE — Telephone Encounter (Signed)
Please review

## 2022-11-23 ENCOUNTER — Encounter: Payer: Self-pay | Admitting: Family

## 2022-11-23 ENCOUNTER — Other Ambulatory Visit
Admission: RE | Admit: 2022-11-23 | Discharge: 2022-11-23 | Disposition: A | Discharge status: Home / Self Care | Payer: Medicare Other | Source: Ambulatory Visit | Attending: Family | Admitting: Family

## 2022-11-23 ENCOUNTER — Other Ambulatory Visit: Payer: Self-pay | Admitting: Family

## 2022-11-23 ENCOUNTER — Ambulatory Visit (HOSPITAL_BASED_OUTPATIENT_CLINIC_OR_DEPARTMENT_OTHER): Payer: Medicare Other | Admitting: Family

## 2022-11-23 VITALS — BP 139/68 | HR 70 | Resp 18 | Wt 256.1 lb

## 2022-11-23 DIAGNOSIS — I442 Atrioventricular block, complete: Secondary | ICD-10-CM | POA: Insufficient documentation

## 2022-11-23 DIAGNOSIS — I130 Hypertensive heart and chronic kidney disease with heart failure and stage 1 through stage 4 chronic kidney disease, or unspecified chronic kidney disease: Secondary | ICD-10-CM | POA: Diagnosis not present

## 2022-11-23 DIAGNOSIS — G8929 Other chronic pain: Secondary | ICD-10-CM | POA: Diagnosis not present

## 2022-11-23 DIAGNOSIS — I10 Essential (primary) hypertension: Secondary | ICD-10-CM | POA: Diagnosis not present

## 2022-11-23 DIAGNOSIS — R531 Weakness: Secondary | ICD-10-CM | POA: Insufficient documentation

## 2022-11-23 DIAGNOSIS — I4819 Other persistent atrial fibrillation: Secondary | ICD-10-CM

## 2022-11-23 DIAGNOSIS — I5022 Chronic systolic (congestive) heart failure: Secondary | ICD-10-CM

## 2022-11-23 DIAGNOSIS — G20A1 Parkinson's disease without dyskinesia, without mention of fluctuations: Secondary | ICD-10-CM | POA: Insufficient documentation

## 2022-11-23 DIAGNOSIS — F32A Depression, unspecified: Secondary | ICD-10-CM | POA: Insufficient documentation

## 2022-11-23 DIAGNOSIS — R0602 Shortness of breath: Secondary | ICD-10-CM | POA: Insufficient documentation

## 2022-11-23 DIAGNOSIS — G479 Sleep disorder, unspecified: Secondary | ICD-10-CM | POA: Insufficient documentation

## 2022-11-23 DIAGNOSIS — I2510 Atherosclerotic heart disease of native coronary artery without angina pectoris: Secondary | ICD-10-CM | POA: Insufficient documentation

## 2022-11-23 DIAGNOSIS — K219 Gastro-esophageal reflux disease without esophagitis: Secondary | ICD-10-CM | POA: Diagnosis not present

## 2022-11-23 DIAGNOSIS — Z79899 Other long term (current) drug therapy: Secondary | ICD-10-CM | POA: Insufficient documentation

## 2022-11-23 DIAGNOSIS — I38 Endocarditis, valve unspecified: Secondary | ICD-10-CM

## 2022-11-23 DIAGNOSIS — N189 Chronic kidney disease, unspecified: Secondary | ICD-10-CM | POA: Insufficient documentation

## 2022-11-23 DIAGNOSIS — R5383 Other fatigue: Secondary | ICD-10-CM | POA: Insufficient documentation

## 2022-11-23 DIAGNOSIS — Z8249 Family history of ischemic heart disease and other diseases of the circulatory system: Secondary | ICD-10-CM | POA: Diagnosis not present

## 2022-11-23 DIAGNOSIS — Z7901 Long term (current) use of anticoagulants: Secondary | ICD-10-CM | POA: Diagnosis not present

## 2022-11-23 DIAGNOSIS — E785 Hyperlipidemia, unspecified: Secondary | ICD-10-CM | POA: Insufficient documentation

## 2022-11-23 DIAGNOSIS — Z95810 Presence of automatic (implantable) cardiac defibrillator: Secondary | ICD-10-CM | POA: Insufficient documentation

## 2022-11-23 DIAGNOSIS — I4891 Unspecified atrial fibrillation: Secondary | ICD-10-CM | POA: Insufficient documentation

## 2022-11-23 LAB — BASIC METABOLIC PANEL
Anion gap: 7 (ref 5–15)
BUN: 30 mg/dL — ABNORMAL HIGH (ref 8–23)
CO2: 25 mmol/L (ref 22–32)
Calcium: 8.9 mg/dL (ref 8.9–10.3)
Chloride: 108 mmol/L (ref 98–111)
Creatinine, Ser: 1.25 mg/dL — ABNORMAL HIGH (ref 0.61–1.24)
GFR, Estimated: >60 mL/min (ref >60)
Glucose, Bld: 105 mg/dL — ABNORMAL HIGH (ref 70–99)
Potassium: 4.5 mmol/L (ref 3.5–5.1)
Sodium: 140 mmol/L (ref 135–145)

## 2022-11-23 MED ORDER — SACUBITRIL-VALSARTAN 24-26 MG PO TABS: 1.0000 | ORAL_TABLET | Freq: Two times a day (BID) | ORAL | 1 refills | Status: DC

## 2022-11-23 MED ORDER — SACUBITRIL-VALSARTAN 24-26 MG PO TABS: 1.0000 | ORAL_TABLET | Freq: Two times a day (BID) | ORAL | 3 refills | Status: DC

## 2022-11-23 NOTE — Telephone Encounter (Signed)
Prior Authorization initiated in [email or web address] for Entresto 24-26 mg tablets.  KEY: [id number] Response: Your PA request has been sent to [place]. If [facility] has not responded to your request within 24 hours, contact [place] at [phone].

## 2022-11-23 NOTE — Telephone Encounter (Signed)
Can you check on a PA for this patient's Entresto please and thank you

## 2022-11-23 NOTE — Patient Instructions (Signed)
Continue weighing daily and call for an overnight weight gain of 3 pounds or more or a weekly weight gain of more than 5 pounds. ? ? ?If you have voicemail, please make sure your mailbox is cleaned out so that we may leave a message and please make sure to listen to any voicemails.  ? ? ?

## 2022-11-24 NOTE — Telephone Encounter (Signed)
Office note/echo report faxed to [place] as requested.

## 2022-11-29 NOTE — Progress Notes (Signed)
Remote pacemaker transmission.   

## 2022-11-30 NOTE — Telephone Encounter (Signed)
Spoke with Matthew C. at [facility] who states they did not receive the office note/echo report despite our office receiving a fax confirmation. She started a new PA through [email or web address] KEY: [id number] PA completed with office notes attached.

## 2022-12-01 ENCOUNTER — Other Ambulatory Visit
Admission: RE | Admit: 2022-12-01 | Discharge: 2022-12-01 | Disposition: A | Discharge status: Home / Self Care | Payer: Medicare Other | Attending: Infectious Diseases | Admitting: Infectious Diseases

## 2022-12-01 ENCOUNTER — Ambulatory Visit: Payer: Medicare Other | Attending: Infectious Diseases | Admitting: Infectious Diseases

## 2022-12-01 ENCOUNTER — Encounter: Payer: Self-pay | Admitting: Infectious Diseases

## 2022-12-01 VITALS — BP 137/76 | HR 70 | Temp 96.8°F

## 2022-12-01 DIAGNOSIS — Z5189 Encounter for other specified aftercare: Secondary | ICD-10-CM | POA: Diagnosis present

## 2022-12-01 DIAGNOSIS — Z950 Presence of cardiac pacemaker: Secondary | ICD-10-CM | POA: Insufficient documentation

## 2022-12-01 DIAGNOSIS — G20A1 Parkinson's disease without dyskinesia, without mention of fluctuations: Secondary | ICD-10-CM | POA: Insufficient documentation

## 2022-12-01 DIAGNOSIS — R7881 Bacteremia: Secondary | ICD-10-CM | POA: Diagnosis present

## 2022-12-01 DIAGNOSIS — Z23 Encounter for immunization: Secondary | ICD-10-CM

## 2022-12-01 DIAGNOSIS — B953 Streptococcus pneumoniae as the cause of diseases classified elsewhere: Secondary | ICD-10-CM | POA: Insufficient documentation

## 2022-12-01 DIAGNOSIS — I2510 Atherosclerotic heart disease of native coronary artery without angina pectoris: Secondary | ICD-10-CM | POA: Insufficient documentation

## 2022-12-01 DIAGNOSIS — I4891 Unspecified atrial fibrillation: Secondary | ICD-10-CM | POA: Insufficient documentation

## 2022-12-01 NOTE — Patient Instructions (Addendum)
You are here for follow up of pneumococcus bacteremia You have completed ceftriaxone on 11/21/22 Today we will repeat Blood culture. We also gave you the vaccine Prevnar 20- ( which is a pneumococcal vaccine) Taught you some leg /fppt exercises to reduce the swelling legs

## 2022-12-01 NOTE — Progress Notes (Signed)
NAME: Matthew Howe  DOB: May 07, 1946  MRN: [id number]  Date/Time: 12/01/2022 11:53 AM  Subjective:  Here after recent hospitalization for pneumococcus bacteremia He was in [facility] between 11/04/22-11/10/22 ? Matthew Howe is a 77 y.o. with a history of Afib,CAD, pacemaker , parkinson disease was in [facility] for pneumococcus bacteremia the source of which was thought to be rt leg cellulitis- bacteremia was low grade 1of3, neg repeat culture,TEE on 11/09/22 showed a strand on the pacemaker lead . Dr.Wallace Infectious Disease who saw him while he was admitted had discussed with EP and they thought it was more a fibrin strand rather than a vegetation. So they decided to preserve the pacemaker and treat him with 2 weeks of Iv ceftriaxone (got a total of 17 days) and then repeat a blood culture off antibiotics- pt completed ceftriaxone on 11/21/22 and PICC has been removed He is here with his wife Wife says while on ceftriaxone he was out of it but now is doing much better . More energy , more alert= pt says he is feeling better now Swelling legs better   Past Medical History:  Diagnosis Date   Arthritis    Atrial fibrillation ([place])    Atrial fibrillation ([facility])    Carpal tunnel syndrome on both sides    CHF (congestive heart failure) ([place])    CKD (chronic kidney disease) 10/2018   recent diagnosis   Complete heart block ([place])    a. s/p St. Jude PPM 11/2014.   Complication of anesthesia    stayed overnight dt under too long d/t taking Parkinson's medication (2013?)   Coronary artery disease    GERD (gastroesophageal reflux disease)    "not as bad as it used to be"   Heart murmur    History of recent blood transfusion 07/2018   Hyperlipidemia    Hypertension    IBS (irritable bowel syndrome)    Lower extremity edema    Parkinson's disease    Presence of permanent cardiac pacemaker    St Jude   Shortness of breath dyspnea     Past Surgical History:  Procedure Laterality Date   APPENDECTOMY   12/2010   BACK SURGERY  08/06/2018   lower lumbarsacral region, has rods in place. (done at [facility])   Norco  11/20/2012   Known CAD with one vessel coronary disease of left descending artery by cardiac cath.    CARDIOVERSION N/A 11/21/2016   Procedure: CARDIOVERSION;  Surgeon: Deboraha Sprang, MD;  Location: [facility];  Service: Cardiovascular;  Laterality: N/A;   CARDIOVERSION N/A 03/08/2019   Procedure: CARDIOVERSION;  Surgeon: Fay Records, MD;  Location: [facility] ENDOSCOPY;  Service: Cardiovascular;  Laterality: N/A;   CARPAL TUNNEL RELEASE Right 10/22/2018   Procedure: CARPAL TUNNEL RELEASE;  Surgeon: Dereck Leep, MD;  Location: [facility];  Service: Orthopedics;  Laterality: Right;   CARPAL TUNNEL RELEASE Left 05/22/2019   Procedure: CARPAL TUNNEL RELEASE - LEFT;  Surgeon: Dereck Leep, MD;  Location: [facility];  Service: Orthopedics;  Laterality: Left;   COLONOSCOPY  2018   COLONOSCOPY N/A 10/21/2020   Procedure: COLONOSCOPY;  Surgeon: Robert Bellow, MD;  Location: [facility] ENDOSCOPY;  Service: Endoscopy;  Laterality: N/A;   HERNIA REPAIR  [id number]   umbilical hernia repair   INSERT / REPLACE / REMOVE PACEMAKER     PACEMAKER INSERTION  11-21-14   STJ Assurity dual chamber pacemaker implanted by Dr Rayann Heman for CHB   PERMANENT PACEMAKER INSERTION N/A 11/21/2014  Procedure: PERMANENT PACEMAKER INSERTION;  Surgeon: Thompson Grayer, MD;  Location: [facility] CATH LAB;  Service: Cardiovascular;  Laterality: N/A;   TEE WITHOUT CARDIOVERSION N/A 11/21/2016   Procedure: TRANSESOPHAGEAL ECHOCARDIOGRAM (TEE);  Surgeon: Deboraha Sprang, MD;  Location: [facility];  Service: Cardiovascular;  Laterality: N/A;   TEE WITHOUT CARDIOVERSION N/A 11/09/2022   Procedure: TRANSESOPHAGEAL ECHOCARDIOGRAM;  Surgeon: Kate Sable, MD;  Location: [facility];  Service: Cardiovascular;  Laterality: N/A;   UPPER GI ENDOSCOPY      Social History   Socioeconomic History   Marital status: Married    Spouse  name: Pat   Number of children: 1   Years of education: 14   Highest education level: Associate degree: academic program  Occupational History   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  Tobacco Use   Smoking status: Never   Smokeless tobacco: Never  Vaping Use   Vaping Use: Never used  Substance and Sexual Activity   Alcohol use: No    Alcohol/week: 0.0 standard drinks of alcohol   Drug use: No   Sexual activity: Never  Other Topics Concern   Not on file  Social History Narrative   Not on file   Social Determinants of Health   Financial Resource Strain: Low Risk  (10/26/2017)   Overall Financial Resource Strain (CARDIA)    Difficulty of Paying Living Expenses: Not hard at all  Food Insecurity: No Food Insecurity (10/26/2017)   Hunger Vital Sign    Worried About Running Out of Food in the Last Year: Never true    [place] in the Last Year: Never true  Transportation Needs: No Transportation Needs (10/26/2017)   PRAPARE - Hydrologist (Medical): No    Lack of Transportation (Non-Medical): No  Physical Activity: Insufficiently Active (10/26/2017)   Exercise Vital Sign    Days of Exercise per Week: 7 days    Minutes of Exercise per Session: 20 min  Stress: No Stress Concern Present (10/26/2017)   [place]    Feeling of Stress : Only a little  Social Connections: Moderately Integrated (10/26/2017)   Social Connection and Isolation Panel [NHANES]    Frequency of Communication with Friends and Family: More than three times a week    Frequency of Social Gatherings with Friends and Family: Never    Attends Religious Services: More than 4 times per year    Active Member of [employer] or Organizations: No    Attends Archivist Meetings: Never    Marital Status: Married  Human resources officer Violence: Not At Risk (10/26/2017)   Humiliation, Afraid, Rape, and Kick  questionnaire    Fear of Current or Ex-Partner: No    Emotionally Abused: No    Physically Abused: No    Sexually Abused: No    Family History  Problem Relation Age of Onset   Heart disease Mother    Heart disease Father    Colon cancer Father    Stroke Sister    Allergies  Allergen Reactions   Linaclotide Shortness Of Breath and Other (See Comments)   Codeine Diarrhea, Nausea And Vomiting and Other (See Comments)   Oxycodone Nausea And Vomiting   Terazosin Other (See Comments)    Dizziness / altered mental state  Dizziness / altered mental state  Dizziness / altered mental state    Gabapentin Other (See Comments)    dizziness Other reaction(s): Other (  See Comments) dizziness   Hydrocodone-Acetaminophen Diarrhea, Nausea And Vomiting and Other (See Comments)   Lubiprostone Other (See Comments)    Abdominal cramps Other reaction(s): Other (See Comments) Abdominal cramps Abdominal cramps   Norco [Hydrocodone-Acetaminophen] Diarrhea and Nausea And Vomiting   Omeprazole Diarrhea, Nausea And Vomiting and Other (See Comments)    Stomach cramps   Ropinirole Nausea Only and Other (See Comments)   I? Current Outpatient Medications  Medication Sig Dispense Refill   acetaminophen (TYLENOL) 500 MG tablet Take 500 mg by mouth every 6 (six) hours as needed (back pain.).      allopurinol (ZYLOPRIM) 100 MG tablet Take 100 mg by mouth daily.     allopurinol (ZYLOPRIM) 300 MG tablet Take 300 mg by mouth daily.  3   bisacodyl (DULCOLAX) 5 MG EC tablet Take 5 mg by mouth 2 (two) times daily.     carboxymethylcellulose (REFRESH PLUS) 0.5 % SOLN Place 1 drop into both eyes at bedtime.     carvedilol (COREG) 3.125 MG tablet Take 3.125 mg by mouth 2 (two) times daily.     desloratadine (CLARINEX) 5 MG tablet Take 5 mg by mouth at bedtime.      ELIQUIS 5 MG TABS tablet TAKE 1 TABLET BY MOUTH TWICE A DAY 180 tablet 1   fluticasone (FLONASE) 50 MCG/ACT nasal spray Place 2 sprays into the nose  daily as needed for allergies.      furosemide (LASIX) 20 MG tablet Take 3 tablets (60 mg total) by mouth daily. 30 tablet 1   lansoprazole (PREVACID) 15 MG capsule Take 15 mg by mouth daily as needed (indigestion).      magnesium oxide (MAG-OX) 400 MG tablet Take 400 mg by mouth at bedtime.     metolazone (ZAROXOLYN) 2.5 MG tablet TAKE 1 TABLET BY MOUTH DAILY AS NEEDED (ONCE DAILY AS NEEDED FOR WEIGHT GAIN OR SWELLING.). 90 tablet 3   Multiple Vitamins-Minerals (OCUVITE ADULT 50+ PO) Take 1 tablet by mouth daily.     oxybutynin (DITROPAN-XL) 5 MG 24 hr tablet Take 5 mg by mouth at bedtime.     polyethylene glycol (MIRALAX / GLYCOLAX) packet Take 17 g by mouth daily as needed (constipation.).      potassium chloride (KLOR-CON) 10 MEQ tablet TAKE 4 TABLETS (40 MEQ TOTAL) BY MOUTH IN THE MORNING. (Patient taking differently: Take 30 mEq by mouth in the morning.) 360 tablet 1   pramipexole (MIRAPEX) 1 MG tablet Take 1 mg by mouth 4 (four) times daily.     pregabalin (LYRICA) 25 MG capsule Take 25 mg by mouth every other day.     Red Yeast Rice 600 MG CAPS Take 600 mg by mouth daily.     sacubitril-valsartan (ENTRESTO) 24-26 MG Take 1 tablet by mouth 2 (two) times daily. 180 tablet 3   selegiline (ELDEPRYL) 5 MG capsule Take 5 mg by mouth 2 (two) times daily with a meal.      SENEXON-S 8.6-50 MG tablet Take 2 tablets by mouth 2 (two) times daily.     No current facility-administered medications for this visit.     Abtx:  Anti-infectives (From admission, onward)    None       REVIEW OF SYSTEMS:  Const: negative fever, negative chills, negative weight loss Eyes: negative diplopia or visual changes, negative eye pain ENT: negative coryza, negative sore throat Resp: negative cough, hemoptysis, dyspnea Cards: negative for chest pain, palpitations, ++ lower extremity edema GU: negative for frequency, dysuria and hematuria  GI: Negative for abdominal pain, diarrhea, bleeding,  constipation Skin: negative for rash and pruritus Heme: negative for easy bruising and gum/nose bleeding [id number] Neurolo:peripheral tremors Psych: negative for feelings of anxiety, depression  Endocrine: negative for thyroid, diabetes Allergy/Immunology-as above Objective:  VITALS:  BP 137/76   Pulse 70   Temp (!) 96.8 F (36 C) (Temporal)   PHYSICAL EXAM:  General: Alert, cooperative, no distress, in wheel chair  Head: Normocephalic, without obvious abnormality, atraumatic. Eyes: Conjunctivae clear, anicteric sclerae. Pupils are equal ENT Nares normal. No drainage or sinus tenderness. Lips, mucosa, and tongue normal. No Thrush Neck: Supple, symmetrical, no adenopathy, thyroid: non tender no carotid bruit and no JVD. Back: No CVA tenderness. Lungs: Clear to auscultation bilaterally. No Wheezing or Rhonchi. No rales. Heart: well controlled Abdomen: did not examine Extremities: edema legs rt>left Skin: No erythema Lymph: Cervical, supraclavicular normal. Neurologic: Grossly non-focal Pertinent Labs       Latest Ref Rng & Units 11/23/2022    3:29 PM 11/10/2022    6:10 AM 11/09/2022    3:59 AM  CMP  Glucose 70 - 99 mg/dL 105  108  100   BUN 8 - 23 mg/dL 30  40  47   Creatinine 0.61 - 1.24 mg/dL 1.25  1.64  1.63   Sodium 135 - 145 mmol/L 140  142  140   Potassium 3.5 - 5.1 mmol/L 4.5  4.1  4.2   Chloride 98 - 111 mmol/L 108  107  107   CO2 22 - 32 mmol/L '25  28  27   '$ Calcium 8.9 - 10.3 mg/dL 8.9  8.9  8.9      Impression/Recommendation ? ?Recent streptococcus pneumoniae bacteremia- treated with 17 days of Iv antibiotic. PIC line has been removed As he has pacemaker and TEE showed a strand on the pacemaker lead it was decided  Dr.Lambert and by ID Dr.Wallace to retain pacemaker as endocarditis was unlikely and check blood culture after completion of antibiotic Pt Is 10 days post antibiotic- will do blood culture today  Prevnar 20 vaccine given today  Cellulitis  rt leg resolved  Will inform patient of the results Discussed the management with patient and his wife ?

## 2022-12-02 MED ORDER — FUROSEMIDE 20 MG PO TABS: 60.0000 mg | ORAL_TABLET | Freq: Every day | ORAL | 1 refills | Status: DC

## 2022-12-02 NOTE — Telephone Encounter (Signed)
Will you please advise if this is the dose he should continue to take until seen again in office? Thank you!

## 2022-12-06 ENCOUNTER — Other Ambulatory Visit (HOSPITAL_COMMUNITY): Payer: Self-pay

## 2022-12-06 LAB — CULTURE, BLOOD (ROUTINE X 2)
Culture: NO GROWTH
Culture: NO GROWTH
Special Requests: ADEQUATE
Special Requests: ADEQUATE

## 2022-12-08 DIAGNOSIS — Z23 Encounter for immunization: Secondary | ICD-10-CM

## 2022-12-22 ENCOUNTER — Encounter: Payer: Self-pay | Admitting: Family

## 2022-12-22 ENCOUNTER — Ambulatory Visit (HOSPITAL_BASED_OUTPATIENT_CLINIC_OR_DEPARTMENT_OTHER): Payer: Medicare Other | Admitting: Family

## 2022-12-22 ENCOUNTER — Other Ambulatory Visit
Admission: RE | Admit: 2022-12-22 | Discharge: 2022-12-22 | Disposition: A | Discharge status: Home / Self Care | Payer: Medicare Other | Source: Ambulatory Visit | Attending: Family | Admitting: Family

## 2022-12-22 VITALS — BP 108/58 | HR 70 | Wt 259.4 lb

## 2022-12-22 DIAGNOSIS — I5022 Chronic systolic (congestive) heart failure: Secondary | ICD-10-CM | POA: Insufficient documentation

## 2022-12-22 DIAGNOSIS — G20A1 Parkinson's disease without dyskinesia, without mention of fluctuations: Secondary | ICD-10-CM | POA: Diagnosis not present

## 2022-12-22 DIAGNOSIS — I4819 Other persistent atrial fibrillation: Secondary | ICD-10-CM | POA: Diagnosis not present

## 2022-12-22 DIAGNOSIS — I10 Essential (primary) hypertension: Secondary | ICD-10-CM

## 2022-12-22 LAB — BASIC METABOLIC PANEL
Anion gap: 12 (ref 5–15)
BUN: 37 mg/dL — ABNORMAL HIGH (ref 8–23)
CO2: 23 mmol/L (ref 22–32)
Calcium: 8.8 mg/dL — ABNORMAL LOW (ref 8.9–10.3)
Chloride: 106 mmol/L (ref 98–111)
Creatinine, Ser: 1.63 mg/dL — ABNORMAL HIGH (ref 0.61–1.24)
GFR, Estimated: 44 mL/min — ABNORMAL LOW (ref >60)
Glucose, Bld: 121 mg/dL — ABNORMAL HIGH (ref 70–99)
Potassium: 4 mmol/L (ref 3.5–5.1)
Sodium: 141 mmol/L (ref 135–145)

## 2022-12-22 NOTE — Progress Notes (Signed)
Patient ID: Matthew Howe, male    DOB: 1946-09-16, 77 y.o.   MRN: [id number]  HPI This is a 77 y/o male with a history of parkinson's, HTN, hyperlipidemia, CKD, GERD, CAD, complete heart block (pacemaker), atrial fibrillation, AICD and chronic heart failure.   TEE 11/09/22 showed EF 40-45% along with vegetation attached to RA pacemaker lead and mild/moderate AR. Echo done 12/08/16 reviewed and shows an EF of 55-60% along with mild AR/MR. Previous echo was done 07/28/15 and showed an EF of 55-60% along with mild AR.   Had a cardiac catheterization done 11/20/12 and showed mild nonobstructive disease.  Was in the ED 11/11/22 due to PICC line being dislodged. PICC line replaced. Admitted 11/04/22 due to sepsis with acute respiratory failure. Blood cultures positive. Echo showed possible vegetation on pacemaker lead. Poor extraction candidate. PICC line placed for 2 weeks of antibiotics.    He presents today for a HF f/u visit with a chief complaint of moderate fatigue with minimal exertion. Chronic in nature. Has SOB, pedal edema, tremors, depression, chronic pain and difficulty sleeping along with this. Denies abdominal distention, palpitations, chest pain, dizziness, cough or weight gain.   Feels like his edema has improved since starting on entresto. Was able to walk up to the office today because there were no wheelchairs in the lobby.   Past Medical History:  Diagnosis Date   Arthritis    Atrial fibrillation [facility])    Atrial fibrillation (HCC)    Carpal tunnel syndrome on both sides    CHF (congestive heart failure) ([place])    CKD (chronic kidney disease) 10/2018   recent diagnosis   Complete heart block ([place])    a. s/p St. Jude PPM 11/2014.   Complication of anesthesia    stayed overnight dt under too long d/t taking Parkinson's medication (2013?)   Coronary artery disease    GERD (gastroesophageal reflux disease)    "not as bad as it used to be"   Heart murmur    History of recent blood  transfusion 07/2018   Hyperlipidemia    Hypertension    IBS (irritable bowel syndrome)    Lower extremity edema    Parkinson's disease    Presence of permanent cardiac pacemaker    St Jude   Shortness of breath dyspnea    Past Surgical History:  Procedure Laterality Date   APPENDECTOMY  12/2010   BACK SURGERY  08/06/2018   lower lumbarsacral region, has rods in place. (done at [facility])   [place]  11/20/2012   Known CAD with one vessel coronary disease of left descending artery by cardiac cath.    CARDIOVERSION N/A 11/21/2016   Procedure: CARDIOVERSION;  Surgeon: Deboraha Sprang, MD;  Location: [place];  Service: Cardiovascular;  Laterality: N/A;   CARDIOVERSION N/A 03/08/2019   Procedure: CARDIOVERSION;  Surgeon: Fay Records, MD;  Location: [facility] ENDOSCOPY;  Service: Cardiovascular;  Laterality: N/A;   CARPAL TUNNEL RELEASE Right 10/22/2018   Procedure: CARPAL TUNNEL RELEASE;  Surgeon: Dereck Leep, MD;  Location: [facility];  Service: Orthopedics;  Laterality: Right;   CARPAL TUNNEL RELEASE Left 05/22/2019   Procedure: CARPAL TUNNEL RELEASE - LEFT;  Surgeon: Dereck Leep, MD;  Location: [facility];  Service: Orthopedics;  Laterality: Left;   COLONOSCOPY  2018   COLONOSCOPY N/A 10/21/2020   Procedure: COLONOSCOPY;  Surgeon: Robert Bellow, MD;  Location: [facility] ENDOSCOPY;  Service: Endoscopy;  Laterality: N/A;   HERNIA REPAIR  10/23/2019  umbilical hernia repair   INSERT / REPLACE / REMOVE PACEMAKER     PACEMAKER INSERTION  11-21-14   STJ Assurity dual chamber pacemaker implanted by Dr Rayann Heman for CHB   PERMANENT PACEMAKER INSERTION N/A 11/21/2014   Procedure: PERMANENT PACEMAKER INSERTION;  Surgeon: Thompson Grayer, MD;  Location: [facility] CATH LAB;  Service: Cardiovascular;  Laterality: N/A;   TEE WITHOUT CARDIOVERSION N/A 11/21/2016   Procedure: TRANSESOPHAGEAL ECHOCARDIOGRAM (TEE);  Surgeon: Deboraha Sprang, MD;  Location: [facility];  Service: Cardiovascular;  Laterality:  N/A;   TEE WITHOUT CARDIOVERSION N/A 11/09/2022   Procedure: TRANSESOPHAGEAL ECHOCARDIOGRAM;  Surgeon: Kate Sable, MD;  Location: [facility];  Service: Cardiovascular;  Laterality: N/A;   UPPER GI ENDOSCOPY     Family History  Problem Relation Age of Onset   Heart disease Mother    Heart disease Father    Colon cancer Father    Stroke Sister    Social History   Tobacco Use   Smoking status: Never   Smokeless tobacco: Never  Substance Use Topics   Alcohol use: No    Alcohol/week: 0.0 standard drinks of alcohol   Allergies  Allergen Reactions   Linaclotide Shortness Of Breath and Other (See Comments)   Codeine Diarrhea, Nausea And Vomiting and Other (See Comments)   Oxycodone Nausea And Vomiting   Terazosin Other (See Comments)    Dizziness / altered mental state  Dizziness / altered mental state  Dizziness / altered mental state    Gabapentin Other (See Comments)    dizziness Other reaction(s): Other (See Comments) dizziness   Hydrocodone-Acetaminophen Diarrhea, Nausea And Vomiting and Other (See Comments)   Lubiprostone Other (See Comments)    Abdominal cramps Other reaction(s): Other (See Comments) Abdominal cramps Abdominal cramps   Norco [Hydrocodone-Acetaminophen] Diarrhea and Nausea And Vomiting   Omeprazole Diarrhea, Nausea And Vomiting and Other (See Comments)    Stomach cramps   Ropinirole Nausea Only and Other (See Comments)   Prior to Admission medications   Medication Sig Start Date End Date Taking? Authorizing Provider  acetaminophen (TYLENOL) 500 MG tablet Take 500 mg by mouth every 6 (six) hours as needed (back pain.).    Yes [provider]  allopurinol (ZYLOPRIM) 100 MG tablet Take 100 mg by mouth daily.   Yes [provider]  allopurinol (ZYLOPRIM) 300 MG tablet Take 300 mg by mouth daily. 07/09/18  Yes [provider]  bisacodyl (DULCOLAX) 5 MG EC tablet Take 5 mg by mouth 2 (two) times daily.   Yes [provider]  carboxymethylcellulose (REFRESH PLUS) 0.5 % SOLN Place 1 drop into both eyes at bedtime.   Yes [provider]  carvedilol (COREG) 3.125 MG tablet Take 3.125 mg by mouth 2 (two) times daily.   Yes [provider]  desloratadine (CLARINEX) 5 MG tablet Take 5 mg by mouth at bedtime.    Yes [provider]  ELIQUIS 5 MG TABS tablet TAKE 1 TABLET BY MOUTH TWICE A DAY 11/22/22  Yes Gollan, Kathlene November, MD  FLUoxetine (PROZAC) 10 MG capsule Take 10 mg by mouth at bedtime.   Yes [provider]  fluticasone (FLONASE) 50 MCG/ACT nasal spray Place 2 sprays into the nose daily as needed for allergies.    Yes [provider]  furosemide (LASIX) 20 MG tablet Take 3 tablets (60 mg total) by mouth daily. 12/02/22  Yes Minna Merritts, MD  lansoprazole (PREVACID) 15 MG capsule Take 15 mg by mouth daily as needed (  indigestion).    Yes [provider]  magnesium oxide (MAG-OX) 400 MG tablet Take 400 mg by mouth at bedtime.   Yes [provider]  metolazone (ZAROXOLYN) 2.5 MG tablet TAKE 1 TABLET BY MOUTH DAILY AS NEEDED (ONCE DAILY AS NEEDED FOR WEIGHT GAIN OR SWELLING.). 06/22/21  Yes Gollan, Kathlene November, MD  oxybutynin (DITROPAN-XL) 5 MG 24 hr tablet Take 5 mg by mouth at bedtime. 09/28/22 09/28/23 Yes [provider]  potassium chloride (KLOR-CON) 10 MEQ tablet TAKE 4 TABLETS (40 MEQ TOTAL) BY MOUTH IN THE MORNING. Patient taking differently: Take 30 mEq by mouth in the morning. 10/11/22  Yes Gollan, Kathlene November, MD  pramipexole (MIRAPEX) 1 MG tablet Take 1 mg by mouth 4 (four) times daily. 04/09/21  Yes Anabel Bene, MD  pregabalin (LYRICA) 25 MG capsule Take 25 mg by mouth every other day. 09/29/22  Yes [provider]  Red Yeast Rice 600 MG CAPS Take 600 mg by mouth daily.   Yes [provider]  sacubitril-valsartan (ENTRESTO) 24-26 MG Take 1 tablet by mouth 2 (two) times daily. 11/23/22  Yes Mlissa Tamayo, Otila Kluver A, FNP   selegiline (ELDEPRYL) 5 MG capsule Take 5 mg by mouth 2 (two) times daily with a meal.    Yes [provider]  SENEXON-S 8.6-50 MG tablet Take 2 tablets by mouth 2 (two) times daily. 09/15/19  Yes [provider]  Multiple Vitamins-Minerals (OCUVITE ADULT 50+ PO) Take 1 tablet by mouth daily.    [provider]  polyethylene glycol (MIRALAX / GLYCOLAX) packet Take 17 g by mouth daily as needed (constipation.).     [provider]   Review of Systems  Constitutional:  Positive for fatigue. Negative for appetite change.  HENT:  Negative for congestion, rhinorrhea and sore throat.   Eyes: Negative.   Respiratory:  Positive for shortness of breath. Negative for chest tightness.   Cardiovascular:  Positive for leg swelling (improving). Negative for chest pain and palpitations.  Gastrointestinal:  Negative for abdominal distention and abdominal pain.  Endocrine: Negative.   Genitourinary: Negative.   Musculoskeletal:  Positive for arthralgias (joints ache) and back pain. Negative for neck pain.  Skin: Negative.   Allergic/Immunologic: Negative.   Neurological:  Positive for tremors and weakness. Negative for dizziness and light-headedness.  Hematological:  Negative for adenopathy. Does not bruise/bleed easily.  Psychiatric/Behavioral:  Positive for dysphoric mood and sleep disturbance (sleeping on 2 pillows; sometimes restless sleep). The patient is not nervous/anxious.    Vitals:   12/22/22 1353  BP: (!) 108/58  Pulse: 70  SpO2: 97%  Weight: 259 lb 6.4 oz (117.7 kg)   Wt Readings from Last 3 Encounters:  12/22/22 259 lb 6.4 oz (117.7 kg)  11/23/22 256 lb 2 oz (116.2 kg)  11/11/22 244 lb 11.4 oz (111 kg)   Lab Results  Component Value Date   CREATININE 1.63 (H) 12/22/2022   CREATININE 1.25 (H) 11/23/2022   CREATININE 1.64 (H) 11/10/2022    Physical Exam Vitals and nursing note reviewed. Exam conducted with a chaperone present (wife).   Constitutional:      Appearance: He is well-developed.  HENT:     Head: Normocephalic and atraumatic.  Eyes:     Conjunctiva/sclera: Conjunctivae normal.     Pupils: Pupils are equal, round, and reactive to light.  Neck:     Vascular: No JVD.  Cardiovascular:     Rate and Rhythm: Normal rate and regular rhythm.  Pulmonary:  Effort: Pulmonary effort is normal.     Breath sounds: No wheezing or rales.  Abdominal:     General: There is no distension.     Palpations: Abdomen is soft.     Tenderness: There is no abdominal tenderness.  Musculoskeletal:        General: No tenderness.     Cervical back: Normal range of motion and neck supple.     Right lower leg: Edema (trace pitting) present.     Left lower leg: Edema (trace pitting) present.  Skin:    General: Skin is warm and dry.  Neurological:     Mental Status: He is alert and oriented to person, place, and time.     Gait: Gait abnormal.  Psychiatric:        Mood and Affect: Mood is depressed.        Behavior: Behavior normal.        Thought Content: Thought content normal.    Assessment & Plan:  1: Chronic heart failure with reduced ejection fraction- - NYHA class III - euvolemic - weighing daily; Reminded to call for an overnight weight gain of >2 pounds or a weekly weight gain of >5 pounds - weight up 3 pound since last visit here 1 month ago - TEE 11/09/22: EF 40-45% along with vegetation attached to RA pacemaker lead and mild/moderate AR. Echo 12/08/16: EF of 55-60% along with mild AR/MR. Previous echo was done 07/28/15 and showed an EF of 55-60% along with mild AR.  - cardiac catheterization done 11/20/12 and showed mild nonobstructive disease. - not adding salt to his food and wife is diligent about reading food labels - carvedilol 3.125mg  BID - entresto 24/26mg  BID - metolazone 2.5mg  PRN - potassium 69meq daily - furosemide 60mg  daily - BMP today - saw cardiologist Rockey Situ) 08/01/22 - drinking 64 ounces of  fluid daily - BNP 11/04/22 was 1216.0 - receiving PT twice/ week  2: HTN- - BP  - saw PCP Ola Spurr) 12/16/22 - BMP from 11/23/22 reviewed and showed sodium 140, potassium 4.5, creatinine 1.25  and GFR >60 - saw nephrology Candiss Norse) 09/05/22  3: Atrial fibrillation- - cardioversion done 11/21/16   - saw EP Caryl Comes) 08/30/22 - apixaban 5mg  BID  4: Parkinson's Disease- - continues to have tremors - saw neurologist Melrose Nakayama) 09/29/22   Return in 2 months, sooner if needed.   BMP results returned prior to note closure. Renal function has declined some since starting entresto but within his normal range. Plan to recheck this at next visit if not done elsewhere.

## 2022-12-23 ENCOUNTER — Encounter: Payer: Self-pay | Admitting: Family

## 2022-12-25 ENCOUNTER — Other Ambulatory Visit: Payer: Self-pay | Admitting: Cardiovascular Disease

## 2023-01-02 ENCOUNTER — Other Ambulatory Visit: Payer: Self-pay | Admitting: Cardiovascular Disease

## 2023-01-30 NOTE — Progress Notes (Unsigned)
Cardiology Office Note  Date:  01/31/2023   ID:  Matthew Howe, Matthew Howe [birth year], MRN [id number]  PCP:  Mick Sell, MD   Chief Complaint  Patient presents with   Follow-up    Patient had recent hospital admission in 10/2022.      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  Found to have atrial fibrillation 11/22/2014 on pacemaker download, started on anticoagulation    hospitalization for paroxysmal atrial fibrillation/acute on chronic diastolic CHF Echocardiogram January 2024 EF 40 to 45% He presents today for weight gain, shortness of breath, leg edema, chronic diastolic CHF,Paroxysmal atrial fibrillation  Last seen by myself in clinic October 2023  Hospitalized January 2024 sepsis, cellulitis, strep pneumo bacteremia Ejection fraction 40% Started on Entresto, Coreg, Lasix 60 daily, metolazone as needed Flecainide held in the setting of cardiomyopathy Continue on carvedilol, Eliquis Transesophageal echo completed, lead extraction not performed given comorbidities Treated with IV antibiotics through PICC line  Completed 2 weeks ABX Has had follow-up in CHF clinic Working with PT, slow recovery, slower recovery secondary to Parkinson's Leg swelling has persisted  Weight relatively stable at home 250 to 255 Taking Lasix 60 daily Sedentary, legs down Has new sleeper chair, wears compression hose  Was previously on torsemide prior to hospitalization, felt this worked better, requesting to switch back.  Was sometimes taking up to 4 torsemide a day  Reports he is asymptomatic from his atrial fibs/flutter  EKG personally reviewed by myself on todays visit Paced rhythm underlying fib flutter  Prior arrhythmia history Persistent atrial fib since 08/2018  noted on pacer download Cardioversion for atrial fib 03/08/2019 seen by Dr. Graciela Husbands 11/2018 , maintaining normal sinus rhythm on flecainide Flecainide held January 2024 Back in atrial flutter on EKG January 31, 2023  Recovered from hernia surgery January 2021  Echo 03/15/2019 ejection fraction of 50-55%.   presented to the hospital 07/28/2015 with abdominal bloating, shortness of breath Was treated with diureticswith improvement of his symptoms    leads a busy lifestyle, continues to work most days of the week. Fatigue, tremor Followed by neurology, Dr. Malvin Johns for Parkinson's Previous issues with constipation   He presented to [facility] with bradycardia February 2016. This seemed to improve without intervention and he is asymptomatic. He had recurrent symptoms and we referred him to [facility] for further evaluation. He was seen in the emergency room and found to be in heart block, pacemaker placed urgently    Previous Stress echo leading to cardiac catheterization at [facility].   Stress echo was a limited study, ejection fraction estimated at 45% with EF of 50% at peak stress.   Cardiac catheterization 11/20/2012 with 25% proximal LAD disease, followed by 40% lesion. The cardiac catheterization note makes mention of atrial flutter though arrhythmia is not mentioned in any of the clinic notes.   PMH:   has a past medical history of Arthritis, Atrial fibrillation, Atrial fibrillation, Carpal tunnel syndrome on both sides, CHF (congestive heart failure), CKD (chronic kidney disease) (10/2018), Complete heart block, Complication of anesthesia, Coronary artery disease, GERD (gastroesophageal reflux disease), Heart murmur, History of recent blood transfusion (07/2018), Hyperlipidemia, Hypertension, IBS (irritable bowel syndrome), Lower extremity edema, Parkinson's disease, Presence of permanent cardiac pacemaker, and Shortness of breath dyspnea.  PSH:    Past Surgical History:  Procedure Laterality  Date   APPENDECTOMY  12/2010   BACK SURGERY  08/06/2018   lower lumbarsacral region, has rods in place. (done at [facility])   CARDIAC CATHETERIZATION  11/20/2012   Known CAD with one vessel coronary disease of left descending artery by cardiac cath.    CARDIOVERSION N/A 11/21/2016   Procedure: CARDIOVERSION;  Surgeon: Duke Salvia, MD;  Location: [facility] ORS;  Service: Cardiovascular;  Laterality: N/A;   CARDIOVERSION N/A 03/08/2019   Procedure: CARDIOVERSION;  Surgeon: Pricilla Riffle, MD;  Location: [facility] ENDOSCOPY;  Service: Cardiovascular;  Laterality: N/A;   CARPAL TUNNEL RELEASE Right 10/22/2018   Procedure: CARPAL TUNNEL RELEASE;  Surgeon: Donato Heinz, MD;  Location: [facility] ORS;  Service: Orthopedics;  Laterality: Right;   CARPAL TUNNEL RELEASE Left 05/22/2019   Procedure: CARPAL TUNNEL RELEASE - LEFT;  Surgeon: Donato Heinz, MD;  Location: [facility] ORS;  Service: Orthopedics;  Laterality: Left;   COLONOSCOPY  2018   COLONOSCOPY N/A 10/21/2020   Procedure: COLONOSCOPY;  Surgeon: Earline Mayotte, MD;  Location: [facility] ENDOSCOPY;  Service: Endoscopy;  Laterality: N/A;   HERNIA REPAIR  10/23/2019   umbilical hernia repair   INSERT / REPLACE / REMOVE PACEMAKER     PACEMAKER INSERTION  11-21-14   STJ Assurity dual chamber pacemaker implanted by Dr Johney Frame for CHB   PERMANENT PACEMAKER INSERTION N/A 11/21/2014   Procedure: PERMANENT PACEMAKER INSERTION;  Surgeon: Hillis Range, MD;  Location: [facility] CATH LAB;  Service: Cardiovascular;  Laterality: N/A;   TEE WITHOUT CARDIOVERSION N/A 11/21/2016   Procedure: TRANSESOPHAGEAL ECHOCARDIOGRAM (TEE);  Surgeon: Duke Salvia, MD;  Location: [facility] ORS;  Service: Cardiovascular;  Laterality: N/A;   TEE WITHOUT CARDIOVERSION N/A 11/09/2022   Procedure: TRANSESOPHAGEAL ECHOCARDIOGRAM;  Surgeon: Debbe Odea, MD;  Location: [facility] ORS;  Service: Cardiovascular;  Laterality: N/A;   UPPER GI ENDOSCOPY      Current Outpatient Medications  Medication Sig Dispense  Refill   acetaminophen (TYLENOL) 500 MG tablet Take 500 mg by mouth every 6 (six) hours as needed (back pain.).      allopurinol (ZYLOPRIM) 100 MG tablet Take 100 mg by mouth daily.     allopurinol (ZYLOPRIM) 300 MG tablet Take 300 mg by mouth daily.  3   bisacodyl (DULCOLAX) 5 MG EC tablet Take 5 mg by mouth 2 (two) times daily.     carboxymethylcellulose (REFRESH PLUS) 0.5 % SOLN Place 1 drop into both eyes at bedtime.     carvedilol (COREG) 3.125 MG tablet Take 3.125 mg by mouth 2 (two) times daily.     desloratadine (CLARINEX) 5 MG tablet Take 5 mg by mouth at bedtime.      ELIQUIS 5 MG TABS tablet TAKE 1 TABLET BY MOUTH TWICE A DAY 180 tablet 1   FLUoxetine (PROZAC) 10 MG capsule Take 10 mg by mouth daily.     fluticasone (FLONASE) 50 MCG/ACT nasal spray Place 2 sprays into the nose daily as needed for allergies.      furosemide (LASIX) 20 MG tablet TAKE 3 TABLETS BY MOUTH EVERY DAY 270 tablet 0   lansoprazole (PREVACID) 15 MG capsule Take 15 mg by mouth daily as needed (indigestion).      magnesium oxide (MAG-OX) 400 MG tablet Take 400 mg by mouth at bedtime.     metolazone (ZAROXOLYN) 2.5 MG tablet TAKE 1 TABLET BY MOUTH DAILY AS NEEDED (ONCE DAILY AS NEEDED FOR WEIGHT GAIN OR SWELLING.). 90 tablet 3   Multiple Vitamins-Minerals (OCUVITE  ADULT 50+ PO) Take 1 tablet by mouth daily.     oxybutynin (DITROPAN-XL) 5 MG 24 hr tablet Take 5 mg by mouth at bedtime.     polyethylene glycol (MIRALAX / GLYCOLAX) packet Take 17 g by mouth daily as needed (constipation.).      potassium chloride (KLOR-CON M) 10 MEQ tablet Take 20 mEq by mouth once. 2 tabs of 10 meq     pramipexole (MIRAPEX) 1 MG tablet Take 1 mg by mouth 4 (four) times daily.     pregabalin (LYRICA) 25 MG capsule Take 25 mg by mouth every other day.     Red Yeast Rice 600 MG CAPS Take 600 mg by mouth daily.     sacubitril-valsartan (ENTRESTO) 24-26 MG Take 1 tablet by mouth 2 (two) times daily. 180 tablet 3   selegiline (ELDEPRYL) 5  MG capsule Take 5 mg by mouth 2 (two) times daily with a meal.      SENEXON-S 8.6-50 MG tablet Take 2 tablets by mouth 2 (two) times daily.     No current facility-administered medications for this visit.     Allergies:   Linaclotide, Codeine, Oxycodone, Terazosin, Gabapentin, Hydrocodone-acetaminophen, Lubiprostone, Norco [hydrocodone-acetaminophen], Omeprazole, and Ropinirole   Social History:  The patient  reports that he has never smoked. He has never used smokeless tobacco. He reports that he does not drink alcohol and does not use drugs.   Family History:   family history includes Colon cancer in his father; Heart disease in his father and mother; Stroke in his sister.    Review of Systems: Review of Systems  Constitutional: Negative.   HENT: Negative.    Respiratory:  Positive for shortness of breath.   Cardiovascular:  Positive for leg swelling.  Gastrointestinal: Negative.   Musculoskeletal: Negative.        Leg weakness  Neurological: Negative.   Psychiatric/Behavioral: Negative.    All other systems reviewed and are negative.   PHYSICAL EXAM: VS:  BP 120/64 (BP Location: Left Arm, Patient Position: Sitting, Cuff Size: Large)   Pulse 70   Ht 6' (1.829 m)   Wt 244 lb 8 oz (110.9 kg)   SpO2 97%   BMI 33.16 kg/m  , BMI Body mass index is 33.16 kg/m. Constitutional:  oriented to person, place, and time. No distress.  HENT:  Head: Grossly normal Eyes:  no discharge. No scleral icterus.  Neck: No JVD, no carotid bruits  Cardiovascular: Regular rate and rhythm, no murmurs appreciated Pulmonary/Chest: Clear to auscultation bilaterally, no wheezes or rails Abdominal: Soft.  no distension.  no tenderness.  Musculoskeletal: Normal range of motion Neurological:  normal muscle tone. Coordination normal. No atrophy Skin: Skin warm and dry Psychiatric: normal affect, pleasant  Recent Labs: 11/04/2022: ALT 18; B Natriuretic Peptide 1,216.0 11/10/2022: Hemoglobin 14.6;  Platelets 261 12/22/2022: BUN 37; Creatinine, Ser 1.63; Potassium 4.0; Sodium 141    Lipid Panel Lab Results  Component Value Date   CHOL 188 03/23/2016   HDL 46 03/23/2016   LDLCALC 114 (H) 03/23/2016   TRIG 138 03/23/2016      Wt Readings from Last 3 Encounters:  01/31/23 244 lb 8 oz (110.9 kg)  12/22/22 259 lb 6.4 oz (117.7 kg)  11/23/22 256 lb 2 oz (116.2 kg)      ASSESSMENT AND PLAN:  Essential hypertension -  Blood pressure is well controlled on today's visit. No changes made to the medications.  Paroxysmal atrial fibrillation [facility]) - Successful cardioversion May 2020  off  flecainide 100 twice daily  Remains on Coreg 6.25 twice daily Paced rhythm with underlying flutter noted on today's visit Has follow-up with Dr. Graciela Husbands in 2 days time Consider cardioversion, may need to consider antiarrhythmic such as amiodarone given he is off flecainide in the setting of cardiomyopathy  Coronary artery disease involving native coronary artery of native heart without angina pectoris Currently with no symptoms of angina. No further workup at this time. Continue current medication regimen.  Complete heart block (HCC) Pacemaker in place, followed by Dr. Graciela Husbands  Mixed hyperlipidemia Low carbohydrate diet recommended  Constipation Likely related to his Parkinson's disease and dehydration, Stable on senna and Dulcolax  Chronic diastolic and systolic heart failure (HCC) Prefers to be on torsemide, we will hold Lasix and changed back to torsemide 40 daily Extra 40 torsemide after lunch for weight 258 or higher followed by nephrology,  Continue Sherryll Burger, Coreg Consider Farxiga in follow-up  Parkinson's disease (HCC) Stable Followed by  Dr. Malvin Johns Difficulty recovering after recent hospitalization for sepsis  Morbid obesity (HCC) Low carbohydrate recommended, exercise limited  Shortness of breath Stable weight 250 up to 255 pounds Deconditioning playing a role, now with  atrial flutter   Total encounter time more than 40 minutes  Greater than 50% was spent in counseling and coordination of care with the patient   Orders Placed This Encounter  Procedures   EKG 12-Lead      Signed, Dossie Arbour, M.D., Ph.D. 01/31/2023  [facility] [place], [place] [phone]

## 2023-01-31 ENCOUNTER — Encounter: Payer: Self-pay | Admitting: Cardiovascular Disease

## 2023-01-31 ENCOUNTER — Ambulatory Visit: Payer: Medicare Other | Attending: Cardiovascular Disease | Admitting: Cardiovascular Disease

## 2023-01-31 ENCOUNTER — Ambulatory Visit (INDEPENDENT_AMBULATORY_CARE_PROVIDER_SITE_OTHER): Payer: Medicare Other

## 2023-01-31 VITALS — BP 120/64 | HR 70 | Ht 72.0 in | Wt 244.5 lb

## 2023-01-31 DIAGNOSIS — I4819 Other persistent atrial fibrillation: Secondary | ICD-10-CM | POA: Insufficient documentation

## 2023-01-31 DIAGNOSIS — I442 Atrioventricular block, complete: Secondary | ICD-10-CM | POA: Diagnosis present

## 2023-01-31 DIAGNOSIS — I5022 Chronic systolic (congestive) heart failure: Secondary | ICD-10-CM | POA: Diagnosis present

## 2023-01-31 DIAGNOSIS — I10 Essential (primary) hypertension: Secondary | ICD-10-CM | POA: Diagnosis present

## 2023-01-31 DIAGNOSIS — Z950 Presence of cardiac pacemaker: Secondary | ICD-10-CM | POA: Diagnosis present

## 2023-01-31 DIAGNOSIS — G20A1 Parkinson's disease without dyskinesia, without mention of fluctuations: Secondary | ICD-10-CM | POA: Diagnosis present

## 2023-01-31 MED ORDER — POTASSIUM CHLORIDE CRYS ER 10 MEQ PO TBCR: 20.0000 meq | EXTENDED_RELEASE_TABLET | Freq: Every day | ORAL | 3 refills | Status: DC

## 2023-01-31 MED ORDER — TORSEMIDE 40 MG PO TABS: 40.0000 mg | ORAL_TABLET | Freq: Once | ORAL | 3 refills | Status: DC

## 2023-01-31 NOTE — Patient Instructions (Addendum)
Medication Instructions:  Please stop taking lasix. Start torsemide 40 mg daily, with 2 potassium a day Take extra torsemide 40 after lunch with potassium for weight >258  If you need a refill on your cardiac medications before your next appointment, please call your pharmacy.   Lab work: No new labs needed  Testing/Procedures: No new testing needed  Follow-Up: At [facility], you and your health needs are our priority.  As part of our continuing mission to provide you with exceptional heart care, we have created designated Provider Care Teams.  These Care Teams include your primary Cardiologist (physician) and Advanced Practice Providers (APPs -  Physician Assistants and Nurse Practitioners) who all work together to provide you with the care you need, when you need it.  You will need a follow up appointment in 3 months  Providers on your designated Care Team:   Nicolasa Ducking, NP Eula Listen, PA-C Cadence Fransico Michael, [place]  COVID-19 Vaccine Information can be found at: [email or web address] For questions related to vaccine distribution or appointments, please email [email or web address] or call [phone].

## 2023-02-01 LAB — CUP PACEART REMOTE DEVICE CHECK
Battery Remaining Longevity: 14 mo
Battery Remaining Percentage: 12 %
Battery Voltage: 2.86 V
Brady Statistic AP VP Percent: 99 %
Brady Statistic AP VS Percent: 1 %
Brady Statistic AS VP Percent: 1 %
Brady Statistic AS VS Percent: 1 %
Brady Statistic RA Percent Paced: 44 %
Brady Statistic RV Percent Paced: 99 %
Date Time Interrogation Session: 20240423020014
Implantable Lead Connection Status: 753985
Implantable Lead Connection Status: 753985
Implantable Lead Implant Date: 20160212
Implantable Lead Implant Date: 20160212
Implantable Lead Location: 753859
Implantable Lead Location: 753860
Implantable Lead Model: 1948
Implantable Pulse Generator Implant Date: 20160212
Lead Channel Impedance Value: 490 Ohm
Lead Channel Impedance Value: 580 Ohm
Lead Channel Pacing Threshold Amplitude: 0.625 V
Lead Channel Pacing Threshold Amplitude: 1.125 V
Lead Channel Pacing Threshold Pulse Width: 0.4 ms
Lead Channel Pacing Threshold Pulse Width: 0.4 ms
Lead Channel Sensing Intrinsic Amplitude: 12 mV
Lead Channel Sensing Intrinsic Amplitude: 2.4 mV
Lead Channel Setting Pacing Amplitude: 1.375
Lead Channel Setting Pacing Amplitude: 1.625
Lead Channel Setting Pacing Pulse Width: 0.4 ms
Lead Channel Setting Sensing Sensitivity: 4 mV
Pulse Gen Model: 2240
Pulse Gen Serial Number: 3049931

## 2023-02-02 ENCOUNTER — Telehealth: Payer: Self-pay | Admitting: Internal Medicine

## 2023-02-02 ENCOUNTER — Ambulatory Visit: Payer: Medicare Other | Attending: Internal Medicine | Admitting: Internal Medicine

## 2023-02-02 ENCOUNTER — Encounter: Payer: Self-pay | Admitting: Internal Medicine

## 2023-02-02 VITALS — BP 130/72 | HR 70 | Ht 72.0 in | Wt 254.4 lb

## 2023-02-02 DIAGNOSIS — I442 Atrioventricular block, complete: Secondary | ICD-10-CM | POA: Diagnosis present

## 2023-02-02 DIAGNOSIS — Z950 Presence of cardiac pacemaker: Secondary | ICD-10-CM

## 2023-02-02 DIAGNOSIS — Z01812 Encounter for preprocedural laboratory examination: Secondary | ICD-10-CM | POA: Diagnosis present

## 2023-02-02 DIAGNOSIS — I5032 Chronic diastolic (congestive) heart failure: Secondary | ICD-10-CM | POA: Diagnosis present

## 2023-02-02 DIAGNOSIS — I77810 Thoracic aortic ectasia: Secondary | ICD-10-CM | POA: Diagnosis present

## 2023-02-02 DIAGNOSIS — I4819 Other persistent atrial fibrillation: Secondary | ICD-10-CM

## 2023-02-02 LAB — PACEMAKER DEVICE OBSERVATION

## 2023-02-02 NOTE — Progress Notes (Signed)
Patient Care Team: Mick Sell, MD as PCP - General (Infectious Diseases) Antonieta Iba, MD as PCP - Cardiology (Cardiology) Duke Salvia, MD as PCP - Electrophysiology (Cardiology) Delma Freeze, FNP as Nurse Practitioner (Cardiology) Antonieta Iba, MD as Consulting Physician (Cardiology) Morene Crocker, MD as Referring Physician (Neurology)   HPI  Matthew Howe is a 77 y.o. male Seen following pacemaker implantation [employer] 2/16 by Dr. Fawn Kirk . He had presented to [facility] with chest pain and shortness of breath. He was found to be in complete heart block.  History of atrial fibrillation on flecainide with anticoagulation with apixaban;  flecainide stopped 2/24 in the setting of an admission for sepsis and respiratory failure.  He underwent TEE because of streptococcal pneumoniae bacteremia.?  To cellulitis.  Concern for vegetation on the RA lead but a fibrionous strand with low specificity for vegetation.  Outpatient antibiotics were recommended, repeat cultures were negative.  Extraction was not pursued because of comorbidities  No chest pain but shortness of breath, significant peripheral edema.  Sleeps in a bed ended up in a chair, probably some orthopnea and nocturnal dyspnea.  No palpitations.  Saw Dr. Knute Neu a few days ago who changed her from furosemide--torsemide; urinary output was a little bit more brisk but not torrential  Symptoms worsened considerably in January.  As noted above developed sepsis around that time.  Remains on apixaban     Catheterization 2/14 had demonstrated mild nonobstructive disease. DATE TEST EF   10/16 echo   55-60 %   3/18 Echo   55-60 %   6/20 Echo  55-60  Asc Ao 3.9 LVH mild LAE (-/53/38)  1/24 Echo  40-45%       Date Cr K Hgb  2/18 1.36   14.7  1/19  0.38 4.5 13.3  3/19     12.4  12/19 2.29 4.4 10.7  5/20 2.0 4.3 13.2  8/20 2.3    12/20 1.53 4.4 13.8  6/21 1.6 4.4 14.6  6/22 1.7 4.6 14.2  3/24 1.63 4.0 14.6           Past Medical History:  Diagnosis Date   Arthritis    Atrial fibrillation    Atrial fibrillation    Carpal tunnel syndrome on both sides    CHF (congestive heart failure)    CKD (chronic kidney disease) 10/2018   recent diagnosis   Complete heart block    a. s/p St. Jude PPM 11/2014.   Complication of anesthesia    stayed overnight dt under too long d/t taking Parkinson's medication (2013?)   Coronary artery disease    GERD (gastroesophageal reflux disease)    "not as bad as it used to be"   Heart murmur    History of recent blood transfusion 07/2018   Hyperlipidemia    Hypertension    IBS (irritable bowel syndrome)    Lower extremity edema    Parkinson's disease    Presence of permanent cardiac pacemaker    St Jude   Shortness of breath dyspnea     Past Surgical History:  Procedure Laterality Date   APPENDECTOMY  12/2010   BACK SURGERY  08/06/2018   lower lumbarsacral region, has rods in place. (done at [facility])   CARDIAC CATHETERIZATION  11/20/2012   Known CAD with one vessel coronary disease of left descending artery by cardiac cath.    CARDIOVERSION N/A 11/21/2016   Procedure: CARDIOVERSION;  Surgeon: Viviann Spare  Anabel Halon, MD;  Location: [facility] ORS;  Service: Cardiovascular;  Laterality: N/A;   CARDIOVERSION N/A 03/08/2019   Procedure: CARDIOVERSION;  Surgeon: Pricilla Riffle, MD;  Location: [facility] ENDOSCOPY;  Service: Cardiovascular;  Laterality: N/A;   CARPAL TUNNEL RELEASE Right 10/22/2018   Procedure: CARPAL TUNNEL RELEASE;  Surgeon: Donato Heinz, MD;  Location: [facility] ORS;  Service: Orthopedics;  Laterality: Right;   CARPAL TUNNEL RELEASE Left 05/22/2019   Procedure: CARPAL TUNNEL RELEASE - LEFT;  Surgeon: Donato Heinz, MD;  Location: [facility] ORS;  Service: Orthopedics;  Laterality: Left;   COLONOSCOPY  2018   COLONOSCOPY N/A 10/21/2020   Procedure: COLONOSCOPY;  Surgeon: Earline Mayotte, MD;  Location: [facility] ENDOSCOPY;  Service: Endoscopy;  Laterality: N/A;    HERNIA REPAIR  10/23/2019   umbilical hernia repair   INSERT / REPLACE / REMOVE PACEMAKER     PACEMAKER INSERTION  11-21-14   STJ Assurity dual chamber pacemaker implanted by Dr Johney Frame for CHB   PERMANENT PACEMAKER INSERTION N/A 11/21/2014   Procedure: PERMANENT PACEMAKER INSERTION;  Surgeon: Hillis Range, MD;  Location: [facility] CATH LAB;  Service: Cardiovascular;  Laterality: N/A;   TEE WITHOUT CARDIOVERSION N/A 11/21/2016   Procedure: TRANSESOPHAGEAL ECHOCARDIOGRAM (TEE);  Surgeon: Duke Salvia, MD;  Location: [facility] ORS;  Service: Cardiovascular;  Laterality: N/A;   TEE WITHOUT CARDIOVERSION N/A 11/09/2022   Procedure: TRANSESOPHAGEAL ECHOCARDIOGRAM;  Surgeon: Debbe Odea, MD;  Location: [facility] ORS;  Service: Cardiovascular;  Laterality: N/A;   UPPER GI ENDOSCOPY      Current Outpatient Medications  Medication Sig Dispense Refill   acetaminophen (TYLENOL) 500 MG tablet Take 500 mg by mouth every 6 (six) hours as needed (back pain.).      allopurinol (ZYLOPRIM) 100 MG tablet Take 100 mg by mouth daily.     allopurinol (ZYLOPRIM) 300 MG tablet Take 300 mg by mouth daily.  3   bisacodyl (DULCOLAX) 5 MG EC tablet Take 5 mg by mouth 2 (two) times daily.     carboxymethylcellulose (REFRESH PLUS) 0.5 % SOLN Place 1 drop into both eyes at bedtime.     carvedilol (COREG) 3.125 MG tablet Take 3.125 mg by mouth 2 (two) times daily.     desloratadine (CLARINEX) 5 MG tablet Take 5 mg by mouth at bedtime.      ELIQUIS 5 MG TABS tablet TAKE 1 TABLET BY MOUTH TWICE A DAY 180 tablet 1   FLUoxetine (PROZAC) 10 MG capsule Take 10 mg by mouth daily.     fluticasone (FLONASE) 50 MCG/ACT nasal spray Place 2 sprays into the nose daily as needed for allergies.      lansoprazole (PREVACID) 15 MG capsule Take 15 mg by mouth daily as needed (indigestion).      magnesium oxide (MAG-OX) 400 MG tablet Take 400 mg by mouth at bedtime.     metolazone (ZAROXOLYN) 2.5 MG tablet TAKE 1 TABLET BY MOUTH DAILY AS NEEDED (ONCE  DAILY AS NEEDED FOR WEIGHT GAIN OR SWELLING.). 90 tablet 3   Multiple Vitamins-Minerals (OCUVITE ADULT 50+ PO) Take 1 tablet by mouth daily.     oxybutynin (DITROPAN-XL) 5 MG 24 hr tablet Take 5 mg by mouth at bedtime.     polyethylene glycol (MIRALAX / GLYCOLAX) packet Take 17 g by mouth daily as needed (constipation.).      potassium chloride (KLOR-CON M) 10 MEQ tablet Take 2 tablets (20 mEq total) by mouth daily. take additional 2 tablets (20 meq) as needed daily with extra dose  of torsemide 260 tablet 3   pramipexole (MIRAPEX) 1 MG tablet Take 1 mg by mouth 4 (four) times daily.     pregabalin (LYRICA) 25 MG capsule Take 25 mg by mouth every other day.     Red Yeast Rice 600 MG CAPS Take 600 mg by mouth daily.     sacubitril-valsartan (ENTRESTO) 24-26 MG Take 1 tablet by mouth 2 (two) times daily. 180 tablet 3   selegiline (ELDEPRYL) 5 MG capsule Take 5 mg by mouth 2 (two) times daily with a meal.      SENEXON-S 8.6-50 MG tablet Take 2 tablets by mouth 2 (two) times daily.     torsemide 40 MG TABS Take 40 mg by mouth once for 1 dose. Take an additional 40 mg after lunch for weight greater then 258 lb 180 tablet 3   No current facility-administered medications for this visit.    Allergies  Allergen Reactions   Linaclotide Shortness Of Breath and Other (See Comments)   Codeine Diarrhea, Nausea And Vomiting and Other (See Comments)   Oxycodone Nausea And Vomiting   Terazosin Other (See Comments)    Dizziness / altered mental state  Dizziness / altered mental state  Dizziness / altered mental state    Gabapentin Other (See Comments)    dizziness Other reaction(s): Other (See Comments) dizziness   Hydrocodone-Acetaminophen Diarrhea, Nausea And Vomiting and Other (See Comments)   Lubiprostone Other (See Comments)    Abdominal cramps Other reaction(s): Other (See Comments) Abdominal cramps Abdominal cramps   Norco [Hydrocodone-Acetaminophen] Diarrhea and Nausea And Vomiting    Omeprazole Diarrhea, Nausea And Vomiting and Other (See Comments)    Stomach cramps   Ropinirole Nausea Only and Other (See Comments)    Review of Systems negative except from HPI and PMH  Physical Exam BP 130/72   Pulse 70   Ht 6' (1.829 m)   Wt 254 lb 6.4 oz (115.4 kg)   SpO2 97%   BMI 34.50 kg/m  Well developed and well nourished in no acute distress HENT normal Neck supple   Bibasilar crackles Device pocket well healed; without hematoma or erythema.  There is no tethering  Regular rate and rhythm, no  gallop No  murmur Abd-soft with active BS No Clubbing cyanosis 2-3+ edema Skin-warm and dry A & Oriented  Grossly normal sensory and motor function  ECG atrial fibs flutter with underlying ventricular pacing at 70  Device function is normal. Programming changes efforts were undertaken to pace terminate without success See Paceart for details      Assessment and  Plan  Complete heart block  Sinus arrest  Atrial fibrillation-persistent  Pacemaker-St. Jude    Parkinson's disease  Hypertension  Hypertensive heart disease   Renal insuff gd 3  HFpEF  Aortic root Dilitation  Obesity  Persistent atrial fibs flutter.  Atrial electrogram morphologies are relatively stable but there is modest variation in the PP interval suggesting nonstapled circuit.  Efforts to pace terminate were not surprisingly unsuccessful.  Will plan cardioversion  Significantly volume overloaded.  Torsemide was initiated earlier this week at 40 mg.  I have told the family that if he does not urinate briskly, i.e. torrentially with the 40 mg, he should increase the dose to 60 mg.  Parkinson's is psychologically as well as physically debilitated.  May benefit at some point with attention to that

## 2023-02-02 NOTE — Patient Instructions (Signed)
Medication Instructions:  Your physician recommends that you continue on your current medications as directed. Please refer to the Current Medication list given to you today.  *If you need a refill on your cardiac medications before your next appointment, please call your pharmacy*   Lab Work: CBC and BMET If you have labs (blood work) drawn today and your tests are completely normal, you will receive your results only by: MyChart Message (if you have MyChart) OR A paper copy in the mail If you have any lab test that is abnormal or we need to change your treatment, we will call you to review the results.   Testing/Procedures: Your physician has recommended that you have a Cardioversion (DCCV). Electrical Cardioversion uses a jolt of electricity to your heart either through paddles or wired patches attached to your chest. This is a controlled, usually prescheduled, procedure. Defibrillation is done under light anesthesia in the hospital, and you usually go home the day of the procedure. This is done to get your heart back into a normal rhythm. You are not awake for the procedure. Please see the instruction sheet given to you today.    Follow-Up: At [facility], you and your health needs are our priority.  As part of our continuing mission to provide you with exceptional heart care, we have created designated Provider Care Teams.  These Care Teams include your primary Cardiologist (physician) and Advanced Practice Providers (APPs -  Physician Assistants and Nurse Practitioners) who all work together to provide you with the care you need, when you need it.  We recommend signing up for the patient portal called "MyChart".  Sign up information is provided on this After Visit Summary.  MyChart is used to connect with patients for Virtual Visits (Telemedicine).  Patients are able to view lab/test results, encounter notes, upcoming appointments, etc.  Non-urgent messages can be sent to your  provider as well.   To learn more about what you can do with MyChart, go to [email or web address].    Your next appointment:   DCCV follow up with Dr Mariah Milling  12 months with Dr Graciela Husbands  Other Instructions You are scheduled for a Cardioversion on Wednesday, May 1,2024 with Dr. Azucena Cecil.    Please arrive at the [facility] Entrance of [facility], [address], [place] [address] at 630 am/pm (This is 1 hour prior to your procedure time).  Proceed to the Check-In Desk directly inside the entrance.  Procedure Parking: Use the entrance off of the [address] side of the hospital. Turn right upon entering and follow the driveway to parking that is directly in front of the [facility]. There is no valet parking available at this entrance, however there is an awning directly in front of the [facility] for drop off/ pick up for patients  DIET: Nothing to eat or drink after midnight except a sip of water with medications (see medication instructions below)  Medication Instructions: Hold Metolazone and Torsemide the morning of your procedure - You may take other morning medications with enough water to get them down safely. Continue your anticoagulant: Eiliquis If you miss a dose, please call us at [phone] You will need to continue your anticoagulant after your procedure until you are told by your provider that it is safe to stop.   Labs:Please complete labs on Monday, April 29,2024 at the [facility].  You may walk in for these labs.  FYI: For your safety,  and to allow [place] to monitor your vital signs accurately during the surgery/procedure we request that if you have artificial nails, gel coating, SNS etc. Please have those removed prior to your surgery/procedure. Not having the nail coverings /polish removed may result in cancellation or delay of your surgery/procedure.  You must have a responsible person to drive you home and stay in the  waiting area during your procedure. Failure to do so could result in cancellation.  Bring your insurance cards.  If you have any questions after you get home, please call the office at [phone]  *Special Note: Every effort is made to have your procedure done on time. Occasionally there are emergencies that occur at the hospital that may cause delays. Please be patient if a delay does occur.

## 2023-02-02 NOTE — H&P (View-Only) (Signed)
    Patient Care Team: Fitzgerald, David P, MD as PCP - General (Infectious Diseases) Gollan, Timothy J, MD as PCP - Cardiology (Cardiology) Bina Veenstra C, MD as PCP - Electrophysiology (Cardiology) Hackney, Tina A, FNP as Nurse Practitioner (Cardiology) Gollan, Timothy J, MD as Consulting Physician (Cardiology) Potter, Zachary E, MD as Referring Physician (Neurology)   HPI  Matthew Howe is a 76 y.o. male Seen following pacemaker implantation Abbott 2/16 by Dr. JA . He had presented to [facility] with chest pain and shortness of breath. He was found to be in complete heart block.  History of atrial fibrillation on flecainide with anticoagulation with apixaban;  flecainide stopped 2/24 in the setting of an admission for sepsis and respiratory failure.  He underwent TEE because of streptococcal pneumoniae bacteremia.?  To cellulitis.  Concern for vegetation on the RA lead but a fibrionous strand with low specificity for vegetation.  Outpatient antibiotics were recommended, repeat cultures were negative.  Extraction was not pursued because of comorbidities  No chest pain but shortness of breath, significant peripheral edema.  Sleeps in a bed ended up in a chair, probably some orthopnea and nocturnal dyspnea.  No palpitations.  Saw Dr. TG a few days ago who changed her from furosemide--torsemide; urinary output was a little bit more brisk but not torrential  Symptoms worsened considerably in January.  As noted above developed sepsis around that time.  Remains on apixaban     Catheterization 2/14 had demonstrated mild nonobstructive disease. DATE TEST EF   10/16 echo   55-60 %   3/18 Echo   55-60 %   6/20 Echo  55-60  Asc Ao 3.9 LVH mild LAE (-/53/38)  1/24 Echo  40-45%       Date Cr K Hgb  2/18 1.36   14.7  1/19  0.38 4.5 13.3  3/19     12.4  12/19 2.29 4.4 10.7  5/20 2.0 4.3 13.2  8/20 2.3    12/20 1.53 4.4 13.8  6/21 1.6 4.4 14.6  6/22 1.7 4.6 14.2  3/24 1.63 4.0 14.6           Past Medical History:  Diagnosis Date   Arthritis    Atrial fibrillation    Atrial fibrillation    Carpal tunnel syndrome on both sides    CHF (congestive heart failure)    CKD (chronic kidney disease) 10/2018   recent diagnosis   Complete heart block    a. s/p St. Jude PPM 11/2014.   Complication of anesthesia    stayed overnight dt under too long d/t taking Parkinson's medication (2013?)   Coronary artery disease    GERD (gastroesophageal reflux disease)    "not as bad as it used to be"   Heart murmur    History of recent blood transfusion 07/2018   Hyperlipidemia    Hypertension    IBS (irritable bowel syndrome)    Lower extremity edema    Parkinson's disease    Presence of permanent cardiac pacemaker    St Jude   Shortness of breath dyspnea     Past Surgical History:  Procedure Laterality Date   APPENDECTOMY  12/2010   BACK SURGERY  08/06/2018   lower lumbarsacral region, has rods in place. (done at [facility])   CARDIAC CATHETERIZATION  11/20/2012   Known CAD with one vessel coronary disease of left descending artery by cardiac cath.    CARDIOVERSION N/A 11/21/2016   Procedure: CARDIOVERSION;  Surgeon: Terah Robey   C Onesha Krebbs, MD;  Location: [facility] ORS;  Service: Cardiovascular;  Laterality: N/A;   CARDIOVERSION N/A 03/08/2019   Procedure: CARDIOVERSION;  Surgeon: Ross, Paula V, MD;  Location: [facility];  Service: Cardiovascular;  Laterality: N/A;   CARPAL TUNNEL RELEASE Right 10/22/2018   Procedure: CARPAL TUNNEL RELEASE;  Surgeon: Hooten, James P, MD;  Location: [facility] ORS;  Service: Orthopedics;  Laterality: Right;   CARPAL TUNNEL RELEASE Left 05/22/2019   Procedure: CARPAL TUNNEL RELEASE - LEFT;  Surgeon: Hooten, James P, MD;  Location: [facility] ORS;  Service: Orthopedics;  Laterality: Left;   COLONOSCOPY  2018   COLONOSCOPY N/A 10/21/2020   Procedure: COLONOSCOPY;  Surgeon: Byrnett, Jeffrey W, MD;  Location: [facility] ENDOSCOPY;  Service: Endoscopy;  Laterality: N/A;    HERNIA REPAIR  10/23/2019   umbilical hernia repair   INSERT / REPLACE / REMOVE PACEMAKER     PACEMAKER INSERTION  11-21-14   STJ Assurity dual chamber pacemaker implanted by Dr Allred for CHB   PERMANENT PACEMAKER INSERTION N/A 11/21/2014   Procedure: PERMANENT PACEMAKER INSERTION;  Surgeon: James Allred, MD;  Location: [facility];  Service: Cardiovascular;  Laterality: N/A;   TEE WITHOUT CARDIOVERSION N/A 11/21/2016   Procedure: TRANSESOPHAGEAL ECHOCARDIOGRAM (TEE);  Surgeon: Trumaine Wimer C Azalia Neuberger, MD;  Location: [facility] ORS;  Service: Cardiovascular;  Laterality: N/A;   TEE WITHOUT CARDIOVERSION N/A 11/09/2022   Procedure: TRANSESOPHAGEAL ECHOCARDIOGRAM;  Surgeon: Agbor-Etang, Brian, MD;  Location: [facility] ORS;  Service: Cardiovascular;  Laterality: N/A;   UPPER GI ENDOSCOPY      Current Outpatient Medications  Medication Sig Dispense Refill   acetaminophen (TYLENOL) 500 MG tablet Take 500 mg by mouth every 6 (six) hours as needed (back pain.).      allopurinol (ZYLOPRIM) 100 MG tablet Take 100 mg by mouth daily.     allopurinol (ZYLOPRIM) 300 MG tablet Take 300 mg by mouth daily.  3   bisacodyl (DULCOLAX) 5 MG EC tablet Take 5 mg by mouth 2 (two) times daily.     carboxymethylcellulose (REFRESH PLUS) 0.5 % SOLN Place 1 drop into both eyes at bedtime.     carvedilol (COREG) 3.125 MG tablet Take 3.125 mg by mouth 2 (two) times daily.     desloratadine (CLARINEX) 5 MG tablet Take 5 mg by mouth at bedtime.      ELIQUIS 5 MG TABS tablet TAKE 1 TABLET BY MOUTH TWICE A DAY 180 tablet 1   FLUoxetine (PROZAC) 10 MG capsule Take 10 mg by mouth daily.     fluticasone (FLONASE) 50 MCG/ACT nasal spray Place 2 sprays into the nose daily as needed for allergies.      lansoprazole (PREVACID) 15 MG capsule Take 15 mg by mouth daily as needed (indigestion).      magnesium oxide (MAG-OX) 400 MG tablet Take 400 mg by mouth at bedtime.     metolazone (ZAROXOLYN) 2.5 MG tablet TAKE 1 TABLET BY MOUTH DAILY AS NEEDED (ONCE  DAILY AS NEEDED FOR WEIGHT GAIN OR SWELLING.). 90 tablet 3   Multiple Vitamins-Minerals (OCUVITE ADULT 50+ PO) Take 1 tablet by mouth daily.     oxybutynin (DITROPAN-XL) 5 MG 24 hr tablet Take 5 mg by mouth at bedtime.     polyethylene glycol (MIRALAX / GLYCOLAX) packet Take 17 g by mouth daily as needed (constipation.).      potassium chloride (KLOR-CON M) 10 MEQ tablet Take 2 tablets (20 mEq total) by mouth daily. take additional 2 tablets (20 meq) as needed daily with extra dose   of torsemide 260 tablet 3   pramipexole (MIRAPEX) 1 MG tablet Take 1 mg by mouth 4 (four) times daily.     pregabalin (LYRICA) 25 MG capsule Take 25 mg by mouth every other day.     Red Yeast Rice 600 MG CAPS Take 600 mg by mouth daily.     sacubitril-valsartan (ENTRESTO) 24-26 MG Take 1 tablet by mouth 2 (two) times daily. 180 tablet 3   selegiline (ELDEPRYL) 5 MG capsule Take 5 mg by mouth 2 (two) times daily with a meal.      SENEXON-S 8.6-50 MG tablet Take 2 tablets by mouth 2 (two) times daily.     torsemide 40 MG TABS Take 40 mg by mouth once for 1 dose. Take an additional 40 mg after lunch for weight greater then 258 lb 180 tablet 3   No current facility-administered medications for this visit.    Allergies  Allergen Reactions   Linaclotide Shortness Of Breath and Other (See Comments)   Codeine Diarrhea, Nausea And Vomiting and Other (See Comments)   Oxycodone Nausea And Vomiting   Terazosin Other (See Comments)    Dizziness / altered mental state  Dizziness / altered mental state  Dizziness / altered mental state    Gabapentin Other (See Comments)    dizziness Other reaction(s): Other (See Comments) dizziness   Hydrocodone-Acetaminophen Diarrhea, Nausea And Vomiting and Other (See Comments)   Lubiprostone Other (See Comments)    Abdominal cramps Other reaction(s): Other (See Comments) Abdominal cramps Abdominal cramps   Norco [Hydrocodone-Acetaminophen] Diarrhea and Nausea And Vomiting    Omeprazole Diarrhea, Nausea And Vomiting and Other (See Comments)    Stomach cramps   Ropinirole Nausea Only and Other (See Comments)    Review of Systems negative except from HPI and PMH  Physical Exam BP 130/72   Pulse 70   Ht 6' (1.829 m)   Wt 254 lb 6.4 oz (115.4 kg)   SpO2 97%   BMI 34.50 kg/m  Well developed and well nourished in no acute distress HENT normal Neck supple   Bibasilar crackles Device pocket well healed; without hematoma or erythema.  There is no tethering  Regular rate and rhythm, no  gallop No  murmur Abd-soft with active BS No Clubbing cyanosis 2-3+ edema Skin-warm and dry A & Oriented  Grossly normal sensory and motor function  ECG atrial fibs flutter with underlying ventricular pacing at 70  Device function is normal. Programming changes efforts were undertaken to pace terminate without success See Paceart for details      Assessment and  Plan  Complete heart block  Sinus arrest  Atrial fibrillation-persistent  Pacemaker-St. Jude    Parkinson's disease  Hypertension  Hypertensive heart disease   Renal insuff gd 3  HFpEF  Aortic root Dilitation  Obesity  Persistent atrial fibs flutter.  Atrial electrogram morphologies are relatively stable but there is modest variation in the PP interval suggesting nonstapled circuit.  Efforts to pace terminate were not surprisingly unsuccessful.  Will plan cardioversion  Significantly volume overloaded.  Torsemide was initiated earlier this week at 40 mg.  I have told the family that if he does not urinate briskly, i.e. torrentially with the 40 mg, he should increase the dose to 60 mg.  Parkinson's is psychologically as well as physically debilitated.  May benefit at some point with attention to that     

## 2023-02-02 NOTE — Telephone Encounter (Signed)
Patient's wife is calling stating she left the papers given to them in the buggy portion of the wheelchair. Due to this she is requesting a callback to discuss some of the information that was on it. Please advise.

## 2023-02-06 ENCOUNTER — Other Ambulatory Visit
Admission: RE | Admit: 2023-02-06 | Discharge: 2023-02-06 | Disposition: A | Discharge status: Home / Self Care | Payer: Medicare Other | Attending: Internal Medicine | Admitting: Internal Medicine

## 2023-02-06 DIAGNOSIS — Z950 Presence of cardiac pacemaker: Secondary | ICD-10-CM | POA: Diagnosis present

## 2023-02-06 DIAGNOSIS — Z01812 Encounter for preprocedural laboratory examination: Secondary | ICD-10-CM

## 2023-02-06 DIAGNOSIS — I442 Atrioventricular block, complete: Secondary | ICD-10-CM | POA: Diagnosis present

## 2023-02-06 DIAGNOSIS — I5032 Chronic diastolic (congestive) heart failure: Secondary | ICD-10-CM | POA: Diagnosis present

## 2023-02-06 DIAGNOSIS — I4819 Other persistent atrial fibrillation: Secondary | ICD-10-CM

## 2023-02-06 DIAGNOSIS — I77810 Thoracic aortic ectasia: Secondary | ICD-10-CM | POA: Diagnosis present

## 2023-02-06 LAB — BASIC METABOLIC PANEL
Anion gap: 9 (ref 5–15)
BUN: 36 mg/dL — ABNORMAL HIGH (ref 8–23)
CO2: 26 mmol/L (ref 22–32)
Calcium: 9.3 mg/dL (ref 8.9–10.3)
Chloride: 106 mmol/L (ref 98–111)
Creatinine, Ser: 1.56 mg/dL — ABNORMAL HIGH (ref 0.61–1.24)
GFR, Estimated: 46 mL/min — ABNORMAL LOW (ref >60)
Glucose, Bld: 105 mg/dL — ABNORMAL HIGH (ref 70–99)
Potassium: 4 mmol/L (ref 3.5–5.1)
Sodium: 141 mmol/L (ref 135–145)

## 2023-02-06 LAB — CBC
HCT: 48.2 % (ref 39.0–52.0)
Hemoglobin: 15.1 g/dL (ref 13.0–17.0)
MCH: 29.2 pg (ref 26.0–34.0)
MCHC: 31.3 g/dL (ref 30.0–36.0)
MCV: 93.1 fL (ref 80.0–100.0)
Platelets: 239 10*3/uL (ref 150–400)
RBC: 5.18 MIL/uL (ref 4.22–5.81)
RDW: 14.6 % (ref 11.5–15.5)
WBC: 6.5 10*3/uL (ref 4.0–10.5)
nRBC: 0 % (ref 0.0–0.2)

## 2023-02-08 ENCOUNTER — Encounter: Payer: Self-pay | Admitting: Cardiology

## 2023-02-08 ENCOUNTER — Ambulatory Visit: Payer: Medicare Other | Admitting: Certified Registered"

## 2023-02-08 ENCOUNTER — Ambulatory Visit
Admission: RE | Admit: 2023-02-08 | Discharge: 2023-02-08 | Disposition: A | Discharge status: Home / Self Care | Payer: Medicare Other | Attending: Cardiology | Admitting: Cardiology

## 2023-02-08 ENCOUNTER — Encounter
Admission: RE | Disposition: A | Discharge status: Home / Self Care | Payer: Self-pay | Source: Home / Self Care | Attending: Cardiology

## 2023-02-08 DIAGNOSIS — K219 Gastro-esophageal reflux disease without esophagitis: Secondary | ICD-10-CM | POA: Diagnosis not present

## 2023-02-08 DIAGNOSIS — G20A1 Parkinson's disease without dyskinesia, without mention of fluctuations: Secondary | ICD-10-CM | POA: Insufficient documentation

## 2023-02-08 DIAGNOSIS — I442 Atrioventricular block, complete: Secondary | ICD-10-CM | POA: Insufficient documentation

## 2023-02-08 DIAGNOSIS — I4819 Other persistent atrial fibrillation: Secondary | ICD-10-CM | POA: Insufficient documentation

## 2023-02-08 DIAGNOSIS — Z6833 Body mass index (BMI) 33.0-33.9, adult: Secondary | ICD-10-CM | POA: Diagnosis not present

## 2023-02-08 DIAGNOSIS — I4892 Unspecified atrial flutter: Secondary | ICD-10-CM | POA: Diagnosis not present

## 2023-02-08 DIAGNOSIS — Z79899 Other long term (current) drug therapy: Secondary | ICD-10-CM | POA: Insufficient documentation

## 2023-02-08 DIAGNOSIS — Z7901 Long term (current) use of anticoagulants: Secondary | ICD-10-CM | POA: Insufficient documentation

## 2023-02-08 DIAGNOSIS — I5030 Unspecified diastolic (congestive) heart failure: Secondary | ICD-10-CM | POA: Diagnosis not present

## 2023-02-08 DIAGNOSIS — I2510 Atherosclerotic heart disease of native coronary artery without angina pectoris: Secondary | ICD-10-CM | POA: Insufficient documentation

## 2023-02-08 DIAGNOSIS — I455 Other specified heart block: Secondary | ICD-10-CM | POA: Diagnosis not present

## 2023-02-08 DIAGNOSIS — E669 Obesity, unspecified: Secondary | ICD-10-CM | POA: Diagnosis not present

## 2023-02-08 DIAGNOSIS — Z950 Presence of cardiac pacemaker: Secondary | ICD-10-CM | POA: Diagnosis not present

## 2023-02-08 DIAGNOSIS — I130 Hypertensive heart and chronic kidney disease with heart failure and stage 1 through stage 4 chronic kidney disease, or unspecified chronic kidney disease: Secondary | ICD-10-CM | POA: Diagnosis not present

## 2023-02-08 DIAGNOSIS — N183 Chronic kidney disease, stage 3 unspecified: Secondary | ICD-10-CM | POA: Insufficient documentation

## 2023-02-08 HISTORY — PX: CARDIOVERSION: SHX1299

## 2023-02-08 LAB — GLUCOSE, CAPILLARY: Glucose-Capillary: 113 mg/dL — ABNORMAL HIGH (ref 70–99)

## 2023-02-08 SURGERY — CARDIOVERSION
Anesthesia: General

## 2023-02-08 MED ORDER — PROPOFOL 10 MG/ML IV BOLUS
INTRAVENOUS | Status: DC | PRN
  Administered 2023-02-08: 60 mg via INTRAVENOUS

## 2023-02-08 MED ORDER — FENTANYL CITRATE (PF) 100 MCG/2ML IJ SOLN: 25.0000 ug | INTRAMUSCULAR | Status: DC | PRN

## 2023-02-08 MED ORDER — OXYCODONE HCL 5 MG/5ML PO SOLN: 5.0000 mg | Freq: Once | ORAL | Status: DC | PRN

## 2023-02-08 MED ORDER — DROPERIDOL 2.5 MG/ML IJ SOLN: 0.6250 mg | Freq: Once | INTRAMUSCULAR | Status: DC | PRN

## 2023-02-08 MED ORDER — PROMETHAZINE HCL 25 MG/ML IJ SOLN: 6.2500 mg | INTRAMUSCULAR | Status: DC | PRN

## 2023-02-08 MED ORDER — OXYCODONE HCL 5 MG PO TABS: 5.0000 mg | ORAL_TABLET | Freq: Once | ORAL | Status: DC | PRN

## 2023-02-08 MED ORDER — ACETAMINOPHEN 10 MG/ML IV SOLN: 1000.0000 mg | Freq: Once | INTRAVENOUS | Status: DC | PRN

## 2023-02-08 MED ORDER — SODIUM CHLORIDE 0.9 % IV SOLN: INTRAVENOUS | Status: DC

## 2023-02-08 NOTE — Anesthesia Preprocedure Evaluation (Signed)
Anesthesia Evaluation  Patient identified by MRN, date of birth, ID band Patient awake    Reviewed: Allergy & Precautions, H&P , NPO status , Patient's Chart, lab work & pertinent test results, reviewed documented beta blocker date and time   History of Anesthesia Complications (+) PROLONGED EMERGENCE and history of anesthetic complications  Airway Mallampati: II   Neck ROM: full    Dental  (+) Poor Dentition   Pulmonary shortness of breath and with exertion   Pulmonary exam normal        Cardiovascular Exercise Tolerance: Poor hypertension, + CAD and +CHF  (-) Orthopnea + dysrhythmias Atrial Fibrillation + pacemaker + Valvular Problems/Murmurs  Rhythm:regular Rate:Normal     Neuro/Psych  Neuromuscular disease  negative psych ROS   GI/Hepatic Neg liver ROS,GERD  Medicated,,  Endo/Other  negative endocrine ROS    Renal/GU Renal disease  negative genitourinary   Musculoskeletal   Abdominal   Peds  Hematology negative hematology ROS (+)   Anesthesia Other Findings Past Medical History: No date: Arthritis No date: Atrial fibrillation (HCC) No date: Atrial fibrillation (HCC) No date: Carpal tunnel syndrome on both sides No date: CHF (congestive heart failure) (HCC) 10/2018: CKD (chronic kidney disease)     Comment:  recent diagnosis No date: Complete heart block (HCC)     Comment:  a. s/p St. Jude PPM 11/2014. No date: Complication of anesthesia     Comment:  stayed overnight dt under too long d/t taking               Parkinson's medication (2013?) No date: Coronary artery disease No date: GERD (gastroesophageal reflux disease)     Comment:  "not as bad as it used to be" No date: Heart murmur 07/2018: History of recent blood transfusion No date: Hyperlipidemia No date: Hypertension No date: IBS (irritable bowel syndrome) No date: Lower extremity edema No date: Parkinson's disease No date: Presence of  permanent cardiac pacemaker     Comment:  St Jude No date: Shortness of breath dyspnea Past Surgical History: 12/2010: APPENDECTOMY 08/06/2018: BACK SURGERY     Comment:  lower lumbarsacral region, has rods in place. (done at               [facility]) 11/20/2012: CARDIAC CATHETERIZATION     Comment:  Known CAD with one vessel coronary disease of left               descending artery by cardiac cath.  11/21/2016: CARDIOVERSION; N/A     Comment:  Procedure: CARDIOVERSION;  Surgeon: Duke Salvia, MD;               Location: [facility] ORS;  Service: Cardiovascular;                Laterality: N/A; 03/08/2019: CARDIOVERSION; N/A     Comment:  Procedure: CARDIOVERSION;  Surgeon: Pricilla Riffle, MD;                Location: [facility] ENDOSCOPY;  Service: Cardiovascular;                Laterality: N/A; 10/22/2018: CARPAL TUNNEL RELEASE; Right     Comment:  Procedure: CARPAL TUNNEL RELEASE;  Surgeon: Donato Heinz, MD;  Location: [facility] ORS;  Service: Orthopedics;               Laterality: Right; 05/22/2019: CARPAL TUNNEL RELEASE; Left  Comment:  Procedure: CARPAL TUNNEL RELEASE - LEFT;  Surgeon:               Donato Heinz, MD;  Location: [facility] ORS;  Service:               Orthopedics;  Laterality: Left; 2018: COLONOSCOPY 10/21/2020: COLONOSCOPY; N/A     Comment:  Procedure: COLONOSCOPY;  Surgeon: Earline Mayotte,               MD;  Location: [facility] ENDOSCOPY;  Service: Endoscopy;                Laterality: N/A; 10/23/2019: HERNIA REPAIR     Comment:  umbilical hernia repair No date: INSERT / REPLACE / REMOVE PACEMAKER 11-21-14: PACEMAKER INSERTION     Comment:  STJ Assurity dual chamber pacemaker implanted by Dr               Johney Frame for CHB 11/21/2014: PERMANENT PACEMAKER INSERTION; N/A     Comment:  Procedure: PERMANENT PACEMAKER INSERTION;  Surgeon:               Hillis Range, MD;  Location: [facility];  Service:               Cardiovascular;  Laterality: N/A; 11/21/2016: TEE  WITHOUT CARDIOVERSION; N/A     Comment:  Procedure: TRANSESOPHAGEAL ECHOCARDIOGRAM (TEE);                Surgeon: Duke Salvia, MD;  Location: [facility] ORS;                Service: Cardiovascular;  Laterality: N/A; 11/09/2022: TEE WITHOUT CARDIOVERSION; N/A     Comment:  Procedure: TRANSESOPHAGEAL ECHOCARDIOGRAM;  Surgeon:               Debbe Odea, MD;  Location: [facility] ORS;  Service:               Cardiovascular;  Laterality: N/A; No date: UPPER GI ENDOSCOPY   Reproductive/Obstetrics negative OB ROS                             Anesthesia Physical Anesthesia Plan  ASA: 4  Anesthesia Plan: General   Post-op Pain Management:    Induction:   PONV Risk Score and Plan: 2  Airway Management Planned:   Additional Equipment:   Intra-op Plan:   Post-operative Plan:   Informed Consent: I have reviewed the patients History and Physical, chart, labs and discussed the procedure including the risks, benefits and alternatives for the proposed anesthesia with the patient or authorized representative who has indicated his/her understanding and acceptance.     Dental Advisory Given  Plan Discussed with: CRNA  Anesthesia Plan Comments:        Anesthesia Quick Evaluation

## 2023-02-08 NOTE — Interval H&P Note (Signed)
History and Physical Interval Note:  02/08/2023 6:05 PM  Matthew Howe  has presented today for surgery, with the diagnosis of atrial flutter.  The various methods of treatment have been discussed with the patient and family. After consideration of risks, benefits and other options for treatment, the patient has consented to  Procedure(s): CARDIOVERSION (N/A) as a surgical intervention.  The patient's history has been reviewed, patient examined, no change in status, stable for surgery.  I have reviewed the patient's chart and labs.  Questions were answered to the patient's satisfaction.     [name]

## 2023-02-08 NOTE — Procedures (Signed)
Cardioversion procedure note For atrial flutter  Procedure Details:  Consent: Risks of procedure as well as the alternatives and risks of each were explained to the (patient/caregiver).  Consent for procedure obtained.  Time Out: Verified patient identification, verified procedure, site/side was marked, verified correct patient position, special equipment/implants available, medications/allergies/relevent history reviewed, required imaging and test results available.  Performed  Patient placed on cardiac monitor, pulse oximetry, supplemental oxygen as necessary.   Sedation given: propofol IV per anesthesia team Pacer pads placed anterior and posterior chest.   Cardioverted 1 time(s).   Cardioverted at  200J. Synchronized biphasic Converted to AV-pacing   Evaluation: Findings: Post procedure EKG shows: A-V pacing Complications: None Patient did tolerate procedure well.  Conclusion/recommendations Successful DC cardioversion Continue eliquis Keep appointments with EP, primary cardiologist.  Time Spent Directly with the Patient:  25 minutes   Debbe Odea, M.D.

## 2023-02-08 NOTE — Anesthesia Procedure Notes (Signed)
Procedure Name: General with mask airway Date/Time: 02/08/2023 7:42 AM  Performed by: Mohammed Kindle, CRNAPre-anesthesia Checklist: Patient identified, Emergency Drugs available, Suction available and Patient being monitored Patient Re-evaluated:Patient Re-evaluated prior to induction Oxygen Delivery Method: Simple face mask Induction Type: IV induction Placement Confirmation: positive ETCO2, CO2 detector and breath sounds checked- equal and bilateral Dental Injury: Teeth and Oropharynx as per pre-operative assessment

## 2023-02-08 NOTE — Transfer of Care (Signed)
Immediate Anesthesia Transfer of Care Note  Patient: Matthew Howe  Procedure(s) Performed: CARDIOVERSION  Patient Location:  [facility]   Anesthesia Type:MAC  Level of Consciousness: drowsy and patient cooperative  Airway & Oxygen Therapy: Patient Spontanous Breathing and Patient connected to face mask oxygen  Post-op Assessment: Report given to RN and Post -op Vital signs reviewed and stable  Post vital signs: Reviewed and stable  Last Vitals:  Vitals Value Taken Time  BP 129/77 02/08/23 0742  Temp    Pulse 70 02/08/23 0744  Resp 17 02/08/23 0744  SpO2 98 % 02/08/23 0744  Vitals shown include unvalidated device data.  Last Pain:  Vitals:   02/08/23 0713  TempSrc: Oral  PainSc: 0-No pain         Complications: No notable events documented.

## 2023-02-09 ENCOUNTER — Encounter: Payer: Self-pay | Admitting: Cardiology

## 2023-02-09 NOTE — Anesthesia Postprocedure Evaluation (Signed)
Anesthesia Post Note  Patient: SARP VERNIER  Procedure(s) Performed: CARDIOVERSION  Patient location during evaluation: PACU Anesthesia Type: General Level of consciousness: awake and alert Pain management: pain level controlled Vital Signs Assessment: post-procedure vital signs reviewed and stable Respiratory status: spontaneous breathing, nonlabored ventilation, respiratory function stable and patient connected to nasal cannula oxygen Cardiovascular status: blood pressure returned to baseline and stable Postop Assessment: no apparent nausea or vomiting Anesthetic complications: no   No notable events documented.   Last Vitals:  Vitals:   02/08/23 0800 02/08/23 0815  BP: (!) 109/59 121/60  Pulse: 60 69  Resp: 13 18  Temp:    SpO2: 93% 95%    Last Pain:  Vitals:   02/08/23 0800  TempSrc:   PainSc: 0-No pain                 Yevette Edwards

## 2023-02-21 ENCOUNTER — Encounter: Payer: Self-pay | Admitting: Internal Medicine

## 2023-02-22 ENCOUNTER — Ambulatory Visit: Payer: Medicare Other | Attending: Family | Admitting: Family

## 2023-02-22 ENCOUNTER — Encounter: Payer: Self-pay | Admitting: Family

## 2023-02-22 VITALS — BP 106/56 | HR 70 | Wt 252.0 lb

## 2023-02-22 DIAGNOSIS — I428 Other cardiomyopathies: Secondary | ICD-10-CM | POA: Insufficient documentation

## 2023-02-22 DIAGNOSIS — I2510 Atherosclerotic heart disease of native coronary artery without angina pectoris: Secondary | ICD-10-CM | POA: Diagnosis not present

## 2023-02-22 DIAGNOSIS — E785 Hyperlipidemia, unspecified: Secondary | ICD-10-CM | POA: Diagnosis not present

## 2023-02-22 DIAGNOSIS — R0602 Shortness of breath: Secondary | ICD-10-CM | POA: Diagnosis not present

## 2023-02-22 DIAGNOSIS — I4891 Unspecified atrial fibrillation: Secondary | ICD-10-CM | POA: Diagnosis not present

## 2023-02-22 DIAGNOSIS — I442 Atrioventricular block, complete: Secondary | ICD-10-CM | POA: Diagnosis not present

## 2023-02-22 DIAGNOSIS — G20A1 Parkinson's disease without dyskinesia, without mention of fluctuations: Secondary | ICD-10-CM | POA: Diagnosis not present

## 2023-02-22 DIAGNOSIS — I130 Hypertensive heart and chronic kidney disease with heart failure and stage 1 through stage 4 chronic kidney disease, or unspecified chronic kidney disease: Secondary | ICD-10-CM | POA: Insufficient documentation

## 2023-02-22 DIAGNOSIS — I10 Essential (primary) hypertension: Secondary | ICD-10-CM | POA: Diagnosis not present

## 2023-02-22 DIAGNOSIS — Z8249 Family history of ischemic heart disease and other diseases of the circulatory system: Secondary | ICD-10-CM | POA: Insufficient documentation

## 2023-02-22 DIAGNOSIS — I509 Heart failure, unspecified: Secondary | ICD-10-CM | POA: Diagnosis not present

## 2023-02-22 DIAGNOSIS — I5022 Chronic systolic (congestive) heart failure: Secondary | ICD-10-CM

## 2023-02-22 DIAGNOSIS — I4819 Other persistent atrial fibrillation: Secondary | ICD-10-CM | POA: Diagnosis not present

## 2023-02-22 DIAGNOSIS — Z7901 Long term (current) use of anticoagulants: Secondary | ICD-10-CM | POA: Diagnosis not present

## 2023-02-22 DIAGNOSIS — Z79899 Other long term (current) drug therapy: Secondary | ICD-10-CM | POA: Diagnosis not present

## 2023-02-22 DIAGNOSIS — Z823 Family history of stroke: Secondary | ICD-10-CM | POA: Insufficient documentation

## 2023-02-22 DIAGNOSIS — R3915 Urgency of urination: Secondary | ICD-10-CM | POA: Diagnosis not present

## 2023-02-22 DIAGNOSIS — N189 Chronic kidney disease, unspecified: Secondary | ICD-10-CM | POA: Insufficient documentation

## 2023-02-22 DIAGNOSIS — Z950 Presence of cardiac pacemaker: Secondary | ICD-10-CM | POA: Insufficient documentation

## 2023-02-22 NOTE — Progress Notes (Signed)
[facility] HEART FAILURE CLINIC - Pharmacist Note  Matthew Howe is a 77 y.o. male with HFmrEF (EF 41-49%) presenting to the Heart Failure Clinic for follow up. Wife is with patient at visit. She is the Radio broadcast assistant of his medications. They report no medication access issues and no adverse drug events on current regimen. Wife reports that they take his weight about every other day and that his weight has been stable around 250 lbs. Wife ensures that patient takes his medications on schedule every day and that patient is adherent to fluid restrictions. They report no signs or symptoms of volume overload.   Recent ED Visit (past 6 months):  Date: 11/11/2022, CC: PICC line dislodged Date: 11/04/2022, CC: weakness and fall  Guideline-Directed Medical Therapy/Evidence Based Medicine ACE/ARB/ARNI: Sacubitril/valsartan 24/26 mg BID Beta Blocker: Carvedilol 3.125 mg twice daily Aldosterone Antagonist:  none Diuretic: Torsemide 60 mg daily + 40 mg daily PRN + Metolazone 2.5 mg daily PRN  SGLT2i:  none  Adherence Assessment Do you ever forget to take your medication? [] Yes [x] No  Do you ever skip doses due to side effects? [] Yes [x] No  Do you have trouble affording your medicines? [] Yes [x] No  Are you ever unable to pick up your medication due to transportation difficulties? [] Yes [x] No  Do you ever stop taking your medications because you don't believe they are helping? [] Yes [x] No  Do you check your weight daily? [] Yes [x] No (every other day)  Adherence strategy: wife manages medications, pill box Barriers to obtaining medications: none reported  Diagnostics ECHO: Date 11/09/2022, EF 40-45%, moderate LVH  Vitals    02/22/2023    2:47 PM 02/08/2023    8:15 AM 02/08/2023    8:00 AM  Vitals with BMI  Weight 252 lbs    BMI 34.17    Systolic 106 121 284  Diastolic 56 60 59  Pulse 70 69 60     Recent Labs    Latest Ref Rng & Units 02/06/2023    8:28 AM 12/22/2022    2:58 PM 11/23/2022    3:29  PM  BMP  Glucose 70 - 99 mg/dL 132  440  102   BUN 8 - 23 mg/dL 36  37  30   Creatinine 0.61 - 1.24 mg/dL 7.25  3.66  4.40   Sodium 135 - 145 mmol/L 141  141  140   Potassium 3.5 - 5.1 mmol/L 4.0  4.0  4.5   Chloride 98 - 111 mmol/L 106  106  108   CO2 22 - 32 mmol/L 26  23  25    Calcium 8.9 - 10.3 mg/dL 9.3  8.8  8.9     Past Medical History Past Medical History:  Diagnosis Date   Arthritis    Atrial fibrillation (HCC)    Atrial fibrillation (HCC)    Carpal tunnel syndrome on both sides    CHF (congestive heart failure) (HCC)    CKD (chronic kidney disease) 10/2018   recent diagnosis   Complete heart block (HCC)    a. s/p St. Jude PPM 11/2014.   Complication of anesthesia    stayed overnight dt under too long d/t taking Parkinson's medication (2013?)   Coronary artery disease    GERD (gastroesophageal reflux disease)    "not as bad as it used to be"   Heart murmur    History of recent blood transfusion 07/2018   Hyperlipidemia    Hypertension    IBS (irritable bowel syndrome)    Lower  extremity edema    Parkinson's disease    Presence of permanent cardiac pacemaker    St Jude   Shortness of breath dyspnea     Plan Continue current regimen per NP Titrate GDMT as able per BP  Time spent: 10 minutes  Celene Squibb, PharmD PGY1 Pharmacy Resident 02/22/2023 3:44 PM

## 2023-02-22 NOTE — Progress Notes (Addendum)
PCP: Clydie Braun, MD (last seen 03/24) Primary Cardiologist: Julien Nordmann, MD (last seen 04/24) EP: Sherryl Manges, MD (last seen 04/24)  HPI:  This is a 77 y/o male with a history of parkinson's, HTN, hyperlipidemia, CKD, GERD, CAD, complete heart block (pacemaker), atrial fibrillation, AICD and chronic heart failure.   TEE 11/09/22: EF 40-45% along with vegetation attached to RA pacemaker lead and mild/moderate AR. Echo 12/08/16: EF of 55-60% along with mild AR/MR. Echo 07/28/15: EF of 55-60% along with mild AR.   Cardiac catheterization 11/20/12: mild nonobstructive disease.  Was in the ED 11/11/22 due to PICC line being dislodged. PICC line replaced. Admitted 11/04/22 due to sepsis with acute respiratory failure. Blood cultures positive. Echo showed possible vegetation on pacemaker lead. Poor extraction candidate. PICC line placed for 2 weeks of antibiotics.    He presents today for a HF f/u visit with a chief complaint of moderate fatigue with minimal exertion. Chronic in nature. Has associated SOB, edema (better) and urinary urgency/ frequency along with this. Has been elevating his legs more often which has helped his edema. He feels like he's sleeping well except for his urinary symptoms. Just had lab work done earlier today and sees nephrology next week. Hasn't had to take metolazone for ~ 3 months now. Has upcoming urology appointment to address his urology issues.   Had cardioversion done 02/08/23 and feels better since having this done.   ROS: All systems negative except as listed in HPI, PMH and Problem List.  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  Tobacco Use   Smoking status: Never   Smokeless tobacco: Never  Vaping Use   Vaping  Use: Never used  Substance and Sexual Activity   Alcohol use: No    Alcohol/week: 0.0 standard drinks of alcohol   Drug use: No   Sexual activity: Never  Other Topics Concern   Not on file  Social History Narrative   Not on file   Social Determinants of Health   Financial Resource Strain: Low Risk  (10/26/2017)   Overall Financial Resource Strain (CARDIA)    Difficulty of Paying Living Expenses: Not hard at all  Food Insecurity: No Food Insecurity (10/26/2017)   Hunger Vital Sign    Worried About Running Out of Food in the Last Year: Never true    Ran Out of Food in the Last Year: Never true  Transportation Needs: No Transportation Needs (10/26/2017)   PRAPARE - Administrator, Civil Service (Medical): No    Lack of Transportation (Non-Medical): No  Physical Activity: Insufficiently Active (10/26/2017)   Exercise Vital Sign    Days of Exercise per Week: 7 days    Minutes of Exercise per Session: 20 min  Stress: No Stress Concern Present (10/26/2017)   [employer] of Occupational Health - Occupational Stress Questionnaire    Feeling of Stress : Only a little  Social Connections: Moderately Integrated (10/26/2017)   Social Connection and Isolation Panel [NHANES]    Frequency of Communication with Friends and Family: More than three times a week    Frequency of Social Gatherings with Friends and Family: Never    Attends Religious Services: More than 4 times per year  Active Member of Clubs or Organizations: No    Attends Banker Meetings: Never    Marital Status: Married  Catering manager Violence: Not At Risk (10/26/2017)   Humiliation, Afraid, Rape, and Kick questionnaire    Fear of Current or Ex-Partner: No    Emotionally Abused: No    Physically Abused: No    Sexually Abused: No    FH:  Family History  Problem Relation Age of Onset   Heart disease Mother    Heart disease Father    Colon cancer Father    Stroke Sister     Past Medical  History:  Diagnosis Date   Arthritis    Atrial fibrillation (HCC)    Atrial fibrillation (HCC)    Carpal tunnel syndrome on both sides    CHF (congestive heart failure) (HCC)    CKD (chronic kidney disease) 10/2018   recent diagnosis   Complete heart block (HCC)    a. s/p St. Jude PPM 11/2014.   Complication of anesthesia    stayed overnight dt under too long d/t taking Parkinson's medication (2013?)   Coronary artery disease    GERD (gastroesophageal reflux disease)    "not as bad as it used to be"   Heart murmur    History of recent blood transfusion 07/2018   Hyperlipidemia    Hypertension    IBS (irritable bowel syndrome)    Lower extremity edema    Parkinson's disease    Presence of permanent cardiac pacemaker    St Jude   Shortness of breath dyspnea     Current Outpatient Medications  Medication Sig Dispense Refill   acetaminophen (TYLENOL) 500 MG tablet Take 500 mg by mouth every 6 (six) hours as needed (back pain.).      allopurinol (ZYLOPRIM) 100 MG tablet Take 100 mg by mouth daily.     allopurinol (ZYLOPRIM) 300 MG tablet Take 300 mg by mouth daily.  3   bisacodyl (DULCOLAX) 5 MG EC tablet Take 5 mg by mouth 2 (two) times daily.     carboxymethylcellulose (REFRESH PLUS) 0.5 % SOLN Place 1 drop into both eyes at bedtime.     carvedilol (COREG) 3.125 MG tablet Take 3.125 mg by mouth 2 (two) times daily.     desloratadine (CLARINEX) 5 MG tablet Take 5 mg by mouth at bedtime.      ELIQUIS 5 MG TABS tablet TAKE 1 TABLET BY MOUTH TWICE A DAY 180 tablet 1   FLUoxetine (PROZAC) 10 MG capsule Take 10 mg by mouth daily.     fluticasone (FLONASE) 50 MCG/ACT nasal spray Place 2 sprays into the nose daily as needed for allergies.      lansoprazole (PREVACID) 15 MG capsule Take 15 mg by mouth daily as needed (indigestion).      magnesium oxide (MAG-OX) 400 MG tablet Take 400 mg by mouth at bedtime.     metolazone (ZAROXOLYN) 2.5 MG tablet TAKE 1 TABLET BY MOUTH DAILY AS NEEDED  (ONCE DAILY AS NEEDED FOR WEIGHT GAIN OR SWELLING.). 90 tablet 3   Multiple Vitamins-Minerals (OCUVITE ADULT 50+ PO) Take 1 tablet by mouth daily.     oxybutynin (DITROPAN-XL) 5 MG 24 hr tablet Take 5 mg by mouth at bedtime.     polyethylene glycol (MIRALAX / GLYCOLAX) packet Take 17 g by mouth daily as needed (constipation.).      potassium chloride (KLOR-CON M) 10 MEQ tablet Take 2 tablets (20 mEq total) by mouth daily. take additional 2  tablets (20 meq) as needed daily with extra dose of torsemide 260 tablet 3   pramipexole (MIRAPEX) 1 MG tablet Take 1 mg by mouth 4 (four) times daily.     pregabalin (LYRICA) 25 MG capsule Take 25 mg by mouth every other day.     Red Yeast Rice 600 MG CAPS Take 600 mg by mouth daily.     sacubitril-valsartan (ENTRESTO) 24-26 MG Take 1 tablet by mouth 2 (two) times daily. 180 tablet 3   selegiline (ELDEPRYL) 5 MG capsule Take 5 mg by mouth 2 (two) times daily with a meal.      SENEXON-S 8.6-50 MG tablet Take 2 tablets by mouth 2 (two) times daily.     torsemide 40 MG TABS Take 40 mg by mouth once for 1 dose. Take an additional 40 mg after lunch for weight greater then 258 lb 180 tablet 3   No current facility-administered medications for this visit.   Vitals:   02/22/23 1447  BP: (!) 106/56  Pulse: 70  SpO2: 96%  Weight: 252 lb (114.3 kg)   Wt Readings from Last 3 Encounters:  02/22/23 252 lb (114.3 kg)  02/08/23 250 lb (113.4 kg)  02/02/23 254 lb 6.4 oz (115.4 kg)   Lab Results  Component Value Date   CREATININE 1.56 (H) 02/06/2023   CREATININE 1.63 (H) 12/22/2022   CREATININE 1.25 (H) 11/23/2022   PHYSICAL EXAM:  General:  Well appearing. No resp difficulty HEENT: normal Neck: supple. JVP flat. No lymphadenopathy or thryomegaly appreciated. Cor: PMI normal. Regular rate & rhythm. No rubs, gallops or murmurs. Lungs: clear Abdomen: soft, nontender, nondistended. No hepatosplenomegaly. No bruits or masses.  Extremities: no cyanosis,  clubbing, rash. Trace pitting edema bilaterally Neuro: alert & orientedx3, cranial nerves grossly intact. Moves all 4 extremities w/o difficulty. Affect pleasant.  ECG: today is v-paced  ASSESSMENT & PLAN:  1: NICM with reduced ejection fraction- - NYHA class III - euvolemic - etiology likely/ atrial fibrillation - weighing daily; Reminded to call for an overnight weight gain of >2 pounds or a weekly weight gain of >5 pounds - weight down 7 pounds since last visit here 2 months ago - TEE 11/09/22: EF 40-45% along with vegetation attached to RA pacemaker lead and mild/moderate AR. Echo 12/08/16: EF of 55-60% along with mild AR/MR. Previous echo was done 07/28/15 and showed an EF of 55-60% along with mild AR.  - cardiac catheterization done 11/20/12 and showed mild nonobstructive disease. - not adding salt to his food and wife is diligent about reading food labels - continue carvedilol 3.125mg  BID - continue entresto 24/26mg  BID - continue metolazone 2.5mg  PRN - continue potassium daily - continue torsemide 60mg  daily - saw cardiologist Mariah Milling) 04/24 - drinking 64 ounces of fluid daily - BNP 11/04/22 was 1216.0 - PharmD reconciled meds w/patient  2: HTN- - BP 106/56 - saw PCP Sampson Goon) 03/24 - BMP from 02/06/23 reviewed and showed sodium 141, potassium 4.0, creatinine 1.56  and GFR 46 - saw nephrology Thedore Mins) 11/23  3: Atrial fibrillation- - cardioversion done 11/21/16 & most recently 02/08/23 - saw EP Graciela Husbands) 04/24 - EKG today shows underlying atrial flutter v-paced - continue apixaban 5mg  BID  4: Parkinson's Disease- - continues to have tremors - saw neurologist Malvin Johns) 04/24  Return in 6 months, sooner if needed.

## 2023-02-28 ENCOUNTER — Encounter: Payer: Medicare Other | Admitting: Internal Medicine

## 2023-03-01 NOTE — Progress Notes (Signed)
Remote pacemaker transmission.   

## 2023-03-16 ENCOUNTER — Encounter: Payer: Self-pay | Admitting: Urology

## 2023-03-16 ENCOUNTER — Ambulatory Visit: Payer: Medicare Other | Admitting: Urology

## 2023-03-16 VITALS — BP 103/63 | HR 71 | Ht 72.0 in | Wt 247.0 lb

## 2023-03-16 DIAGNOSIS — N3281 Overactive bladder: Secondary | ICD-10-CM

## 2023-03-16 DIAGNOSIS — N4883 Acquired buried penis: Secondary | ICD-10-CM

## 2023-03-16 NOTE — Progress Notes (Signed)
03/16/23 8:42 AM   Matthew Howe [facility] [birth year] [id number]  CC: Overactive bladder, urge incontinence, buried penis  HPI: 77 year old comorbid male with obesity and BMI of 34, CHF on 40 mg torsemide, Parkinson's disease who presents with the above issues.  He reports at least 1 to 2 years of urgency and urge incontinence day and night that requires a depends.  He has been on oxybutynin 5 mg daily through PCP for about a year, he is unsure if this has been helpful.  He drinks flavored drinks and soda, and a high volume of water at least 65 ounces a day.  He denies any dysuria, gross hematuria, or UTIs.  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  Urinalysis in January 2024 benign, CT at that time also benign with decompressed bladder.   PMH: Past Medical History:  Diagnosis Date   Arthritis    Atrial fibrillation (HCC)    Atrial fibrillation (HCC)    Carpal tunnel syndrome on both sides    CHF (congestive heart failure) (HCC)    CKD (chronic kidney disease) 10/2018   recent diagnosis   Complete heart block (HCC)    a. s/p St. Jude PPM 11/2014.   Complication of anesthesia    stayed overnight dt under too long d/t taking Parkinson's medication (2013?)   Coronary artery disease    GERD (gastroesophageal reflux disease)    "not as bad as it used to be"   Heart murmur    History of recent blood transfusion 07/2018   Hyperlipidemia    Hypertension    IBS (irritable bowel syndrome)    Lower extremity edema    Parkinson's disease    Presence of permanent cardiac pacemaker    St Jude   Shortness of breath dyspnea     Surgical History: Past Surgical History:  Procedure Laterality Date   APPENDECTOMY  12/2010   BACK SURGERY  08/06/2018   lower lumbarsacral region, has rods in place. (done at [facility])   CARDIAC CATHETERIZATION  11/20/2012   Known CAD with one vessel coronary disease of left descending artery by cardiac cath.    CARDIOVERSION N/A  11/21/2016   Procedure: CARDIOVERSION;  Surgeon: Duke Salvia, MD;  Location: [facility] ORS;  Service: Cardiovascular;  Laterality: N/A;   CARDIOVERSION N/A 03/08/2019   Procedure: CARDIOVERSION;  Surgeon: Pricilla Riffle, MD;  Location: [facility] ENDOSCOPY;  Service: Cardiovascular;  Laterality: N/A;   CARDIOVERSION N/A 02/08/2023   Procedure: CARDIOVERSION;  Surgeon: Debbe Odea, MD;  Location: [facility] ORS;  Service: Cardiovascular;  Laterality: N/A;   CARPAL TUNNEL RELEASE Right 10/22/2018   Procedure: CARPAL TUNNEL RELEASE;  Surgeon: Donato Heinz, MD;  Location: [facility] ORS;  Service: Orthopedics;  Laterality: Right;   CARPAL TUNNEL RELEASE Left 05/22/2019   Procedure: CARPAL TUNNEL RELEASE - LEFT;  Surgeon: Donato Heinz, MD;  Location: [facility] ORS;  Service: Orthopedics;  Laterality: Left;   COLONOSCOPY  2018   COLONOSCOPY N/A 10/21/2020   Procedure: COLONOSCOPY;  Surgeon: Earline Mayotte, MD;  Location: [facility] ENDOSCOPY;  Service: Endoscopy;  Laterality: N/A;   HERNIA REPAIR  10/23/2019   umbilical hernia repair   INSERT / REPLACE / REMOVE PACEMAKER     PACEMAKER INSERTION  11-21-14   STJ Assurity dual chamber pacemaker implanted by Dr Johney Frame for CHB   PERMANENT PACEMAKER INSERTION N/A 11/21/2014   Procedure: PERMANENT PACEMAKER INSERTION;  Surgeon: Hillis Range, MD;  Location: [facility] CATH  LAB;  Service: Cardiovascular;  Laterality: N/A;   TEE WITHOUT CARDIOVERSION N/A 11/21/2016   Procedure: TRANSESOPHAGEAL ECHOCARDIOGRAM (TEE);  Surgeon: Duke Salvia, MD;  Location: [facility] ORS;  Service: Cardiovascular;  Laterality: N/A;   TEE WITHOUT CARDIOVERSION N/A 11/09/2022   Procedure: TRANSESOPHAGEAL ECHOCARDIOGRAM;  Surgeon: Debbe Odea, MD;  Location: [facility] ORS;  Service: Cardiovascular;  Laterality: N/A;   UPPER GI ENDOSCOPY      Family History: Family History  Problem Relation Age of Onset   Heart disease Mother    Heart disease Father    Colon cancer Father    Stroke Sister     Social  History:  reports that he has never smoked. He has never been exposed to tobacco smoke. He has never used smokeless tobacco. He reports that he does not drink alcohol and does not use drugs.  Physical Exam: BP 103/63   Pulse 71   Ht 6' (1.829 m)   Wt 247 lb (112 kg)   BMI 33.50 kg/m    Constitutional:  Alert and oriented, No acute distress.  Obese, in wheelchair Cardiovascular: No clubbing, cyanosis, or edema. Respiratory: Normal respiratory effort, no increased work of breathing. 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  Urinalysis benign, no worrisome findings on CT.  I had a very frank conversation with the patient and his wife that he has a number of factors contributing to his urinary symptoms including his obesity, CHF on diuretics, Parkinson's disease, poor mobility, and high fluid intake.  In terms of his buried penis, I recommended weight loss.  We discussed that overactive bladder (OAB) is not a disease, but is a symptom complex that is generally not life-threatening.  Symptoms typically include urinary urgency, frequency, and urge incontinence.  There are numerous treatment options, however there are risks and benefits with both medical and surgical management.  First-line treatment is behavioral therapies including bladder training, pelvic floor muscle training, and fluid management.  Second line treatments include oral antimuscarinics(Ditropan er, Trospium) and beta-3 agonist (Mybetriq).  There is typically a period of medication trial (4-8 weeks) to find the optimal therapy and dosing. If symptoms are bothersome despite the above management, third line options include intra-detrusor botox, peripheral tibial nerve stimulation (PTNS), and interstim (SNS). These are more invasive treatments with higher side effect profile, but may improve quality of life for patients with severe OAB symptoms.   -Behavioral strategies discussed extensively including minimizing bladder irritants and decreasing overall fluid intake, especially in the setting of torsemide diuretic -Elevation of the legs in the afternoon to prevent nocturia -Discontinue oxybutynin, trial of Gemtesa, samples given -RTC 6 weeks PVR symptom check  Legrand Rams, MD 03/16/2023  [facility] [address] [place], [place] [address] [phone]

## 2023-03-16 NOTE — Patient Instructions (Signed)
Avoid carbonated or flavored drinks disease worsening urinary symptoms.  Cut back all fluid intake 4 hours prior to bedtime, and urinate prior to bed.  We will replace your oxybutynin with Leslye Peer which is a better overactive bladder medication, this can take a few weeks to work.  Weight loss will help with your urinary symptoms, as well as the buried penis.  Try to elevate your legs in the mid afternoon, and this will help with the overnight urination as well.

## 2023-03-19 NOTE — Progress Notes (Unsigned)
Cardiology Office Note  Date:  03/19/2023   ID:  Matthew Howe, Matthew Howe [birth year], MRN [id number]  PCP:  Mick Sell, MD   No chief complaint on file.   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  Found to have atrial fibrillation 11/22/2014 on pacemaker download, started on anticoagulation    hospitalization for paroxysmal atrial fibrillation/acute on chronic diastolic CHF Echocardiogram January 2024 EF 40 to 45% He presents today for weight gain, shortness of breath, leg edema, chronic diastolic CHF,Paroxysmal atrial fibrillation  Last seen by myself in clinic April 2024 Seen by EP February 02, 2023 Underwent cardioversion for atrial for flutter Feb 08, 2023     Hospitalized January 2024 sepsis, cellulitis, strep pneumo bacteremia Ejection fraction 40% Started on Entresto, Coreg, Lasix 60 daily, metolazone as needed Flecainide held in the setting of cardiomyopathy Continue on carvedilol, Eliquis Transesophageal echo completed, lead extraction not performed given comorbidities Treated with IV antibiotics through PICC line  Completed 2 weeks ABX Has had follow-up in CHF clinic Working with PT, slow recovery, slower recovery secondary to Parkinson's Leg swelling has persisted  Weight relatively stable at home 250 to 255 Taking Lasix 60 daily Sedentary, legs down Has new sleeper chair, wears compression hose  Was previously on torsemide prior to hospitalization, felt this worked better, requesting to switch back.  Was sometimes taking up to 4 torsemide a day  Reports he is asymptomatic from his atrial fibs/flutter  EKG personally reviewed by myself on todays visit Paced rhythm underlying fib flutter  Prior arrhythmia history Persistent atrial fib  since 08/2018 noted on pacer download Cardioversion for atrial fib 03/08/2019 seen by Dr. Graciela Husbands 11/2018 , maintaining normal sinus rhythm on flecainide Flecainide held January 2024 Back in atrial flutter on EKG January 31, 2023  Recovered from hernia surgery January 2021  Echo 03/15/2019 ejection fraction of 50-55%.   presented to the hospital 07/28/2015 with abdominal bloating, shortness of breath Was treated with diureticswith improvement of his symptoms    leads a busy lifestyle, continues to work most days of the week. Fatigue, tremor Followed by neurology, Dr. Malvin Johns for Parkinson's Previous issues with constipation   He presented to [facility] with bradycardia February 2016. This seemed to improve without intervention and he is asymptomatic. He had recurrent symptoms and we referred him to [facility] for further evaluation. He was seen in the emergency room and found to be in heart block, pacemaker placed urgently    Previous Stress echo leading to cardiac catheterization at [facility].   Stress echo was a limited study, ejection fraction estimated at 45% with EF of 50% at peak stress.   Cardiac catheterization 11/20/2012 with 25% proximal LAD disease, followed by 40% lesion. The cardiac catheterization note makes mention of atrial flutter though arrhythmia is not mentioned in any of the clinic notes.   PMH:   has a past medical history of Arthritis, Atrial fibrillation (HCC), Atrial fibrillation (HCC), Carpal tunnel syndrome on both sides, CHF (congestive heart failure) (HCC), CKD (chronic kidney disease) (10/2018), Complete heart block (HCC), Complication of anesthesia, Coronary artery disease, GERD (gastroesophageal reflux disease), Heart murmur, History of recent blood transfusion (07/2018), Hyperlipidemia, Hypertension, IBS (irritable bowel syndrome), Lower extremity edema, Parkinson's disease, Presence of permanent cardiac pacemaker, and Shortness of breath dyspnea.  PSH:  Past  Surgical History:  Procedure Laterality Date   APPENDECTOMY  12/2010   BACK SURGERY  08/06/2018   lower lumbarsacral region, has rods in place. (done at [facility])   CARDIAC CATHETERIZATION  11/20/2012   Known CAD with one vessel coronary disease of left descending artery by cardiac cath.    CARDIOVERSION N/A 11/21/2016   Procedure: CARDIOVERSION;  Surgeon: Duke Salvia, MD;  Location: [facility] ORS;  Service: Cardiovascular;  Laterality: N/A;   CARDIOVERSION N/A 03/08/2019   Procedure: CARDIOVERSION;  Surgeon: Pricilla Riffle, MD;  Location: [facility] ENDOSCOPY;  Service: Cardiovascular;  Laterality: N/A;   CARDIOVERSION N/A 02/08/2023   Procedure: CARDIOVERSION;  Surgeon: Debbe Odea, MD;  Location: [facility] ORS;  Service: Cardiovascular;  Laterality: N/A;   CARPAL TUNNEL RELEASE Right 10/22/2018   Procedure: CARPAL TUNNEL RELEASE;  Surgeon: Donato Heinz, MD;  Location: [facility] ORS;  Service: Orthopedics;  Laterality: Right;   CARPAL TUNNEL RELEASE Left 05/22/2019   Procedure: CARPAL TUNNEL RELEASE - LEFT;  Surgeon: Donato Heinz, MD;  Location: [facility] ORS;  Service: Orthopedics;  Laterality: Left;   COLONOSCOPY  2018   COLONOSCOPY N/A 10/21/2020   Procedure: COLONOSCOPY;  Surgeon: Earline Mayotte, MD;  Location: [facility] ENDOSCOPY;  Service: Endoscopy;  Laterality: N/A;   HERNIA REPAIR  10/23/2019   umbilical hernia repair   INSERT / REPLACE / REMOVE PACEMAKER     PACEMAKER INSERTION  11-21-14   [employer]J Assurity dual chamber pacemaker implanted by Dr Johney Frame for CHB   PERMANENT PACEMAKER INSERTION N/A 11/21/2014   Procedure: PERMANENT PACEMAKER INSERTION;  Surgeon: Hillis Range, MD;  Location: [facility] CATH LAB;  Service: Cardiovascular;  Laterality: N/A;   TEE WITHOUT CARDIOVERSION N/A 11/21/2016   Procedure: TRANSESOPHAGEAL ECHOCARDIOGRAM (TEE);  Surgeon: Duke Salvia, MD;  Location: [facility] ORS;  Service: Cardiovascular;  Laterality: N/A;   TEE WITHOUT CARDIOVERSION N/A 11/09/2022   Procedure: TRANSESOPHAGEAL  ECHOCARDIOGRAM;  Surgeon: Debbe Odea, MD;  Location: [facility] ORS;  Service: Cardiovascular;  Laterality: N/A;   UPPER GI ENDOSCOPY      Current Outpatient Medications  Medication Sig Dispense Refill   acetaminophen (TYLENOL) 500 MG tablet Take 650 mg by mouth in the morning, at noon, and at bedtime.     allopurinol (ZYLOPRIM) 300 MG tablet Take 400 mg by mouth daily. 300 mg tablet + 100 mg tablet once daily  3   bisacodyl (DULCOLAX) 5 MG EC tablet Take 5 mg by mouth 2 (two) times daily.     carboxymethylcellulose (REFRESH PLUS) 0.5 % SOLN Place 1 drop into both eyes daily as needed.     carvedilol (COREG) 3.125 MG tablet Take 3.125 mg by mouth 2 (two) times daily.     desloratadine (CLARINEX) 5 MG tablet Take 5 mg by mouth at bedtime.      ELIQUIS 5 MG TABS tablet TAKE 1 TABLET BY MOUTH TWICE A DAY 180 tablet 1   FLUoxetine (PROZAC) 10 MG capsule Take 10 mg by mouth daily.     fluticasone (FLONASE) 50 MCG/ACT nasal spray Place 2 sprays into the nose daily as needed for allergies.      lansoprazole (PREVACID) 15 MG capsule Take 15 mg by mouth daily as needed (indigestion).      magnesium oxide (MAG-OX) 400 MG tablet Take 400 mg by mouth at bedtime.     metolazone (ZAROXOLYN) 2.5 MG tablet TAKE 1 TABLET BY MOUTH DAILY AS NEEDED (ONCE DAILY AS NEEDED FOR WEIGHT GAIN OR SWELLING.). 90 tablet 3  Multiple Vitamins-Minerals (OCUVITE ADULT 50+ PO) Take 1 tablet by mouth daily.     polyethylene glycol (MIRALAX / GLYCOLAX) packet Take 17 g by mouth daily as needed (constipation.).      potassium chloride (KLOR-CON M) 10 MEQ tablet Take 2 tablets (20 mEq total) by mouth daily. take additional 2 tablets (20 meq) as needed daily with extra dose of torsemide (Patient taking differently: Take 30 mEq by mouth daily. take additional 2 tablets (20 meq) as needed daily with extra dose of torsemide) 260 tablet 3   pramipexole (MIRAPEX) 1 MG tablet Take 1-2 mg by mouth 3 (three) times daily. 2 tablets QAM, 1  tablet at lunch, and 1 tablet at night     pregabalin (LYRICA) 25 MG capsule Take 25 mg by mouth every other day.     Red Yeast Rice 600 MG CAPS Take 600 mg by mouth daily.     sacubitril-valsartan (ENTRESTO) 24-26 MG Take 1 tablet by mouth 2 (two) times daily. 180 tablet 3   selegiline (ELDEPRYL) 5 MG capsule Take 5 mg by mouth 2 (two) times daily with a meal.      SENEXON-S 8.6-50 MG tablet Take 2 tablets by mouth 2 (two) times daily.     torsemide (DEMADEX) 20 MG tablet Take 60 mg by mouth daily.     No current facility-administered medications for this visit.     Allergies:   Linaclotide, Codeine, Terazosin, Gabapentin, Hydrocodone-acetaminophen, Lubiprostone, Norco [hydrocodone-acetaminophen], Omeprazole, Oxycodone, and Ropinirole   Social History:  The patient  reports that he has never smoked. He has never been exposed to tobacco smoke. He has never used smokeless tobacco. He reports that he does not drink alcohol and does not use drugs.   Family History:   family history includes Colon cancer in his father; Heart disease in his father and mother; Stroke in his sister.    Review of Systems: Review of Systems  Constitutional: Negative.   HENT: Negative.    Respiratory:  Positive for shortness of breath.   Cardiovascular:  Positive for leg swelling.  Gastrointestinal: Negative.   Musculoskeletal: Negative.        Leg weakness  Neurological: Negative.   Psychiatric/Behavioral: Negative.    All other systems reviewed and are negative.   PHYSICAL EXAM: VS:  There were no vitals taken for this visit. , BMI There is no height or weight on file to calculate BMI. Constitutional:  oriented to person, place, and time. No distress.  HENT:  Head: Grossly normal Eyes:  no discharge. No scleral icterus.  Neck: No JVD, no carotid bruits  Cardiovascular: Regular rate and rhythm, no murmurs appreciated Pulmonary/Chest: Clear to auscultation bilaterally, no wheezes or rails Abdominal:  Soft.  no distension.  no tenderness.  Musculoskeletal: Normal range of motion Neurological:  normal muscle tone. Coordination normal. No atrophy Skin: Skin warm and dry Psychiatric: normal affect, pleasant  Recent Labs: 11/04/2022: ALT 18; B Natriuretic Peptide 1,216.0 02/06/2023: BUN 36; Creatinine, Ser 1.56; Hemoglobin 15.1; Platelets 239; Potassium 4.0; Sodium 141    Lipid Panel Lab Results  Component Value Date   CHOL 188 03/23/2016   HDL 46 03/23/2016   LDLCALC 114 (H) 03/23/2016   TRIG 138 03/23/2016      Wt Readings from Last 3 Encounters:  03/16/23 247 lb (112 kg)  02/22/23 252 lb (114.3 kg)  02/08/23 250 lb (113.4 kg)      ASSESSMENT AND PLAN:  Essential hypertension -  Blood pressure is well controlled  on today's visit. No changes made to the medications.  Paroxysmal atrial fibrillation (HCC) - Successful cardioversion May 2020  off flecainide 100 twice daily  Remains on Coreg 6.25 twice daily Paced rhythm with underlying flutter noted on today's visit Has follow-up with Dr. Graciela Husbands in 2 days time Consider cardioversion, may need to consider antiarrhythmic such as amiodarone given he is off flecainide in the setting of cardiomyopathy  Coronary artery disease involving native coronary artery of native heart without angina pectoris Currently with no symptoms of angina. No further workup at this time. Continue current medication regimen.  Complete heart block (HCC) Pacemaker in place, followed by Dr. Graciela Husbands  Mixed hyperlipidemia Low carbohydrate diet recommended  Constipation Likely related to his Parkinson's disease and dehydration, Stable on senna and Dulcolax  Chronic diastolic and systolic heart failure (HCC) Prefers to be on torsemide, we will hold Lasix and changed back to torsemide 40 daily Extra 40 torsemide after lunch for weight 258 or higher followed by nephrology,  Continue Sherryll Burger, Coreg Consider Farxiga in follow-up  Parkinson's disease  (HCC) Stable Followed by  Dr. Malvin Johns Difficulty recovering after recent hospitalization for sepsis  Morbid obesity (HCC) Low carbohydrate recommended, exercise limited  Shortness of breath Stable weight 250 up to 255 pounds Deconditioning playing a role, now with atrial flutter   Total encounter time more than 40 minutes  Greater than 50% was spent in counseling and coordination of care with the patient   No orders of the defined types were placed in this encounter.     Signed, Dossie Arbour, M.D., Ph.D. 03/19/2023  [facility] [place], [place] [phone]

## 2023-03-20 ENCOUNTER — Ambulatory Visit: Payer: Medicare Other | Attending: Cardiovascular Disease | Admitting: Cardiovascular Disease

## 2023-03-20 ENCOUNTER — Encounter: Payer: Self-pay | Admitting: Cardiovascular Disease

## 2023-03-20 VITALS — BP 110/64 | HR 70 | Ht 72.0 in | Wt 252.2 lb

## 2023-03-20 DIAGNOSIS — I77810 Thoracic aortic ectasia: Secondary | ICD-10-CM

## 2023-03-20 DIAGNOSIS — I25118 Atherosclerotic heart disease of native coronary artery with other forms of angina pectoris: Secondary | ICD-10-CM

## 2023-03-20 DIAGNOSIS — G20A1 Parkinson's disease without dyskinesia, without mention of fluctuations: Secondary | ICD-10-CM

## 2023-03-20 DIAGNOSIS — I10 Essential (primary) hypertension: Secondary | ICD-10-CM | POA: Diagnosis present

## 2023-03-20 DIAGNOSIS — I5032 Chronic diastolic (congestive) heart failure: Secondary | ICD-10-CM

## 2023-03-20 DIAGNOSIS — I442 Atrioventricular block, complete: Secondary | ICD-10-CM | POA: Diagnosis present

## 2023-03-20 DIAGNOSIS — Z950 Presence of cardiac pacemaker: Secondary | ICD-10-CM | POA: Diagnosis present

## 2023-03-20 DIAGNOSIS — I5022 Chronic systolic (congestive) heart failure: Secondary | ICD-10-CM

## 2023-03-20 DIAGNOSIS — I4892 Unspecified atrial flutter: Secondary | ICD-10-CM

## 2023-03-20 DIAGNOSIS — I4819 Other persistent atrial fibrillation: Secondary | ICD-10-CM | POA: Diagnosis present

## 2023-03-20 NOTE — Patient Instructions (Signed)
Appt with Dr. Graciela Husbands for afib, follow up  Medication Instructions:  No changes  If you need a refill on your cardiac medications before your next appointment, please call your pharmacy.   Lab work: No new labs needed  Testing/Procedures: No new testing needed  Follow-Up: At [facility], you and your health needs are our priority.  As part of our continuing mission to provide you with exceptional heart care, we have created designated Provider Care Teams.  These Care Teams include your primary Cardiologist (physician) and Advanced Practice Providers (APPs -  Physician Assistants and Nurse Practitioners) who all work together to provide you with the care you need, when you need it.  You will need a follow up appointment in 6 months  Providers on your designated Care Team:   Nicolasa Ducking, NP Eula Listen, PA-C Cadence Fransico Michael, [place]  COVID-19 Vaccine Information can be found at: [email or web address] For questions related to vaccine distribution or appointments, please email [email or web address] or call [phone].

## 2023-03-21 ENCOUNTER — Encounter: Payer: Self-pay | Admitting: Family

## 2023-03-27 ENCOUNTER — Telehealth: Payer: Self-pay | Admitting: Cardiovascular Disease

## 2023-03-27 DIAGNOSIS — Z79899 Other long term (current) drug therapy: Secondary | ICD-10-CM

## 2023-03-27 NOTE — Telephone Encounter (Signed)
Patient's wife does not have the names of the medications, but she states PCP, Dr. Sampson Goon, advised to stop taking all 3 fluid pills and to increase fluid intake from 60 oz daily. Per wife, PCP did not mention a specific amount to increase to.

## 2023-03-28 NOTE — Telephone Encounter (Signed)
Called and spoke with wife. Gave her the following recommendations from Dr. Mariah Milling.  Appears lab work from Dr. Sampson Goon, creatinine went high 2.2 which is above his baseline  Would recommend holding torsemide and metolazone for 1 week with recheck of BMP at that time Once creatinine comes back down to baseline level, we can likely restart the torsemide Torsemide restart would start 40 mg daily alternating with 60 rather than 60 daily May need to increase fluids slightly to avoid dehydration Appears appointment with Dr. Graciela Husbands in early July to discuss atrial flutter which can contribute to shortness of breath symptoms Thx TGollan        Wife verbalizes understanding.

## 2023-04-03 ENCOUNTER — Telehealth: Payer: Self-pay | Admitting: Cardiovascular Disease

## 2023-04-03 NOTE — Telephone Encounter (Signed)
Calling about order for meds change for the patient. Order #[id number]. Please advise

## 2023-04-04 ENCOUNTER — Encounter: Payer: Self-pay | Admitting: Cardiovascular Disease

## 2023-04-04 NOTE — Telephone Encounter (Signed)
Called and spoke with Angie. Order for [facility] was faxed on 03/28/23 but was not received. Order faxed to [facility] today.

## 2023-04-07 NOTE — Telephone Encounter (Signed)
Called and spoke to wife Dennie Bible. Informed wife of the following recommendations.  Renal function has improved back to baseline  Would restart torsemide 40 daily alternating with 60  Appears he is also taking potassium 30 daily  Thx  TG   Wife verbalized understanding.

## 2023-04-07 NOTE — Telephone Encounter (Signed)
Called and spoke with wife and gave the following recommendations from Dr. Mariah Milling.   Renal function has improved back to baseline  Would restart torsemide 40 daily alternating with 60  Appears he is also taking potassium 30 daily  Thx  TG   Wife verbalizes understanding.

## 2023-04-13 ENCOUNTER — Other Ambulatory Visit: Payer: Self-pay | Admitting: Cardiovascular Disease

## 2023-04-18 ENCOUNTER — Encounter: Payer: Self-pay | Admitting: Internal Medicine

## 2023-04-18 ENCOUNTER — Ambulatory Visit: Payer: Medicare Other | Attending: Internal Medicine | Admitting: Internal Medicine

## 2023-04-18 VITALS — BP 147/68 | HR 70 | Ht 72.0 in | Wt 250.0 lb

## 2023-04-18 DIAGNOSIS — I4819 Other persistent atrial fibrillation: Secondary | ICD-10-CM | POA: Diagnosis present

## 2023-04-18 DIAGNOSIS — Z950 Presence of cardiac pacemaker: Secondary | ICD-10-CM | POA: Diagnosis present

## 2023-04-18 DIAGNOSIS — I442 Atrioventricular block, complete: Secondary | ICD-10-CM

## 2023-04-18 LAB — CUP PACEART INCLINIC DEVICE CHECK
Date Time Interrogation Session: 20240709123814
Implantable Lead Connection Status: 753985
Implantable Lead Connection Status: 753985
Implantable Lead Implant Date: 20160212
Implantable Lead Implant Date: 20160212
Implantable Lead Location: 753859
Implantable Lead Location: 753860
Implantable Lead Model: 1948
Implantable Pulse Generator Implant Date: 20160212
Pulse Gen Model: 2240
Pulse Gen Serial Number: 3049931

## 2023-04-18 MED ORDER — AMIODARONE HCL 200 MG PO TABS: ORAL_TABLET | ORAL | 3 refills | Status: DC

## 2023-04-18 NOTE — Progress Notes (Signed)
Patient Care Team: Mick Sell, MD as PCP - General (Infectious Diseases) Antonieta Iba, MD as PCP - Cardiology (Cardiology) Duke Salvia, MD as PCP - Electrophysiology (Cardiology) Delma Freeze, FNP as Nurse Practitioner (Cardiology) Antonieta Iba, MD as Consulting Physician (Cardiology) Morene Crocker, MD as Referring Physician (Neurology)   HPI  Matthew Howe is a 77 y.o. male Seen following pacemaker implantation Abbott 2/16 by Dr. Fawn Kirk For complete heart block.  History of atrial fibrillation on flecainide with anticoagulation with apixaban;  flecainide stopped 2/24 in the setting of an admission for sepsis and respiratory failure.  He underwent TEE because of streptococcal pneumoniae bacteremia.? 2/2  cellulitis.  Concern for vegetation on the RA lead but a fibrionous strand with low specificity for vegetation.  Outpatient antibiotics were recommended, repeat cultures were negative.  Extraction was not pursued because of comorbidities  anticoagulation with Apixaban   DC cardioversion 5/24 successful unfortunately held for only about 3 days; it was the patient's impression as well as his wife's that he felt better.  Over recent days he has been feeling considerably worse with increasing lassitude swelling of feet and abdomen dyspnea and fatigue.  In early June his creatinine had increased to 2.2 and his diuretics were held for a week.  During this time he began to feel worse and it has continued.  He is on 2 L fluid restriction but still accumulating edema.  Also quite despondent    . DATE TEST EF   2/14 LHC  Non obstructive CAD  10/16 echo   55-60 %   3/18 Echo   55-60 %   6/20 Echo  55-60  Asc Ao 3.9 LVH mild LAE (-/53/38)  1/24 Echo  40-45%       Date Cr K Hgb  2/18 1.36   14.7  1/19  0.38 4.5 13.3  3/19     12.4  12/19 2.29 4.4 10.7  5/20 2.0 4.3 13.2  8/20 2.3    12/20 1.53 4.4 13.8  6/21 1.6 4.4 14.6  6/22 1.7 4.6 14.2  3/24  1.63 4.0 14.6  6/24 1.4<<2.2 4.1           Past Medical History:  Diagnosis Date   Arthritis    Atrial fibrillation [facility])    Atrial fibrillation (HCC)    Carpal tunnel syndrome on both sides    CHF (congestive heart failure) (HCC)    CKD (chronic kidney disease) 10/2018   recent diagnosis   Complete heart block (HCC)    a. s/p St. Jude PPM 11/2014.   Complication of anesthesia    stayed overnight dt under too long d/t taking Parkinson's medication (2013?)   Coronary artery disease    GERD (gastroesophageal reflux disease)    "not as bad as it used to be"   Heart murmur    History of recent blood transfusion 07/2018   Hyperlipidemia    Hypertension    IBS (irritable bowel syndrome)    Lower extremity edema    Parkinson's disease    Presence of permanent cardiac pacemaker    St Jude   Shortness of breath dyspnea     Past Surgical History:  Procedure Laterality Date   APPENDECTOMY  12/2010   BACK SURGERY  08/06/2018   lower lumbarsacral region, has rods in place. (done at [facility])   CARDIAC CATHETERIZATION  11/20/2012   Known CAD with one vessel coronary disease of left descending  artery by cardiac cath.    CARDIOVERSION N/A 11/21/2016   Procedure: CARDIOVERSION;  Surgeon: Duke Salvia, MD;  Location: [facility] ORS;  Service: Cardiovascular;  Laterality: N/A;   CARDIOVERSION N/A 03/08/2019   Procedure: CARDIOVERSION;  Surgeon: Pricilla Riffle, MD;  Location: [facility] ENDOSCOPY;  Service: Cardiovascular;  Laterality: N/A;   CARDIOVERSION N/A 02/08/2023   Procedure: CARDIOVERSION;  Surgeon: Debbe Odea, MD;  Location: [facility] ORS;  Service: Cardiovascular;  Laterality: N/A;   CARPAL TUNNEL RELEASE Right 10/22/2018   Procedure: CARPAL TUNNEL RELEASE;  Surgeon: Donato Heinz, MD;  Location: [facility] ORS;  Service: Orthopedics;  Laterality: Right;   CARPAL TUNNEL RELEASE Left 05/22/2019   Procedure: CARPAL TUNNEL RELEASE - LEFT;  Surgeon: Donato Heinz, MD;  Location: [facility] ORS;   Service: Orthopedics;  Laterality: Left;   COLONOSCOPY  2018   COLONOSCOPY N/A 10/21/2020   Procedure: COLONOSCOPY;  Surgeon: Earline Mayotte, MD;  Location: [facility] ENDOSCOPY;  Service: Endoscopy;  Laterality: N/A;   HERNIA REPAIR  10/23/2019   umbilical hernia repair   INSERT / REPLACE / REMOVE PACEMAKER     PACEMAKER INSERTION  11-21-14   STJ Assurity dual chamber pacemaker implanted by Dr Johney Frame for CHB   PERMANENT PACEMAKER INSERTION N/A 11/21/2014   Procedure: PERMANENT PACEMAKER INSERTION;  Surgeon: Hillis Range, MD;  Location: [facility] CATH LAB;  Service: Cardiovascular;  Laterality: N/A;   TEE WITHOUT CARDIOVERSION N/A 11/21/2016   Procedure: TRANSESOPHAGEAL ECHOCARDIOGRAM (TEE);  Surgeon: Duke Salvia, MD;  Location: [facility] ORS;  Service: Cardiovascular;  Laterality: N/A;   TEE WITHOUT CARDIOVERSION N/A 11/09/2022   Procedure: TRANSESOPHAGEAL ECHOCARDIOGRAM;  Surgeon: Debbe Odea, MD;  Location: [facility] ORS;  Service: Cardiovascular;  Laterality: N/A;   UPPER GI ENDOSCOPY      Current Outpatient Medications  Medication Sig Dispense Refill   acetaminophen (TYLENOL) 500 MG tablet Take 650 mg by mouth in the morning, at noon, and at bedtime.     allopurinol (ZYLOPRIM) 300 MG tablet Take 400 mg by mouth daily. 300 mg tablet + 100 mg tablet once daily  3   bisacodyl (DULCOLAX) 5 MG EC tablet Take 5 mg by mouth 2 (two) times daily.     carboxymethylcellulose (REFRESH PLUS) 0.5 % SOLN Place 1 drop into both eyes daily as needed.     carvedilol (COREG) 3.125 MG tablet Take 3.125 mg by mouth 2 (two) times daily.     cholecalciferol (VITAMIN D3) 25 MCG (1000 UNIT) tablet Take 2,000 Units by mouth daily.     Cyanocobalamin (VITAMIN B 12) 500 MCG TABS Take 1,000 mcg by mouth daily.     desloratadine (CLARINEX) 5 MG tablet Take 5 mg by mouth at bedtime.      ELIQUIS 5 MG TABS tablet TAKE 1 TABLET BY MOUTH TWICE A DAY 180 tablet 1   FLUoxetine (PROZAC) 10 MG capsule Take 20 mg by mouth daily.      fluticasone (FLONASE) 50 MCG/ACT nasal spray Place 2 sprays into the nose daily as needed for allergies.      lansoprazole (PREVACID) 15 MG capsule Take 15 mg by mouth daily as needed (indigestion).      magnesium oxide (MAG-OX) 400 MG tablet Take 400 mg by mouth at bedtime.     metolazone (ZAROXOLYN) 2.5 MG tablet TAKE 1 TABLET BY MOUTH DAILY AS NEEDED (ONCE DAILY AS NEEDED FOR WEIGHT GAIN OR SWELLING.). 90 tablet 3   Multiple Vitamins-Minerals (OCUVITE ADULT 50+ PO) Take 1 tablet by mouth daily.  polyethylene glycol (MIRALAX / GLYCOLAX) packet Take 17 g by mouth daily as needed (constipation.).      potassium chloride (KLOR-CON) 10 MEQ tablet Take 30 mEq by mouth daily.     pramipexole (MIRAPEX) 1 MG tablet Take 1-2 mg by mouth 3 (three) times daily. 2 tablets QAM, 1 tablet at lunch, and 1 tablet at night     pregabalin (LYRICA) 25 MG capsule Take 25 mg by mouth every other day.     Red Yeast Rice 600 MG CAPS Take 600 mg by mouth daily.     sacubitril-valsartan (ENTRESTO) 24-26 MG TAKE 1 TABLET BY MOUTH TWICE A DAY 60 tablet 4   selegiline (ELDEPRYL) 5 MG capsule Take 5 mg by mouth 2 (two) times daily with a meal.      SENEXON-S 8.6-50 MG tablet Take 2 tablets by mouth 2 (two) times daily.     torsemide (DEMADEX) 20 MG tablet Take 60 mg by mouth daily.     Vibegron (GEMTESA) 75 MG TABS Take 75 mg by mouth daily.     No current facility-administered medications for this visit.    Allergies  Allergen Reactions   Linaclotide Shortness Of Breath and Other (See Comments)   Codeine Diarrhea, Nausea And Vomiting and Other (See Comments)   Terazosin Other (See Comments)    Dizziness / altered mental state  Dizziness / altered mental state  Dizziness / altered mental state    Gabapentin Other (See Comments)    dizziness Other reaction(s): Other (See Comments) dizziness   Hydrocodone-Acetaminophen Diarrhea, Nausea And Vomiting and Other (See Comments)   Lubiprostone Other (See Comments)     Abdominal cramps Other reaction(s): Other (See Comments) Abdominal cramps Abdominal cramps   Norco [Hydrocodone-Acetaminophen] Diarrhea and Nausea And Vomiting   Omeprazole Diarrhea, Nausea And Vomiting and Other (See Comments)    Stomach cramps   Oxycodone Nausea And Vomiting   Ropinirole Nausea Only and Other (See Comments)    Review of Systems negative except from HPI and PMH  Physical Exam BP (!) 147/68   Pulse 70   Ht 6' (1.829 m)   Wt 250 lb (113.4 kg)   SpO2 94%   BMI 33.91 kg/m  Well developed and well nourished in no acute distress HENT normal Neck supple with JVP->10 +HJR Clear decreased on the left Device pocket well healed; without hematoma or erythema.  There is no tethering  regular rate and rhythm gallop No  murmur Abd-soft with active BS No Clubbing cyanosis 3+ edema Skin-warm and dry A & Oriented  Grossly normal sensory and motor function  ECG atrial fibrillation with underlying ventricular pacing at 70  Device function is normal. Programming changes none See Paceart for details      Assessment and  Plan  Complete heart block  Sinus arrest  Atrial fibrillation-persistent  Pacemaker-St. Jude    Parkinson's disease  Hypertension  Renal insuff gd 3  HFpEF acute/chronic class IIIb  Aortic root Dilitation  Obesity  Persistent heart block.  Atrial fibrillation is also persistent having recurred 3 days following cardioversion.  Given his progressive deteriorating function and his thoughts that he was better in regular rhythm, we will initiate therapy with amiodarone and undertake cardioversion in about 3 weeks.  Have reviewed the benefits and side effects.  He is volume overloaded.  Not withstanding the issues of renal function that prompted the discontinuation of his diuretics 4 weeks ago, we are between a rock and a hard place of cardiorenal syndrome.  Will have him take furosemide 80 with 1 dose of Zaroxolyn 2.5 mg today.  Continue 60 mg  daily x 5 days and then resume his alternating.  Will have him seen in about 2 weeks and he will get blood work through his PCP next week.  His despondency is real.  He is on Prozac and has not the family's impression that his symptoms of fatigue have worsened with the Prozac up titration

## 2023-04-18 NOTE — Patient Instructions (Addendum)
Medication Instructions:  Your physician has recommended you make the following change in your medication:  1) START taking amiodarone 400 mg twice daily for 2 weeks, then decrease to 400 mg once daily for two weeks, then 200 mg once daily thereafter.  2) INCREASE torsemide to 80 mg today, then to 60 mg once daily for 5 days, then back to alternating with 60 mg and 40 mg every other day.  3) TAKE one tablet (2.5 mg) of metalozone once today  *If you need a refill on your cardiac medications before your next appointment, please call your pharmacy*  Lab Work: BMET and CBC  You will get your lab work at [facility]) hospital.  Your lab work will be done at [facility] next to [employer].  These are walk in labs- you will not need an appointment and you do not need to be fasting.    Testing/Procedures: Your physician has recommended that you have a Cardioversion (DCCV). Electrical Cardioversion uses a jolt of electricity to your heart either through paddles or wired patches attached to your chest. This is a controlled, usually prescheduled, procedure. Defibrillation is done under light anesthesia in the hospital, and you usually go home the day of the procedure. This is done to get your heart back into a normal rhythm. You are not awake for the procedure. Please see the instruction sheet given to you today.  Follow-Up: At [facility], you and your health needs are our priority.  As part of our continuing mission to provide you with exceptional heart care, we have created designated Provider Care Teams.  These Care Teams include your primary Cardiologist (physician) and Advanced Practice Providers (APPs -  Physician Assistants and Nurse Practitioners) who all work together to provide you with the care you need, when you need it.  Your next appointment:   2 weeks  Provider:   You may see Julien Nordmann, MD or one of the following Advanced Practice Providers on your  designated Care Team:   Nicolasa Ducking, NP Eula Listen, PA-C Cadence Fransico Michael, [place] Charlsie Quest, NP    Follow up with Dr. Graciela Husbands in 6 weeks.  Other Instructions You are scheduled for a Cardioversion on Wednesday, July 31st with Dr. Mariah Milling.    Please arrive at the [facility] Center Entrance of [facility], [address], [place] [address] at 6:30 am (This is 1 hour prior to your procedure time).  Proceed to the Check-In Desk directly inside the entrance.  Procedure Parking: Use the entrance off of the [address] side of the hospital. Turn right upon entering and follow the driveway to parking that is directly in front of the Heart & Vascular Center. There is no valet parking available at this entrance, however there is an awning directly in front of the Heart & Vascular Center for drop off/ pick up for patients  DIET: Nothing to eat or drink after midnight except a sip of water with medications (see medication instructions below)  Medication Instructions: Hold torsemide the morning of your procedure. All other medications can be taken with a sip of water.  Continue your anticoagulant: Eliquis If you miss a dose, please call us at [phone] You will need to continue your anticoagulant after your procedure until you are told by your provider that it is safe to stop.   Labs: You will get your lab work at [facility]) hospital.  Your lab work will be done at the [facility] next to [facility].  These are walk in labs- you will not need an appointment and you do not need to be fasting.     FYI: For your safety, and to allow [place] to monitor your vital signs accurately during the surgery/procedure we request that if you have artificial nails, gel coating, SNS etc. Please have those removed prior to your surgery/procedure. Not having the nail coverings /polish removed may result in cancellation or delay of your surgery/procedure.  You must have a responsible person to  drive you home and stay in the waiting area during your procedure. Failure to do so could result in cancellation.  Bring your insurance cards.  If you have any questions after you get home, please call the office at [phone]  *Special Note: Every effort is made to have your procedure done on time. Occasionally there are emergencies that occur at the hospital that may cause delays. Please be patient if a delay does occur.

## 2023-04-27 ENCOUNTER — Ambulatory Visit (INDEPENDENT_AMBULATORY_CARE_PROVIDER_SITE_OTHER): Payer: Medicare Other | Admitting: Urology

## 2023-04-27 ENCOUNTER — Encounter: Payer: Self-pay | Admitting: Urology

## 2023-04-27 VITALS — BP 83/44 | HR 71 | Ht 72.0 in

## 2023-04-27 DIAGNOSIS — N3941 Urge incontinence: Secondary | ICD-10-CM | POA: Diagnosis not present

## 2023-04-27 DIAGNOSIS — N3281 Overactive bladder: Secondary | ICD-10-CM | POA: Diagnosis not present

## 2023-04-27 LAB — BLADDER SCAN AMB NON-IMAGING

## 2023-04-27 MED ORDER — GEMTESA 75 MG PO TABS
75.0000 mg | ORAL_TABLET | Freq: Every day | ORAL | 11 refills | Status: DC
Start: 2023-04-27 — End: 2024-02-05

## 2023-04-27 NOTE — Progress Notes (Signed)
   04/27/2023 9:37 AM   Matthew Howe Nov 05, 1945 [id number]  Reason for visit: Follow up overactive bladder, urge incontinence  HPI: 77 year old comorbid male who I originally met in June 2024.  Comorbidities including obesity with BMI of 34, CHF on torsemide, Parkinson's disease who reports 1 to 2 years of urgency and urge incontinence day and night that requires a depends.  Previously had trialed oxybutynin 5 mg daily through PCP, unclear if that was ever very helpful.  He also drinks a fair amount of soda and flavored drinks and high volume of water.  Urinalysis was benign, CT in January 2024 benign with a decompressed bladder.  At our visit in June 2024 he opted for a trial of Gemtesa, and we also reviewed behavioral strategies including avoiding bladder irritants, minimizing fluids prior to bedtime, elevating the legs in the afternoon to prevent nocturia.  The history is obtained primarily from his wife today.  She reports significant improvement in his urinary symptoms and incontinence on the [place], and they would like to continue that medication.  PVR today is normal at 35ml.  Leslye Peer prescription sent in, if not affordable can trial Myrbetriq or mirabegron(should be generic within the next few months) -RTC PA 1 year   Matthew Come, MD  [facility] [address], [address] [place], [place] [address] [phone]

## 2023-05-02 ENCOUNTER — Ambulatory Visit (INDEPENDENT_AMBULATORY_CARE_PROVIDER_SITE_OTHER): Payer: Medicare Other

## 2023-05-02 DIAGNOSIS — I442 Atrioventricular block, complete: Secondary | ICD-10-CM | POA: Diagnosis not present

## 2023-05-04 LAB — CUP PACEART REMOTE DEVICE CHECK
Battery Remaining Longevity: 14 mo
Battery Remaining Percentage: 11 %
Brady Statistic AP VP Percent: 73 %
Brady Statistic AP VS Percent: 0 %
Brady Statistic AS VP Percent: 27 %
Brady Statistic AS VS Percent: 0 %
Brady Statistic RA Percent Paced: 1 %
Brady Statistic RV Percent Paced: 99 %
Date Time Interrogation Session: 20240723020016
Implantable Lead Connection Status: 753985
Implantable Lead Connection Status: 753985
Implantable Lead Implant Date: 20160212
Implantable Lead Implant Date: 20160212
Implantable Lead Location: 753859
Implantable Lead Location: 753860
Implantable Lead Model: 1948
Implantable Pulse Generator Implant Date: 20160212
Lead Channel Impedance Value: 480 Ohm
Lead Channel Impedance Value: 580 Ohm
Lead Channel Pacing Threshold Amplitude: 1.25 V
Lead Channel Pacing Threshold Pulse Width: 0.4 ms
Lead Channel Sensing Intrinsic Amplitude: 12 mV
Lead Channel Sensing Intrinsic Amplitude: 2.5 mV
Lead Channel Setting Pacing Amplitude: 1.5 V
Lead Channel Setting Pacing Amplitude: 1.625
Lead Channel Setting Pacing Pulse Width: 0.4 ms
Lead Channel Setting Sensing Sensitivity: 4 mV
Pulse Gen Model: 2240
Pulse Gen Serial Number: 3049931

## 2023-05-04 NOTE — H&P (View-Only) (Signed)
Cardiology Office Note  Date:  05/05/2023   ID:  Matthew Howe, Matthew Howe [birth year], MRN [id number]  PCP:  Matthew Sell, MD   Cc: Shortness of breath, fatigue   HPI:  Matthew Howe is a 77 year old gentleman with diagnosis of  Parkinson's who currently works delivering car parts,  Hypertension,  cardiac catheterization showing mild CAD , February 2014  EF 55% hyperlipidemia,  s/p  pacemaker placement.complete heart block.  Found to have atrial fibrillation 11/22/2014 on pacemaker download, started on anticoagulation    hospitalization for paroxysmal atrial fibrillation/acute on chronic diastolic CHF Echocardiogram January 2024 and TEE 1/24 EF 40 to 45% cardioversion for atrial for flutter Feb 08, 2023 He presents today for weight gain, shortness of breath, leg edema, chronic diastolic CHF,Paroxysmal atrial fibrillation  Last seen by myself in clinic 6/24 Recently seen by Matthew Howe started on amiodarone load 400 twice daily with plans for cardioversion next week Reports leg swelling has improved over the past month Currently alternating  torsemide 60/40 every other day Has not had to take any more metolazone Uses compression hose Reports energy is poor Has good days bad days but in general not able to do very much Presents today in a wheelchair  EKG personally reviewed by myself on todays visit EKG Interpretation Date/Time:  Friday May 05 2023 11:00:55 EDT Ventricular Rate:  70 PR Interval:    QRS Duration:  196 QT Interval:  554 QTC Calculation: 598 R Axis:   -82  Text Interpretation: Ventricular-paced rhythm When compared with ECG of 18-Apr-2023 10:54, No significant change was found Confirmed by Matthew Howe [phone]) on 05/05/2023 11:14:45 AM    Other past medical history reviewed Hospitalized January 2024 sepsis, cellulitis, strep pneumo bacteremia Ejection fraction 40% Started on Entresto, Coreg, Lasix 60 daily, metolazone as needed Flecainide held in the setting  of cardiomyopathy Continue on carvedilol, Eliquis Transesophageal echo completed, lead extraction not performed given comorbidities Treated with IV antibiotics through PICC line  Completed 2 weeks ABX Has had follow-up in CHF clinic Working with PT, slow recovery, slower recovery secondary to Parkinson's Leg swelling has persisted  Weight relatively stable at home 250 to 255 Taking Lasix 60 daily Sedentary, legs down Has new sleeper chair, wears compression hose  Persistent atrial fib since 08/2018 noted on pacer download Cardioversion for atrial fib 03/08/2019 seen by Matthew Howe 11/2018 , maintaining normal sinus rhythm on flecainide Flecainide held January 2024 Back in atrial flutter on EKG January 31, 2023  Recovered from hernia surgery January 2021  Echo 03/15/2019 ejection fraction of 50-55%.  PMH:   has a past medical history of Arthritis, Atrial fibrillation (HCC), Atrial fibrillation (HCC), Carpal tunnel syndrome on both sides, CHF (congestive heart failure) (HCC), CKD (chronic kidney disease) (10/2018), Complete heart block (HCC), Complication of anesthesia, Coronary artery disease, GERD (gastroesophageal reflux disease), Heart murmur, History of recent blood transfusion (07/2018), Hyperlipidemia, Hypertension, IBS (irritable bowel syndrome), Lower extremity edema, Parkinson's disease, Presence of permanent cardiac pacemaker, and Shortness of breath dyspnea.  PSH:    Past Surgical History:  Procedure Laterality Date   APPENDECTOMY  12/2010   BACK SURGERY  08/06/2018   lower lumbarsacral region, has rods in place. (done at [facility])   CARDIAC CATHETERIZATION  11/20/2012   Known CAD with one vessel coronary disease of left descending artery by cardiac cath.    CARDIOVERSION N/A 11/21/2016   Procedure: CARDIOVERSION;  Surgeon: Matthew Salvia, MD;  Location: [facility] ORS;  Service: Cardiovascular;  Laterality: N/A;  CARDIOVERSION N/A 03/08/2019   Procedure: CARDIOVERSION;  Surgeon:  Matthew Riffle, MD;  Location: [facility] ENDOSCOPY;  Service: Cardiovascular;  Laterality: N/A;   CARDIOVERSION N/A 02/08/2023   Procedure: CARDIOVERSION;  Surgeon: Matthew Odea, MD;  Location: [facility] ORS;  Service: Cardiovascular;  Laterality: N/A;   CARPAL TUNNEL RELEASE Right 10/22/2018   Procedure: CARPAL TUNNEL RELEASE;  Surgeon: Matthew Heinz, MD;  Location: [facility] ORS;  Service: Orthopedics;  Laterality: Right;   CARPAL TUNNEL RELEASE Left 05/22/2019   Procedure: CARPAL TUNNEL RELEASE - LEFT;  Surgeon: Matthew Heinz, MD;  Location: [facility] ORS;  Service: Orthopedics;  Laterality: Left;   COLONOSCOPY  2018   COLONOSCOPY N/A 10/21/2020   Procedure: COLONOSCOPY;  Surgeon: Matthew Mayotte, MD;  Location: [facility] ENDOSCOPY;  Service: Endoscopy;  Laterality: N/A;   HERNIA REPAIR  10/23/2019   umbilical hernia repair   INSERT / REPLACE / REMOVE PACEMAKER     PACEMAKER INSERTION  11-21-14   [employer]J Assurity dual chamber pacemaker implanted by Dr Matthew Howe for CHB   PERMANENT PACEMAKER INSERTION N/A 11/21/2014   Procedure: PERMANENT PACEMAKER INSERTION;  Surgeon: Matthew Range, MD;  Location: [facility] CATH LAB;  Service: Cardiovascular;  Laterality: N/A;   TEE WITHOUT CARDIOVERSION N/A 11/21/2016   Procedure: TRANSESOPHAGEAL ECHOCARDIOGRAM (TEE);  Surgeon: Matthew Salvia, MD;  Location: [facility] ORS;  Service: Cardiovascular;  Laterality: N/A;   TEE WITHOUT CARDIOVERSION N/A 11/09/2022   Procedure: TRANSESOPHAGEAL ECHOCARDIOGRAM;  Surgeon: Matthew Odea, MD;  Location: [facility] ORS;  Service: Cardiovascular;  Laterality: N/A;   UPPER GI ENDOSCOPY      Current Outpatient Medications  Medication Sig Dispense Refill   acetaminophen (TYLENOL) 500 MG tablet Take 650 mg by mouth in the morning, at noon, and at bedtime.     allopurinol (ZYLOPRIM) 300 MG tablet Take 400 mg by mouth daily. 300 mg tablet + 100 mg tablet once daily  3   amiodarone (PACERONE) 200 MG tablet Take 2 tablets (400 mg total) by mouth 2 (two) times daily  for 14 days, THEN 2 tablets (400 mg total) daily for 14 days, THEN 1 tablet (200 mg total) daily. 110 tablet 3   bisacodyl (DULCOLAX) 5 MG EC tablet Take 5 mg by mouth 2 (two) times daily.     carboxymethylcellulose (REFRESH PLUS) 0.5 % SOLN Place 1 drop into both eyes daily as needed.     carvedilol (COREG) 3.125 MG tablet Take 3.125 mg by mouth 2 (two) times daily.     cholecalciferol (VITAMIN D3) 25 MCG (1000 UNIT) tablet Take 2,000 Units by mouth daily.     Cyanocobalamin (VITAMIN B 12) 500 MCG TABS Take 1,000 mcg by mouth daily.     desloratadine (CLARINEX) 5 MG tablet Take 5 mg by mouth at bedtime.      ELIQUIS 5 MG TABS tablet TAKE 1 TABLET BY MOUTH TWICE A DAY 180 tablet 1   FLUoxetine (PROZAC) 10 MG capsule Take 20 mg by mouth daily.     fluticasone (FLONASE) 50 MCG/ACT nasal spray Place 2 sprays into the nose daily as needed for allergies.      lansoprazole (PREVACID) 15 MG capsule Take 15 mg by mouth daily as needed (indigestion).      magnesium oxide (MAG-OX) 400 MG tablet Take 400 mg by mouth at bedtime.     metolazone (ZAROXOLYN) 2.5 MG tablet TAKE 1 TABLET BY MOUTH DAILY AS NEEDED (ONCE DAILY AS NEEDED FOR WEIGHT GAIN OR SWELLING.). 90 tablet 3   Multiple Vitamins-Minerals (  OCUVITE ADULT 50+ PO) Take 1 tablet by mouth daily.     polyethylene glycol (MIRALAX / GLYCOLAX) packet Take 17 g by mouth daily as needed (constipation.).      potassium chloride (KLOR-CON) 10 MEQ tablet Take 30 mEq by mouth daily.     pramipexole (MIRAPEX) 1 MG tablet Take 1-2 mg by mouth 3 (three) times daily. 2 tablets QAM, 1 tablet at lunch, and 1 tablet at night     pregabalin (LYRICA) 25 MG capsule Take 25 mg by mouth every other day.     Red Yeast Rice 600 MG CAPS Take 600 mg by mouth daily.     sacubitril-valsartan (ENTRESTO) 24-26 MG TAKE 1 TABLET BY MOUTH TWICE A DAY 60 tablet 4   selegiline (ELDEPRYL) 5 MG capsule Take 5 mg by mouth 2 (two) times daily with a meal.      SENEXON-S 8.6-50 MG tablet  Take 2 tablets by mouth 2 (two) times daily.     torsemide (DEMADEX) 20 MG tablet Take 60 mg by mouth daily.     traMADol (ULTRAM) 50 MG tablet Tale 50 milligrams 1-2 daily as needed     Vibegron (GEMTESA) 75 MG TABS Take 1 tablet (75 mg total) by mouth daily. 30 tablet 11   No current facility-administered medications for this visit.    Allergies:   Linaclotide, Codeine, Terazosin, Gabapentin, Hydrocodone-acetaminophen, Lubiprostone, Norco [hydrocodone-acetaminophen], Omeprazole, Oxycodone, and Ropinirole   Social History:  The patient  reports that he has never smoked. He has never been exposed to tobacco smoke. He has never used smokeless tobacco. He reports that he does not drink alcohol and does not use drugs.   Family History:   family history includes Colon cancer in his father; Heart disease in his father and mother; Stroke in his sister.    Review of Systems: Review of Systems  Constitutional: Negative.   HENT: Negative.    Respiratory:  Positive for shortness of breath.   Cardiovascular:  Positive for leg swelling.  Gastrointestinal: Negative.   Musculoskeletal: Negative.        Leg weakness  Neurological: Negative.   Psychiatric/Behavioral: Negative.    All other systems reviewed and are negative.   PHYSICAL EXAM: VS:  BP (!) 108/50 (BP Location: Left Arm, Patient Position: Sitting, Cuff Size: Normal)   Pulse 70   Ht 6' (1.829 m)   Wt 250 lb (113.4 kg)   BMI 33.91 kg/m  , BMI Body mass index is 33.91 kg/m. Constitutional:  oriented to person, place, and time. No distress.  HENT:  Head: Grossly normal Eyes:  no discharge. No scleral icterus.  Neck: No JVD, no carotid bruits  Cardiovascular: Regular rate and rhythm, no murmurs appreciated Pulmonary/Chest: Clear to auscultation bilaterally, no wheezes or rails Abdominal: Soft.  no distension.  no tenderness.  Musculoskeletal: Normal Howe of motion Neurological:  normal muscle tone. Coordination normal. No  atrophy Skin: Skin warm and dry Psychiatric: normal affect, pleasant  Recent Labs: 11/04/2022: ALT 18; B Natriuretic Peptide 1,216.0 02/06/2023: BUN 36; Creatinine, Ser 1.56; Hemoglobin 15.1; Platelets 239; Potassium 4.0; Sodium 141    Lipid Panel Lab Results  Component Value Date   CHOL 188 03/23/2016   HDL 46 03/23/2016   LDLCALC 114 (H) 03/23/2016   TRIG 138 03/23/2016      Wt Readings from Last 3 Encounters:  05/05/23 250 lb (113.4 kg)  04/18/23 250 lb (113.4 kg)  03/20/23 252 lb 4 oz (114.4 kg)  ASSESSMENT AND PLAN:  Essential hypertension -  Blood pressure is well controlled on today's visit. No changes made to the medications.  Paroxysmal atrial fibrillation [facility]) - cardioversion May 2020  off flecainide 100 twice daily  Remains on Coreg 3.125 twice daily, on amiodarone 100 twice daily CV  Atrial flutter cardioversion for atrial for flutter Feb 08, 2023 Now on amiodarone with plans for cardioversion next week, has follow-up with the EP in August  Coronary artery disease involving native coronary artery of native heart without angina pectoris Currently with no symptoms of angina. No further workup at this time. Continue current medication regimen. .  Complete heart block (HCC) Pacemaker in place, followed by Matthew Howe Rate in the 70s, ventricularly paced, wide-complex  Mixed hyperlipidemia Total cholesterol 169 LDL 103 A1c well-controlled 6.1  Constipation Likely related to his Parkinson's disease and dehydration, Stable on senna and Dulcolax  Chronic diastolic and systolic heart failure (HCC) Continue torsemide 60 alternating with 40 mg in the morning Metolazone 2.5 as needed reserved for worsening leg swelling weight gain abdominal distention Continue Entresto, Coreg  Parkinson's disease (HCC) Likely contributing to weakness, fatigue Followed by  Dr. Malvin Johns No regular activity at home or exercise program  Morbid obesity (HCC) Low carbohydrate  recommended, exercise limited  Shortness of breath Exacerbated by flutter, paced rhythm, CHF symptoms, Parkinson's, deconditioning   Total encounter time more than 30 minutes  Greater than 50% was spent in counseling and coordination of care with the patient   Orders Placed This Encounter  Procedures   Confirm CBC and BMP (or CMP) results within 7 days for inpatient and 30 days for outpatient:   EKG 12-Lead      Signed, Dossie Arbour, M.D., Ph.D. 05/05/2023  [facility] [place], [place] [phone]

## 2023-05-04 NOTE — Progress Notes (Signed)
Cardiology Office Note  Date:  05/05/2023   ID:  Matthew Howe, Matthew Howe 06/25/1946, MRN [id number]  PCP:  Mick Sell, MD   Cc: Shortness of breath, fatigue   HPI:  Mr. Flythe is a 77 year old gentleman with diagnosis of  Parkinson's who currently works delivering car parts,  Hypertension,  cardiac catheterization showing mild CAD , February 2014  EF 55% hyperlipidemia,  s/p  pacemaker placement.complete heart block.  Found to have atrial fibrillation 11/22/2014 on pacemaker download, started on anticoagulation    hospitalization for paroxysmal atrial fibrillation/acute on chronic diastolic CHF Echocardiogram January 2024 and TEE 1/24 EF 40 to 45% cardioversion for atrial for flutter Feb 08, 2023 He presents today for weight gain, shortness of breath, leg edema, chronic diastolic CHF,Paroxysmal atrial fibrillation  Last seen by myself in clinic 6/24 Recently seen by Dr. Graciela Husbands started on amiodarone load 400 twice daily with plans for cardioversion next week Reports leg swelling has improved over the past month Currently alternating  torsemide 60/40 every other day Has not had to take any more metolazone Uses compression hose Reports energy is poor Has good days bad days but in general not able to do very much Presents today in a wheelchair  EKG personally reviewed by myself on todays visit EKG Interpretation Date/Time:  Friday May 05 2023 11:00:55 EDT Ventricular Rate:  70 PR Interval:    QRS Duration:  196 QT Interval:  554 QTC Calculation: 598 R Axis:   -82  Text Interpretation: Ventricular-paced rhythm When compared with ECG of 18-Apr-2023 10:54, No significant change was found Confirmed by Julien Nordmann [phone]) on 05/05/2023 11:14:45 AM    Other past medical history reviewed Hospitalized January 2024 sepsis, cellulitis, strep pneumo bacteremia Ejection fraction 40% Started on Entresto, Coreg, Lasix 60 daily, metolazone as needed Flecainide held in the setting  of cardiomyopathy Continue on carvedilol, Eliquis Transesophageal echo completed, lead extraction not performed given comorbidities Treated with IV antibiotics through PICC line  Completed 2 weeks ABX Has had follow-up in CHF clinic Working with PT, slow recovery, slower recovery secondary to Parkinson's Leg swelling has persisted  Weight relatively stable at home 250 to 255 Taking Lasix 60 daily Sedentary, legs down Has new sleeper chair, wears compression hose  Persistent atrial fib since 08/2018 noted on pacer download Cardioversion for atrial fib 03/08/2019 seen by Dr. Graciela Husbands 11/2018 , maintaining normal sinus rhythm on flecainide Flecainide held January 2024 Back in atrial flutter on EKG January 31, 2023  Recovered from hernia surgery January 2021  Echo 03/15/2019 ejection fraction of 50-55%.  PMH:   has a past medical history of Arthritis, Atrial fibrillation (HCC), Atrial fibrillation (HCC), Carpal tunnel syndrome on both sides, CHF (congestive heart failure) (HCC), CKD (chronic kidney disease) (10/2018), Complete heart block (HCC), Complication of anesthesia, Coronary artery disease, GERD (gastroesophageal reflux disease), Heart murmur, History of recent blood transfusion (07/2018), Hyperlipidemia, Hypertension, IBS (irritable bowel syndrome), Lower extremity edema, Parkinson's disease, Presence of permanent cardiac pacemaker, and Shortness of breath dyspnea.  PSH:    Past Surgical History:  Procedure Laterality Date   APPENDECTOMY  12/2010   BACK SURGERY  08/06/2018   lower lumbarsacral region, has rods in place. (done at [facility])   CARDIAC CATHETERIZATION  11/20/2012   Known CAD with one vessel coronary disease of left descending artery by cardiac cath.    CARDIOVERSION N/A 11/21/2016   Procedure: CARDIOVERSION;  Surgeon: Duke Salvia, MD;  Location: [facility] ORS;  Service: Cardiovascular;  Laterality: N/A;  CARDIOVERSION N/A 03/08/2019   Procedure: CARDIOVERSION;  Surgeon:  Pricilla Riffle, MD;  Location: [facility] ENDOSCOPY;  Service: Cardiovascular;  Laterality: N/A;   CARDIOVERSION N/A 02/08/2023   Procedure: CARDIOVERSION;  Surgeon: Debbe Odea, MD;  Location: [facility] ORS;  Service: Cardiovascular;  Laterality: N/A;   CARPAL TUNNEL RELEASE Right 10/22/2018   Procedure: CARPAL TUNNEL RELEASE;  Surgeon: Donato Heinz, MD;  Location: [facility] ORS;  Service: Orthopedics;  Laterality: Right;   CARPAL TUNNEL RELEASE Left 05/22/2019   Procedure: CARPAL TUNNEL RELEASE - LEFT;  Surgeon: Donato Heinz, MD;  Location: [facility] ORS;  Service: Orthopedics;  Laterality: Left;   COLONOSCOPY  2018   COLONOSCOPY N/A 10/21/2020   Procedure: COLONOSCOPY;  Surgeon: Earline Mayotte, MD;  Location: [facility] ENDOSCOPY;  Service: Endoscopy;  Laterality: N/A;   HERNIA REPAIR  10/23/2019   umbilical hernia repair   INSERT / REPLACE / REMOVE PACEMAKER     PACEMAKER INSERTION  11-21-14   STJ Assurity dual chamber pacemaker implanted by Dr Johney Frame for CHB   PERMANENT PACEMAKER INSERTION N/A 11/21/2014   Procedure: PERMANENT PACEMAKER INSERTION;  Surgeon: Hillis Range, MD;  Location[facility] CATH LAB;  Service: Cardiovascular;  Laterality: N/A;   TEE WITHOUT CARDIOVERSION N/A 11/21/2016   Procedure: TRANSESOPHAGEAL ECHOCARDIOGRAM (TEE);  Surgeon: Duke Salvia, MD;  Location: [facility] ORS;  Service: Cardiovascular;  Laterality: N/A;   TEE WITHOUT CARDIOVERSION N/A 11/09/2022   Procedure: TRANSESOPHAGEAL ECHOCARDIOGRAM;  Surgeon: Debbe Odea, MD;  Location: [facility] ORS;  Service: Cardiovascular;  Laterality: N/A;   UPPER GI ENDOSCOPY      Current Outpatient Medications  Medication Sig Dispense Refill   acetaminophen (TYLENOL) 500 MG tablet Take 650 mg by mouth in the morning, at noon, and at bedtime.     allopurinol (ZYLOPRIM) 300 MG tablet Take 400 mg by mouth daily. 300 mg tablet + 100 mg tablet once daily  3   amiodarone (PACERONE) 200 MG tablet Take 2 tablets (400 mg total) by mouth 2 (two) times daily  for 14 days, THEN 2 tablets (400 mg total) daily for 14 days, THEN 1 tablet (200 mg total) daily. 110 tablet 3   bisacodyl (DULCOLAX) 5 MG EC tablet Take 5 mg by mouth 2 (two) times daily.     carboxymethylcellulose (REFRESH PLUS) 0.5 % SOLN Place 1 drop into both eyes daily as needed.     carvedilol (COREG) 3.125 MG tablet Take 3.125 mg by mouth 2 (two) times daily.     cholecalciferol (VITAMIN D3) 25 MCG (1000 UNIT) tablet Take 2,000 Units by mouth daily.     Cyanocobalamin (VITAMIN B 12) 500 MCG TABS Take 1,000 mcg by mouth daily.     desloratadine (CLARINEX) 5 MG tablet Take 5 mg by mouth at bedtime.      ELIQUIS 5 MG TABS tablet TAKE 1 TABLET BY MOUTH TWICE A DAY 180 tablet 1   FLUoxetine (PROZAC) 10 MG capsule Take 20 mg by mouth daily.     fluticasone (FLONASE) 50 MCG/ACT nasal spray Place 2 sprays into the nose daily as needed for allergies.      lansoprazole (PREVACID) 15 MG capsule Take 15 mg by mouth daily as needed (indigestion).      magnesium oxide (MAG-OX) 400 MG tablet Take 400 mg by mouth at bedtime.     metolazone (ZAROXOLYN) 2.5 MG tablet TAKE 1 TABLET BY MOUTH DAILY AS NEEDED (ONCE DAILY AS NEEDED FOR WEIGHT GAIN OR SWELLING.). 90 tablet 3   Multiple Vitamins-Minerals (  OCUVITE ADULT 50+ PO) Take 1 tablet by mouth daily.     polyethylene glycol (MIRALAX / GLYCOLAX) packet Take 17 g by mouth daily as needed (constipation.).      potassium chloride (KLOR-CON) 10 MEQ tablet Take 30 mEq by mouth daily.     pramipexole (MIRAPEX) 1 MG tablet Take 1-2 mg by mouth 3 (three) times daily. 2 tablets QAM, 1 tablet at lunch, and 1 tablet at night     pregabalin (LYRICA) 25 MG capsule Take 25 mg by mouth every other day.     Red Yeast Rice 600 MG CAPS Take 600 mg by mouth daily.     sacubitril-valsartan (ENTRESTO) 24-26 MG TAKE 1 TABLET BY MOUTH TWICE A DAY 60 tablet 4   selegiline (ELDEPRYL) 5 MG capsule Take 5 mg by mouth 2 (two) times daily with a meal.      SENEXON-S 8.6-50 MG tablet  Take 2 tablets by mouth 2 (two) times daily.     torsemide (DEMADEX) 20 MG tablet Take 60 mg by mouth daily.     traMADol (ULTRAM) 50 MG tablet Tale 50 milligrams 1-2 daily as needed     Vibegron (GEMTESA) 75 MG TABS Take 1 tablet (75 mg total) by mouth daily. 30 tablet 11   No current facility-administered medications for this visit.    Allergies:   Linaclotide, Codeine, Terazosin, Gabapentin, Hydrocodone-acetaminophen, Lubiprostone, Norco [hydrocodone-acetaminophen], Omeprazole, Oxycodone, and Ropinirole   Social History:  The patient  reports that he has never smoked. He has never been exposed to tobacco smoke. He has never used smokeless tobacco. He reports that he does not drink alcohol and does not use drugs.   Family History:   family history includes Colon cancer in his father; Heart disease in his father and mother; Stroke in his sister.    Review of Systems: Review of Systems  Constitutional: Negative.   HENT: Negative.    Respiratory:  Positive for shortness of breath.   Cardiovascular:  Positive for leg swelling.  Gastrointestinal: Negative.   Musculoskeletal: Negative.        Leg weakness  Neurological: Negative.   Psychiatric/Behavioral: Negative.    All other systems reviewed and are negative.   PHYSICAL EXAM: VS:  BP (!) 108/50 (BP Location: Left Arm, Patient Position: Sitting, Cuff Size: Normal)   Pulse 70   Ht 6' (1.829 m)   Wt 250 lb (113.4 kg)   BMI 33.91 kg/m  , BMI Body mass index is 33.91 kg/m. Constitutional:  oriented to person, place, and time. No distress.  HENT:  Head: Grossly normal Eyes:  no discharge. No scleral icterus.  Neck: No JVD, no carotid bruits  Cardiovascular: Regular rate and rhythm, no murmurs appreciated Pulmonary/Chest: Clear to auscultation bilaterally, no wheezes or rails Abdominal: Soft.  no distension.  no tenderness.  Musculoskeletal: Normal range of motion Neurological:  normal muscle tone. Coordination normal. No  atrophy Skin: Skin warm and dry Psychiatric: normal affect, pleasant  Recent Labs: 11/04/2022: ALT 18; B Natriuretic Peptide 1,216.0 02/06/2023: BUN 36; Creatinine, Ser 1.56; Hemoglobin 15.1; Platelets 239; Potassium 4.0; Sodium 141    Lipid Panel Lab Results  Component Value Date   CHOL 188 03/23/2016   HDL 46 03/23/2016   LDLCALC 114 (H) 03/23/2016   TRIG 138 03/23/2016      Wt Readings from Last 3 Encounters:  05/05/23 250 lb (113.4 kg)  04/18/23 250 lb (113.4 kg)  03/20/23 252 lb 4 oz (114.4 kg)  ASSESSMENT AND PLAN:  Essential hypertension -  Blood pressure is well controlled on today's visit. No changes made to the medications.  Paroxysmal atrial fibrillation [facility]) - cardioversion May 2020  off flecainide 100 twice daily  Remains on Coreg 3.125 twice daily, on amiodarone 100 twice daily CV  Atrial flutter cardioversion for atrial for flutter Feb 08, 2023 Now on amiodarone with plans for cardioversion next week, has follow-up with the EP in August  Coronary artery disease involving native coronary artery of native heart without angina pectoris Currently with no symptoms of angina. No further workup at this time. Continue current medication regimen. .  Complete heart block (HCC) Pacemaker in place, followed by Dr. Graciela Husbands Rate in the 70s, ventricularly paced, wide-complex  Mixed hyperlipidemia Total cholesterol 169 LDL 103 A1c well-controlled 6.1  Constipation Likely related to his Parkinson's disease and dehydration, Stable on senna and Dulcolax  Chronic diastolic and systolic heart failure (HCC) Continue torsemide 60 alternating with 40 mg in the morning Metolazone 2.5 as needed reserved for worsening leg swelling weight gain abdominal distention Continue Entresto, Coreg  Parkinson's disease (HCC) Likely contributing to weakness, fatigue Followed by  Dr. Malvin Johns No regular activity at home or exercise program  Morbid obesity (HCC) Low carbohydrate  recommended, exercise limited  Shortness of breath Exacerbated by flutter, paced rhythm, CHF symptoms, Parkinson's, deconditioning   Total encounter time more than 30 minutes  Greater than 50% was spent in counseling and coordination of care with the patient   Orders Placed This Encounter  Procedures   Confirm CBC and BMP (or CMP) results within 7 days for inpatient and 30 days for outpatient:   EKG 12-Lead      Signed, Dossie Arbour, M.D., Ph.D. 05/05/2023  [facility] [place], [place] [phone]

## 2023-05-05 ENCOUNTER — Encounter: Payer: Self-pay | Admitting: Cardiovascular Disease

## 2023-05-05 ENCOUNTER — Ambulatory Visit: Payer: Medicare Other | Attending: Cardiovascular Disease | Admitting: Cardiovascular Disease

## 2023-05-05 VITALS — BP 108/50 | HR 70 | Ht 72.0 in | Wt 250.0 lb

## 2023-05-05 DIAGNOSIS — I5022 Chronic systolic (congestive) heart failure: Secondary | ICD-10-CM | POA: Diagnosis present

## 2023-05-05 DIAGNOSIS — I4892 Unspecified atrial flutter: Secondary | ICD-10-CM | POA: Diagnosis present

## 2023-05-05 DIAGNOSIS — I442 Atrioventricular block, complete: Secondary | ICD-10-CM | POA: Insufficient documentation

## 2023-05-05 DIAGNOSIS — I4819 Other persistent atrial fibrillation: Secondary | ICD-10-CM | POA: Diagnosis present

## 2023-05-05 DIAGNOSIS — G20A1 Parkinson's disease without dyskinesia, without mention of fluctuations: Secondary | ICD-10-CM | POA: Insufficient documentation

## 2023-05-05 DIAGNOSIS — I25118 Atherosclerotic heart disease of native coronary artery with other forms of angina pectoris: Secondary | ICD-10-CM | POA: Diagnosis present

## 2023-05-05 DIAGNOSIS — Z950 Presence of cardiac pacemaker: Secondary | ICD-10-CM | POA: Diagnosis present

## 2023-05-05 DIAGNOSIS — I77810 Thoracic aortic ectasia: Secondary | ICD-10-CM | POA: Diagnosis present

## 2023-05-05 DIAGNOSIS — I10 Essential (primary) hypertension: Secondary | ICD-10-CM | POA: Insufficient documentation

## 2023-05-05 NOTE — Patient Instructions (Addendum)
Medication Instructions:  Hold Entresto and Lasix the day of your cardioversion.   If you need a refill on your cardiac medications before your next appointment, please call your pharmacy.   Lab work: No new labs needed  Testing/Procedures: No new testing needed  Follow-Up: At [facility], you and your health needs are our priority.  As part of our continuing mission to provide you with exceptional heart care, we have created designated Provider Care Teams.  These Care Teams include your primary Cardiologist (physician) and Advanced Practice Providers (APPs -  Physician Assistants and Nurse Practitioners) who all work together to provide you with the care you need, when you need it.  You will need a follow up appointment in 12 months  Providers on your designated Care Team:   Nicolasa Ducking, NP Eula Listen, PA-C Cadence Fransico Michael, [place]  COVID-19 Vaccine Information can be found at: [email or web address] For questions related to vaccine distribution or appointments, please email [email or web address] or call [phone].

## 2023-05-09 ENCOUNTER — Ambulatory Visit: Payer: Medicare Other | Admitting: Cardiovascular Disease

## 2023-05-09 ENCOUNTER — Encounter: Payer: Self-pay | Admitting: Internal Medicine

## 2023-05-09 MED ORDER — SODIUM CHLORIDE 0.9 % IV SOLN: INTRAVENOUS | Status: DC

## 2023-05-10 ENCOUNTER — Other Ambulatory Visit: Payer: Self-pay

## 2023-05-10 ENCOUNTER — Ambulatory Visit
Admission: RE | Admit: 2023-05-10 | Discharge: 2023-05-10 | Disposition: A | Discharge status: Home / Self Care | Payer: Medicare Other | Attending: Cardiovascular Disease | Admitting: Cardiovascular Disease

## 2023-05-10 ENCOUNTER — Encounter
Admission: RE | Disposition: A | Discharge status: Home / Self Care | Payer: Self-pay | Source: Home / Self Care | Attending: Cardiovascular Disease

## 2023-05-10 ENCOUNTER — Ambulatory Visit: Payer: Medicare Other | Admitting: Anesthesiology

## 2023-05-10 ENCOUNTER — Encounter: Payer: Self-pay | Admitting: Cardiovascular Disease

## 2023-05-10 DIAGNOSIS — Z6833 Body mass index (BMI) 33.0-33.9, adult: Secondary | ICD-10-CM | POA: Insufficient documentation

## 2023-05-10 DIAGNOSIS — I442 Atrioventricular block, complete: Secondary | ICD-10-CM | POA: Insufficient documentation

## 2023-05-10 DIAGNOSIS — N189 Chronic kidney disease, unspecified: Secondary | ICD-10-CM | POA: Insufficient documentation

## 2023-05-10 DIAGNOSIS — R0602 Shortness of breath: Secondary | ICD-10-CM | POA: Insufficient documentation

## 2023-05-10 DIAGNOSIS — K219 Gastro-esophageal reflux disease without esophagitis: Secondary | ICD-10-CM | POA: Insufficient documentation

## 2023-05-10 DIAGNOSIS — Z950 Presence of cardiac pacemaker: Secondary | ICD-10-CM | POA: Insufficient documentation

## 2023-05-10 DIAGNOSIS — I480 Paroxysmal atrial fibrillation: Secondary | ICD-10-CM | POA: Diagnosis not present

## 2023-05-10 DIAGNOSIS — E782 Mixed hyperlipidemia: Secondary | ICD-10-CM | POA: Insufficient documentation

## 2023-05-10 DIAGNOSIS — I2510 Atherosclerotic heart disease of native coronary artery without angina pectoris: Secondary | ICD-10-CM | POA: Diagnosis not present

## 2023-05-10 DIAGNOSIS — R5383 Other fatigue: Secondary | ICD-10-CM | POA: Insufficient documentation

## 2023-05-10 DIAGNOSIS — K589 Irritable bowel syndrome without diarrhea: Secondary | ICD-10-CM | POA: Insufficient documentation

## 2023-05-10 DIAGNOSIS — I483 Typical atrial flutter: Secondary | ICD-10-CM | POA: Diagnosis present

## 2023-05-10 DIAGNOSIS — G20A1 Parkinson's disease without dyskinesia, without mention of fluctuations: Secondary | ICD-10-CM | POA: Diagnosis not present

## 2023-05-10 DIAGNOSIS — I130 Hypertensive heart and chronic kidney disease with heart failure and stage 1 through stage 4 chronic kidney disease, or unspecified chronic kidney disease: Secondary | ICD-10-CM | POA: Insufficient documentation

## 2023-05-10 DIAGNOSIS — I5042 Chronic combined systolic (congestive) and diastolic (congestive) heart failure: Secondary | ICD-10-CM | POA: Insufficient documentation

## 2023-05-10 DIAGNOSIS — I4819 Other persistent atrial fibrillation: Secondary | ICD-10-CM

## 2023-05-10 DIAGNOSIS — Z7901 Long term (current) use of anticoagulants: Secondary | ICD-10-CM | POA: Diagnosis not present

## 2023-05-10 HISTORY — PX: CARDIOVERSION: SHX1299

## 2023-05-10 SURGERY — CARDIOVERSION
Anesthesia: General

## 2023-05-10 MED ORDER — EPHEDRINE 5 MG/ML INJ
INTRAVENOUS | Status: AC
  Filled 2023-05-10: qty 5

## 2023-05-10 MED ORDER — ATROPINE SULFATE 1 MG/10ML IJ SOSY
PREFILLED_SYRINGE | INTRAMUSCULAR | Status: AC
  Filled 2023-05-10: qty 10

## 2023-05-10 MED ORDER — PHENYLEPHRINE 80 MCG/ML (10ML) SYRINGE FOR IV PUSH (FOR BLOOD PRESSURE SUPPORT)
PREFILLED_SYRINGE | INTRAVENOUS | Status: AC
  Filled 2023-05-10: qty 10

## 2023-05-10 MED ORDER — EPINEPHRINE 1 MG/10ML IJ SOSY
PREFILLED_SYRINGE | INTRAMUSCULAR | Status: AC
  Filled 2023-05-10: qty 10

## 2023-05-10 MED ORDER — SUCCINYLCHOLINE CHLORIDE 200 MG/10ML IV SOSY
PREFILLED_SYRINGE | INTRAVENOUS | Status: AC
  Filled 2023-05-10: qty 10

## 2023-05-10 MED ORDER — PROPOFOL 1000 MG/100ML IV EMUL
INTRAVENOUS | Status: AC
  Filled 2023-05-10: qty 100

## 2023-05-10 MED ORDER — PROPOFOL 10 MG/ML IV BOLUS
INTRAVENOUS | Status: DC | PRN
  Administered 2023-05-10: 60 mg via INTRAVENOUS

## 2023-05-10 NOTE — CV Procedure (Signed)
Cardioversion procedure note For atrial flutter, typical  Procedure Details:  Consent: Risks of procedure as well as the alternatives and risks of each were explained to the (patient/caregiver).  Consent for procedure obtained.  Time Out: Verified patient identification, verified procedure, site/side was marked, verified correct patient position, special equipment/implants available, medications/allergies/relevent history reviewed, required imaging and test results available.  Performed  Patient placed on cardiac monitor, pulse oximetry, supplemental oxygen as necessary.   Sedation given: propofol IV, Dr. Pernell Dupre Pacer pads placed anterior and posterior chest.   Cardioverted 1 time(s).   Cardioverted at  150 J. Synchronized biphasic Converted to NSR   Evaluation: Findings: Post procedure EKG shows: NSR, A-sense V paced Complications: None Patient did tolerate procedure well.  Time Spent Directly with the Patient:  45 minutes   Dossie Arbour, M.D., Ph.D.

## 2023-05-10 NOTE — H&P (Signed)
H&P Addendum, pre-cardioversion ° °Patient was seen and evaluated prior to -cardioversion procedure °Symptoms, prior testing details again confirmed with the patient °Patient examined, no significant change from prior exam °Lab work reviewed in detail personally by myself °Patient understands risk and benefit of the procedure,  °The risks (stroke, cardiac arrhythmias rarely resulting in the need for a temporary or permanent pacemaker, skin irritation or burns and complications associated with conscious sedation including aspiration, arrhythmia, respiratory failure and death), benefits (restoration of normal sinus rhythm) and alternatives of a direct current cardioversion were explained in detail °Patient willing to proceed. ° °Signed, °Tim Gollan, MD, Ph.D °[facility]  °

## 2023-05-10 NOTE — Anesthesia Preprocedure Evaluation (Signed)
Anesthesia Evaluation  Patient identified by MRN, date of birth, ID band Patient awake    Reviewed: Allergy & Precautions, H&P , NPO status , Patient's Chart, lab work & pertinent test results, reviewed documented beta blocker date and time   History of Anesthesia Complications (+) PROLONGED EMERGENCE and history of anesthetic complications  Airway Mallampati: II   Neck ROM: full    Dental  (+) Poor Dentition   Pulmonary shortness of breath and with exertion   Pulmonary exam normal        Cardiovascular Exercise Tolerance: Poor hypertension, On Medications + CAD and +CHF  + dysrhythmias Atrial Fibrillation + pacemaker + Valvular Problems/Murmurs  Rhythm:regular Rate:Normal     Neuro/Psych  Neuromuscular disease  negative psych ROS   GI/Hepatic Neg liver ROS,GERD  Medicated,,  Endo/Other  negative endocrine ROS    Renal/GU Renal disease  negative genitourinary   Musculoskeletal   Abdominal   Peds  Hematology negative hematology ROS (+)   Anesthesia Other Findings Past Medical History: No date: Arthritis No date: Atrial fibrillation (HCC) No date: Atrial fibrillation (HCC) No date: Carpal tunnel syndrome on both sides No date: CHF (congestive heart failure) (HCC) 10/2018: CKD (chronic kidney disease)     Comment:  recent diagnosis No date: Complete heart block (HCC)     Comment:  a. s/p St. Jude PPM 11/2014. No date: Complication of anesthesia     Comment:  stayed overnight dt under too long d/t taking               Parkinson's medication (2013?) No date: Coronary artery disease No date: GERD (gastroesophageal reflux disease)     Comment:  "not as bad as it used to be" No date: Heart murmur 07/2018: History of recent blood transfusion No date: Hyperlipidemia No date: Hypertension No date: IBS (irritable bowel syndrome) No date: Lower extremity edema No date: Parkinson's disease No date: Presence of  permanent cardiac pacemaker     Comment:  St Jude No date: Shortness of breath dyspnea Past Surgical History: 12/2010: APPENDECTOMY 08/06/2018: BACK SURGERY     Comment:  lower lumbarsacral region, has rods in place. (done at               [facility]) 11/20/2012: CARDIAC CATHETERIZATION     Comment:  Known CAD with one vessel coronary disease of left               descending artery by cardiac cath.  11/21/2016: CARDIOVERSION; N/A     Comment:  Procedure: CARDIOVERSION;  Surgeon: Duke Salvia, MD;               Location: [facility] ORS;  Service: Cardiovascular;                Laterality: N/A; 03/08/2019: CARDIOVERSION; N/A     Comment:  Procedure: CARDIOVERSION;  Surgeon: Pricilla Riffle, MD;                Location: [facility] ENDOSCOPY;  Service: Cardiovascular;                Laterality: N/A; 02/08/2023: CARDIOVERSION; N/A     Comment:  Procedure: CARDIOVERSION;  Surgeon: Debbe Odea,               MD;  Location: [facility] ORS;  Service: Cardiovascular;                Laterality: N/A; 10/22/2018: CARPAL TUNNEL RELEASE; Right     Comment:  Procedure:  CARPAL TUNNEL RELEASE;  Surgeon: Donato Heinz, MD;  Location: [facility] ORS;  Service: Orthopedics;               Laterality: Right; 05/22/2019: CARPAL TUNNEL RELEASE; Left     Comment:  Procedure: CARPAL TUNNEL RELEASE - LEFT;  Surgeon:               Donato Heinz, MD;  Location: [facility] ORS;  Service:               Orthopedics;  Laterality: Left; 2018: COLONOSCOPY 10/21/2020: COLONOSCOPY; N/A     Comment:  Procedure: COLONOSCOPY;  Surgeon: Earline Mayotte,               MD;  Location: [facility] ENDOSCOPY;  Service: Endoscopy;                Laterality: N/A; 10/23/2019: HERNIA REPAIR     Comment:  umbilical hernia repair No date: INSERT / REPLACE / REMOVE PACEMAKER 11-21-14: PACEMAKER INSERTION     Comment:  STJ Assurity dual chamber pacemaker implanted by Dr               Johney Frame for CHB 11/21/2014: PERMANENT PACEMAKER INSERTION; N/A      Comment:  Procedure: PERMANENT PACEMAKER INSERTION;  Surgeon:               Hillis Range, MD;  Location: [facility] CATH LAB;  Service:               Cardiovascular;  Laterality: N/A; 11/21/2016: TEE WITHOUT CARDIOVERSION; N/A     Comment:  Procedure: TRANSESOPHAGEAL ECHOCARDIOGRAM (TEE);                Surgeon: Duke Salvia, MD;  Location: [facility] ORS;                Service: Cardiovascular;  Laterality: N/A; 11/09/2022: TEE WITHOUT CARDIOVERSION; N/A     Comment:  Procedure: TRANSESOPHAGEAL ECHOCARDIOGRAM;  Surgeon:               Debbe Odea, MD;  Location: [facility] ORS;  Service:               Cardiovascular;  Laterality: N/A; No date: UPPER GI ENDOSCOPY BMI    Body Mass Index: 33.23 kg/m     Reproductive/Obstetrics negative OB ROS                             Anesthesia Physical Anesthesia Plan  ASA: 4  Anesthesia Plan: General   Post-op Pain Management:    Induction:   PONV Risk Score and Plan: 2  Airway Management Planned:   Additional Equipment:   Intra-op Plan:   Post-operative Plan:   Informed Consent: I have reviewed the patients History and Physical, chart, labs and discussed the procedure including the risks, benefits and alternatives for the proposed anesthesia with the patient or authorized representative who has indicated his/her understanding and acceptance.     Dental Advisory Given  Plan Discussed with: CRNA  Anesthesia Plan Comments:        Anesthesia Quick Evaluation

## 2023-05-10 NOTE — Transfer of Care (Signed)
Immediate Anesthesia Transfer of Care Note  Patient: Matthew Howe  Procedure(s) Performed: CARDIOVERSION  Patient Location:  Special Procedures  Anesthesia Type:General  Level of Consciousness: drowsy  Airway & Oxygen Therapy: Patient Spontanous Breathing and Patient connected to nasal cannula oxygen  Post-op Assessment: Report given to RN and Post -op Vital signs reviewed and stable  Post vital signs: Reviewed and stable  Last Vitals:  Vitals Value Taken Time  BP 115/67 05/10/23 0739  Temp    Pulse 60 05/10/23 0744  Resp 10 05/10/23 0744  SpO2 97 % 05/10/23 0744  Vitals shown include unfiled device data.  Last Pain:  Vitals:   05/10/23 0656  TempSrc: Oral  PainSc: 5       Patients Stated Pain Goal: 5 (05/10/23 0656)  Complications: No notable events documented.

## 2023-05-10 NOTE — Interval H&P Note (Signed)
History and Physical Interval Note:  05/10/2023 10:18 AM  Linde Gillis  has presented today for surgery, with the diagnosis of Cardioversion   Afib.  The various methods of treatment have been discussed with the patient and family. After consideration of risks, benefits and other options for treatment, the patient has consented to  Procedure(s): CARDIOVERSION (N/A) as a surgical intervention.  The patient's history has been reviewed, patient examined, no change in status, stable for surgery.  I have reviewed the patient's chart and labs.  Questions were answered to the patient's satisfaction.     Julien Nordmann

## 2023-05-11 NOTE — Anesthesia Postprocedure Evaluation (Signed)
Anesthesia Post Note  Patient: Matthew Howe  Procedure(s) Performed: CARDIOVERSION  Patient location during evaluation: PACU Anesthesia Type: General Level of consciousness: awake and alert Pain management: pain level controlled Vital Signs Assessment: post-procedure vital signs reviewed and stable Respiratory status: spontaneous breathing, nonlabored ventilation, respiratory function stable and patient connected to nasal cannula oxygen Cardiovascular status: blood pressure returned to baseline and stable Postop Assessment: no apparent nausea or vomiting Anesthetic complications: no   No notable events documented.   Last Vitals:  Vitals:   05/10/23 0815 05/10/23 0830  BP: 108/60 123/63  Pulse: (!) 59 (!) 59  Resp: 20 16  Temp:    SpO2: 95% 96%    Last Pain:  Vitals:   05/10/23 0830  TempSrc:   PainSc: 0-No pain                 Yevette Edwards

## 2023-05-19 NOTE — Progress Notes (Signed)
Remote pacemaker transmission.   

## 2023-05-22 ENCOUNTER — Other Ambulatory Visit: Payer: Self-pay | Admitting: Emergency Medicine

## 2023-05-30 ENCOUNTER — Ambulatory Visit: Payer: Medicare Other | Attending: Internal Medicine | Admitting: Internal Medicine

## 2023-05-30 ENCOUNTER — Encounter: Payer: Self-pay | Admitting: Internal Medicine

## 2023-05-30 VITALS — BP 110/58 | HR 60 | Ht 72.0 in | Wt 249.0 lb

## 2023-05-30 DIAGNOSIS — I4892 Unspecified atrial flutter: Secondary | ICD-10-CM

## 2023-05-30 DIAGNOSIS — I5022 Chronic systolic (congestive) heart failure: Secondary | ICD-10-CM | POA: Diagnosis not present

## 2023-05-30 DIAGNOSIS — I442 Atrioventricular block, complete: Secondary | ICD-10-CM

## 2023-05-30 DIAGNOSIS — I25118 Atherosclerotic heart disease of native coronary artery with other forms of angina pectoris: Secondary | ICD-10-CM | POA: Diagnosis present

## 2023-05-30 DIAGNOSIS — I4819 Other persistent atrial fibrillation: Secondary | ICD-10-CM

## 2023-05-30 DIAGNOSIS — Z950 Presence of cardiac pacemaker: Secondary | ICD-10-CM

## 2023-05-30 MED ORDER — SPIRONOLACTONE 25 MG PO TABS: 12.5000 mg | ORAL_TABLET | Freq: Every evening | ORAL | 3 refills | Status: DC

## 2023-05-30 NOTE — Progress Notes (Signed)
Patient Care Team: Mick Sell, MD as PCP - General (Infectious Diseases) Antonieta Iba, MD as PCP - Cardiology (Cardiology) Duke Salvia, MD as PCP - Electrophysiology (Cardiology) Delma Freeze, FNP as Nurse Practitioner (Cardiology) Antonieta Iba, MD as Consulting Physician (Cardiology) Morene Crocker, MD as Referring Physician (Neurology)   HPI  Matthew Howe is a 77 y.o. male Seen following pacemaker implantation [employer] 2/16 by Dr. Fawn Kirk For complete heart block.  History of atrial fibrillation on flecainide with anticoagulation with apixaban;  flecainide stopped 2/24 in the setting of an admission for sepsis and respiratory failure.  He underwent TEE because of streptococcal pneumoniae bacteremia.? 2/2  cellulitis.  Concern for vegetation on the RA lead but a fibrionous strand with low specificity for vegetation.  Outpatient antibiotics were recommended, repeat cultures were negative.  Extraction was not pursued because of comorbidities  anticoagulation with Apixaban   DC cardioversion 5/24 successful unfortunately held for only about 3 days; it was the patient's impression as well as his wife's that he felt better.  Elected to begin amiodarone and repeat cardioversion 7/24  Overall has been feeling considerably better following cardioversion.  More energy.  Walking better less obvious shortness of breath.  No edema.  Clearer of thinking with fewer auditory and visual hallucinations.  Patient denies symptoms of respiratory, GI intolerance, sun sensitivity, neurological symptoms attributable to amiodarone.  ***      . DATE TEST EF   2/14 LHC  Non obstructive CAD  10/16 echo   55-60 %   3/18 Echo   55-60 %   6/20 Echo  55-60  Asc Ao 3.9 LVH mild LAE (-/53/38)  1/24 Echo  40-45%       Date Cr K Hgb LFT TSH  12/20 1.53 4.4 13.8    6/21 1.6 4.4 14.6    6/22 1.7 4.6 14.2    3/24 1.63 4.0 14.6    6/24 1.4<<2.2 4.1  14           Past Medical  History:  Diagnosis Date   Arthritis    Atrial fibrillation [facility])    Atrial fibrillation (HCC)    Carpal tunnel syndrome on both sides    CHF (congestive heart failure) (HCC)    CKD (chronic kidney disease) 10/2018   recent diagnosis   Complete heart block (HCC)    a. s/p St. Jude PPM 11/2014.   Complication of anesthesia    stayed overnight dt under too long d/t taking Parkinson's medication (2013?)   Coronary artery disease    GERD (gastroesophageal reflux disease)    "not as bad as it used to be"   Heart murmur    History of recent blood transfusion 07/2018   Hyperlipidemia    Hypertension    IBS (irritable bowel syndrome)    Lower extremity edema    Parkinson's disease    Presence of permanent cardiac pacemaker    St Jude   Shortness of breath dyspnea     Past Surgical History:  Procedure Laterality Date   APPENDECTOMY  12/2010   BACK SURGERY  08/06/2018   lower lumbarsacral region, has rods in place. (done at [facility])   CARDIAC CATHETERIZATION  11/20/2012   Known CAD with one vessel coronary disease of left descending artery by cardiac cath.    CARDIOVERSION N/A 11/21/2016   Procedure: CARDIOVERSION;  Surgeon: Duke Salvia, MD;  Location: [facility] ORS;  Service: Cardiovascular;  Laterality:  N/A;   CARDIOVERSION N/A 03/08/2019   Procedure: CARDIOVERSION;  Surgeon: Pricilla Riffle, MD;  Location: [facility] ENDOSCOPY;  Service: Cardiovascular;  Laterality: N/A;   CARDIOVERSION N/A 02/08/2023   Procedure: CARDIOVERSION;  Surgeon: Debbe Odea, MD;  Location: [facility] ORS;  Service: Cardiovascular;  Laterality: N/A;   CARDIOVERSION N/A 05/10/2023   Procedure: CARDIOVERSION;  Surgeon: Antonieta Iba, MD;  Location: [facility] ORS;  Service: Cardiovascular;  Laterality: N/A;   CARPAL TUNNEL RELEASE Right 10/22/2018   Procedure: CARPAL TUNNEL RELEASE;  Surgeon: Donato Heinz, MD;  Location: [facility] ORS;  Service: Orthopedics;  Laterality: Right;   CARPAL TUNNEL RELEASE Left 05/22/2019    Procedure: CARPAL TUNNEL RELEASE - LEFT;  Surgeon: Donato Heinz, MD;  Location: [facility] ORS;  Service: Orthopedics;  Laterality: Left;   COLONOSCOPY  2018   COLONOSCOPY N/A 10/21/2020   Procedure: COLONOSCOPY;  Surgeon: Earline Mayotte, MD;  Location: [facility] ENDOSCOPY;  Service: Endoscopy;  Laterality: N/A;   HERNIA REPAIR  10/23/2019   umbilical hernia repair   INSERT / REPLACE / REMOVE PACEMAKER     PACEMAKER INSERTION  11-21-14   STJ Assurity dual chamber pacemaker implanted by Dr Johney Frame for CHB   PERMANENT PACEMAKER INSERTION N/A 11/21/2014   Procedure: PERMANENT PACEMAKER INSERTION;  Surgeon: Hillis Range, MD;  Location: [facility] CATH LAB;  Service: Cardiovascular;  Laterality: N/A;   TEE WITHOUT CARDIOVERSION N/A 11/21/2016   Procedure: TRANSESOPHAGEAL ECHOCARDIOGRAM (TEE);  Surgeon: Duke Salvia, MD;  Location: [facility] ORS;  Service: Cardiovascular;  Laterality: N/A;   TEE WITHOUT CARDIOVERSION N/A 11/09/2022   Procedure: TRANSESOPHAGEAL ECHOCARDIOGRAM;  Surgeon: Debbe Odea, MD;  Location: [facility] ORS;  Service: Cardiovascular;  Laterality: N/A;   UPPER GI ENDOSCOPY      Current Outpatient Medications  Medication Sig Dispense Refill   acetaminophen (TYLENOL) 500 MG tablet Take 650 mg by mouth in the morning, at noon, and at bedtime.     allopurinol (ZYLOPRIM) 300 MG tablet Take 400 mg by mouth daily. 300 mg tablet + 100 mg tablet once daily  3   amiodarone (PACERONE) 200 MG tablet Take 2 tablets (400 mg total) by mouth 2 (two) times daily for 14 days, THEN 2 tablets (400 mg total) daily for 14 days, THEN 1 tablet (200 mg total) daily. 110 tablet 3   bisacodyl (DULCOLAX) 5 MG EC tablet Take 5 mg by mouth 2 (two) times daily.     carvedilol (COREG) 3.125 MG tablet Take 3.125 mg by mouth 2 (two) times daily.     cholecalciferol (VITAMIN D3) 25 MCG (1000 UNIT) tablet Take 2,000 Units by mouth daily.     Cyanocobalamin (VITAMIN B 12) 500 MCG TABS Take 1,000 mcg by mouth daily.      desloratadine (CLARINEX) 5 MG tablet Take 5 mg by mouth at bedtime.      ELIQUIS 5 MG TABS tablet TAKE 1 TABLET BY MOUTH TWICE A DAY 180 tablet 1   FLUoxetine (PROZAC) 10 MG capsule Take 20 mg by mouth daily.     fluticasone (FLONASE) 50 MCG/ACT nasal spray Place 2 sprays into the nose daily as needed for allergies.      lansoprazole (PREVACID) 15 MG capsule Take 15 mg by mouth daily as needed (indigestion).      magnesium oxide (MAG-OX) 400 MG tablet Take 400 mg by mouth at bedtime.     metolazone (ZAROXOLYN) 2.5 MG tablet TAKE 1 TABLET BY MOUTH DAILY AS NEEDED (ONCE DAILY AS NEEDED FOR WEIGHT GAIN  OR SWELLING.). 90 tablet 3   Multiple Vitamins-Minerals (OCUVITE ADULT 50+ PO) Take 1 tablet by mouth daily.     polyethylene glycol (MIRALAX / GLYCOLAX) packet Take 17 g by mouth daily as needed (constipation.).      potassium chloride (KLOR-CON) 10 MEQ tablet Take 30 mEq by mouth daily.     pramipexole (MIRAPEX) 1 MG tablet Take 1-2 mg by mouth 3 (three) times daily. 2 tablets QAM, 1 tablet at lunch, and 1 tablet at night     pregabalin (LYRICA) 25 MG capsule Take 25 mg by mouth every other day.     Red Yeast Rice 600 MG CAPS Take 600 mg by mouth daily.     sacubitril-valsartan (ENTRESTO) 24-26 MG TAKE 1 TABLET BY MOUTH TWICE A DAY 60 tablet 4   selegiline (ELDEPRYL) 5 MG capsule Take 5 mg by mouth 2 (two) times daily with a meal.      SENEXON-S 8.6-50 MG tablet Take 2 tablets by mouth 2 (two) times daily.     torsemide (DEMADEX) 20 MG tablet Take 60 mg by mouth daily.     traMADol (ULTRAM) 50 MG tablet      Vibegron (GEMTESA) 75 MG TABS Take 1 tablet (75 mg total) by mouth daily. 30 tablet 11   No current facility-administered medications for this visit.    Allergies  Allergen Reactions   Linaclotide Shortness Of Breath and Other (See Comments)   Codeine Diarrhea, Nausea And Vomiting and Other (See Comments)   Terazosin Other (See Comments)    Dizziness / altered mental state  Dizziness /  altered mental state  Dizziness / altered mental state    Gabapentin Other (See Comments)    dizziness Other reaction(s): Other (See Comments) dizziness   Hydrocodone-Acetaminophen Diarrhea, Nausea And Vomiting and Other (See Comments)   Lubiprostone Other (See Comments)    Abdominal cramps Other reaction(s): Other (See Comments) Abdominal cramps Abdominal cramps   Norco [Hydrocodone-Acetaminophen] Diarrhea and Nausea And Vomiting   Omeprazole Diarrhea, Nausea And Vomiting and Other (See Comments)    Stomach cramps   Oxycodone Nausea And Vomiting   Ropinirole Nausea Only and Other (See Comments)    Review of Systems negative except from HPI and PMH  Physical Exam BP (!) 110/58   Pulse 60   Ht 6' (1.829 m)   Wt 249 lb (112.9 kg)   SpO2 93%   BMI 33.77 kg/m  Well developed and well nourished in no acute distress HENT normal Neck supple with JVP-flat Clear severe kyphosis Device pocket well healed; without hematoma or erythema.  There is no tethering  Regular rate and rhythm, no  gallop No / murmur Abd-soft with active BS No Clubbing cyanosis  edema Skin-warm and dry A & Oriented  Grossly normal sensory and motor function  ECG AV pacing at 60 Intervals 19/24/55 Rs  lead  1 and upright QRS lead I   Device function is normal. Programming changes none   See Paceart for details      Assessment and  Plan  Complete heart block  Sinus arrest  Atrial fibrillation-persistent  Pacemaker dual-chamber-St. Jude    Broad QRS  Parkinson's disease  Renal insuff gd 3  HFpEF  chronic class III      Persistent heart block paced 100% of the time with a very broad QRS.  Interval LV function deterioration noted January; discussed the possibility of pacemaker associated cardiomyopathy and will repeat the echocardiogram.  In the interim, we will continue  him on carvedilol Entresto and will add low-dose spironolactone at 12.5 mg daily.  Check a metabolic profile in 2  weeks.  Holding sinus rhythm on atrial fibrillation and feels considerably better.  Will continue the amiodarone at 200 mg a day. Continue apixaban 5 twice daily dosed for age and weight  Volume status is considerably improved.  Continue him on Zaroxolyn as needed and 60 mg of torsemide daily.  Despondency is somewhat better.

## 2023-05-30 NOTE — Patient Instructions (Signed)
Medication Instructions:  START Spirolactone 12.5 mg daily  . *If you need a refill on your cardiac medications before your next appointment, please call your pharmacy*   Lab Work: Your provider would like for you to have following labs drawn today TSH, BMET.  BMET in 2 weeks (no lab appointment needed)   If you have labs (blood work) drawn today and your tests are completely normal, you will receive your results only by: MyChart Message (if you have MyChart) OR A paper copy in the mail If you have any lab test that is abnormal or we need to change your treatment, we will call you to review the results.   Testing/Procedures: Echocardiogram (2 months) - Your physician has requested that you have an echocardiogram. Echocardiography is a painless test that uses sound waves to create images of your heart. It provides your doctor with information about the size and shape of your heart and how well your heart's chambers and valves are working. This procedure takes approximately one hour. There are no restrictions for this procedure.     Follow-Up: At [facility], you and your health needs are our priority.  As part of our continuing mission to provide you with exceptional heart care, we have created designated Provider Care Teams.  These Care Teams include your primary Cardiologist (physician) and Advanced Practice Providers (APPs -  Physician Assistants and Nurse Practitioners) who all work together to provide you with the care you need, when you need it.  We recommend signing up for the patient portal called "MyChart".  Sign up information is provided on this After Visit Summary.  MyChart is used to connect with patients for Virtual Visits (Telemedicine).  Patients are able to view lab/test results, encounter notes, upcoming appointments, etc.  Non-urgent messages can be sent to your provider as well.   To learn more about what you can do with MyChart, go to [email or web address].     Your next appointment:   3 month(s)  Provider:   Sherryl Manges, MD

## 2023-05-31 LAB — BASIC METABOLIC PANEL
BUN/Creatinine Ratio: 25 — ABNORMAL HIGH (ref 10–24)
BUN: 46 mg/dL — ABNORMAL HIGH (ref 8–27)
CO2: 23 mmol/L (ref 20–29)
Calcium: 9.3 mg/dL (ref 8.6–10.2)
Chloride: 104 mmol/L (ref 96–106)
Creatinine, Ser: 1.83 mg/dL — ABNORMAL HIGH (ref 0.76–1.27)
Glucose: 83 mg/dL (ref 70–99)
Potassium: 4.4 mmol/L (ref 3.5–5.2)
Sodium: 140 mmol/L (ref 134–144)
eGFR: 38 mL/min/{1.73_m2} — ABNORMAL LOW (ref >59)

## 2023-05-31 LAB — TSH: TSH: 2.75 u[IU]/mL (ref 0.450–4.500)

## 2023-06-05 ENCOUNTER — Other Ambulatory Visit: Payer: Self-pay | Admitting: Cardiovascular Disease

## 2023-06-06 ENCOUNTER — Other Ambulatory Visit: Payer: Self-pay | Admitting: Internal Medicine

## 2023-06-13 ENCOUNTER — Other Ambulatory Visit: Payer: Self-pay

## 2023-06-13 DIAGNOSIS — Z950 Presence of cardiac pacemaker: Secondary | ICD-10-CM

## 2023-06-13 DIAGNOSIS — I4892 Unspecified atrial flutter: Secondary | ICD-10-CM

## 2023-06-13 DIAGNOSIS — I4819 Other persistent atrial fibrillation: Secondary | ICD-10-CM

## 2023-06-13 DIAGNOSIS — I442 Atrioventricular block, complete: Secondary | ICD-10-CM

## 2023-06-13 DIAGNOSIS — I25118 Atherosclerotic heart disease of native coronary artery with other forms of angina pectoris: Secondary | ICD-10-CM

## 2023-06-13 DIAGNOSIS — I5022 Chronic systolic (congestive) heart failure: Secondary | ICD-10-CM

## 2023-06-14 LAB — BASIC METABOLIC PANEL
BUN/Creatinine Ratio: 23 (ref 10–24)
BUN: 54 mg/dL — ABNORMAL HIGH (ref 8–27)
CO2: 20 mmol/L (ref 20–29)
Calcium: 9.4 mg/dL (ref 8.6–10.2)
Chloride: 105 mmol/L (ref 96–106)
Creatinine, Ser: 2.36 mg/dL — ABNORMAL HIGH (ref 0.76–1.27)
Glucose: 90 mg/dL (ref 70–99)
Potassium: 4.8 mmol/L (ref 3.5–5.2)
Sodium: 143 mmol/L (ref 134–144)
eGFR: 28 mL/min/{1.73_m2} — ABNORMAL LOW (ref >59)

## 2023-06-15 ENCOUNTER — Other Ambulatory Visit: Payer: Self-pay

## 2023-06-15 DIAGNOSIS — N1832 Chronic kidney disease, stage 3b: Secondary | ICD-10-CM

## 2023-06-21 ENCOUNTER — Other Ambulatory Visit: Payer: Self-pay

## 2023-06-21 DIAGNOSIS — N1832 Chronic kidney disease, stage 3b: Secondary | ICD-10-CM

## 2023-06-22 LAB — BASIC METABOLIC PANEL
BUN/Creatinine Ratio: 22 (ref 10–24)
BUN: 44 mg/dL — ABNORMAL HIGH (ref 8–27)
CO2: 22 mmol/L (ref 20–29)
Calcium: 9.3 mg/dL (ref 8.6–10.2)
Chloride: 105 mmol/L (ref 96–106)
Creatinine, Ser: 1.96 mg/dL — ABNORMAL HIGH (ref 0.76–1.27)
Glucose: 100 mg/dL — ABNORMAL HIGH (ref 70–99)
Potassium: 4.7 mmol/L (ref 3.5–5.2)
Sodium: 142 mmol/L (ref 134–144)
eGFR: 35 mL/min/{1.73_m2} — ABNORMAL LOW (ref >59)

## 2023-07-21 ENCOUNTER — Telehealth: Payer: Self-pay | Admitting: Cardiovascular Disease

## 2023-07-21 NOTE — Telephone Encounter (Signed)
Pre-operative Risk Assessment    Patient Name: Matthew Howe  DOB: [birth year] MRN: [id number]      Request for Surgical Clearance    Procedure:  Colonoscopy  Date of Surgery:  Clearance 11/01/23                                 Surgeon:  not indicated Surgeon's Group or Practice Name:  [facility] Colonoscopy Phone number:  [phone] Fax number:  [phone]   Type of Clearance Requested:  Pharmacy, hold Eliquis    Type of Anesthesia:  Not Indicated   Additional requests/questions:    Signed, Narda Amber   07/21/2023, 1:38 PM

## 2023-07-26 NOTE — Telephone Encounter (Signed)
Patient with diagnosis of afib on Eliquis for anticoagulation.    Procedure: colonoscopy Date of procedure: 11/01/23  CHA2DS2-VASc Score = 5  This indicates a 7.2% annual risk of stroke. The patient's score is based upon: CHF History: 1 HTN History: 1 Diabetes History: 0 Stroke History: 0 Vascular Disease History: 1 Age Score: 2 Gender Score: 0   Underwent cardioversion 05/10/23.  CrCl 69mL/min Platelet count 201K  Per office protocol, patient can hold Eliquis for 1-2 days prior to procedure.    **This guidance is not considered finalized until pre-operative APP has relayed final recommendations.**

## 2023-07-26 NOTE — Telephone Encounter (Signed)
Patient Name: Matthew Howe  DOB: Mar 10, 1946 MRN: [id number]  Primary Cardiologist: Julien Nordmann, MD  Chart reviewed as part of pre-operative protocol coverage. Pre-op clearance already addressed by colleagues in earlier phone notes. To summarize recommendations:  -Per office protocol, patient can hold Eliquis for 1-2 days prior to procedure.  Please resume when medically safe to do so.  No medical clearance was requested.  Will route this bundled recommendation to requesting provider via Epic fax function and remove from pre-op pool. Please call with questions.  Sharlene Dory, PA-C 07/26/2023, 8:35 AM

## 2023-07-31 ENCOUNTER — Ambulatory Visit: Payer: Medicare Other | Attending: Internal Medicine

## 2023-07-31 DIAGNOSIS — I5022 Chronic systolic (congestive) heart failure: Secondary | ICD-10-CM | POA: Diagnosis present

## 2023-07-31 MED ORDER — PERFLUTREN LIPID MICROSPHERE
1.0000 mL | INTRAVENOUS | Status: AC | PRN
Start: 2023-07-31 — End: 2023-07-31
  Administered 2023-07-31: 3 mL via INTRAVENOUS

## 2023-08-01 ENCOUNTER — Ambulatory Visit (INDEPENDENT_AMBULATORY_CARE_PROVIDER_SITE_OTHER): Payer: Medicare Other

## 2023-08-01 DIAGNOSIS — I442 Atrioventricular block, complete: Secondary | ICD-10-CM | POA: Diagnosis not present

## 2023-08-01 LAB — ECHOCARDIOGRAM COMPLETE
Area-P 1/2: 3.48 cm2
S' Lateral: 4.7 cm

## 2023-08-03 LAB — CUP PACEART REMOTE DEVICE CHECK
Battery Remaining Longevity: 11 mo
Battery Remaining Percentage: 10 %
Battery Voltage: 2.83 V
Brady Statistic AP VP Percent: 99 %
Brady Statistic AP VS Percent: 1 %
Brady Statistic AS VP Percent: 1 %
Brady Statistic AS VS Percent: 1 %
Brady Statistic RA Percent Paced: 79 %
Brady Statistic RV Percent Paced: 99 %
Date Time Interrogation Session: 20241022020014
Implantable Lead Connection Status: 753985
Implantable Lead Connection Status: 753985
Implantable Lead Implant Date: 20160212
Implantable Lead Implant Date: 20160212
Implantable Lead Location: 753859
Implantable Lead Location: 753860
Implantable Lead Model: 1948
Implantable Pulse Generator Implant Date: 20160212
Lead Channel Impedance Value: 480 Ohm
Lead Channel Impedance Value: 560 Ohm
Lead Channel Pacing Threshold Amplitude: 0.625 V
Lead Channel Pacing Threshold Amplitude: 1.375 V
Lead Channel Pacing Threshold Pulse Width: 0.4 ms
Lead Channel Pacing Threshold Pulse Width: 0.4 ms
Lead Channel Sensing Intrinsic Amplitude: 12 mV
Lead Channel Sensing Intrinsic Amplitude: 4.6 mV
Lead Channel Setting Pacing Amplitude: 1.625
Lead Channel Setting Pacing Amplitude: 1.625
Lead Channel Setting Pacing Pulse Width: 0.4 ms
Lead Channel Setting Sensing Sensitivity: 4 mV
Pulse Gen Model: 2240
Pulse Gen Serial Number: 3049931

## 2023-08-09 ENCOUNTER — Telehealth: Payer: Self-pay | Admitting: Cardiovascular Disease

## 2023-08-09 ENCOUNTER — Other Ambulatory Visit: Payer: Self-pay | Admitting: Cardiovascular Disease

## 2023-08-09 NOTE — Telephone Encounter (Signed)
 Please reviewed

## 2023-08-09 NOTE — Telephone Encounter (Signed)
Called and spoke with wife per DPR. Per wife patient has plenty of Potassium and does not need a refill at this time.

## 2023-08-09 NOTE — Telephone Encounter (Signed)
Patient is requesting 40 MEQ of potassium.  Looks like per patients chart he is taking 30 MEQ of potassium  Can you please advise which dose patient should be taking.   Thank you!

## 2023-08-09 NOTE — Telephone Encounter (Signed)
*  STAT* If patient is at the pharmacy, call can be transferred to refill team.   1. Which medications need to be refilled? (please list name of each medication and dose if known)   ELIQUIS 5 MG TABS tablet   2. Would you like to learn more about the convenience, safety, & potential cost savings by using the [facility]?   3. Are you open to using the [facility] (Type [facility]. ).  4. Which pharmacy/location (including street and city if local pharmacy) is medication to be sent to?  [id number] - [place], [place] - [address]   5. Do they need a 30 day or 90 day supply?   73  Wife stated patient still has some of this medication.

## 2023-08-09 NOTE — Telephone Encounter (Signed)
Prescription refill request for Eliquis received. Indication:afib Last office visit:8/24 Scr:1.96  9/24 Age: 77 Weight:112.9  kg  Prescription refilled

## 2023-08-17 ENCOUNTER — Telehealth: Payer: Self-pay | Admitting: Cardiovascular Disease

## 2023-08-17 NOTE — Telephone Encounter (Signed)
Dr. Malvin Johns would like to start patient on either seroquel or risperdal and he would like to know which one Dr. Mariah Milling thinks would be safer for patient given patient's cardiac history.

## 2023-08-18 ENCOUNTER — Encounter: Payer: Self-pay | Admitting: Cardiovascular Disease

## 2023-08-18 NOTE — Telephone Encounter (Signed)
Called and spoke with wife per DPR. Informed her of the following from Dr. Mariah Milling.  I will CC Dr. Graciela Husbands  Both Seroquel and risperidone are on the list for QT prolonging medications which may not mix well with his amiodarone  Thx  TGollan   Wife verbalizes understanding. Wife reports that she will contact Dr. Malvin Johns.

## 2023-08-18 NOTE — Progress Notes (Signed)
Remote pacemaker transmission.   

## 2023-08-18 NOTE — Telephone Encounter (Signed)
Attempted to call Dr. Daisy Blossom office with but the office is closed. Called and spoke with wife per DPR. Informed her of the following from Dr. Mariah Milling.  I will CC Dr. Graciela Husbands  Both Seroquel and risperidone are on the list for QT prolonging medications which may not mix well with his amiodarone  Thx  TGollan   Wife verbalizes understanding. Wife reports that she will contact Dr. Malvin Johns.

## 2023-08-22 NOTE — Telephone Encounter (Signed)
As Dr Knute Neu has noted both drugs are problematic and better avoided,  However like all things the decision is a risk benefit assessment. Some of his drug risk can be mitigatted by following ECG with initiation,  before and 2 weeks after would be reasonable ( both have relatively short hlaf life)

## 2023-08-23 ENCOUNTER — Telehealth: Payer: Self-pay | Admitting: Cardiovascular Disease

## 2023-08-23 NOTE — Telephone Encounter (Signed)
Pt c/o medication issue:  1. Name of Medication: Namenda  2. How are you currently taking this medication (dosage and times per day)? 5 twice a day  3. Are you having a reaction (difficulty breathing--STAT)? No   4. What is your medication issue? Dr Daisy Blossom  office wants to make sure it is ok to start pt on this medication with his cardiac issues. She would like a call back

## 2023-08-29 NOTE — Telephone Encounter (Signed)
Called patient and left a message for call back  

## 2023-08-29 NOTE — Telephone Encounter (Signed)
Called wife and notified her of the following from Dr. Mariah Milling.  There may be less of a risk in using Namenda in terms of QT prolongation but we would have to be on alert for other potential side effects I would suggest if Namenda to be started, Will get EKG with CHF clinic tomorrow He could start the medication after EKG I can do repeat EKG when he sees me early December in clinic Thx TGollan   Wife verbalizes understanding.   Dr. Daisy Blossom office also notified of the above from Dr. Mariah Milling.

## 2023-08-30 ENCOUNTER — Encounter: Payer: Self-pay | Admitting: Family

## 2023-08-30 ENCOUNTER — Ambulatory Visit: Payer: Medicare Other | Attending: Family | Admitting: Family

## 2023-08-30 VITALS — BP 104/54 | HR 60 | Ht 72.0 in | Wt 252.0 lb

## 2023-08-30 DIAGNOSIS — R442 Other hallucinations: Secondary | ICD-10-CM | POA: Diagnosis not present

## 2023-08-30 DIAGNOSIS — N189 Chronic kidney disease, unspecified: Secondary | ICD-10-CM | POA: Insufficient documentation

## 2023-08-30 DIAGNOSIS — I10 Essential (primary) hypertension: Secondary | ICD-10-CM | POA: Diagnosis not present

## 2023-08-30 DIAGNOSIS — R9431 Abnormal electrocardiogram [ECG] [EKG]: Secondary | ICD-10-CM | POA: Diagnosis not present

## 2023-08-30 DIAGNOSIS — I5032 Chronic diastolic (congestive) heart failure: Secondary | ICD-10-CM | POA: Insufficient documentation

## 2023-08-30 DIAGNOSIS — I480 Paroxysmal atrial fibrillation: Secondary | ICD-10-CM | POA: Diagnosis not present

## 2023-08-30 DIAGNOSIS — I428 Other cardiomyopathies: Secondary | ICD-10-CM | POA: Diagnosis not present

## 2023-08-30 DIAGNOSIS — G20A1 Parkinson's disease without dyskinesia, without mention of fluctuations: Secondary | ICD-10-CM | POA: Insufficient documentation

## 2023-08-30 DIAGNOSIS — Z7901 Long term (current) use of anticoagulants: Secondary | ICD-10-CM | POA: Insufficient documentation

## 2023-08-30 DIAGNOSIS — I130 Hypertensive heart and chronic kidney disease with heart failure and stage 1 through stage 4 chronic kidney disease, or unspecified chronic kidney disease: Secondary | ICD-10-CM | POA: Diagnosis present

## 2023-08-30 DIAGNOSIS — Z950 Presence of cardiac pacemaker: Secondary | ICD-10-CM | POA: Insufficient documentation

## 2023-08-30 NOTE — Progress Notes (Signed)
PCP: Clydie Braun, MD (last seen 09/24) Primary Cardiologist: Julien Nordmann, MD (last seen 07/24) EP: Sherryl Manges, MD (last seen 08/24)  HPI:  This is a 77 y/o male with a history of parkinson's, HTN, hyperlipidemia, CKD, GERD, CAD, complete heart block (pacemaker), atrial fibrillation, AICD and chronic heart failure.   Admitted 11/04/22 due to sepsis with acute respiratory failure. Blood cultures positive. Echo showed possible vegetation on pacemaker lead. Poor extraction candidate. PICC line placed for 2 weeks of antibiotics. Was in the ED 11/11/22 due to PICC line being dislodged. PICC line replaced.   Echo 07/28/15: EF of 55-60% along with mild AR.  Echo 12/08/16: EF of 55-60% along with mild AR/MR. TEE 11/09/22: EF 40-45% along with vegetation attached to RA pacemaker lead and mild/moderate AR.   Echo 07/31/23: EF 50-55% with mild LVH, severe LAE, mild MR, mild/ moderate AR, mild dilatation of the aortic root, measuring 40 mm. There is mild dilatation of the ascending aorta, measuring 41 mm.   Cardiac catheterization 11/20/12:  mild nonobstructive disease.  He presents today for a HF f/u visit with a chief complaint of moderate fatigue with little exertion. Chronic in nature and some days worse than other. Has associated shortness of breath, pedal edema, bloating, unsteadiness, difficulty sleeping and visual hallucinations along with this. Denies chest pain, palpitations or cough. Frustrated with his health and inability to lose weight. Not able to exercise due to symptoms. Wearing compression socks daily. Does have some swelling today as his wife says that patient hasn't elevated his legs much the last 2 days.   Taking alternating doses of torsemide per nephrology. Alternating 20mg / 40mg  doses.   Neurology is wanting to start namenda but cardiology office requested an EKG be done first. They will repeat EKG at their visit to follow for potential for prolonged QT.  ROS: All systems  negative except as listed in HPI, PMH and Problem List.  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  Tobacco Use   Smoking status: Never    Passive exposure: Never   Smokeless tobacco: Never  Vaping Use   Vaping status: Never Used  Substance and Sexual Activity   Alcohol use: No    Alcohol/week: 0.0 standard drinks of alcohol   Drug use: No   Sexual activity: Never  Other Topics Concern   Not on file  Social History Narrative   Not on file   Social Determinants of Health   Financial Resource Strain: Low Risk  (03/31/2023)   Received from [facility], [facility]   Overall Financial Resource Strain (CARDIA)    Difficulty of Paying Living Expenses: Not hard at all  Food Insecurity: No Food Insecurity (03/31/2023)   Received from [facility], [facility]   Hunger Vital Sign    Worried About Running Out of Food in the Last Year: Never true    Ran Out of Food in the Last Year: Never true  Transportation Needs: No Transportation Needs (03/31/2023)   Received from [facility], [facility]   PRAPARE - Transportation    In the past 12 months, has lack of transportation kept you from medical appointments or  from getting medications?: No    Lack of Transportation (Non-Medical): No  Physical Activity: Insufficiently Active (10/26/2017)   Exercise Vital Sign    Days of Exercise per Week: 7 days    Minutes of Exercise per Session: 20 min  Stress: No Stress Concern Present (10/26/2017)   [employer] of Occupational Health - Occupational Stress Questionnaire    Feeling of Stress : Only a little  Social Connections: Moderately Integrated  (10/26/2017)   Social Connection and Isolation Panel [NHANES]    Frequency of Communication with Friends and Family: More than three times a week    Frequency of Social Gatherings with Friends and Family: Never    Attends Religious Services: More than 4 times per year    Active Member of [employer] or Organizations: No    Attends Banker Meetings: Never    Marital Status: Married  Catering manager Violence: Not At Risk (10/26/2017)   Humiliation, Afraid, Rape, and Kick questionnaire    Fear of Current or Ex-Partner: No    Emotionally Abused: No    Physically Abused: No    Sexually Abused: No    FH:  Family History  Problem Relation Age of Onset   Heart disease Mother    Heart disease Father    Colon cancer Father    Stroke Sister     Past Medical History:  Diagnosis Date   Arthritis    Atrial fibrillation (HCC)    Atrial fibrillation (HCC)    Carpal tunnel syndrome on both sides    CHF (congestive heart failure) (HCC)    CKD (chronic kidney disease) 10/2018   recent diagnosis   Complete heart block (HCC)    a. s/p St. Jude PPM 11/2014.   Complication of anesthesia    stayed overnight dt under too long d/t taking Parkinson's medication (2013?)   Coronary artery disease    GERD (gastroesophageal reflux disease)    "not as bad as it used to be"   Heart murmur    History of recent blood transfusion 07/2018   Hyperlipidemia    Hypertension    IBS (irritable bowel syndrome)    Lower extremity edema    Parkinson's disease    Presence of permanent cardiac pacemaker    St Jude   Shortness of breath dyspnea     Current Outpatient Medications  Medication Sig Dispense Refill   acetaminophen (TYLENOL) 500 MG tablet Take 650 mg by mouth in the morning, at noon, and at bedtime.     allopurinol (ZYLOPRIM) 300 MG tablet Take 400 mg by mouth daily. 300 mg tablet + 100 mg tablet once daily  3   amiodarone (PACERONE) 200 MG tablet Take 1 tablet (200 mg total) by mouth daily.  90 tablet 0   bisacodyl (DULCOLAX) 5 MG EC tablet Take 5 mg by mouth 2 (two) times daily.     carvedilol (COREG) 3.125 MG tablet Take 3.125 mg by mouth 2 (two) times daily.     cholecalciferol (VITAMIN D3) 25 MCG (1000 UNIT) tablet Take 2,000 Units by mouth daily.     Cyanocobalamin (VITAMIN B 12) 500 MCG TABS Take 1,000 mcg by mouth daily.     desloratadine (CLARINEX) 5 MG tablet Take 5 mg by mouth at bedtime.      ELIQUIS 5 MG TABS tablet TAKE 1 TABLET BY MOUTH TWICE A DAY 180 tablet 1   FLUoxetine (PROZAC) 10 MG capsule Take 20 mg by mouth daily.     fluticasone (  FLONASE) 50 MCG/ACT nasal spray Place 2 sprays into the nose daily as needed for allergies.      lansoprazole (PREVACID) 15 MG capsule Take 15 mg by mouth daily as needed (indigestion).      magnesium oxide (MAG-OX) 400 MG tablet Take 400 mg by mouth at bedtime.     metolazone (ZAROXOLYN) 2.5 MG tablet TAKE 1 TABLET BY MOUTH DAILY AS NEEDED (ONCE DAILY AS NEEDED FOR WEIGHT GAIN OR SWELLING.). 90 tablet 3   Multiple Vitamins-Minerals (OCUVITE ADULT 50+ PO) Take 1 tablet by mouth daily.     polyethylene glycol (MIRALAX / GLYCOLAX) packet Take 17 g by mouth daily as needed (constipation.).      potassium chloride (KLOR-CON) 10 MEQ tablet Take 30 mEq by mouth daily.     pramipexole (MIRAPEX) 1 MG tablet Take 1-2 mg by mouth 3 (three) times daily. 2 tablets QAM, 1 tablet at lunch, and 1 tablet at night     pregabalin (LYRICA) 25 MG capsule Take 25 mg by mouth every other day.     Red Yeast Rice 600 MG CAPS Take 600 mg by mouth daily.     sacubitril-valsartan (ENTRESTO) 24-26 MG TAKE 1 TABLET BY MOUTH TWICE A DAY 60 tablet 4   selegiline (ELDEPRYL) 5 MG capsule Take 5 mg by mouth 2 (two) times daily with a meal.      SENEXON-S 8.6-50 MG tablet Take 2 tablets by mouth 2 (two) times daily.     spironolactone (ALDACTONE) 25 MG tablet Take 0.5 tablets (12.5 mg total) by mouth at bedtime. 90 tablet 3   torsemide (DEMADEX) 20 MG tablet Take 60  mg by mouth daily.     traMADol (ULTRAM) 50 MG tablet      Vibegron (GEMTESA) 75 MG TABS Take 1 tablet (75 mg total) by mouth daily. 30 tablet 11   No current facility-administered medications for this visit.   Vitals:   08/30/23 1359  BP: (!) 104/54  Pulse: 60  SpO2: 94%  Weight: 252 lb (114.3 kg)   Wt Readings from Last 3 Encounters:  08/30/23 252 lb (114.3 kg)  05/30/23 249 lb (112.9 kg)  05/10/23 245 lb (111.1 kg)   Lab Results  Component Value Date   CREATININE 1.96 (H) 06/21/2023   CREATININE 2.36 (H) 06/13/2023   CREATININE 1.83 (H) 05/30/2023   PHYSICAL EXAM:  General:  Well appearing. No resp difficulty HEENT: normal Neck: supple. JVP flat. No lymphadenopathy or thryomegaly appreciated. Cor: PMI normal. Regular rate & rhythm. No rubs, gallops or murmurs. Lungs: clear Abdomen: soft, nontender, nondistended. No hepatosplenomegaly. No bruits or masses.  Extremities: no cyanosis, clubbing, rash. 1+ pitting edema bilaterally (wearing compression socks) Neuro: alert.  Affect pleasant.  ECG: Dual-paced NSR. HR 65, QT 585/ QTc 580 (personally reviewed)  ASSESSMENT & PLAN:  1: NICM with now preserved ejection fraction- - NYHA class III - euvolemic - etiology likely atrial fibrillation as cath showed nonobstructive disease - weighing daily; Reminded to call for an overnight weight gain of >2 pounds or a weekly weight gain of >5 pounds - weight unchanged since last visit here 6 months ago - Echo 07/28/15: EF of 55-60% along with mild AR.  - Echo 12/08/16: EF of 55-60% along with mild AR/MR. - TEE 11/09/22: EF 40-45% along with vegetation attached to RA pacemaker lead and mild/moderate AR.   - Echo 07/31/23: EF 50-55% with mild LVH, severe LAE, mild MR, mild/ moderate AR, mild dilatation of the aortic root, measuring  40 mm. There is mild dilatation of the ascending aorta, measuring 41 mm. - cardiac catheterization 11/20/12:   mild nonobstructive disease. - not adding salt  to his food and wife is diligent about reading food labels - continue carvedilol 3.125mg  BID - continue metolazone 2.5mg  PRN - continue potassium daily - continue entresto 24/26mg  BID - continue torsemide 20mg  every other day/ alternating with 40mg  every other day - his wife says that she didn't know anything about spironolactone so patient hasn't been taking that; BP may not support spironolactone - saw cardiologist Mariah Milling) 07/24 - drinking 64 ounces of fluid daily - wearing compression socks daily - BNP 11/04/22 was 1216.0  2: HTN- - BP 104/54 - saw PCP Sampson Goon) 09/24 - BMP 07/12/23 reviewed and showed sodium 143, potassium 4.6, creatinine 1.9  and GFR 36 - saw nephrology Thedore Mins) 10/24  3: Atrial fibrillation- - cardioversion done 11/21/16, 02/08/23 & again 05/10/23 - saw EP Graciela Husbands) 08/24 - continue apixaban 5mg  BID - continue amiodarone 200mg  daily  4: Parkinson's Disease/ hallucinations- - continues to have tremors - hallucinations tend to occur when he wakes up - saw neurologist Malvin Johns) 10/24 - to start namenda but EKG needed to be done first; results per above - EKG will be repeated at cardiology visit next month.    Offered to not schedule another appointment due to increasing difficulty in getting patient to appointments but wife says that she'd like to make one for 6 months from now. Advised to call sooner if any issues.

## 2023-09-05 ENCOUNTER — Ambulatory Visit: Payer: Medicare Other | Attending: Internal Medicine | Admitting: Internal Medicine

## 2023-09-05 ENCOUNTER — Encounter: Payer: Self-pay | Admitting: Internal Medicine

## 2023-09-05 VITALS — BP 100/64 | HR 72 | Ht 72.0 in | Wt 250.6 lb

## 2023-09-05 DIAGNOSIS — I442 Atrioventricular block, complete: Secondary | ICD-10-CM

## 2023-09-05 DIAGNOSIS — Z79899 Other long term (current) drug therapy: Secondary | ICD-10-CM | POA: Insufficient documentation

## 2023-09-05 NOTE — Patient Instructions (Signed)
Medication Instructions:  Your Physician recommend you continue on your current medication as directed.    *If you need a refill on your cardiac medications before your next appointment, please call your pharmacy*   Lab Work: Your provider would like for you to have following labs drawn today Liver Function test, and TSH.   If you have labs (blood work) drawn today and your tests are completely normal, you will receive your results only by: MyChart Message (if you have MyChart) OR A paper copy in the mail If you have any lab test that is abnormal or we need to change your treatment, we will call you to review the results.   Follow-Up: At [facility], you and your health needs are our priority.  As part of our continuing mission to provide you with exceptional heart care, we have created designated Provider Care Teams.  These Care Teams include your primary Cardiologist (physician) and Advanced Practice Providers (APPs -  Physician Assistants and Nurse Practitioners) who all work together to provide you with the care you need, when you need it.  We recommend signing up for the patient portal called "MyChart".  Sign up information is provided on this After Visit Summary.  MyChart is used to connect with patients for Virtual Visits (Telemedicine).  Patients are able to view lab/test results, encounter notes, upcoming appointments, etc.  Non-urgent messages can be sent to your provider as well.   To learn more about what you can do with MyChart, go to [email or web address].    Your next appointment:   3 month(s)  Provider:   You may see Sherryl Manges, MD

## 2023-09-05 NOTE — Progress Notes (Signed)
Patient Care Team: Mick Sell, MD as PCP - General (Infectious Diseases) Antonieta Iba, MD as PCP - Cardiology (Cardiology) Duke Salvia, MD as PCP - Electrophysiology (Cardiology) Delma Freeze, FNP as Nurse Practitioner (Cardiology) Antonieta Iba, MD as Consulting Physician (Cardiology) Morene Crocker, MD as Referring Physician (Neurology)   HPI  Matthew Howe is a 77 y.o. male Seen following pacemaker implantation Abbott 2/16 by Dr. Fawn Kirk For complete heart block.  History of atrial fibrillation on flecainide  flecainide stopped 2/24 in the setting of an admission for sepsis and respiratory failure.  He underwent TEE because of streptococcal pneumoniae bacteremia.? 2/2  cellulitis.  Concern for vegetation on the RA lead but a fibrionous strand with low specificity for vegetation.  Outpatient antibiotics were recommended, repeat cultures were negative.  Extraction was not pursued because of comorbidities  anticoagulation with Apixaban  no  bleeding   DC cardioversion 5/24 successful unfortunately held for only about 3 days; it was the patient's impression as well as his wife's that he felt better.  Elected to begin amiodarone and repeat cardioversion 7/24  Neuropsychiatric drug recs complicated by concomitant use of amiodarone; TG and I have had different thresholds, and currently started on namenda   ; already on fluoxetine   Patient denies symptoms of respiratory, GI intolerance, sun sensitivity, neurological symptoms attributable to amiodarone.     The patient denies chest pain, shortness of breath, nocturnal dyspnea, orthopnea or peripheral edema.  There have been no palpitations, lightheadedness or syncope.  Complains of .   [name] DATE TEST EF   2/14 LHC  Non obstructive CAD  10/16 echo   55-60 %   3/18 Echo   55-60 %   6/20 Echo  55-60  Asc Ao 3.9 LVH mild LAE (-/53/38)  1/24 Echo  40-45%       Date Cr K Hgb LFT TSH  12/20 1.53 4.4 13.8    6/21  1.6 4.4 14.6    6/22 1.7 4.6 14.2    3/24 1.63 4.0 14.6    6/24 1.4<<2.2 4.1  14   8/24 1.96 4.7   2.75          Past Medical History:  Diagnosis Date   Arthritis    Atrial fibrillation (HCC)    Atrial fibrillation (HCC)    Carpal tunnel syndrome on both sides    CHF (congestive heart failure) (HCC)    CKD (chronic kidney disease) 10/2018   recent diagnosis   Complete heart block (HCC)    a. s/p St. Jude PPM 11/2014.   Complication of anesthesia    stayed overnight dt under too long d/t taking Parkinson's medication (2013?)   Coronary artery disease    GERD (gastroesophageal reflux disease)    "not as bad as it used to be"   Heart murmur    History of recent blood transfusion 07/2018   Hyperlipidemia    Hypertension    IBS (irritable bowel syndrome)    Lower extremity edema    Parkinson's disease (HCC)    Presence of permanent cardiac pacemaker    St Jude   Shortness of breath dyspnea     Past Surgical History:  Procedure Laterality Date   APPENDECTOMY  12/2010   BACK SURGERY  08/06/2018   lower lumbarsacral region, has rods in place. (done at [facility])   CARDIAC CATHETERIZATION  11/20/2012   Known CAD with one vessel coronary disease of  left descending artery by cardiac cath.    CARDIOVERSION N/A 11/21/2016   Procedure: CARDIOVERSION;  Surgeon: Duke Salvia, MD;  Location: [facility] ORS;  Service: Cardiovascular;  Laterality: N/A;   CARDIOVERSION N/A 03/08/2019   Procedure: CARDIOVERSION;  Surgeon: Pricilla Riffle, MD;  Location: [facility] ENDOSCOPY;  Service: Cardiovascular;  Laterality: N/A;   CARDIOVERSION N/A 02/08/2023   Procedure: CARDIOVERSION;  Surgeon: Debbe Odea, MD;  Location: [facility] ORS;  Service: Cardiovascular;  Laterality: N/A;   CARDIOVERSION N/A 05/10/2023   Procedure: CARDIOVERSION;  Surgeon: Antonieta Iba, MD;  Location: [facility] ORS;  Service: Cardiovascular;  Laterality: N/A;   CARPAL TUNNEL RELEASE Right 10/22/2018   Procedure: CARPAL TUNNEL RELEASE;   Surgeon: Donato Heinz, MD;  Location: [facility] ORS;  Service: Orthopedics;  Laterality: Right;   CARPAL TUNNEL RELEASE Left 05/22/2019   Procedure: CARPAL TUNNEL RELEASE - LEFT;  Surgeon: Donato Heinz, MD;  Location: [facility] ORS;  Service: Orthopedics;  Laterality: Left;   COLONOSCOPY  2018   COLONOSCOPY N/A 10/21/2020   Procedure: COLONOSCOPY;  Surgeon: Earline Mayotte, MD;  Location[facility] ENDOSCOPY;  Service: Endoscopy;  Laterality: N/A;   HERNIA REPAIR  10/23/2019   umbilical hernia repair   INSERT / REPLACE / REMOVE PACEMAKER     PACEMAKER INSERTION  11-21-14   STJ Assurity dual chamber pacemaker implanted by Dr Johney Frame for CHB   PERMANENT PACEMAKER INSERTION N/A 11/21/2014   Procedure: PERMANENT PACEMAKER INSERTION;  Surgeon: Hillis Range, MD;  Location: [facility] CATH LAB;  Service: Cardiovascular;  Laterality: N/A;   TEE WITHOUT CARDIOVERSION N/A 11/21/2016   Procedure: TRANSESOPHAGEAL ECHOCARDIOGRAM (TEE);  Surgeon: Duke Salvia, MD;  Location: [facility] ORS;  Service: Cardiovascular;  Laterality: N/A;   TEE WITHOUT CARDIOVERSION N/A 11/09/2022   Procedure: TRANSESOPHAGEAL ECHOCARDIOGRAM;  Surgeon: Debbe Odea, MD;  Location: [facility] ORS;  Service: Cardiovascular;  Laterality: N/A;   UPPER GI ENDOSCOPY      Current Outpatient Medications  Medication Sig Dispense Refill   acetaminophen (TYLENOL) 500 MG tablet Take 650 mg by mouth in the morning, at noon, and at bedtime.     allopurinol (ZYLOPRIM) 300 MG tablet Take 400 mg by mouth daily. 300 mg tablet + 100 mg tablet once daily  3   amiodarone (PACERONE) 200 MG tablet Take 1 tablet (200 mg total) by mouth daily. 90 tablet 0   bisacodyl (DULCOLAX) 5 MG EC tablet Take 5 mg by mouth 2 (two) times daily.     carvedilol (COREG) 3.125 MG tablet Take 3.125 mg by mouth 2 (two) times daily.     cholecalciferol (VITAMIN D3) 25 MCG (1000 UNIT) tablet Take 2,000 Units by mouth daily.     Cyanocobalamin (VITAMIN B 12) 500 MCG TABS Take 1,000 mcg by mouth  daily.     desloratadine (CLARINEX) 5 MG tablet Take 5 mg by mouth at bedtime.      ELIQUIS 5 MG TABS tablet TAKE 1 TABLET BY MOUTH TWICE A DAY 180 tablet 1   FLUoxetine (PROZAC) 10 MG capsule Take 30 mg by mouth daily.     fluticasone (FLONASE) 50 MCG/ACT nasal spray Place 2 sprays into the nose daily as needed for allergies.      lansoprazole (PREVACID) 15 MG capsule Take 15 mg by mouth daily as needed (indigestion).      magnesium oxide (MAG-OX) 400 MG tablet Take 400 mg by mouth at bedtime.     memantine (NAMENDA) 5 MG tablet Take 5 mg by mouth daily.  metolazone (ZAROXOLYN) 2.5 MG tablet TAKE 1 TABLET BY MOUTH DAILY AS NEEDED (ONCE DAILY AS NEEDED FOR WEIGHT GAIN OR SWELLING.). 90 tablet 3   Multiple Vitamins-Minerals (OCUVITE ADULT 50+ PO) Take 1 tablet by mouth daily.     polyethylene glycol (MIRALAX / GLYCOLAX) packet Take 17 g by mouth daily as needed (constipation.).      potassium chloride (KLOR-CON) 10 MEQ tablet Take 30 mEq by mouth daily.     pramipexole (MIRAPEX) 1 MG tablet Take 1-2 mg by mouth 3 (three) times daily. 2 tablets QAM, 1 tablet at lunch, and 1 tablet at night     pregabalin (LYRICA) 25 MG capsule Take 25 mg by mouth every other day.     Red Yeast Rice 600 MG CAPS Take 600 mg by mouth daily.     sacubitril-valsartan (ENTRESTO) 24-26 MG TAKE 1 TABLET BY MOUTH TWICE A DAY 60 tablet 4   selegiline (ELDEPRYL) 5 MG capsule Take 5 mg by mouth 2 (two) times daily with a meal.      SENEXON-S 8.6-50 MG tablet Take 2 tablets by mouth 2 (two) times daily.     torsemide (DEMADEX) 20 MG tablet Take 20 mg by mouth. 20mg  daily alternating with 40mg  daily     traMADol (ULTRAM) 50 MG tablet      Vibegron (GEMTESA) 75 MG TABS Take 1 tablet (75 mg total) by mouth daily. 30 tablet 11   No current facility-administered medications for this visit.    Allergies  Allergen Reactions   Linaclotide Shortness Of Breath and Other (See Comments)   Codeine Diarrhea, Nausea And Vomiting  and Other (See Comments)   Terazosin Other (See Comments)    Dizziness / altered mental state  Dizziness / altered mental state  Dizziness / altered mental state    Gabapentin Other (See Comments)    dizziness Other reaction(s): Other (See Comments) dizziness   Hydrocodone-Acetaminophen Diarrhea, Nausea And Vomiting and Other (See Comments)   Lubiprostone Other (See Comments)    Abdominal cramps Other reaction(s): Other (See Comments) Abdominal cramps Abdominal cramps   Norco [Hydrocodone-Acetaminophen] Diarrhea and Nausea And Vomiting   Omeprazole Diarrhea, Nausea And Vomiting and Other (See Comments)    Stomach cramps   Oxycodone Nausea And Vomiting   Ropinirole Nausea Only and Other (See Comments)    Review of Systems negative except from HPI and PMH  Physical Exam BP 100/64   Pulse 72   Ht 6' (1.829 m)   Wt 250 lb 9.6 oz (113.7 kg)   SpO2 95%   BMI 33.99 kg/m  Well developed and Morbidly obese  in no acute distress HENT normal Neck supple with JVP-flat Clear Device pocket well healed; without hematoma or erythema.  There is no tethering  Regular rate and rhythm, no  murmur Abd-soft with active BS No Clubbing cyanosis  edema Skin-warm and dry A & Oriented  Grossly normal sensory and motor function  ECG non  Device function is normal. Programming changes none  See Paceart for details      Assessment and  Plan  Complete heart block  Sinus arrest  Atrial fibrillation-persistent  Pacemaker dual-chamber-St. Jude    Broad QRS  Parkinson's disease  Renal insuff gd 3  HFpEF  chronic class III   Holding sinus rhythm on amiodarone.  Tolerating.  Need to check amiodarone surveillance laboratories.  Continue Eliquis dosed at 5 mg twice daily for age and weight  Hallucinations are a huge problem.  I am willing  to be more aggressive with neuropsychotropic medications given the severity of the symptoms and the impact on his living.  He will let us know if  the Namenda helps or not.

## 2023-09-06 ENCOUNTER — Other Ambulatory Visit: Payer: Self-pay | Admitting: Cardiovascular Disease

## 2023-09-06 LAB — HEPATIC FUNCTION PANEL
ALT: 22 [IU]/L (ref 0–44)
AST: 23 [IU]/L (ref 0–40)
Albumin: 3.9 g/dL (ref 3.8–4.8)
Alkaline Phosphatase: 76 [IU]/L (ref 44–121)
Bilirubin Total: 0.2 mg/dL (ref 0.0–1.2)
Bilirubin, Direct: 0.12 mg/dL (ref 0.00–0.40)
Total Protein: 6 g/dL (ref 6.0–8.5)

## 2023-09-06 LAB — TSH: TSH: 1.54 u[IU]/mL (ref 0.450–4.500)

## 2023-09-11 ENCOUNTER — Encounter: Payer: Self-pay | Admitting: Internal Medicine

## 2023-09-11 LAB — CUP PACEART INCLINIC DEVICE CHECK
Battery Remaining Longevity: 11 mo
Battery Voltage: 2.83 V
Brady Statistic RA Percent Paced: 85 %
Brady Statistic RV Percent Paced: 99.98 %
Date Time Interrogation Session: 20241126100600
Implantable Lead Connection Status: 753985
Implantable Lead Connection Status: 753985
Implantable Lead Implant Date: 20160212
Implantable Lead Implant Date: 20160212
Implantable Lead Location: 753859
Implantable Lead Location: 753860
Implantable Lead Model: 1948
Implantable Pulse Generator Implant Date: 20160212
Lead Channel Impedance Value: 512.5 Ohm
Lead Channel Impedance Value: 562.5 Ohm
Lead Channel Pacing Threshold Amplitude: 0.75 V
Lead Channel Pacing Threshold Amplitude: 0.75 V
Lead Channel Pacing Threshold Amplitude: 1.25 V
Lead Channel Pacing Threshold Amplitude: 1.25 V
Lead Channel Pacing Threshold Pulse Width: 0.4 ms
Lead Channel Pacing Threshold Pulse Width: 0.4 ms
Lead Channel Pacing Threshold Pulse Width: 0.4 ms
Lead Channel Pacing Threshold Pulse Width: 0.4 ms
Lead Channel Sensing Intrinsic Amplitude: 12 mV
Lead Channel Sensing Intrinsic Amplitude: 4.6 mV
Lead Channel Setting Pacing Amplitude: 1.625
Lead Channel Setting Pacing Amplitude: 1.625
Lead Channel Setting Pacing Pulse Width: 0.4 ms
Lead Channel Setting Sensing Sensitivity: 4 mV
Pulse Gen Model: 2240
Pulse Gen Serial Number: 3049931

## 2023-09-18 ENCOUNTER — Telehealth: Payer: Self-pay | Admitting: Cardiovascular Disease

## 2023-09-18 NOTE — Progress Notes (Unsigned)
Cardiology Office Note  Date:  09/18/2023   ID:  Matthew Howe, Matthew Howe 02-19-1946, MRN [id number]  PCP:  Mick Sell, MD   Cc: Shortness of breath, fatigue   HPI:  [name] is a 77 year old gentleman with diagnosis of  Parkinson's who currently works delivering car parts,  Hypertension,  cardiac catheterization showing mild CAD , February 2014  EF 55% hyperlipidemia,  s/p  pacemaker placement.complete heart block.  Found to have atrial fibrillation 11/22/2014 on pacemaker download, started on anticoagulation    hospitalization for paroxysmal atrial fibrillation/acute on chronic diastolic CHF Echocardiogram January 2024 and TEE 1/24 EF 40 to 45% cardioversion for atrial for flutter Feb 08, 2023 He presents today for weight gain, shortness of breath, leg edema, chronic diastolic CHF,Paroxysmal atrial fibrillation  Last seen by myself in clinic 7/24 Seen by EP, Dr. Graciela Husbands September 05, 2023  DC cardioversion 5/24 successful unfortunately held for only about 3 days; it was the patient's impression as well as his wife's that he felt better. Elected to begin amiodarone and repeat cardioversion 7/24  Maintaining normal sinus rhythm Worsening hallucinations Trying various neuropsychotropic medications  Currently alternating  torsemide 60/40 every other day Has not had to take any more metolazone Uses compression hose Reports energy is poor Has good days bad days but in general not able to do very much Presents today in a wheelchair  EKG personally reviewed by myself on todays visit      Other past medical history reviewed Hospitalized January 2024 sepsis, cellulitis, strep pneumo bacteremia Ejection fraction 40% Started on Entresto, Coreg, Lasix 60 daily, metolazone as needed Flecainide held in the setting of cardiomyopathy Continue on carvedilol, Eliquis Transesophageal echo completed, lead extraction not performed given comorbidities Treated with IV antibiotics through  PICC line  Completed 2 weeks ABX Has had follow-up in CHF clinic Working with PT, slow recovery, slower recovery secondary to Parkinson's Leg swelling has persisted  Weight relatively stable at home 250 to 255 Taking Lasix 60 daily Sedentary, legs down Has new sleeper chair, wears compression hose  Persistent atrial fib since 08/2018 noted on pacer download Cardioversion for atrial fib 03/08/2019 seen by Dr. Graciela Husbands 11/2018 , maintaining normal sinus rhythm on flecainide Flecainide held January 2024 Back in atrial flutter on EKG January 31, 2023  Recovered from hernia surgery January 2021  Echo 03/15/2019 ejection fraction of 50-55%.  PMH:   has a past medical history of Arthritis, Atrial fibrillation (HCC), Atrial fibrillation (HCC), Carpal tunnel syndrome on both sides, CHF (congestive heart failure) (HCC), CKD (chronic kidney disease) (10/2018), Complete heart block (HCC), Complication of anesthesia, Coronary artery disease, GERD (gastroesophageal reflux disease), Heart murmur, History of recent blood transfusion (07/2018), Hyperlipidemia, Hypertension, IBS (irritable bowel syndrome), Lower extremity edema, Parkinson's disease (HCC), Presence of permanent cardiac pacemaker, and Shortness of breath dyspnea.  PSH:    Past Surgical History:  Procedure Laterality Date   APPENDECTOMY  12/2010   BACK SURGERY  08/06/2018   lower lumbarsacral region, has rods in place. (done at [facility])   CARDIAC CATHETERIZATION  11/20/2012   Known CAD with one vessel coronary disease of left descending artery by cardiac cath.    CARDIOVERSION N/A 11/21/2016   Procedure: CARDIOVERSION;  Surgeon: Duke Salvia, MD;  Location: [facility] ORS;  Service: Cardiovascular;  Laterality: N/A;   CARDIOVERSION N/A 03/08/2019   Procedure: CARDIOVERSION;  Surgeon: Pricilla Riffle, MD;  Location: [facility] ENDOSCOPY;  Service: Cardiovascular;  Laterality: N/A;   CARDIOVERSION N/A 02/08/2023  Procedure: CARDIOVERSION;  Surgeon:  Debbe Odea, MD;  Location: [facility] ORS;  Service: Cardiovascular;  Laterality: N/A;   CARDIOVERSION N/A 05/10/2023   Procedure: CARDIOVERSION;  Surgeon: Antonieta Iba, MD;  Location: [facility] ORS;  Service: Cardiovascular;  Laterality: N/A;   CARPAL TUNNEL RELEASE Right 10/22/2018   Procedure: CARPAL TUNNEL RELEASE;  Surgeon: Donato Heinz, MD;  Location: [facility] ORS;  Service: Orthopedics;  Laterality: Right;   CARPAL TUNNEL RELEASE Left 05/22/2019   Procedure: CARPAL TUNNEL RELEASE - LEFT;  Surgeon: Donato Heinz, MD;  Location: [facility] ORS;  Service: Orthopedics;  Laterality: Left;   COLONOSCOPY  2018   COLONOSCOPY N/A 10/21/2020   Procedure: COLONOSCOPY;  Surgeon: Earline Mayotte, MD;  Location: [facility] ENDOSCOPY;  Service: Endoscopy;  Laterality: N/A;   HERNIA REPAIR  10/23/2019   umbilical hernia repair   INSERT / REPLACE / REMOVE PACEMAKER     PACEMAKER INSERTION  11-21-14   STJ Assurity dual chamber pacemaker implanted by Dr Johney Frame for CHB   PERMANENT PACEMAKER INSERTION N/A 11/21/2014   Procedure: PERMANENT PACEMAKER INSERTION;  Surgeon: Hillis Range, MD;  Location: [facility] CATH LAB;  Service: Cardiovascular;  Laterality: N/A;   TEE WITHOUT CARDIOVERSION N/A 11/21/2016   Procedure: TRANSESOPHAGEAL ECHOCARDIOGRAM (TEE);  Surgeon: Duke Salvia, MD;  Location: [facility] ORS;  Service: Cardiovascular;  Laterality: N/A;   TEE WITHOUT CARDIOVERSION N/A 11/09/2022   Procedure: TRANSESOPHAGEAL ECHOCARDIOGRAM;  Surgeon: Debbe Odea, MD;  Location: [facility] ORS;  Service: Cardiovascular;  Laterality: N/A;   UPPER GI ENDOSCOPY      Current Outpatient Medications  Medication Sig Dispense Refill   acetaminophen (TYLENOL) 500 MG tablet Take 650 mg by mouth in the morning, at noon, and at bedtime.     allopurinol (ZYLOPRIM) 300 MG tablet Take 400 mg by mouth daily. 300 mg tablet + 100 mg tablet once daily  3   amiodarone (PACERONE) 200 MG tablet Take 1 tablet (200 mg total) by mouth daily. 90 tablet 0    bisacodyl (DULCOLAX) 5 MG EC tablet Take 5 mg by mouth 2 (two) times daily.     carvedilol (COREG) 3.125 MG tablet Take 3.125 mg by mouth 2 (two) times daily.     cholecalciferol (VITAMIN D3) 25 MCG (1000 UNIT) tablet Take 2,000 Units by mouth daily.     Cyanocobalamin (VITAMIN B 12) 500 MCG TABS Take 1,000 mcg by mouth daily.     desloratadine (CLARINEX) 5 MG tablet Take 5 mg by mouth at bedtime.      ELIQUIS 5 MG TABS tablet TAKE 1 TABLET BY MOUTH TWICE A DAY 180 tablet 1   FLUoxetine (PROZAC) 10 MG capsule Take 30 mg by mouth daily.     fluticasone (FLONASE) 50 MCG/ACT nasal spray Place 2 sprays into the nose daily as needed for allergies.      lansoprazole (PREVACID) 15 MG capsule Take 15 mg by mouth daily as needed (indigestion).      magnesium oxide (MAG-OX) 400 MG tablet Take 400 mg by mouth at bedtime.     memantine (NAMENDA) 5 MG tablet Take 5 mg by mouth daily.     metolazone (ZAROXOLYN) 2.5 MG tablet TAKE 1 TABLET BY MOUTH DAILY AS NEEDED (ONCE DAILY AS NEEDED FOR WEIGHT GAIN OR SWELLING.). 90 tablet 3   Multiple Vitamins-Minerals (OCUVITE ADULT 50+ PO) Take 1 tablet by mouth daily.     polyethylene glycol (MIRALAX / GLYCOLAX) packet Take 17 g by mouth daily as needed (constipation.).  potassium chloride (KLOR-CON) 10 MEQ tablet Take 30 mEq by mouth daily.     pramipexole (MIRAPEX) 1 MG tablet Take 1-2 mg by mouth 3 (three) times daily. 2 tablets QAM, 1 tablet at lunch, and 1 tablet at night     pregabalin (LYRICA) 25 MG capsule Take 25 mg by mouth every other day.     Red Yeast Rice 600 MG CAPS Take 600 mg by mouth daily.     sacubitril-valsartan (ENTRESTO) 24-26 MG TAKE 1 TABLET BY MOUTH TWICE A DAY 60 tablet 6   selegiline (ELDEPRYL) 5 MG capsule Take 5 mg by mouth 2 (two) times daily with a meal.      SENEXON-S 8.6-50 MG tablet Take 2 tablets by mouth 2 (two) times daily.     torsemide (DEMADEX) 20 MG tablet Take 20 mg by mouth. 20mg  daily alternating with 40mg  daily      traMADol (ULTRAM) 50 MG tablet      Vibegron (GEMTESA) 75 MG TABS Take 1 tablet (75 mg total) by mouth daily. 30 tablet 11   No current facility-administered medications for this visit.    Allergies:   Linaclotide, Codeine, Terazosin, Gabapentin, Hydrocodone-acetaminophen, Lubiprostone, Norco [hydrocodone-acetaminophen], Omeprazole, Oxycodone, and Ropinirole   Social History:  The patient  reports that he has never smoked. He has never been exposed to tobacco smoke. He has never used smokeless tobacco. He reports that he does not drink alcohol and does not use drugs.   Family History:   family history includes Colon cancer in his father; Heart disease in his father and mother; Stroke in his sister.    Review of Systems: Review of Systems  Constitutional: Negative.   HENT: Negative.    Respiratory:  Positive for shortness of breath.   Cardiovascular:  Positive for leg swelling.  Gastrointestinal: Negative.   Musculoskeletal: Negative.        Leg weakness  Neurological: Negative.   Psychiatric/Behavioral: Negative.    All other systems reviewed and are negative.   PHYSICAL EXAM: VS:  There were no vitals taken for this visit. , BMI There is no height or weight on file to calculate BMI. Constitutional:  oriented to person, place, and time. No distress.  HENT:  Head: Grossly normal Eyes:  no discharge. No scleral icterus.  Neck: No JVD, no carotid bruits  Cardiovascular: Regular rate and rhythm, no murmurs appreciated Pulmonary/Chest: Clear to auscultation bilaterally, no wheezes or rails Abdominal: Soft.  no distension.  no tenderness.  Musculoskeletal: Normal range of motion Neurological:  normal muscle tone. Coordination normal. No atrophy Skin: Skin warm and dry Psychiatric: normal affect, pleasant  Recent Labs: 11/04/2022: B Natriuretic Peptide 1,216.0 02/06/2023: Hemoglobin 15.1; Platelets 239 06/21/2023: BUN 44; Creatinine, Ser 1.96; Potassium 4.7; Sodium 142 09/05/2023:  ALT 22; TSH 1.540    Lipid Panel Lab Results  Component Value Date   CHOL 188 03/23/2016   HDL 46 03/23/2016   LDLCALC 114 (H) 03/23/2016   TRIG 138 03/23/2016      Wt Readings from Last 3 Encounters:  09/05/23 250 lb 9.6 oz (113.7 kg)  08/30/23 252 lb (114.3 kg)  05/30/23 249 lb (112.9 kg)     ASSESSMENT AND PLAN:  Essential hypertension -  Blood pressure is well controlled on today's visit. No changes made to the medications.  Paroxysmal atrial fibrillation [facility]) - cardioversion May 2020  off flecainide 100 twice daily  Remains on Coreg 3.125 twice daily, on amiodarone 100 twice daily CV  Atrial flutter cardioversion  for atrial for flutter Feb 08, 2023 Now on amiodarone with plans for cardioversion next week, has follow-up with the EP in August  Coronary artery disease involving native coronary artery of native heart without angina pectoris Currently with no symptoms of angina. No further workup at this time. Continue current medication regimen. .  Complete heart block (HCC) Pacemaker in place, followed by Dr. Graciela Husbands Rate in the 70s, ventricularly paced, wide-complex  Mixed hyperlipidemia Total cholesterol 169 LDL 103 A1c well-controlled 6.1  Constipation Likely related to his Parkinson's disease and dehydration, Stable on senna and Dulcolax  Chronic diastolic and systolic heart failure (HCC) Continue torsemide 60 alternating with 40 mg in the morning Metolazone 2.5 as needed reserved for worsening leg swelling weight gain abdominal distention Continue Entresto, Coreg  Parkinson's disease (HCC) Likely contributing to weakness, fatigue Followed by  Dr. Malvin Johns No regular activity at home or exercise program  Morbid obesity (HCC) Low carbohydrate recommended, exercise limited  Shortness of breath Exacerbated by flutter, paced rhythm, CHF symptoms, Parkinson's, deconditioning   Total encounter time more than 30 minutes  Greater than 50% was spent in  counseling and coordination of care with the patient   No orders of the defined types were placed in this encounter.     Signed, Dossie Arbour, M.D., Ph.D. 09/18/2023  [facility] [place], [place] [phone]

## 2023-09-18 NOTE — Telephone Encounter (Signed)
Courtney from Dr. Daisy Blossom office called to get Dr. Windell Hummingbird input on some medication for hullunations. Medication name is Risperdal 0.5 MG at night. Please call [phone]

## 2023-09-18 NOTE — Telephone Encounter (Signed)
Called to speak with Toni Amend at [facility]. She was not available so left detailed message with other staff that I am forwarding over to provider and that we will be in touch.

## 2023-09-19 ENCOUNTER — Encounter: Payer: Self-pay | Admitting: Cardiovascular Disease

## 2023-09-19 ENCOUNTER — Ambulatory Visit: Payer: Medicare Other | Attending: Cardiovascular Disease | Admitting: Cardiovascular Disease

## 2023-09-19 VITALS — BP 100/40 | HR 65 | Ht 72.0 in | Wt 250.4 lb

## 2023-09-19 DIAGNOSIS — I5022 Chronic systolic (congestive) heart failure: Secondary | ICD-10-CM

## 2023-09-19 DIAGNOSIS — N1832 Chronic kidney disease, stage 3b: Secondary | ICD-10-CM | POA: Diagnosis present

## 2023-09-19 DIAGNOSIS — Z950 Presence of cardiac pacemaker: Secondary | ICD-10-CM | POA: Diagnosis present

## 2023-09-19 DIAGNOSIS — I10 Essential (primary) hypertension: Secondary | ICD-10-CM

## 2023-09-19 DIAGNOSIS — I480 Paroxysmal atrial fibrillation: Secondary | ICD-10-CM

## 2023-09-19 DIAGNOSIS — I4892 Unspecified atrial flutter: Secondary | ICD-10-CM | POA: Diagnosis present

## 2023-09-19 DIAGNOSIS — I5032 Chronic diastolic (congestive) heart failure: Secondary | ICD-10-CM

## 2023-09-19 DIAGNOSIS — G20A1 Parkinson's disease without dyskinesia, without mention of fluctuations: Secondary | ICD-10-CM

## 2023-09-19 DIAGNOSIS — I442 Atrioventricular block, complete: Secondary | ICD-10-CM

## 2023-09-19 NOTE — Patient Instructions (Addendum)
Medication Instructions:  No changes  If you need a refill on your cardiac medications before your next appointment, please call your pharmacy.   Lab work: No new labs needed  Testing/Procedures: EKG 1-2 weeks after starting new medication.   Follow-Up: At [facility], you and your health needs are our priority.  As part of our continuing mission to provide you with exceptional heart care, we have created designated Provider Care Teams.  These Care Teams include your primary Cardiologist (physician) and Advanced Practice Providers (APPs -  Physician Assistants and Nurse Practitioners) who all work together to provide you with the care you need, when you need it.  You will need a follow up appointment in 6 months  Providers on your designated Care Team:   Nicolasa Ducking, NP Eula Listen, PA-C Cadence Fransico Michael, [place]  COVID-19 Vaccine Information can be found at: [email or web address] For questions related to vaccine distribution or appointments, please email [email or web address] or call [phone].

## 2023-09-19 NOTE — Telephone Encounter (Signed)
Spoke with nurse from Dr. Daisy Blossom office and made aware of Dr. Odessa Fleming recommendations.   Duke Salvia, MD  to Jani Gravel, RN     09/18/23  8:07 AM Please have the patient start, and tell them we will need to get ECG at the end of the  week after they start and after a week AFTER any dose adjustment

## 2023-10-02 ENCOUNTER — Ambulatory Visit: Payer: Medicare Other | Attending: Cardiovascular Disease

## 2023-10-02 VITALS — BP 120/60 | HR 60 | Ht 72.0 in | Wt 253.6 lb

## 2023-10-02 DIAGNOSIS — I25118 Atherosclerotic heart disease of native coronary artery with other forms of angina pectoris: Secondary | ICD-10-CM | POA: Insufficient documentation

## 2023-10-02 DIAGNOSIS — I442 Atrioventricular block, complete: Secondary | ICD-10-CM | POA: Diagnosis present

## 2023-10-02 DIAGNOSIS — I10 Essential (primary) hypertension: Secondary | ICD-10-CM | POA: Diagnosis not present

## 2023-10-02 NOTE — Progress Notes (Signed)
   Nurse Visit   Date of Encounter: 10/02/2023 ID: Marshon, Mcaneny 1946-04-09, MRN [id number]  PCP:  Mick Sell, MD   [facility] Providers Cardiologist:  Julien Nordmann, MD Cardiology APP:  Delma Freeze, FNP  Electrophysiologist:  Sherryl Manges, MD      Visit Details   VS:  BP 120/60 (BP Location: Left Arm, Patient Position: Sitting, Cuff Size: Large)   Pulse 60   Ht 6' (1.829 m)   Wt 253 lb 9.6 oz (115 kg)   SpO2 99%   BMI 34.39 kg/m  , BMI Body mass index is 34.39 kg/m.  Wt Readings from Last 3 Encounters:  10/02/23 253 lb 9.6 oz (115 kg)  09/19/23 250 lb 6 oz (113.6 kg)  09/05/23 250 lb 9.6 oz (113.7 kg)     Reason for visit: EKG for new med Performed today: Vitals, EKG, Provider consulted:Dr. Mariah Milling, and Education Changes (medications, testing, etc.) : None Length of Visit: 15 minutes    Medications Adjustments/Labs and Tests Ordered: Orders Placed This Encounter  Procedures   EKG 12-Lead   No orders of the defined types were placed in this encounter.    Signed, Lenor Derrick, RN  10/02/2023 10:51 AM

## 2023-10-07 ENCOUNTER — Other Ambulatory Visit: Payer: Self-pay | Admitting: Internal Medicine

## 2023-10-13 ENCOUNTER — Encounter: Payer: Self-pay | Admitting: Internal Medicine

## 2023-10-25 ENCOUNTER — Encounter: Payer: Self-pay | Admitting: Internal Medicine

## 2023-10-27 ENCOUNTER — Encounter: Payer: Self-pay | Admitting: Internal Medicine

## 2023-10-31 ENCOUNTER — Ambulatory Visit: Payer: Medicare Other | Attending: Internal Medicine

## 2023-10-31 DIAGNOSIS — I442 Atrioventricular block, complete: Secondary | ICD-10-CM | POA: Diagnosis not present

## 2023-10-31 LAB — CUP PACEART REMOTE DEVICE CHECK
Battery Remaining Longevity: 10 mo
Battery Remaining Percentage: 9 %
Battery Voltage: 2.81 V
Brady Statistic AP VP Percent: 99 %
Brady Statistic AP VS Percent: 1 %
Brady Statistic AS VP Percent: 1 %
Brady Statistic AS VS Percent: 1 %
Brady Statistic RA Percent Paced: 99 %
Brady Statistic RV Percent Paced: 99 %
Date Time Interrogation Session: 20250121044855
Implantable Lead Connection Status: 753985
Implantable Lead Connection Status: 753985
Implantable Lead Implant Date: 20160212
Implantable Lead Implant Date: 20160212
Implantable Lead Location: 753859
Implantable Lead Location: 753860
Implantable Lead Model: 1948
Implantable Pulse Generator Implant Date: 20160212
Lead Channel Impedance Value: 450 Ohm
Lead Channel Impedance Value: 580 Ohm
Lead Channel Pacing Threshold Amplitude: 0.625 V
Lead Channel Pacing Threshold Amplitude: 1.25 V
Lead Channel Pacing Threshold Pulse Width: 0.4 ms
Lead Channel Pacing Threshold Pulse Width: 0.4 ms
Lead Channel Sensing Intrinsic Amplitude: 12 mV
Lead Channel Sensing Intrinsic Amplitude: 4.6 mV
Lead Channel Setting Pacing Amplitude: 1.5 V
Lead Channel Setting Pacing Amplitude: 1.625
Lead Channel Setting Pacing Pulse Width: 0.4 ms
Lead Channel Setting Sensing Sensitivity: 4 mV
Pulse Gen Model: 2240
Pulse Gen Serial Number: 3049931

## 2023-12-06 ENCOUNTER — Encounter: Payer: Self-pay | Admitting: Internal Medicine

## 2023-12-07 ENCOUNTER — Ambulatory Visit: Payer: Medicare Other | Attending: Internal Medicine | Admitting: Internal Medicine

## 2023-12-07 VITALS — BP 110/60 | HR 62 | Ht 72.0 in | Wt 256.0 lb

## 2023-12-07 DIAGNOSIS — I442 Atrioventricular block, complete: Secondary | ICD-10-CM | POA: Insufficient documentation

## 2023-12-07 DIAGNOSIS — I10 Essential (primary) hypertension: Secondary | ICD-10-CM | POA: Insufficient documentation

## 2023-12-07 LAB — CUP PACEART INCLINIC DEVICE CHECK
Battery Remaining Longevity: 9 mo
Battery Voltage: 2.8 V
Brady Statistic RA Percent Paced: 99.91 %
Brady Statistic RV Percent Paced: 99.99 %
Date Time Interrogation Session: 20250227105454
Implantable Lead Connection Status: 753985
Implantable Lead Connection Status: 753985
Implantable Lead Implant Date: 20160212
Implantable Lead Implant Date: 20160212
Implantable Lead Location: 753859
Implantable Lead Location: 753860
Implantable Lead Model: 1948
Implantable Pulse Generator Implant Date: 20160212
Lead Channel Impedance Value: 450 Ohm
Lead Channel Impedance Value: 575 Ohm
Lead Channel Pacing Threshold Amplitude: 0.75 V
Lead Channel Pacing Threshold Amplitude: 0.75 V
Lead Channel Pacing Threshold Amplitude: 1.5 V
Lead Channel Pacing Threshold Amplitude: 1.5 V
Lead Channel Pacing Threshold Pulse Width: 0.4 ms
Lead Channel Pacing Threshold Pulse Width: 0.4 ms
Lead Channel Pacing Threshold Pulse Width: 0.4 ms
Lead Channel Pacing Threshold Pulse Width: 0.4 ms
Lead Channel Sensing Intrinsic Amplitude: 12 mV
Lead Channel Sensing Intrinsic Amplitude: 4.6 mV
Lead Channel Setting Pacing Amplitude: 1.625
Lead Channel Setting Pacing Amplitude: 1.625
Lead Channel Setting Pacing Pulse Width: 0.4 ms
Lead Channel Setting Sensing Sensitivity: 4 mV
Pulse Gen Model: 2240
Pulse Gen Serial Number: 3049931

## 2023-12-07 NOTE — Progress Notes (Signed)
 Patient Care Team: Mick Sell, MD as PCP - General (Infectious Diseases) Antonieta Iba, MD as PCP - Cardiology (Cardiology) Duke Salvia, MD as PCP - Electrophysiology (Cardiology) Delma Freeze, FNP as Nurse Practitioner (Cardiology) Antonieta Iba, MD as Consulting Physician (Cardiology) Morene Crocker, MD as Referring Physician (Neurology)   HPI  Matthew Howe is a 78 y.o. male Seen following pacemaker implantation Abbott 2/16 by Dr. Fawn Kirk For complete heart block.  History of atrial fibrillation on flecainide>>> stopped 2/24 in the setting of an admission for sepsis and respiratory failure.  He underwent TEE because of streptococcal pneumoniae bacteremia.? 2/2  cellulitis.  Concern for vegetation on the RA lead but a fibrionous strand with low specificity for vegetation.  Outpatient antibiotics were recommended, repeat cultures were negative.  Extraction was not pursued because of comorbidities  anticoagulation with Apixaban no  bleeding   DC cardioversion 5/24 successful unfortunately held for only about 3 days; it was the patient's impression as well as his wife's that he felt better.  Elected to begin amiodarone and repeat cardioversion 7/24  Neuropsychiatric drug recs complicated by concomitant use of amiodarone; TG and I have had different thresholds, and currently started on namenda   ; already on fluoxetine -- Hallucinations are [facility] better  The patient denies chest pain,, nocturnal dyspnea, orthopnea or peripheral edema.  There have been no palpitations, lightheadedness or syncope.  Complains of shortness of breath stable,  doing some riding on the stationary bike .   Patient denies symptoms of respiratory, GI intolerance, sun sensitivity, neurological symptoms attributable to amiodarone.      [name] DATE TEST EF   2/14 LHC  Non obstructive CAD  10/16 echo   55-60 %   3/18 Echo   55-60 %   6/20 Echo  55-60  Asc Ao 3.9 LVH mild LAE (-/53/38)   1/24 Echo  40-45%   10/24 Echo   50-55%       Date Cr K Hgb LFT TSH  12/20 1.53 4.4 13.8    6/21 1.6 4.4 14.6    6/22 1.7 4.6 14.2    3/24 1.63 4.0 14.6    6/24 1.4<<2.2 4.1  14   8/24 1.96 4.7   2.75  11/24 1.88 ( 1/25)  14.1 (1/25) 22 1.54          Past Medical History:  Diagnosis Date   Arthritis    Atrial fibrillation (HCC)    Carpal tunnel syndrome on both sides    CHF (congestive heart failure) (HCC)    CKD (chronic kidney disease) 10/2018   recent diagnosis   Complete heart block (HCC)    a. s/p St. Jude PPM 11/2014.   Complication of anesthesia    stayed overnight dt under too long d/t taking Parkinson's medication (2013?)   Coronary artery disease    GERD (gastroesophageal reflux disease)    "not as bad as it used to be"   Heart murmur    History of recent blood transfusion 07/2018   Hyperlipidemia    Hypertension    IBS (irritable bowel syndrome)    Lower extremity edema    Parkinson's disease (HCC)    Presence of permanent cardiac pacemaker    St Jude   Shortness of breath dyspnea    Stage 3b chronic kidney disease (CKD) (HCC)     Past Surgical History:  Procedure Laterality Date   APPENDECTOMY  12/2010   BACK  SURGERY  08/06/2018   lower lumbarsacral region, has rods in place. (done at [facility])   CARDIAC CATHETERIZATION  11/20/2012   Known CAD with one vessel coronary disease of left descending artery by cardiac cath.    CARDIOVERSION N/A 11/21/2016   Procedure: CARDIOVERSION;  Surgeon: Duke Salvia, MD;  Location: [facility] ORS;  Service: Cardiovascular;  Laterality: N/A;   CARDIOVERSION N/A 03/08/2019   Procedure: CARDIOVERSION;  Surgeon: Pricilla Riffle, MD;  Location: [facility] ENDOSCOPY;  Service: Cardiovascular;  Laterality: N/A;   CARDIOVERSION N/A 02/08/2023   Procedure: CARDIOVERSION;  Surgeon: Debbe Odea, MD;  Location: [facility] ORS;  Service: Cardiovascular;  Laterality: N/A;   CARDIOVERSION N/A 05/10/2023   Procedure: CARDIOVERSION;  Surgeon:  Antonieta Iba, MD;  Location: [facility] ORS;  Service: Cardiovascular;  Laterality: N/A;   CARPAL TUNNEL RELEASE Right 10/22/2018   Procedure: CARPAL TUNNEL RELEASE;  Surgeon: Donato Heinz, MD;  Location: [facility] ORS;  Service: Orthopedics;  Laterality: Right;   CARPAL TUNNEL RELEASE Left 05/22/2019   Procedure: CARPAL TUNNEL RELEASE - LEFT;  Surgeon: Donato Heinz, MD;  Location: [facility] ORS;  Service: Orthopedics;  Laterality: Left;   COLONOSCOPY  2018   COLONOSCOPY N/A 10/21/2020   Procedure: COLONOSCOPY;  Surgeon: Earline Mayotte, MD;  Location[facility] ENDOSCOPY;  Service: Endoscopy;  Laterality: N/A;   HERNIA REPAIR  10/23/2019   umbilical hernia repair   INSERT / REPLACE / REMOVE PACEMAKER     PACEMAKER INSERTION  11-21-14   STJ Assurity dual chamber pacemaker implanted by Dr Johney Frame for CHB   PERMANENT PACEMAKER INSERTION N/A 11/21/2014   Procedure: PERMANENT PACEMAKER INSERTION;  Surgeon: Hillis Range, MD;  Location: [facility] CATH LAB;  Service: Cardiovascular;  Laterality: N/A;   TEE WITHOUT CARDIOVERSION N/A 11/21/2016   Procedure: TRANSESOPHAGEAL ECHOCARDIOGRAM (TEE);  Surgeon: Duke Salvia, MD;  Location: [facility] ORS;  Service: Cardiovascular;  Laterality: N/A;   TEE WITHOUT CARDIOVERSION N/A 11/09/2022   Procedure: TRANSESOPHAGEAL ECHOCARDIOGRAM;  Surgeon: Debbe Odea, MD;  Location: [facility] ORS;  Service: Cardiovascular;  Laterality: N/A;   UPPER GI ENDOSCOPY      Current Outpatient Medications  Medication Sig Dispense Refill   acetaminophen (TYLENOL) 500 MG tablet Take 650 mg by mouth in the morning, at noon, and at bedtime.     allopurinol (ZYLOPRIM) 300 MG tablet Take 400 mg by mouth daily. 300 mg tablet + 100 mg tablet once daily  3   amiodarone (PACERONE) 200 MG tablet TAKE 1 TABLET BY MOUTH EVERY DAY 90 tablet 2   bisacodyl (DULCOLAX) 5 MG EC tablet Take 5 mg by mouth 2 (two) times daily.     carvedilol (COREG) 3.125 MG tablet Take 3.125 mg by mouth 2 (two) times daily.      cholecalciferol (VITAMIN D3) 25 MCG (1000 UNIT) tablet Take 2,000 Units by mouth daily.     Cyanocobalamin (VITAMIN B 12) 500 MCG TABS Take 1,000 mcg by mouth daily.     desloratadine (CLARINEX) 5 MG tablet Take 5 mg by mouth at bedtime.      ELIQUIS 5 MG TABS tablet TAKE 1 TABLET BY MOUTH TWICE A DAY 180 tablet 1   FLUoxetine (PROZAC) 10 MG capsule Take 30 mg by mouth daily.     fluticasone (FLONASE) 50 MCG/ACT nasal spray Place 2 sprays into the nose daily as needed for allergies.      lansoprazole (PREVACID) 15 MG capsule Take 15 mg by mouth daily as needed (indigestion).  magnesium oxide (MAG-OX) 400 MG tablet Take 400 mg by mouth at bedtime.     metolazone (ZAROXOLYN) 2.5 MG tablet TAKE 1 TABLET BY MOUTH DAILY AS NEEDED (ONCE DAILY AS NEEDED FOR WEIGHT GAIN OR SWELLING.). 90 tablet 3   Multiple Vitamins-Minerals (OCUVITE ADULT 50+ PO) Take 1 tablet by mouth daily.     polyethylene glycol (MIRALAX / GLYCOLAX) packet Take 17 g by mouth daily as needed (constipation.).      potassium chloride (KLOR-CON) 10 MEQ tablet Take 30 mEq by mouth daily.     pramipexole (MIRAPEX) 1 MG tablet Take 1-2 mg by mouth 3 (three) times daily. 2 tablets QAM, 1 tablet at lunch, and 1 tablet at night     pregabalin (LYRICA) 25 MG capsule Take 25 mg by mouth every other day.     Red Yeast Rice 600 MG CAPS Take 600 mg by mouth daily.     sacubitril-valsartan (ENTRESTO) 24-26 MG TAKE 1 TABLET BY MOUTH TWICE A DAY 60 tablet 6   selegiline (ELDEPRYL) 5 MG capsule Take 5 mg by mouth 2 (two) times daily with a meal.      SENEXON-S 8.6-50 MG tablet Take 2 tablets by mouth 2 (two) times daily.     torsemide (DEMADEX) 20 MG tablet Take 20 mg by mouth. 20mg  daily alternating with 40mg  daily     traMADol (ULTRAM) 50 MG tablet      Vibegron (GEMTESA) 75 MG TABS Take 1 tablet (75 mg total) by mouth daily. 30 tablet 11   No current facility-administered medications for this visit.    Allergies  Allergen Reactions    Linaclotide Shortness Of Breath and Other (See Comments)   Codeine Diarrhea, Nausea And Vomiting and Other (See Comments)   Terazosin Other (See Comments)    Dizziness / altered mental state  Dizziness / altered mental state  Dizziness / altered mental state    Gabapentin Other (See Comments)    dizziness Other reaction(s): Other (See Comments) dizziness   Hydrocodone-Acetaminophen Diarrhea, Nausea And Vomiting and Other (See Comments)   Lubiprostone Other (See Comments)    Abdominal cramps Other reaction(s): Other (See Comments) Abdominal cramps Abdominal cramps   Norco [Hydrocodone-Acetaminophen] Diarrhea and Nausea And Vomiting   Omeprazole Diarrhea, Nausea And Vomiting and Other (See Comments)    Stomach cramps   Oxycodone Nausea And Vomiting   Ropinirole Nausea Only and Other (See Comments)    Review of Systems negative except from HPI and PMH  Physical Exam BP 110/60 (BP Location: Left Arm, Patient Position: Sitting, Cuff Size: Normal)   Pulse 62   Ht 6' (1.829 m)   Wt 256 lb (116.1 kg)   BMI 34.72 kg/m  Well developed and well nourished in no acute distress HENT normal Neck supple with JVP-flat Clear kyohotic Device pocket well healed; without hematoma or erythema.  There is no tethering  Regular rate and rhythm, no  gallop No  murmur Abd-soft with active BS No Clubbing cyanosis tr edema Skin-warm and dry A & Oriented  Grossly normal sensory and motor function  ECG AV pacing @  62 19/20/55 unchanged  Device function is normal. Programming changes none  See Paceart for details -    Assessment and  Plan  Complete heart block  Sinus arrest  Atrial fibrillation-persistent  Pacemaker dual-chamber-St. Jude    Broad QRS  Parkinson's disease  Renal insuff gd 3  HFpEF  chronic class III   No atrial fibrillation  continue amiodarone--tolerating;  no  bleeding on Apixaban   need to check CBC (actually found in (CE)   Hallucinations are much improved  on the new psychotropics; QT within range ( Need to substract the 100 sec from the QT measured 2/2 very long QRSd)   Euvolemic Continue  entresto and carvedilol; spiro precluded by BP and concern about SGLT 2 2/2 habitus and low renal function

## 2023-12-07 NOTE — Patient Instructions (Addendum)
 Medication Instructions:  The current medical regimen is effective;  continue present plan and medications.  *If you need a refill on your cardiac medications before your next appointment, please call your pharmacy*   Lab Work: Your provider would like for you to have following labs drawn today hgb.   If you have labs (blood work) drawn today and your tests are completely normal, you will receive your results only by: MyChart Message (if you have MyChart) OR A paper copy in the mail If you have any lab test that is abnormal or we need to change your treatment, we will call you to review the results.   Follow-Up: At [facility], you and your health needs are our priority.  As part of our continuing mission to provide you with exceptional heart care, we have created designated Provider Care Teams.  These Care Teams include your primary Cardiologist (physician) and Advanced Practice Providers (APPs -  Physician Assistants and Nurse Practitioners) who all work together to provide you with the care you need, when you need it.  We recommend signing up for the patient portal called "MyChart".  Sign up information is provided on this After Visit Summary.  MyChart is used to connect with patients for Virtual Visits (Telemedicine).  Patients are able to view lab/test results, encounter notes, upcoming appointments, etc.  Non-urgent messages can be sent to your provider as well.   To learn more about what you can do with MyChart, go to [email or web address].    Your next appointment:   6 month(s)  Provider:   Sherryl Manges, MD

## 2023-12-08 LAB — HEMOGLOBIN: Hemoglobin: 13.3 g/dL (ref 13.0–17.7)

## 2023-12-09 ENCOUNTER — Encounter: Payer: Self-pay | Admitting: Cardiovascular Disease

## 2023-12-11 ENCOUNTER — Telehealth: Payer: Self-pay | Admitting: Pharmacy Technician

## 2023-12-11 ENCOUNTER — Other Ambulatory Visit (HOSPITAL_COMMUNITY): Payer: Self-pay

## 2023-12-11 NOTE — Telephone Encounter (Signed)
 Pharmacy Patient Advocate Encounter  Received notification from [facility] that Prior Authorization for Entresto 24-26MG  tablets has been APPROVED from 12/10/23 to 12/09/24. Ran test claim, Copay is $47.00. This test claim was processed through [facility]- copay amounts may vary at other pharmacies due to pharmacy/plan contracts, or as the patient moves through the different stages of their insurance plan.   PA #/Case ID/Reference #: [id number]

## 2023-12-11 NOTE — Telephone Encounter (Signed)
 Reviewed all information with patients wife and she verbalized understanding and will call back if any further needs.

## 2023-12-11 NOTE — Telephone Encounter (Signed)
 Pharmacy Patient Advocate Encounter   Received notification from Patient Advice Request messages that prior authorization for Entresto is required/requested.   Insurance verification completed.   The patient is insured through [facility] ADVANTAGE/RX ADVANCE .   Per test claim: PA required; PA submitted to above mentioned insurance via CoverMyMeds Key/confirmation #/EOC [facility] Status is pending

## 2023-12-12 NOTE — Progress Notes (Signed)
 Remote pacemaker transmission.

## 2023-12-13 ENCOUNTER — Ambulatory Visit
Admission: RE | Admit: 2023-12-13 | Discharge: 2023-12-13 | Disposition: A | Discharge status: Home / Self Care | Payer: Medicare Other | Attending: Internal Medicine | Admitting: Internal Medicine

## 2023-12-13 ENCOUNTER — Encounter: Payer: Self-pay | Admitting: Internal Medicine

## 2023-12-13 ENCOUNTER — Ambulatory Visit: Payer: Self-pay | Admitting: Anesthesiology

## 2023-12-13 ENCOUNTER — Encounter
Admission: RE | Disposition: A | Discharge status: Home / Self Care | Payer: Self-pay | Source: Home / Self Care | Attending: Internal Medicine

## 2023-12-13 ENCOUNTER — Other Ambulatory Visit: Payer: Self-pay

## 2023-12-13 DIAGNOSIS — Z5309 Procedure and treatment not carried out because of other contraindication: Secondary | ICD-10-CM | POA: Diagnosis not present

## 2023-12-13 DIAGNOSIS — Z1211 Encounter for screening for malignant neoplasm of colon: Secondary | ICD-10-CM | POA: Insufficient documentation

## 2023-12-13 DIAGNOSIS — K5730 Diverticulosis of large intestine without perforation or abscess without bleeding: Secondary | ICD-10-CM | POA: Insufficient documentation

## 2023-12-13 DIAGNOSIS — I2510 Atherosclerotic heart disease of native coronary artery without angina pectoris: Secondary | ICD-10-CM | POA: Insufficient documentation

## 2023-12-13 DIAGNOSIS — M1990 Unspecified osteoarthritis, unspecified site: Secondary | ICD-10-CM | POA: Insufficient documentation

## 2023-12-13 DIAGNOSIS — Z7901 Long term (current) use of anticoagulants: Secondary | ICD-10-CM | POA: Diagnosis not present

## 2023-12-13 DIAGNOSIS — N1832 Chronic kidney disease, stage 3b: Secondary | ICD-10-CM | POA: Insufficient documentation

## 2023-12-13 DIAGNOSIS — Z950 Presence of cardiac pacemaker: Secondary | ICD-10-CM | POA: Insufficient documentation

## 2023-12-13 DIAGNOSIS — G20A1 Parkinson's disease without dyskinesia, without mention of fluctuations: Secondary | ICD-10-CM | POA: Insufficient documentation

## 2023-12-13 DIAGNOSIS — I130 Hypertensive heart and chronic kidney disease with heart failure and stage 1 through stage 4 chronic kidney disease, or unspecified chronic kidney disease: Secondary | ICD-10-CM | POA: Insufficient documentation

## 2023-12-13 DIAGNOSIS — Z79899 Other long term (current) drug therapy: Secondary | ICD-10-CM | POA: Diagnosis not present

## 2023-12-13 DIAGNOSIS — K219 Gastro-esophageal reflux disease without esophagitis: Secondary | ICD-10-CM | POA: Diagnosis not present

## 2023-12-13 DIAGNOSIS — I509 Heart failure, unspecified: Secondary | ICD-10-CM | POA: Diagnosis not present

## 2023-12-13 HISTORY — DX: Chronic kidney disease, stage 3b: N18.32

## 2023-12-13 HISTORY — PX: COLONOSCOPY WITH PROPOFOL: SHX5780

## 2023-12-13 SURGERY — COLONOSCOPY WITH PROPOFOL
Anesthesia: General

## 2023-12-13 MED ORDER — SODIUM CHLORIDE 0.9 % IV SOLN: INTRAVENOUS | Status: DC

## 2023-12-13 MED ORDER — PHENYLEPHRINE 80 MCG/ML (10ML) SYRINGE FOR IV PUSH (FOR BLOOD PRESSURE SUPPORT)
PREFILLED_SYRINGE | INTRAVENOUS | Status: DC | PRN
  Administered 2023-12-13 (×2): 80 ug via INTRAVENOUS

## 2023-12-13 MED ORDER — LIDOCAINE HCL (CARDIAC) PF 100 MG/5ML IV SOSY
PREFILLED_SYRINGE | INTRAVENOUS | Status: DC | PRN
  Administered 2023-12-13: 50 mg via INTRAVENOUS

## 2023-12-13 MED ORDER — PROPOFOL 500 MG/50ML IV EMUL
INTRAVENOUS | Status: DC | PRN
  Administered 2023-12-13: 40 mg via INTRAVENOUS
  Administered 2023-12-13: 100 ug/kg/min via INTRAVENOUS

## 2023-12-13 NOTE — Interval H&P Note (Signed)
 History and Physical Interval Note:  12/13/2023 1:11 PM  Matthew Howe  has presented today for surgery, with the diagnosis of V12.72 (ICD-9-CM) - Z86.0101 (ICD-10-CM) - Hx of adenomatous colonic polyps.  The various methods of treatment have been discussed with the patient and family. After consideration of risks, benefits and other options for treatment, the patient has consented to  Procedure(s): COLONOSCOPY WITH PROPOFOL (N/A) as a surgical intervention.  The patient's history has been reviewed, patient examined, no change in status, stable for surgery.  I have reviewed the patient's chart and labs.  Questions were answered to the patient's satisfaction.     [place], [place]

## 2023-12-13 NOTE — Anesthesia Preprocedure Evaluation (Addendum)
 Anesthesia Evaluation  Patient identified by MRN, date of birth, ID band Patient awake    Reviewed: Allergy & Precautions, NPO status , Patient's Chart, lab work & pertinent test results  History of Anesthesia Complications (+) history of anesthetic complications  Airway Mallampati: III  TM Distance: <3 FB Neck ROM: full    Dental  (+) Chipped, Poor Dentition, Missing   Pulmonary neg pulmonary ROS, neg shortness of breath   Pulmonary exam normal        Cardiovascular Exercise Tolerance: Poor hypertension, (-) angina + CAD and +CHF  Normal cardiovascular exam+ dysrhythmias + pacemaker      Neuro/Psych  Neuromuscular disease  negative psych ROS   GI/Hepatic Neg liver ROS,GERD  Controlled,,  Endo/Other  negative endocrine ROS    Renal/GU Renal disease  negative genitourinary   Musculoskeletal  (+) Arthritis ,    Abdominal   Peds  Hematology negative hematology ROS (+)   Anesthesia Other Findings Past Medical History: No date: Arthritis No date: Atrial fibrillation (HCC) No date: Carpal tunnel syndrome on both sides No date: CHF (congestive heart failure) (HCC) 10/2018: CKD (chronic kidney disease)     Comment:  recent diagnosis No date: Complete heart block (HCC)     Comment:  a. s/p St. Jude PPM 11/2014. No date: Complication of anesthesia     Comment:  stayed overnight dt under too long d/t taking               Parkinson's medication (2013?) No date: Coronary artery disease No date: GERD (gastroesophageal reflux disease)     Comment:  "not as bad as it used to be" No date: Heart murmur 07/2018: History of recent blood transfusion No date: Hyperlipidemia No date: Hypertension No date: IBS (irritable bowel syndrome) No date: Lower extremity edema No date: Parkinson's disease (HCC) No date: Presence of permanent cardiac pacemaker     Comment:  St Jude No date: Shortness of breath dyspnea No date: Stage  3b chronic kidney disease (CKD) (HCC)  Past Surgical History: 12/2010: APPENDECTOMY 08/06/2018: BACK SURGERY     Comment:  lower lumbarsacral region, has rods in place. (done at               [facility]) 11/20/2012: CARDIAC CATHETERIZATION     Comment:  Known CAD with one vessel coronary disease of left               descending artery by cardiac cath.  11/21/2016: CARDIOVERSION; N/A     Comment:  Procedure: CARDIOVERSION;  Surgeon: Duke Salvia, MD;               Location: [facility] ORS;  Service: Cardiovascular;                Laterality: N/A; 03/08/2019: CARDIOVERSION; N/A     Comment:  Procedure: CARDIOVERSION;  Surgeon: Pricilla Riffle, MD;                Location: [facility] ENDOSCOPY;  Service: Cardiovascular;                Laterality: N/A; 02/08/2023: CARDIOVERSION; N/A     Comment:  Procedure: CARDIOVERSION;  Surgeon: Debbe Odea,               MD;  Location: [facility] ORS;  Service: Cardiovascular;                Laterality: N/A; 05/10/2023: CARDIOVERSION; N/A     Comment:  Procedure: CARDIOVERSION;  Surgeon:  Antonieta Iba,               MD;  Location: [facility] ORS;  Service: Cardiovascular;                Laterality: N/A; 10/22/2018: CARPAL TUNNEL RELEASE; Right     Comment:  Procedure: CARPAL TUNNEL RELEASE;  Surgeon: Donato Heinz, MD;  Location: [facility] ORS;  Service: Orthopedics;               Laterality: Right; 05/22/2019: CARPAL TUNNEL RELEASE; Left     Comment:  Procedure: CARPAL TUNNEL RELEASE - LEFT;  Surgeon:               Donato Heinz, MD;  Location: [facility] ORS;  Service:               Orthopedics;  Laterality: Left; 2018: COLONOSCOPY 10/21/2020: COLONOSCOPY; N/A     Comment:  Procedure: COLONOSCOPY;  Surgeon: Earline Mayotte,               MD;  Location: [facility] ENDOSCOPY;  Service: Endoscopy;                Laterality: N/A; 10/23/2019: HERNIA REPAIR     Comment:  umbilical hernia repair No date: INSERT / REPLACE / REMOVE PACEMAKER 11-21-14: PACEMAKER  INSERTION     Comment:  STJ Assurity dual chamber pacemaker implanted by Dr               Johney Frame for CHB 11/21/2014: PERMANENT PACEMAKER INSERTION; N/A     Comment:  Procedure: PERMANENT PACEMAKER INSERTION;  Surgeon:               Hillis Range, MD;  Location: [facility] CATH LAB;  Service:               Cardiovascular;  Laterality: N/A; 11/21/2016: TEE WITHOUT CARDIOVERSION; N/A     Comment:  Procedure: TRANSESOPHAGEAL ECHOCARDIOGRAM (TEE);                Surgeon: Duke Salvia, MD;  Location: [facility] ORS;                Service: Cardiovascular;  Laterality: N/A; 11/09/2022: TEE WITHOUT CARDIOVERSION; N/A     Comment:  Procedure: TRANSESOPHAGEAL ECHOCARDIOGRAM;  Surgeon:               Debbe Odea, MD;  Location: [facility] ORS;  Service:               Cardiovascular;  Laterality: N/A; No date: UPPER GI ENDOSCOPY     Reproductive/Obstetrics negative OB ROS                             Anesthesia Physical Anesthesia Plan  ASA: 3  Anesthesia Plan: General   Post-op Pain Management:    Induction: Intravenous  PONV Risk Score and Plan: Propofol infusion and TIVA  Airway Management Planned: Natural Airway and Nasal Cannula  Additional Equipment:   Intra-op Plan:   Post-operative Plan:   Informed Consent: I have reviewed the patients History and Physical, chart, labs and discussed the procedure including the risks, benefits and alternatives for the proposed anesthesia with the patient or authorized representative who has indicated his/her understanding and acceptance.     Dental Advisory Given  Plan Discussed with: Anesthesiologist, CRNA and Surgeon  Anesthesia Plan Comments: (  Patient and wife consented for risks of anesthesia including but not limited to:  - adverse reactions to medications - risk of airway placement if required - damage to eyes, teeth, lips or other oral mucosa - nerve damage due to positioning  - sore throat or hoarseness - Damage to  heart, brain, nerves, lungs, other parts of body or loss of life  They voiced understanding and assent.)       Anesthesia Quick Evaluation

## 2023-12-13 NOTE — H&P (Signed)
 Outpatient short stay form Pre-procedure 12/13/2023 1:09 PM Matthew Howe K. Matthew Howe, M.D.  Primary Physician: Matthew Howe, M.D.  Reason for visit:  History of adenomatous colon polyps  History of present illness:  12/29/14: CT A/P w/ contrast (indic: lower abd pain, constipation) - colonic stool burden suggestive of constipation. No other intra-abdominal pathologies. -- 11/30/16: CSY - 18 mm TA in ascending colon. 2 5-6 mm SSA in ascending colon. 4 mm hyperplastic polyp in transverse colon. Diverticulosis. -- 10/21/20: CSY - three 4-8 mm TAs in ascending and mid-ascending colon, one 8 mm TA at hepatic flexure, one 8 mm TA in distal transverse colon, sigmoid diverticulosis. Repeat 3-years.   GI medications: -- Current: Senokot 8.6-50 mg 2 tablets BID + Dulcolax 5 mg BID. Lansoprazole 15 mg prn. -- Prior: Citrucel (ineffective). Lactulose daily (ineffective). Linactolide x6 months (helpful, but caused nausea or shortness of breath and became ineffective). Lubiprostone (intolerant). Dicyclomine (worsened constipation).  Interval History   Matthew Howe presents to the [facility] for annual follow-up of chronic constipation and high-risk colon cancer surveillance 2/2 personal hx of adenomatous colon polyps. He presents to the clinic with his wife. He denies any acute GI complaints at this time. He has not been taking his stool softener and stimulant laxative combination the past couple days because stools have been looser. He is generally having 1 BM a day without any gross hematochezia or melena. He is eating 2-3 meals daily. He does enjoy cranberry or apple juice with breakfast. He eats raisin bran every morning. He denies any unexplained abdominal pain or abdominal cramping. Appetite and diet are stable without any unintentional weight loss. He denies any UGI symptoms such as nausea, vomiting, esophageal dysphagia, odynophagia, early satiety, hoarseness, or epigastric abdominal pain. He has had a  difficult year. He was hospitalized with sepsis and respiratory failure 2/2 pneumonia back in February 2024. He is s/p cardioversion 02/2023 and 04/2023. He continues to follow closely with Dr. Mariah Howe and Dr. Graciela Howe in Cardiology.     Current Facility-Administered Medications:    0.9 %  sodium chloride infusion, , Intravenous, Continuous, Matthew Howe, Matthew Nearing, MD, Last Rate: 20 mL/hr at 12/13/23 1248, New Bag at 12/13/23 1248  Medications Prior to Admission  Medication Sig Dispense Refill Last Dose/Taking   acetaminophen (TYLENOL) 500 MG tablet Take 650 mg by mouth in the morning, at noon, and at bedtime.   12/13/2023 at  9:30 AM   allopurinol (ZYLOPRIM) 300 MG tablet Take 400 mg by mouth daily. 300 mg tablet + 100 mg tablet once daily  3 12/13/2023 at  9:30 AM   amiodarone (PACERONE) 200 MG tablet TAKE 1 TABLET BY MOUTH EVERY DAY 90 tablet 2 12/13/2023 at  9:30 AM   bisacodyl (DULCOLAX) 5 MG EC tablet Take 5 mg by mouth 2 (two) times daily.   Past Week   carvedilol (COREG) 3.125 MG tablet Take 3.125 mg by mouth 2 (two) times daily.   12/13/2023 at  9:30 AM   cholecalciferol (VITAMIN D3) 25 MCG (1000 UNIT) tablet Take 2,000 Units by mouth daily.   Past Week   Cyanocobalamin (VITAMIN B 12) 500 MCG TABS Take 1,000 mcg by mouth daily.   Past Week   desloratadine (CLARINEX) 5 MG tablet Take 5 mg by mouth at bedtime.    12/12/2023   ELIQUIS 5 MG TABS tablet TAKE 1 TABLET BY MOUTH TWICE A DAY 180 tablet 1 12/10/2023   FLUoxetine (PROZAC) 10 MG capsule Take 30 mg by mouth daily.  12/13/2023 at  9:30 AM   fluticasone (FLONASE) 50 MCG/ACT nasal spray Place 2 sprays into the nose daily as needed for allergies.    12/12/2023   lansoprazole (PREVACID) 15 MG capsule Take 15 mg by mouth daily as needed (indigestion).    12/12/2023   magnesium oxide (MAG-OX) 400 MG tablet Take 400 mg by mouth at bedtime.   12/12/2023   metolazone (ZAROXOLYN) 2.5 MG tablet TAKE 1 TABLET BY MOUTH DAILY AS NEEDED (ONCE DAILY AS NEEDED FOR WEIGHT GAIN OR  SWELLING.). 90 tablet 3 Past Month   Multiple Vitamins-Minerals (OCUVITE ADULT 50+ PO) Take 1 tablet by mouth daily.   Past Week   polyethylene glycol (MIRALAX / GLYCOLAX) packet Take 17 g by mouth daily as needed (constipation.).    Past Week   potassium chloride (KLOR-CON) 10 MEQ tablet Take 30 mEq by mouth daily.   Past Week   pramipexole (MIRAPEX) 1 MG tablet Take 1-2 mg by mouth 3 (three) times daily. 2 tablets QAM, 1 tablet at lunch, and 1 tablet at night   12/13/2023 at  9:30 AM   Red Yeast Rice 600 MG CAPS Take 600 mg by mouth daily.   12/12/2023   risperiDONE microspheres (RISPERDAL CONSTA) 25 MG injection Inject 25 mg into the muscle every 14 (fourteen) days.   12/12/2023   sacubitril-valsartan (ENTRESTO) 24-26 MG TAKE 1 TABLET BY MOUTH TWICE A DAY 60 tablet 6 12/12/2023   selegiline (ELDEPRYL) 5 MG capsule Take 5 mg by mouth 2 (two) times daily with a meal.    12/13/2023 at  9:30 AM   SENEXON-S 8.6-50 MG tablet Take 2 tablets by mouth 2 (two) times daily.   12/12/2023   torsemide (DEMADEX) 20 MG tablet Take 20 mg by mouth. 20mg  daily alternating with 40mg  daily   12/12/2023   traMADol (ULTRAM) 50 MG tablet    12/12/2023   pregabalin (LYRICA) 25 MG capsule Take 25 mg by mouth every other day.      Vibegron (GEMTESA) 75 MG TABS Take 1 tablet (75 mg total) by mouth daily. 30 tablet 11      Allergies  Allergen Reactions   Linaclotide Shortness Of Breath and Other (See Comments)   Codeine Diarrhea, Nausea And Vomiting and Other (See Comments)   Terazosin Other (See Comments)    Dizziness / altered mental state  Dizziness / altered mental state  Dizziness / altered mental state    Gabapentin Other (See Comments)    dizziness Other reaction(s): Other (See Comments) dizziness   Hydrocodone-Acetaminophen Diarrhea, Nausea And Vomiting and Other (See Comments)   Lubiprostone Other (See Comments)    Abdominal cramps Other reaction(s): Other (See Comments) Abdominal cramps Abdominal cramps   Norco  [Hydrocodone-Acetaminophen] Diarrhea and Nausea And Vomiting   Omeprazole Diarrhea, Nausea And Vomiting and Other (See Comments)    Stomach cramps   Oxycodone Nausea And Vomiting   Ropinirole Nausea Only and Other (See Comments)     Past Medical History:  Diagnosis Date   Arthritis    Atrial fibrillation (HCC)    Carpal tunnel syndrome on both sides    CHF (congestive heart failure) (HCC)    CKD (chronic kidney disease) 10/2018   recent diagnosis   Complete heart block (HCC)    a. s/p St. Jude PPM 11/2014.   Complication of anesthesia    stayed overnight dt under too long d/t taking Parkinson's medication (2013?)   Coronary artery disease    GERD (gastroesophageal reflux disease)    "  not as bad as it used to be"   Heart murmur    History of recent blood transfusion 07/2018   Hyperlipidemia    Hypertension    IBS (irritable bowel syndrome)    Lower extremity edema    Parkinson's disease (HCC)    Presence of permanent cardiac pacemaker    St Jude   Shortness of breath dyspnea    Stage 3b chronic kidney disease (CKD) (HCC)     Review of systems:  Otherwise negative.    Physical Exam  Gen: Alert, oriented. Appears stated age.  HEENT: [place]/AT. PERRLA. Lungs: CTA, no wheezes. CV: RR nl S1, S2. Abd: soft, benign, no masses. BS+ Ext: No edema. Pulses 2+    Planned procedures: Proceed with colonoscopy. The patient understands the nature of the planned procedure, indications, risks, alternatives and potential complications including but not limited to bleeding, infection, perforation, damage to internal organs and possible oversedation/side effects from anesthesia. The patient agrees and gives consent to proceed.  Please refer to procedure notes for findings, recommendations and patient disposition/instructions.     Matthew Howe K. Matthew Howe, M.D. Gastroenterology 12/13/2023  1:09 PM

## 2023-12-13 NOTE — Op Note (Signed)
 [facility] Gastroenterology Patient Name: Matthew Howe Procedure Date: 12/13/2023 1:41 PM MRN: [id number] Account #: [id number] Date of Birth: March 15, 1946 Admit Type: Outpatient Age: 78 Room: [facility] ENDO ROOM 2 Gender: Male Note Status: Finalized Instrument Name: Nelda Marseille [id number] Procedure:             Colonoscopy Indications:           High risk colon cancer surveillance: Personal history                         of multiple (3 or more) adenomas Providers:             Boykin Nearing. Norma Fredrickson MD, MD Referring MD:          Clydie Braun (Referring MD) Medicines:             Propofol per Anesthesia Complications:         No immediate complications. Estimated blood loss: None. Procedure:             Pre-Anesthesia Assessment:                        - The risks and benefits of the procedure and the                         sedation options and risks were discussed with the                         patient. All questions were answered and informed                         consent was obtained.                        - Patient identification and proposed procedure were                         verified prior to the procedure by the nurse. The                         procedure was verified in the procedure room.                        - ASA Grade Assessment: III - A patient with severe                         systemic disease.                        - After reviewing the risks and benefits, the patient                         was deemed in satisfactory condition to undergo the                         procedure.                        After obtaining informed consent, the colonoscope was  passed under direct vision. Throughout the procedure,                         the patient's blood pressure, pulse, and oxygen                         saturations were monitored continuously. The                         Colonoscope was introduced through the anus with the                          intention of advancing to the cecum. The scope was                         advanced to the transverse colon before the procedure                         was aborted. Medications were given. The colonoscopy                         was technically difficult and complex due to poor                         bowel prep. The patient tolerated the procedure well.                         The quality of the bowel preparation was poor. The                         colonoscopy was aborted due to inadequate bowel prep.                         Lavage did not allow for the successful completion of                         the procedure. Findings:      The perianal and digital rectal examinations were normal. Pertinent       negatives include normal sphincter tone and no palpable rectal lesions.      Many small-mouthed diverticula were found in the sigmoid colon.      Copious quantities of semi-liquid stool was found in the rectum, in the       recto-sigmoid colon, in the sigmoid colon, in the descending colon and       in the transverse colon, interfering with visualization. Lavage of the       area was performed using a moderate amount of sterile water, resulting       in incomplete clearance with continued poor visualization. Estimated       blood loss: none.      No additional abnormalities were found on retroflexion. Impression:            - Preparation of the colon was poor.                        - The procedure was aborted due to inadequate bowel  prep.                        - Diverticulosis in the sigmoid colon.                        - Stool in the rectum, in the recto-sigmoid colon, in                         the sigmoid colon, in the descending colon and in the                         transverse colon.                        - No specimens collected. Recommendation:        - Patient has a contact number available for                          emergencies. The signs and symptoms of potential                         delayed complications were discussed with the patient.                         Return to normal activities tomorrow. Written                         discharge instructions were provided to the patient.                        - Resume previous diet.                        - Continue present medications.                        - Reschedule colonoscopy in 2-3 months with daily                         Miralax (17g) for 2 weeks prior to the procedure as                         well as Trilyte bowel prep the day before the                         colonoscopy.                        - Follow up with Jacob Moores, PA-C in the GI office.                         [phone]                        - The findings and recommendations were discussed with                         the patient. Procedure Code(s):     --- Professional ---  G0105, 53, Colorectal cancer screening; colonoscopy on                         individual at high risk Diagnosis Code(s):     --- Professional ---                        K57.30, Diverticulosis of large intestine without                         perforation or abscess without bleeding                        Z86.010, Personal history of colonic polyps CPT copyright 2022 [employer]. All rights reserved. The codes documented in this report are preliminary and upon coder review may  be revised to meet current compliance requirements. Stanton Kidney MD, MD 12/13/2023 2:11:21 PM This report has been signed electronically. Number of Addenda: 0 Note Initiated On: 12/13/2023 1:41 PM Total Procedure Duration: 0 hours 9 minutes 29 seconds  Estimated Blood Loss:  Estimated blood loss: none.      [facility]

## 2023-12-13 NOTE — Transfer of Care (Signed)
 Immediate Anesthesia Transfer of Care Note  Patient: Matthew Howe  Procedure(s) Performed: COLONOSCOPY WITH PROPOFOL  Patient Location: PACU  Anesthesia Type:MAC  Level of Consciousness: sedated  Airway & Oxygen Therapy: Patient Spontanous Breathing  Post-op Assessment: Report given to RN and Post -op Vital signs reviewed and stable  Post vital signs: stable  Last Vitals:  Vitals Value Taken Time  BP 105/42 12/13/23 1413  Temp    Pulse 67 12/13/23 1415  Resp 11 12/13/23 1415  SpO2 99 % 12/13/23 1415  Vitals shown include unfiled device data.  Last Pain:  Vitals:   12/13/23 1225  TempSrc: Temporal  PainSc: 0-No pain         Complications: No notable events documented.

## 2023-12-13 NOTE — Anesthesia Postprocedure Evaluation (Signed)
 Anesthesia Post Note  Patient: Matthew Howe  Procedure(s) Performed: COLONOSCOPY WITH PROPOFOL  Patient location during evaluation: PACU Anesthesia Type: General Level of consciousness: awake and alert Pain management: pain level controlled Vital Signs Assessment: post-procedure vital signs reviewed and stable Respiratory status: spontaneous breathing, nonlabored ventilation and respiratory function stable Cardiovascular status: blood pressure returned to baseline and stable Postop Assessment: no apparent nausea or vomiting Anesthetic complications: no   No notable events documented.   Last Vitals:  Vitals:   12/13/23 1430 12/13/23 1440  BP: (!) 112/46 (!) 113/52  Pulse: 60   Resp: 16   Temp:    SpO2: 100%     Last Pain:  Vitals:   12/13/23 1430  TempSrc:   PainSc: 0-No pain                 Foye Deer

## 2023-12-14 ENCOUNTER — Encounter: Payer: Self-pay | Admitting: Internal Medicine

## 2023-12-26 ENCOUNTER — Encounter: Payer: Self-pay | Admitting: Internal Medicine

## 2024-01-16 ENCOUNTER — Telehealth: Payer: Self-pay

## 2024-01-16 NOTE — Telephone Encounter (Signed)
 Incoming approval of PA for Gemtesa  Dates: 01/15/2024 - 01/14/2026

## 2024-01-28 ENCOUNTER — Other Ambulatory Visit: Payer: Self-pay | Admitting: Cardiovascular Disease

## 2024-01-29 NOTE — Telephone Encounter (Signed)
 Prescription refill request for Eliquis  received. Indication:afib Last office visit:2/25 Scr:1.7  3/25 Age: 78 Weight:115.2  kg  Prescription refilled

## 2024-01-30 ENCOUNTER — Ambulatory Visit: Payer: Medicare Other

## 2024-01-30 DIAGNOSIS — I442 Atrioventricular block, complete: Secondary | ICD-10-CM | POA: Diagnosis not present

## 2024-01-31 LAB — CUP PACEART REMOTE DEVICE CHECK
Battery Remaining Longevity: 6 mo
Battery Remaining Percentage: 5 %
Battery Voltage: 2.75 V
Brady Statistic AP VP Percent: 99 %
Brady Statistic AP VS Percent: 1 %
Brady Statistic AS VP Percent: 1 %
Brady Statistic AS VS Percent: 0 %
Brady Statistic RA Percent Paced: 99 %
Brady Statistic RV Percent Paced: 99 %
Date Time Interrogation Session: 20250422020015
Implantable Lead Connection Status: 753985
Implantable Lead Connection Status: 753985
Implantable Lead Implant Date: 20160212
Implantable Lead Implant Date: 20160212
Implantable Lead Location: 753859
Implantable Lead Location: 753860
Implantable Lead Model: 1948
Implantable Pulse Generator Implant Date: 20160212
Lead Channel Impedance Value: 450 Ohm
Lead Channel Impedance Value: 580 Ohm
Lead Channel Pacing Threshold Amplitude: 0.625 V
Lead Channel Pacing Threshold Amplitude: 1.375 V
Lead Channel Pacing Threshold Pulse Width: 0.4 ms
Lead Channel Pacing Threshold Pulse Width: 0.4 ms
Lead Channel Sensing Intrinsic Amplitude: 12 mV
Lead Channel Sensing Intrinsic Amplitude: 4.6 mV
Lead Channel Setting Pacing Amplitude: 1.625
Lead Channel Setting Pacing Amplitude: 1.625
Lead Channel Setting Pacing Pulse Width: 0.4 ms
Lead Channel Setting Sensing Sensitivity: 4 mV
Pulse Gen Model: 2240
Pulse Gen Serial Number: 3049931

## 2024-02-03 ENCOUNTER — Other Ambulatory Visit: Payer: Self-pay | Admitting: Urology

## 2024-02-03 DIAGNOSIS — N3281 Overactive bladder: Secondary | ICD-10-CM

## 2024-02-11 ENCOUNTER — Encounter: Payer: Self-pay | Admitting: Cardiology

## 2024-02-26 ENCOUNTER — Telehealth: Payer: Self-pay | Admitting: Family

## 2024-02-26 NOTE — Telephone Encounter (Signed)
 Called to confirm/remind patient of their appointment at [facility] on 02/27/24.   Appointment:   [] Confirmed  [x] Left mess   [] No answer/No voice mail  [] VM Full/unable to leave message  [] Phone not in service  Patient reminded to bring all medications and/or complete list.  Confirmed patient has transportation. Gave directions, instructed to utilize valet parking.

## 2024-02-26 NOTE — Progress Notes (Signed)
 Advanced Heart Failure Clinic Note    PCP: Emi Hanson, MD (last seen 03/25) Primary Cardiologist: Gollan, Timothy, MD (last seen 12/24) EP: Richardo Chandler, MD (last seen 02/25)  Chief Complaint: fatigue   HPI:  This is a 78 y/o male with a history of parkinson's, HTN, hyperlipidemia, CKD, GERD, CAD, complete heart block (pacemaker), atrial fibrillation, AICD and chronic heart failure.   Cardiac catheterization 11/20/12:  mild nonobstructive disease.  Echo 07/28/15: EF of 55-60% along with mild AR.  Echo 12/08/16: EF of 55-60% along with mild AR/MR.  Admitted 11/04/22 due to sepsis with acute respiratory failure. Blood cultures positive. TEE 11/09/22: EF 40-45% along with vegetation attached to RA pacemaker lead and mild/moderate AR. Poor extraction candidate. PICC line placed for 2 weeks of antibiotics.   Was in the ED 11/11/22 due to PICC line being dislodged. PICC line replaced.   Echo 07/31/23: EF 50-55% with mild LVH, severe LAE, mild MR, mild/ moderate AR, mild dilatation of the aortic root, measuring 40 mm. There is mild dilatation of the ascending aorta, measuring 41 mm.   He presents today, with his wife, for a HF f/u visit with a chief complaint of fatigue. Has associated shortness of breath, pedal edema, unsteadiness. Denies chest pain. Tremors and hallucinations have improved.   Has metolazone  at home but his wife says that he hasn't had to take this in ~ 1 year. Legs are swollen today because he hasn't taken his diuretic yet. Will go home and take the diuretic and also elevate his legs.   ROS: All systems negative except as listed in HPI, PMH and Problem List.  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  Tobacco Use   Smoking  status: Never    Passive exposure: Never   Smokeless tobacco: Never  Vaping Use   Vaping status: Never Used  Substance and Sexual Activity   Alcohol  use: No    Alcohol /week: 0.0 standard drinks of alcohol    Drug use: No   Sexual activity: Never  Other Topics Concern   Not on file  Social History Narrative   Not on file   Social Drivers of Health   Financial Resource Strain: Low Risk  (03/31/2023)   Received from [facility], [facility]   Overall Financial Resource Strain (CARDIA)    Difficulty of Paying Living Expenses: Not hard at all  Food Insecurity: No Food Insecurity (03/31/2023)   Received from [facility], [facility]   Hunger Vital Sign    Worried About Running Out of Food in the Last Year: Never true    Ran Out of Food in the Last Year: Never true  Transportation Needs: No Transportation Needs (03/31/2023)   Received from [facility], [facility]   PRAPARE - Transportation    In the past 12 months, has lack of transportation kept you from medical appointments or from getting medications?: No    Lack of Transportation (Non-Medical): No  Physical Activity: Insufficiently Active (10/26/2017)   Exercise Vital Sign    Days of Exercise per Week: 7 days    Minutes of Exercise per Session: 20 min  Stress: No Stress Concern Present (  10/26/2017)   [employer] [employer] - Occupational Stress Questionnaire    Feeling of Stress : Only a little  Social Connections: Moderately Integrated (10/26/2017)   Social Connection and Isolation Panel [NHANES]    Frequency of Communication with Friends and Family: More than three times a week    Frequency of Social Gatherings with Friends and Family: Never    Attends Religious Services: More than 4 times per year    Active Member of [employer] or Organizations: No    Attends Banker Meetings: Never    Marital  Status: Married  Catering manager Violence: Not At Risk (10/26/2017)   Humiliation, Afraid, Rape, and Kick questionnaire    Fear of Current or Ex-Partner: No    Emotionally Abused: No    Physically Abused: No    Sexually Abused: No    FH:  Family History  Problem Relation Age of Onset   Heart disease Mother    Heart disease Father    Colon cancer Father    Stroke Sister     Past Medical History:  Diagnosis Date   Arthritis    Atrial fibrillation (HCC)    Carpal tunnel syndrome on both sides    CHF (congestive heart failure) (HCC)    CKD (chronic kidney disease) 10/2018   recent diagnosis   Complete heart block (HCC)    a. s/p St. Jude PPM 11/2014.   Complication of anesthesia    stayed overnight dt under too long d/t taking Parkinson's medication (2013?)   Coronary artery disease    GERD (gastroesophageal reflux disease)    "not as bad as it used to be"   Heart murmur    History of recent blood transfusion 07/2018   Hyperlipidemia    Hypertension    IBS (irritable bowel syndrome)    Lower extremity edema    Parkinson's disease (HCC)    Presence of permanent cardiac pacemaker    St Jude   Shortness of breath dyspnea    Stage 3b chronic kidney disease (CKD) (HCC)     Current Outpatient Medications  Medication Sig Dispense Refill   acetaminophen  (TYLENOL ) 500 MG tablet Take 650 mg by mouth in the morning, at noon, and at bedtime.     allopurinol  (ZYLOPRIM ) 300 MG tablet Take 400 mg by mouth daily. 300 mg tablet + 100 mg tablet once daily  3   amiodarone  (PACERONE ) 200 MG tablet TAKE 1 TABLET BY MOUTH EVERY DAY 90 tablet 2   bisacodyl  (DULCOLAX) 5 MG EC tablet Take 5 mg by mouth 2 (two) times daily.     carvedilol  (COREG ) 3.125 MG tablet Take 3.125 mg by mouth 2 (two) times daily.     cholecalciferol (VITAMIN D3) 25 MCG (1000 UNIT) tablet Take 2,000 Units by mouth daily.     Cyanocobalamin (VITAMIN B 12) 500 MCG TABS Take 1,000 mcg by mouth daily.     desloratadine  (CLARINEX) 5 MG tablet Take 5 mg by mouth at bedtime.      ELIQUIS  5 MG TABS tablet TAKE 1 TABLET BY MOUTH TWICE A DAY 180 tablet 1   FLUoxetine (PROZAC) 10 MG capsule Take 30 mg by mouth daily.     fluticasone  (FLONASE ) 50 MCG/ACT nasal spray Place 2 sprays into the nose daily as needed for allergies.      GEMTESA  75 MG TABS TAKE 1 TABLET BY MOUTH EVERY DAY 90 tablet 3   lansoprazole (PREVACID) 15 MG capsule Take 15 mg by  mouth daily as needed (indigestion).      magnesium  oxide (MAG-OX) 400 MG tablet Take 400 mg by mouth at bedtime.     metolazone  (ZAROXOLYN ) 2.5 MG tablet TAKE 1 TABLET BY MOUTH DAILY AS NEEDED (ONCE DAILY AS NEEDED FOR WEIGHT GAIN OR SWELLING.). 90 tablet 3   Multiple Vitamins-Minerals (OCUVITE ADULT 50+ PO) Take 1 tablet by mouth daily.     polyethylene glycol (MIRALAX  / GLYCOLAX ) packet Take 17 g by mouth daily as needed (constipation.).      potassium chloride  (KLOR-CON ) 10 MEQ tablet Take 30 mEq by mouth daily.     pramipexole  (MIRAPEX ) 1 MG tablet Take 1-2 mg by mouth 3 (three) times daily. 2 tablets QAM, 1 tablet at lunch, and 1 tablet at night     pregabalin (LYRICA) 25 MG capsule Take 25 mg by mouth every other day.     Red Yeast Rice 600 MG CAPS Take 600 mg by mouth daily.     risperiDONE microspheres (RISPERDAL CONSTA) 25 MG injection Inject 25 mg into the muscle every 14 (fourteen) days.     sacubitril -valsartan  (ENTRESTO ) 24-26 MG TAKE 1 TABLET BY MOUTH TWICE A DAY 60 tablet 6   selegiline  (ELDEPRYL ) 5 MG capsule Take 5 mg by mouth 2 (two) times daily with a meal.      SENEXON-S 8.6-50 MG tablet Take 2 tablets by mouth 2 (two) times daily.     torsemide  (DEMADEX ) 20 MG tablet Take 20 mg by mouth. 20mg  daily alternating with 40mg  daily     traMADol  (ULTRAM ) 50 MG tablet      No current facility-administered medications for this visit.   Vitals:   02/27/24 1355  BP: (!) 105/50  Pulse: 60  SpO2: 93%   Wt Readings from Last 3 Encounters:  12/13/23 254 lb  (115.2 kg)  12/07/23 256 lb (116.1 kg)  10/02/23 253 lb 9.6 oz (115 kg)   Lab Results  Component Value Date   CREATININE 1.96 (H) 06/21/2023   CREATININE 2.36 (H) 06/13/2023   CREATININE 1.83 (H) 05/30/2023    PHYSICAL EXAM:  General: Well appearing. No resp difficulty HEENT: normal Neck: supple, no JVD Cor: Regular rhythm, rate. No rubs, gallops or murmurs Lungs: clear Abdomen: soft, nontender, nondistended. Extremities: no cyanosis, clubbing, rash, 2+ pitting edema bilateral lower legs Neuro: alert & oriented X 3. Moves all 4 extremities w/o difficulty. Affect pleasant   ECG: not done   ASSESSMENT & PLAN:  1: NICM with preserved ejection fraction- - NYHA class III - euvolemic - etiology likely atrial fibrillation as cath showed nonobstructive disease - weighing daily; Reminded to call for an overnight weight gain of >2 pounds or a weekly weight gain of >5 pounds - he declined weighing today - Echo 07/28/15: EF of 55-60% along with mild AR.  - Echo 12/08/16: EF of 55-60% along with mild AR/MR. - TEE 11/09/22: EF 40-45% along with vegetation attached to RA pacemaker lead and mild/moderate AR.   - Echo 07/31/23: EF 50-55% with mild LVH, severe LAE, mild MR, mild/ moderate AR, mild dilatation of the aortic root, measuring 40 mm. There is mild dilatation of the ascending aorta, measuring 41 mm. - cardiac catheterization 11/20/12:  mild nonobstructive disease. - not adding salt to his food and wife is diligent about reading food labels - continue carvedilol  3.125mg  BID - continue metolazone  2.5mg  PRN (wife says he hasn't taken this in ~ 1 year) - continue potassium 20meq M, W, F and the  other days  - continue entresto  24/26mg  BID - continue torsemide  40mg  daily M, W, F and 20mg  the other days; he will go home today and take 40mg  dose since he has so much edema - BP will not allow for MRA - saw cardiologist Jerelene Monday) 12/24 - drinking 64 ounces of fluid daily - wearing  compression socks daily - BNP 11/04/22 was 1216.0  2: HTN- - BP 105/50 - saw PCP Harwood Lingo) 03/25 - BMP 01/01/24 reviewed: sodium 144, potassium 5.0, creatinine 1.7  and GFR 41 - saw nephrology Zelda Hickman) 01/25  3: Atrial fibrillation- - cardioversion done 11/21/16, 02/08/23 & again 05/10/23 - saw EP Rodolfo Clan) 02/25 - continue apixaban  5mg  BID - continue amiodarone  200mg  daily  4: Parkinson's Disease- - tremors much better - hallucinations better - saw neurologist Walden Guise) 05/25   Wife says that it's getting harder & harder to get patient to appointments. Will make f/u as PRN but they can call for any questions or to make another appointment and they were comfortable with this plan.   Charlette Console, FNP 02/26/24

## 2024-02-27 ENCOUNTER — Ambulatory Visit: Payer: Medicare Other | Attending: Family | Admitting: Family

## 2024-02-27 ENCOUNTER — Encounter: Payer: Self-pay | Admitting: Family

## 2024-02-27 VITALS — BP 105/50 | HR 60

## 2024-02-27 DIAGNOSIS — I442 Atrioventricular block, complete: Secondary | ICD-10-CM | POA: Diagnosis not present

## 2024-02-27 DIAGNOSIS — E785 Hyperlipidemia, unspecified: Secondary | ICD-10-CM | POA: Insufficient documentation

## 2024-02-27 DIAGNOSIS — G20A1 Parkinson's disease without dyskinesia, without mention of fluctuations: Secondary | ICD-10-CM | POA: Insufficient documentation

## 2024-02-27 DIAGNOSIS — I5032 Chronic diastolic (congestive) heart failure: Secondary | ICD-10-CM | POA: Diagnosis not present

## 2024-02-27 DIAGNOSIS — Z950 Presence of cardiac pacemaker: Secondary | ICD-10-CM | POA: Insufficient documentation

## 2024-02-27 DIAGNOSIS — K219 Gastro-esophageal reflux disease without esophagitis: Secondary | ICD-10-CM | POA: Insufficient documentation

## 2024-02-27 DIAGNOSIS — I4891 Unspecified atrial fibrillation: Secondary | ICD-10-CM | POA: Diagnosis not present

## 2024-02-27 DIAGNOSIS — Z7901 Long term (current) use of anticoagulants: Secondary | ICD-10-CM | POA: Insufficient documentation

## 2024-02-27 DIAGNOSIS — I480 Paroxysmal atrial fibrillation: Secondary | ICD-10-CM

## 2024-02-27 DIAGNOSIS — I10 Essential (primary) hypertension: Secondary | ICD-10-CM

## 2024-02-27 DIAGNOSIS — Z79899 Other long term (current) drug therapy: Secondary | ICD-10-CM | POA: Diagnosis not present

## 2024-02-27 DIAGNOSIS — I130 Hypertensive heart and chronic kidney disease with heart failure and stage 1 through stage 4 chronic kidney disease, or unspecified chronic kidney disease: Secondary | ICD-10-CM | POA: Diagnosis not present

## 2024-02-27 DIAGNOSIS — N1832 Chronic kidney disease, stage 3b: Secondary | ICD-10-CM | POA: Insufficient documentation

## 2024-02-27 DIAGNOSIS — I428 Other cardiomyopathies: Secondary | ICD-10-CM | POA: Insufficient documentation

## 2024-02-27 DIAGNOSIS — I2510 Atherosclerotic heart disease of native coronary artery without angina pectoris: Secondary | ICD-10-CM | POA: Diagnosis not present

## 2024-02-27 NOTE — Patient Instructions (Addendum)
 It was great to see you today!   If you receive a satisfaction survey regarding the Heart Failure Clinic, please take the time to fill it out. This way we can continue to provide excellent care and make any changes that need to be made.    Call us  in the future if you need us  for anything

## 2024-03-14 NOTE — Addendum Note (Signed)
 Addended by: Lott Rouleau A on: 03/14/2024 04:05 PM   Modules accepted: Orders

## 2024-03-14 NOTE — Progress Notes (Signed)
 Remote pacemaker transmission.

## 2024-04-09 ENCOUNTER — Other Ambulatory Visit: Payer: Self-pay | Admitting: Cardiovascular Disease

## 2024-04-26 ENCOUNTER — Ambulatory Visit: Payer: Self-pay | Admitting: Physician Assistant

## 2024-04-30 ENCOUNTER — Ambulatory Visit: Payer: Medicare Other

## 2024-04-30 ENCOUNTER — Ambulatory Visit: Admitting: Physician Assistant

## 2024-04-30 DIAGNOSIS — I442 Atrioventricular block, complete: Secondary | ICD-10-CM

## 2024-05-01 LAB — CUP PACEART REMOTE DEVICE CHECK
Battery Remaining Longevity: 1 mo
Battery Remaining Percentage: 2 %
Battery Voltage: 2.66 V
Brady Statistic AP VP Percent: 99 %
Brady Statistic AP VS Percent: 1 %
Brady Statistic AS VP Percent: 1 %
Brady Statistic AS VS Percent: 1 %
Brady Statistic RA Percent Paced: 99 %
Brady Statistic RV Percent Paced: 99 %
Date Time Interrogation Session: 20250722020015
Implantable Lead Connection Status: 753985
Implantable Lead Connection Status: 753985
Implantable Lead Implant Date: 20160212
Implantable Lead Implant Date: 20160212
Implantable Lead Location: 753859
Implantable Lead Location: 753860
Implantable Lead Model: 1948
Implantable Pulse Generator Implant Date: 20160212
Lead Channel Impedance Value: 450 Ohm
Lead Channel Impedance Value: 540 Ohm
Lead Channel Pacing Threshold Amplitude: 0.625 V
Lead Channel Pacing Threshold Amplitude: 1.25 V
Lead Channel Pacing Threshold Pulse Width: 0.4 ms
Lead Channel Pacing Threshold Pulse Width: 0.4 ms
Lead Channel Sensing Intrinsic Amplitude: 1.5 mV
Lead Channel Sensing Intrinsic Amplitude: 12 mV
Lead Channel Setting Pacing Amplitude: 1.5 V
Lead Channel Setting Pacing Amplitude: 1.625
Lead Channel Setting Pacing Pulse Width: 0.4 ms
Lead Channel Setting Sensing Sensitivity: 4 mV
Pulse Gen Model: 2240
Pulse Gen Serial Number: 3049931

## 2024-05-02 ENCOUNTER — Telehealth: Payer: Self-pay

## 2024-05-02 NOTE — Telephone Encounter (Signed)
 PPM Scheduled remote reviewed. Normal device function.  Presenting rhythm:  AP-VP. Battery estimated at <3 mo remaining.  ____________________________________________________________________  Patient wife returned call and informed of battery reaching ERI and explained the need for monthly battery checks. Patient aware once device reaches ERI we will call and schedule an appointment with the provider. Patient appreciative for call. Informed to call device clinic back with any questions or concerns.

## 2024-05-03 ENCOUNTER — Other Ambulatory Visit: Payer: Self-pay

## 2024-05-03 ENCOUNTER — Encounter: Payer: Self-pay | Admitting: Family

## 2024-05-03 MED ORDER — METOLAZONE 2.5 MG PO TABS: ORAL_TABLET | ORAL | 0 refills | Status: DC

## 2024-05-03 MED ORDER — TORSEMIDE 20 MG PO TABS: ORAL_TABLET | ORAL | 3 refills | Status: DC

## 2024-05-03 MED ORDER — POTASSIUM CHLORIDE ER 10 MEQ PO TBCR
EXTENDED_RELEASE_TABLET | ORAL | 3 refills | Status: DC
Start: 2024-05-03 — End: 2024-08-01

## 2024-05-05 ENCOUNTER — Ambulatory Visit: Payer: Self-pay | Admitting: Cardiology

## 2024-05-06 ENCOUNTER — Ambulatory Visit (INDEPENDENT_AMBULATORY_CARE_PROVIDER_SITE_OTHER): Admitting: Physician Assistant

## 2024-05-06 ENCOUNTER — Encounter: Payer: Self-pay | Admitting: Physician Assistant

## 2024-05-06 VITALS — BP 110/62 | HR 68 | Ht 72.0 in | Wt 250.0 lb

## 2024-05-06 DIAGNOSIS — N3281 Overactive bladder: Secondary | ICD-10-CM | POA: Diagnosis not present

## 2024-05-06 LAB — BLADDER SCAN AMB NON-IMAGING: Scan Result: 37 mL

## 2024-05-06 MED ORDER — GEMTESA 75 MG PO TABS: 1.0000 | ORAL_TABLET | Freq: Every day | ORAL | 3 refills | Status: AC

## 2024-05-06 NOTE — Progress Notes (Signed)
 05/06/2024 5:05 PM   Matthew Howe 08/08/1946 [id number]  CC: Chief Complaint  Patient presents with   Follow-up   HPI: Matthew Howe is a 78 y.o. male with PMH Parkinson's disease, CHF on torsemide , and OAB on Gemtesa  who presents today for annual follow-up.   Today he reports stable health for the past year.  Urinary symptoms are well-controlled on Gemtesa  and they wish to continue this.  PVR 37 mL.  PMH: Past Medical History:  Diagnosis Date   Arthritis    Atrial fibrillation (HCC)    Carpal tunnel syndrome on both sides    CHF (congestive heart failure) (HCC)    CKD (chronic kidney disease) 10/2018   recent diagnosis   Complete heart block (HCC)    a. s/p St. Jude PPM 11/2014.   Complication of anesthesia    stayed overnight dt under too long d/t taking Parkinson's medication (2013?)   Coronary artery disease    GERD (gastroesophageal reflux disease)    not as bad as it used to be   Heart murmur    History of recent blood transfusion 07/2018   Hyperlipidemia    Hypertension    IBS (irritable bowel syndrome)    Lower extremity edema    Parkinson's disease (HCC)    Presence of permanent cardiac pacemaker    St Jude   Shortness of breath dyspnea    Stage 3b chronic kidney disease (CKD) (HCC)     Surgical History: Past Surgical History:  Procedure Laterality Date   APPENDECTOMY  12/2010   BACK SURGERY  08/06/2018   lower lumbarsacral region, has rods in place. (done at [facility])   CARDIAC CATHETERIZATION  11/20/2012   Known CAD with one vessel coronary disease of left descending artery by cardiac cath.    CARDIOVERSION N/A 11/21/2016   Procedure: CARDIOVERSION;  Surgeon: Elspeth JAYSON Sage, MD;  Location: [facility] ORS;  Service: Cardiovascular;  Laterality: N/A;   CARDIOVERSION N/A 03/08/2019   Procedure: CARDIOVERSION;  Surgeon: Okey Vina GAILS, MD;  Location: [facility] ENDOSCOPY;  Service: Cardiovascular;  Laterality: N/A;   CARDIOVERSION N/A 02/08/2023   Procedure:  CARDIOVERSION;  Surgeon: Darliss Rogue, MD;  Location: [facility] ORS;  Service: Cardiovascular;  Laterality: N/A;   CARDIOVERSION N/A 05/10/2023   Procedure: CARDIOVERSION;  Surgeon: Perla Evalene PARAS, MD;  Location: [facility] ORS;  Service: Cardiovascular;  Laterality: N/A;   CARPAL TUNNEL RELEASE Right 10/22/2018   Procedure: CARPAL TUNNEL RELEASE;  Surgeon: Mardee Lynwood SQUIBB, MD;  Location: [facility] ORS;  Service: Orthopedics;  Laterality: Right;   CARPAL TUNNEL RELEASE Left 05/22/2019   Procedure: CARPAL TUNNEL RELEASE - LEFT;  Surgeon: Mardee Lynwood SQUIBB, MD;  Location: [facility] ORS;  Service: Orthopedics;  Laterality: Left;   COLONOSCOPY  2018   COLONOSCOPY N/A 10/21/2020   Procedure: COLONOSCOPY;  Surgeon: Dessa Reyes ORN, MD;  Location: [facility] ENDOSCOPY;  Service: Endoscopy;  Laterality: N/A;   COLONOSCOPY WITH PROPOFOL  N/A 12/13/2023   Procedure: COLONOSCOPY WITH PROPOFOL ;  Surgeon: Toledo, Ladell POUR, MD;  Location: [facility] ENDOSCOPY;  Service: Gastroenterology;  Laterality: N/A;   HERNIA REPAIR  10/23/2019   umbilical hernia repair   INSERT / REPLACE / REMOVE PACEMAKER     PACEMAKER INSERTION  11-21-14   STJ Assurity dual chamber pacemaker implanted by Dr Kelsie for CHB   PERMANENT PACEMAKER INSERTION N/A 11/21/2014   Procedure: PERMANENT PACEMAKER INSERTION;  Surgeon: Lynwood Kelsie, MD;  Location: [facility] CATH LAB;  Service: Cardiovascular;  Laterality: N/A;   TEE  WITHOUT CARDIOVERSION N/A 11/21/2016   Procedure: TRANSESOPHAGEAL ECHOCARDIOGRAM (TEE);  Surgeon: Elspeth JAYSON Sage, MD;  Location: [facility] ORS;  Service: Cardiovascular;  Laterality: N/A;   TEE WITHOUT CARDIOVERSION N/A 11/09/2022   Procedure: TRANSESOPHAGEAL ECHOCARDIOGRAM;  Surgeon: Darliss Rogue, MD;  Location: [facility] ORS;  Service: Cardiovascular;  Laterality: N/A;   UPPER GI ENDOSCOPY      Home Medications:  Allergies as of 05/06/2024       Reactions   Linaclotide  Shortness Of Breath, Other (See Comments)   Codeine Diarrhea, Nausea And Vomiting,  Other (See Comments)   Terazosin Other (See Comments)   Dizziness / altered mental state  Dizziness / altered mental state  Dizziness / altered mental state    Gabapentin Other (See Comments)   dizziness Other reaction(s): Other (See Comments) dizziness   Hydrocodone -acetaminophen  Diarrhea, Nausea And Vomiting, Other (See Comments)   Lubiprostone  Other (See Comments)   Abdominal cramps Other reaction(s): Other (See Comments) Abdominal cramps Abdominal cramps   Norco [hydrocodone -acetaminophen ] Diarrhea, Nausea And Vomiting   Omeprazole Diarrhea, Nausea And Vomiting, Other (See Comments)   Stomach cramps   Oxycodone  Nausea And Vomiting   Ropinirole Nausea Only, Other (See Comments)        Medication List        Accurate as of May 06, 2024  5:05 PM. If you have any questions, ask your nurse or doctor.          acetaminophen  500 MG tablet Commonly known as: TYLENOL  Take 650 mg by mouth in the morning, at noon, and at bedtime.   allopurinol  300 MG tablet Commonly known as: ZYLOPRIM  Take 400 mg by mouth daily. 300 mg tablet + 100 mg tablet once daily   amiodarone  200 MG tablet Commonly known as: PACERONE  TAKE 1 TABLET BY MOUTH EVERY DAY   bisacodyl  5 MG EC tablet Commonly known as: DULCOLAX Take 5 mg by mouth 2 (two) times daily.   carvedilol  3.125 MG tablet Commonly known as: COREG  Take 3.125 mg by mouth 2 (two) times daily.   cholecalciferol 25 MCG (1000 UNIT) tablet Commonly known as: VITAMIN D3 Take 2,000 Units by mouth daily.   desloratadine 5 MG tablet Commonly known as: CLARINEX Take 5 mg by mouth at bedtime.   Eliquis  5 MG Tabs tablet Generic drug: apixaban  TAKE 1 TABLET BY MOUTH TWICE A DAY   Entresto  24-26 MG Generic drug: sacubitril -valsartan  TAKE 1 TABLET BY MOUTH TWICE A DAY   FLUoxetine 10 MG capsule Commonly known as: PROZAC Take 30 mg by mouth daily.   fluticasone  50 MCG/ACT nasal spray Commonly known as: FLONASE  Place 2 sprays  into the nose daily as needed for allergies.   Gemtesa  75 MG Tabs Generic drug: Vibegron  Take 1 tablet (75 mg total) by mouth daily.   lansoprazole 15 MG capsule Commonly known as: PREVACID Take 15 mg by mouth daily as needed (indigestion).   magnesium  oxide 400 MG tablet Commonly known as: MAG-OX Take 400 mg by mouth at bedtime.   metolazone  2.5 MG tablet Commonly known as: ZAROXOLYN  TAKE 1 TABLET BY MOUTH DAILY AS NEEDED (ONCE DAILY AS NEEDED FOR WEIGHT GAIN OR SWELLING.).   OCUVITE ADULT 50+ PO Take 1 tablet by mouth daily.   polyethylene glycol 17 g packet Commonly known as: MIRALAX  / GLYCOLAX  Take 17 g by mouth daily as needed (constipation.).   potassium chloride  10 MEQ tablet Commonly known as: KLOR-CON  M, W, F and the other days of the weekTake 20 mEq by  mouth daily. 20meq M, W, F and the other days of the week   pramipexole  1 MG tablet Commonly known as: MIRAPEX  Take 1-2 mg by mouth 3 (three) times daily. 2 tablets QAM, 1 tablet at lunch, and 1 tablet at night   Red Yeast Rice 600 MG Caps Take 600 mg by mouth daily.   risperiDONE 0.25 MG tablet Commonly known as: RISPERDAL Take 0.25 mg by mouth 2 (two) times daily.   risperiDONE microspheres 25 MG injection Commonly known as: RISPERDAL CONSTA Inject 25 mg into the muscle every 14 (fourteen) days.   selegiline  5 MG capsule Commonly known as: ELDEPRYL  Take 5 mg by mouth 2 (two) times daily with a meal.   Senexon-S 8.6-50 MG tablet Generic drug: senna-docusate Take 2 tablets by mouth 2 (two) times daily.   torsemide  20 MG tablet Commonly known as: DEMADEX  40mg  M, W, F & 20mg  the other 4 days of the weekTake 20 mg by mouth. 40mg  M, W, F & 20mg  the other 4 days of the week   traMADol  50 MG tablet Commonly known as: ULTRAM  Take 50 mg by mouth 2 (two) times daily.   Vitamin B 12 500 MCG Tabs Take 1,000 mcg by mouth daily.        Allergies:  Allergies  Allergen Reactions    Linaclotide  Shortness Of Breath and Other (See Comments)   Codeine Diarrhea, Nausea And Vomiting and Other (See Comments)   Terazosin Other (See Comments)    Dizziness / altered mental state  Dizziness / altered mental state  Dizziness / altered mental state    Gabapentin Other (See Comments)    dizziness Other reaction(s): Other (See Comments) dizziness   Hydrocodone -Acetaminophen  Diarrhea, Nausea And Vomiting and Other (See Comments)   Lubiprostone  Other (See Comments)    Abdominal cramps Other reaction(s): Other (See Comments) Abdominal cramps Abdominal cramps   Norco [Hydrocodone -Acetaminophen ] Diarrhea and Nausea And Vomiting   Omeprazole Diarrhea, Nausea And Vomiting and Other (See Comments)    Stomach cramps   Oxycodone  Nausea And Vomiting   Ropinirole Nausea Only and Other (See Comments)    Family History: Family History  Problem Relation Age of Onset   Heart disease Mother    Heart disease Father    Colon cancer Father    Stroke Sister     Social History:   reports that he has never smoked. He has never been exposed to tobacco smoke. He has never used smokeless tobacco. He reports that he does not drink alcohol  and does not use drugs.  Physical Exam: BP 110/62   Pulse 68   Ht 6' (1.829 m)   Wt 250 lb (113.4 kg)   BMI 33.91 kg/m   Constitutional:  Alert and oriented, no acute distress, nontoxic appearing HEENT: [place], AT Cardiovascular: No clubbing, cyanosis, or edema Respiratory: Normal respiratory effort, no increased work of breathing Skin: No rashes, bruises or suspicious lesions Neurologic: Grossly intact, no focal deficits, moving all 4 extremities Psychiatric: Normal mood and affect  Laboratory Data: Results for orders placed or performed in visit on 05/06/24  BLADDER SCAN AMB NON-IMAGING   Collection Time: 05/06/24  5:07 PM  Result Value Ref Range   Scan Result 37 ml   Assessment & Plan:   1. OAB (overactive bladder) (Primary) Well-controlled on  Gemtesa  and he continues to empty well.  Will plan to continue this and see him back next year for annual follow-up. - BLADDER SCAN AMB NON-IMAGING - Vibegron  (GEMTESA ) 75  MG TABS; Take 1 tablet (75 mg total) by mouth daily.  Dispense: 90 tablet; Refill: 3   Return in about 1 year (around 05/06/2025) for Annual OAB f/u with PVR.  Lucie Hones, PA-C  [facility] [place] [address], [address] [place], [place] [address] [phone]

## 2024-05-06 NOTE — Telephone Encounter (Signed)
 Call x1; lvmtcb and mychart msg sent to patient, to schedule with JP in the next 2 months to discuss gen change + establish care from [facility]

## 2024-05-12 ENCOUNTER — Other Ambulatory Visit: Payer: Self-pay | Admitting: Family

## 2024-05-13 NOTE — Telephone Encounter (Signed)
 This is an as needed medication. Should not need 90 tablets.

## 2024-05-21 ENCOUNTER — Encounter: Admitting: Cardiology

## 2024-06-03 ENCOUNTER — Encounter

## 2024-06-04 ENCOUNTER — Encounter: Admitting: Cardiology

## 2024-06-17 NOTE — Progress Notes (Unsigned)
 Electrophysiology Office Note:   Date:  06/18/2024  ID:  Matthew, Howe [birth year], MRN [id number]  Primary Cardiologist: Matthew Lunger, MD Electrophysiologist: Matthew Kitty, MD      History of Present Illness:   Matthew Howe is a 78 y.o. male with h/o parkinson's, HTN, hyperlipidemia, CKD, GERD, CAD, complete heart block s/p pacemaker, persistent atrial fibrillation, chronic diastolic heart failure who is being seen today for follow up pacemaker management.  Discussed the use of AI scribe software for clinical note transcription with the patient, who gave verbal consent to proceed.  History of Present Illness Matthew Howe is a 78 year old male with a pacemaker and atrial fibrillation who presents for device check and battery replacement. He was referred by Dr. Fernande due to his retirement.  The battery of his pacemaker is nearing the end of its life. He is completely dependent on the pacemaker. He is taking amiodarone  to manage his atrial fibrillation and Eliquis  to prevent strokes. He has not had any recent AF. He has not experienced any falls recently, although he did slide down to the floor two weeks ago without falling, requiring assistance from EMTs to get up.  His Parkinson's disease has progressed, leading to increased slowness and reliance on a walker. Despite the challenges, he reports being 'all right for an old man' and notes that he has been luckier than some with the disease, but it still presents significant challenges.  No new or acute complaints today.   Review of systems complete and found to be negative unless listed in HPI.   EP Information / Studies Reviewed:    EKG is ordered today. Personal review as below.  EKG Interpretation Date/Time:  Tuesday June 18 2024 10:07:13 EDT Ventricular Rate:  62 PR Interval:    QRS Duration:  190 QT Interval:  568 QTC Calculation: 576 R Axis:   -82  Text Interpretation: AV dual-paced rhythm When compared with ECG  of 07-Dec-2023 09:10, No significant change was found Confirmed by Matthew Howe [phone]) on 06/18/2024 10:19:01 AM   Echo 07/2023:  1. Left ventricular ejection fraction, by estimation, is 50 to 55%. The  left ventricle has low normal function. The left ventricle has no regional  wall motion abnormalities. There is mild left ventricular hypertrophy.  Left ventricular diastolic  parameters are indeterminate.   2. Right ventricular systolic function is normal. The right ventricular  size is normal.   3. Left atrial size was severely dilated.   4. The mitral valve is normal in structure. Mild mitral valve  regurgitation.   5. The aortic valve is tricuspid. Aortic valve regurgitation is mild to  moderate.   6. Aortic dilatation noted. There is mild dilatation of the aortic root,  measuring 40 mm. There is mild dilatation of the ascending aorta,  measuring 41 mm.   Risk Assessment/Calculations:    CHA2DS2-VASc Score = 5   This indicates a 7.2% annual risk of stroke. The patient's score is based upon: CHF History: 1 HTN History: 1 Diabetes History: 0 Stroke History: 0 Vascular Disease History: 1 Age Score: 2 Gender Score: 0             Physical Exam:   VS:  BP (!) 118/54 (BP Location: Left Arm, Patient Position: Sitting, Cuff Size: Normal)   Pulse 62   Ht 6' (1.829 m)   Wt 250 lb (113.4 kg)   SpO2 95%   BMI 33.91 kg/m    Wt Readings from Last  3 Encounters:  06/18/24 250 lb (113.4 kg)  05/06/24 250 lb (113.4 kg)  12/13/23 254 lb (115.2 kg)     GEN: Well nourished, well developed in no acute distress NECK: No JVD CARDIAC: Normal rate, regular rhythm. Well healed left chest pacer pocket. RESPIRATORY:  Clear to auscultation without rales, wheezing or rhonchi  ABDOMEN: Soft, non-distended EXTREMITIES:  No edema; No deformity   ASSESSMENT AND PLAN:    #. CHB s/p pacemaker: -In clinic device interrogation performed today. Appropriate device function and stable lead  parameters. He is dependent. Estimated longevity <3 months, likely to hit ERI in near future.  -Continue remote monitoring. -Risks, benefits, and alternatives to ICD pulse generator replacement were discussed in detail today.  The patient understands that risks include but are not limited to bleeding, infection, pneumothorax, perforation, tamponade, vascular damage, renal failure, MI, stroke, death, inappropriate shocks, damage to his existing leads, and lead dislodgement and wishes to proceed.  We will therefore tentatively schedule in November at [facility].  #. Persistent atrial fibrillation: AT/AF burden 0% on device. #. High risk medication use: Amiodarone . - Repeat TSH and LFTs. - Continue amiodarone  200mg  once daily. - Monitor AF burden with pacemaker.   #. Secondary hypercoagulable state due to AF: Denies bleeding issues. No falls. -Continue Eliquis  5mg  BID.   #. Chronic diastolic heart failure: Well compensated on exam today. - Continue follow up with HF clinic.  Follow up with Dr. Kennyth for generator change once ERI has been reached.   Signed, Matthew Kennyth, MD

## 2024-06-18 ENCOUNTER — Other Ambulatory Visit: Payer: Self-pay

## 2024-06-18 ENCOUNTER — Encounter: Payer: Self-pay | Admitting: Cardiology

## 2024-06-18 ENCOUNTER — Ambulatory Visit: Attending: Cardiology | Admitting: Cardiology

## 2024-06-18 VITALS — BP 118/54 | HR 62 | Ht 72.0 in | Wt 250.0 lb

## 2024-06-18 DIAGNOSIS — I442 Atrioventricular block, complete: Secondary | ICD-10-CM | POA: Diagnosis present

## 2024-06-18 DIAGNOSIS — Z79899 Other long term (current) drug therapy: Secondary | ICD-10-CM | POA: Diagnosis present

## 2024-06-18 DIAGNOSIS — D6869 Other thrombophilia: Secondary | ICD-10-CM | POA: Diagnosis present

## 2024-06-18 DIAGNOSIS — I4819 Other persistent atrial fibrillation: Secondary | ICD-10-CM | POA: Diagnosis not present

## 2024-06-18 DIAGNOSIS — I5032 Chronic diastolic (congestive) heart failure: Secondary | ICD-10-CM | POA: Diagnosis present

## 2024-06-18 DIAGNOSIS — Z950 Presence of cardiac pacemaker: Secondary | ICD-10-CM

## 2024-06-18 LAB — CUP PACEART INCLINIC DEVICE CHECK
Battery Remaining Longevity: 0 mo
Battery Voltage: 2.62 V
Brady Statistic RA Percent Paced: 99.91 %
Brady Statistic RV Percent Paced: 99.99 %
Date Time Interrogation Session: 20250909103742
Implantable Lead Connection Status: 753985
Implantable Lead Connection Status: 753985
Implantable Lead Implant Date: 20160212
Implantable Lead Implant Date: 20160212
Implantable Lead Location: 753859
Implantable Lead Location: 753860
Implantable Lead Model: 1948
Implantable Pulse Generator Implant Date: 20160212
Lead Channel Impedance Value: 487.5 Ohm
Lead Channel Impedance Value: 575 Ohm
Lead Channel Pacing Threshold Amplitude: 0.625 V
Lead Channel Pacing Threshold Amplitude: 1.25 V
Lead Channel Pacing Threshold Pulse Width: 0.4 ms
Lead Channel Pacing Threshold Pulse Width: 0.4 ms
Lead Channel Sensing Intrinsic Amplitude: 1.4 mV
Lead Channel Sensing Intrinsic Amplitude: 12 mV
Lead Channel Setting Pacing Amplitude: 1.5 V
Lead Channel Setting Pacing Amplitude: 1.625
Lead Channel Setting Pacing Pulse Width: 0.4 ms
Lead Channel Setting Sensing Sensitivity: 4 mV
Pulse Gen Model: 2240
Pulse Gen Serial Number: 3049931

## 2024-06-18 NOTE — Patient Instructions (Addendum)
 Medication Instructions:  Your physician recommends that you continue on your current medications as directed. Please refer to the Current Medication list given to you today.  *If you need a refill on your cardiac medications before your next appointment, please call your pharmacy*  Lab Work: TODAY: TSH, LFT's  Testing/Procedures: Pacemaker Generator Change - we will plan to have this done on November 17th at [facility]. We will contact you to confirm scheduling once your device hits ERI.  Follow-Up: At [facility], you and your health needs are our priority.  As part of our continuing mission to provide you with exceptional heart care, our providers are all part of one team.  This team includes your primary Cardiologist (physician) and Advanced Practice Providers or APPs (Physician Assistants and Nurse Practitioners) who all work together to provide you with the care you need, when you need it.  Your next appointment:   We will contact you to arrange your post-procedure appointments

## 2024-06-19 LAB — HEPATIC FUNCTION PANEL
ALT: 18 IU/L (ref 0–44)
AST: 21 IU/L (ref 0–40)
Albumin: 4.2 g/dL (ref 3.8–4.8)
Alkaline Phosphatase: 62 IU/L (ref 44–121)
Bilirubin Total: 0.4 mg/dL (ref 0.0–1.2)
Bilirubin, Direct: 0.13 mg/dL (ref 0.00–0.40)
Total Protein: 6.2 g/dL (ref 6.0–8.5)

## 2024-06-19 LAB — TSH: TSH: 1.45 u[IU]/mL (ref 0.450–4.500)

## 2024-06-30 ENCOUNTER — Ambulatory Visit: Payer: Self-pay | Admitting: Cardiology

## 2024-07-01 NOTE — Progress Notes (Signed)
Monthly battery checks are scheduled. 

## 2024-07-04 ENCOUNTER — Ambulatory Visit

## 2024-07-10 LAB — CUP PACEART REMOTE DEVICE CHECK
Battery Remaining Longevity: 1 mo
Battery Remaining Percentage: 0.5 %
Battery Voltage: 2.6 V
Brady Statistic AP VP Percent: 99 %
Brady Statistic AP VS Percent: 1 %
Brady Statistic AS VP Percent: 1 %
Brady Statistic AS VS Percent: 1 %
Brady Statistic RA Percent Paced: 99 %
Brady Statistic RV Percent Paced: 99 %
Date Time Interrogation Session: 20250930235222
Implantable Lead Connection Status: 753985
Implantable Lead Connection Status: 753985
Implantable Lead Implant Date: 20160212
Implantable Lead Implant Date: 20160212
Implantable Lead Location: 753859
Implantable Lead Location: 753860
Implantable Lead Model: 1948
Implantable Pulse Generator Implant Date: 20160212
Lead Channel Impedance Value: 450 Ohm
Lead Channel Impedance Value: 540 Ohm
Lead Channel Pacing Threshold Amplitude: 0.625 V
Lead Channel Pacing Threshold Amplitude: 1.25 V
Lead Channel Pacing Threshold Pulse Width: 0.4 ms
Lead Channel Pacing Threshold Pulse Width: 0.4 ms
Lead Channel Sensing Intrinsic Amplitude: 1.4 mV
Lead Channel Sensing Intrinsic Amplitude: 12 mV
Lead Channel Setting Pacing Amplitude: 1.5 V
Lead Channel Setting Pacing Amplitude: 1.625
Lead Channel Setting Pacing Pulse Width: 0.4 ms
Lead Channel Setting Sensing Sensitivity: 4 mV
Pulse Gen Model: 2240
Pulse Gen Serial Number: 3049931

## 2024-07-11 ENCOUNTER — Ambulatory Visit: Payer: Self-pay | Admitting: Cardiology

## 2024-07-12 NOTE — Progress Notes (Signed)
 Remote PPM Transmission

## 2024-07-15 ENCOUNTER — Telehealth: Payer: Self-pay

## 2024-07-15 NOTE — Telephone Encounter (Addendum)
 Alert remote transmission:  Device reached ERI 07/15/24  Patient is DEPENDENT.

## 2024-07-17 NOTE — Telephone Encounter (Signed)
 Pt scheduled at [facility] on 11/17 at 1130 with Dr. Kennyth for PPM Gen Change.[email or web address]

## 2024-07-23 ENCOUNTER — Other Ambulatory Visit: Payer: Self-pay

## 2024-07-23 DIAGNOSIS — Z45010 Encounter for checking and testing of cardiac pacemaker pulse generator [battery]: Secondary | ICD-10-CM

## 2024-07-23 DIAGNOSIS — I442 Atrioventricular block, complete: Secondary | ICD-10-CM

## 2024-07-26 ENCOUNTER — Other Ambulatory Visit: Payer: Self-pay | Admitting: Family

## 2024-07-30 ENCOUNTER — Other Ambulatory Visit: Payer: Self-pay | Admitting: Cardiovascular Disease

## 2024-07-30 ENCOUNTER — Ambulatory Visit: Payer: Medicare Other | Attending: Internal Medicine

## 2024-07-30 DIAGNOSIS — I442 Atrioventricular block, complete: Secondary | ICD-10-CM

## 2024-07-30 DIAGNOSIS — I480 Paroxysmal atrial fibrillation: Secondary | ICD-10-CM

## 2024-07-30 NOTE — Telephone Encounter (Signed)
 Eliquis  5mg  refill request received. Patient is 78 years old, weight-113.4kg, Crea-1.4 on 07/03/24 via [facility] From [place], [place], and last seen by Dr. Kennyth on 06/18/24. Dose is appropriate based on dosing criteria. Will send in refill to requested pharmacy.

## 2024-07-31 ENCOUNTER — Other Ambulatory Visit: Payer: Self-pay | Admitting: Family

## 2024-07-31 LAB — CUP PACEART REMOTE DEVICE CHECK
Battery Remaining Longevity: 0 mo
Battery Voltage: 2.59 V
Brady Statistic AP VP Percent: 99 %
Brady Statistic AP VS Percent: 1 %
Brady Statistic AS VP Percent: 1 %
Brady Statistic AS VS Percent: 1 %
Brady Statistic RA Percent Paced: 72 %
Brady Statistic RV Percent Paced: 99 %
Date Time Interrogation Session: 20251021020026
Implantable Lead Connection Status: 753985
Implantable Lead Connection Status: 753985
Implantable Lead Implant Date: 20160212
Implantable Lead Implant Date: 20160212
Implantable Lead Location: 753859
Implantable Lead Location: 753860
Implantable Lead Model: 1948
Implantable Pulse Generator Implant Date: 20160212
Lead Channel Impedance Value: 450 Ohm
Lead Channel Impedance Value: 580 Ohm
Lead Channel Pacing Threshold Amplitude: 0.625 V
Lead Channel Pacing Threshold Amplitude: 1.5 V
Lead Channel Pacing Threshold Pulse Width: 0.4 ms
Lead Channel Pacing Threshold Pulse Width: 0.4 ms
Lead Channel Sensing Intrinsic Amplitude: 0.6 mV
Lead Channel Sensing Intrinsic Amplitude: 12 mV
Lead Channel Setting Pacing Amplitude: 1.625
Lead Channel Setting Pacing Amplitude: 1.75 V
Lead Channel Setting Pacing Pulse Width: 0.4 ms
Lead Channel Setting Sensing Sensitivity: 4 mV
Pulse Gen Model: 2240
Pulse Gen Serial Number: 3049931

## 2024-08-01 ENCOUNTER — Telehealth: Payer: Self-pay

## 2024-08-01 NOTE — Telephone Encounter (Addendum)
 PPM scheduled remote: flagging for review Several AHR detections, longest lasted 1 day 5 hours,  AF burden 32% overall, however 100% since 07/26/24. Known PAF, on Eliquis .  Routing to triage for increase in AF burden from previous in setting of known AF.   Appears patient has been in ongoing atrial flutter since 07/26/24, Gen change is planned for 08/26/24.    Patient's wife states that he hasn't felt as well the last several days. Weaker, more trouble getting around.   No other changes to diet, health or medications. No missed doses of meds.  Meds of note:  amiodarone  200mg  1 daily, carvedilol  3.125mg  bid.   Wife aware we will forward to Dr. Kennyth and follow up with any further recommendations.

## 2024-08-02 NOTE — Progress Notes (Signed)
 Remote PPM Transmission

## 2024-08-03 ENCOUNTER — Ambulatory Visit: Payer: Self-pay | Admitting: Cardiology

## 2024-08-06 ENCOUNTER — Telehealth (HOSPITAL_COMMUNITY): Payer: Self-pay

## 2024-08-06 ENCOUNTER — Encounter (HOSPITAL_COMMUNITY): Payer: Self-pay

## 2024-08-06 NOTE — Telephone Encounter (Signed)
 Attempted to reach patient to discuss upcoming procedure, no answer. Left VM for patient to return call.

## 2024-08-06 NOTE — Telephone Encounter (Signed)
 Spoke with patient's wife, Matthew Howe, to discuss upcoming procedure.   Confirmed patient is scheduled for a PPM generator change on Monday, November 17 with Dr. Sidra Kitty. Instructed patient to arrive at [facility] entrance, [address] [place], [place], [place]. [address] and check in at front desk at 10:30 AM.   Labs: Plans to complete 10/31  Any recent signs of acute illness or been started on antibiotics? No  Any new medications started? No Any medications to hold? Yes.  HOLD: Eliquis  (Apixaban ) for 3 day(s) prior to your procedure. Your last dose will be Thursday, November 13, PM dose.  Medication instructions:  On the morning of your procedure HOLD Torsemide  and Metolazone . Take all other morning medications not discussed with a sip of water.  No eating or drinking after midnight prior to procedure.   The night before your procedure and the morning of your procedure, wash thoroughly with the CHG surgical soap from the neck down, paying special attention to the area where your procedure will be performed.  Advised of plan to go home the same day and will only stay overnight if medically necessary. You MUST have a responsible adult to drive you home and MUST be with you the first 24 hours after you arrive home.  Informed a nurse may call a day before the procedure to confirm arrival time and ensure instructions are followed.  Patient's wife verbalized understanding to all instructions provided and agreed to proceed with procedure.

## 2024-08-06 NOTE — Telephone Encounter (Signed)
 Discussed with Dr. Kennyth.  Per Dr. Kennyth, Pt will need to have his device change out before he can treat atrial fibrillation, since he would need to not interrupt OAC after a cardioversion.      Outreach made to Pt's wife.  Advised Dr. Kennyth has plan to treat atrial fibrillation after Pt's device change out.  Wife indicates understanding.

## 2024-08-13 LAB — BASIC METABOLIC PANEL WITH GFR
BUN/Creatinine Ratio: 17 (ref 10–24)
BUN: 26 mg/dL (ref 8–27)
CO2: 21 mmol/L (ref 20–29)
Calcium: 9.4 mg/dL (ref 8.6–10.2)
Chloride: 107 mmol/L — AB (ref 96–106)
Creatinine, Ser: 1.54 mg/dL — AB (ref 0.76–1.27)
Glucose: 93 mg/dL (ref 70–99)
Potassium: 4.7 mmol/L (ref 3.5–5.2)
Sodium: 142 mmol/L (ref 134–144)
eGFR: 46 mL/min/1.73 — AB (ref >59)

## 2024-08-13 LAB — CBC
Hematocrit: 40.3 % (ref 37.5–51.0)
Hemoglobin: 12.9 g/dL — ABNORMAL LOW (ref 13.0–17.7)
MCH: 30.9 pg (ref 26.6–33.0)
MCHC: 32 g/dL (ref 31.5–35.7)
MCV: 96 fL (ref 79–97)
Platelets: 224 x10E3/uL (ref 150–450)
RBC: 4.18 x10E6/uL (ref 4.14–5.80)
RDW: 14 % (ref 11.6–15.4)
WBC: 5.8 x10E3/uL (ref 3.4–10.8)

## 2024-08-14 ENCOUNTER — Ambulatory Visit: Payer: Self-pay | Admitting: Cardiology

## 2024-08-21 NOTE — Progress Notes (Signed)
 Today the history is gathered from: 20% - patient  80% - patient's wife today  REFERRING PHYSICIAN: Epifanio Alm Dudley*   IMPRESSION/PLAN  Matthew Howe is a 78 y.o.  male presenting for evaluation of  PARKINSON'S DISEASE/ MUSCLE STIFFNESS/ PAIN/ DIFFICULTY WALKING/ PACEMAKER - Unchanged.  - Patient with pacemaker in place presenting with Parkinson's disease. Unchanged lower extremity weakness, muscle stiffness and significant unstable gait with high risk for falls. Able to walk short distances with walker. Has bradykinesia, no shuffling gait. Unchanged hypophonia. Seldom tremors in bilateral hands, not interfering with daily activitues. No dysphagia, choking, difficulty chewing. Variable sleep difficulty, will wake up for nocturia. Occasional hallucinations. - Continue Tramadol  50 mg twice daily. Refill. - Continue Mirapex  1 mg four times daily. Refill 6 months - Continue Risperdal 0.25 mg twice daily for hallucinations. Refill 6 months.  - Continue Selegiline  5 mg twice daily. Refill 6 months. - Encouraged patient to stay physically active and exercise on regular basis (90 mins per week or 15 mins per day). Exercise can be very beneficial in many neurological conditions. Ambulate with walker at all times.  - Patient and patient's wife are in agreement and would like to defer home health assessment, PT/OT due recent a-fib with upcoming surgeries for pacemaker battery replacement and cataract surgery. Will reconsider at future visit.  Follow up with Dr. Lane in 4-6 months.   Medications previously tried: Namenda 5 mg BID - hallucinations Sinemet - locked him up, was ineffective for PD.  p=4  CC/HPI  Matthew Howe is a 78 y.o. male presenting for evaluation of: Chief Complaint  Patient presents with  . PARKINSON'S DISEASE/ MUSCLE STIFFNESS/ PAIN/ DIFFICULTY WALK  . Pacemaker in place    PARKINSONS DISEASE/ MUSCLE STIFFNESS / PAIN / DIFFICULTY WALKING/ PACEMAKER Patient's wife  reports he has been in atrial fibrillation for a few weeks. Will have battery replaced in pacemaker on 08/26/2024. No new Parkinson's symptoms. Unchanged muscle stiffness in extremities. Significant unstable gait and imbalance. Ambulating with wheelchair in clinic. Will walk short distances with walker. Has bradykinesia, no shuffling gait. Unchanged hypophonia, will wear neck brace to help with his speech. Unchanged weakness in lower extremities. No SOB, angina. Seldom tremors in bilateral hands, not bothersome or interfering with daily activitues. Slight drooling, not bothersome. No dysphagia, choking, difficulty chewing. Variable sleep difficulty, will wake up for nocturia. Occasional hallucinations. Taking Tramadol  50 mg BID, Mirapex  1 mg QID, Risperdal 0.25 mg BID, Selegiline  5 mg BID. Cataract surgery in February 2025. Per wife, they would like to defer home health assessment and equipment for transitions at this time due to patients current health status of a-fib and upcoming surgeries.  DATA SUMMARY 11/04/2022 CT HEAD WO CONTRAST IMPRESSION:  Motion degraded images.  No evidence of acute intracranial abnormality.   06/13/2018 EMG Impression: Abnormal study. There is electrodiagnostic evidence of chronic, severe bilateral carpal tunnel syndrome, right worse than left.  VISIT SUMMARIES:  04/26/2023: Patient with ongoing tremors occurring in right arm. Occasional spasms occurring in the whole body, usually while asleep. Ongoing hallucinations occurring, usually occurring when he wakes up from naps. Patient's wife reports sleep is sporadic, he sleeps during the day and them is up late at night. Gets around 8 hours nightly. Extreme fatigue during the day, likely associated with cardiac concerns. Endorses taking Pregabalin every other day. Stiffness noted in the chest and in the bilateral arms. Worse in the mornings. Notes staggering while walking. Many recent concerns with cardiology affecting  patient's energy and  mood. Mood has been stable, denies any recent depression or anxiety. No longer doing at home PT, notes weakness in legs is interfering with PT. Patient's wife endorses having an at home wheelchair to help transport husband. Taking Mirapex  1 mg four times daily and Selegiline  5 mg BID. Takes Lyrica 25 mg every other day.   02/15/2022: Patient states symptoms are a little worse. He feels like his walking is a lot worse. Takes Mirapex  1 mg three times daily, Selegiline  5 mg twice daily. Continue to follow cardiology for shortness of breath.Refill Mirapex  1 mg three time a day. Refill Selegiline  5 mg twice a day.  Start Sinemet 25/100 twice a day 1 week, then increase to 25/100 three time a day.  Start Meloxicam 15 mg as needed. Continue to stay active for 15 minutes per day. Call our office if you would like referral to PT.  04/09/21: Patient with Parkinson's disease, muscle stiffness, and pain. Patient taking Mirapex  0.75 mg three times daily and Selegiline  5 mg twice daily.  Patient on flecainide  so any time we start new medication need to ensure there is no interaction with this. Will increase Mirapex  to 1 mg three times daily, if this is not working can decrease back to 0.75 mg three times daily and could consider starting Sinemet in the future.  03/30/20: Patient with Parkinson's and muscle stiffness, improving. Continue Mirapex  0.75 mg three times daily and Selegiline  5 mg twice daily. We encouraged patient to stay physically active and exercise on regular basis.  12/03/19: Patient with Parkinson's, worse, muscle stiffness and pain, ongoing. Increase Mirapex  to 0.75 mg three times daily. Continue Selegiline  5 mg twice daily. We encouraged patient to stay physically active and exercise on regular basis. Exercise can be very beneficial in many neurological conditions.  08/05/19: Patient with Parkinson's disease, ongoing, and muscle stiffness, worse. Taking Selegiline  5 mg twice daily,  refilled. Taking Mirapex  0.5 mg three times daily, refilled.   04/02/2019: Patient with Parkinson's disease, worse, and muscle stiffness and pain, stable. Continue Selegiline  5 mg twice daily and Mirapex  0.5 mg three times daily. Will send urgent referral to gastroenterology for consultation for constipation.   10/17/2018: Patient with Parkinson's disease, improving tremors, walking, and memory. Taking Selegiline  5 mg twice a day, refilled.  Taking Mirapex  0.5mg  three times a day, refilled. Encouraged patient to stay physically active and exercise 15 minutes daily on regular basis. Encouraged patient to complete games, puzzles, or read to stimulate the brain. Advised patient to consult with cardiologist about stopping carvedilol  to switch to Terazosin.   04/17/18: Parkinson's, ongoing. Mild to moderate tremor in right hand. He reports increased swelling in his hands and was recently diagnosed with gout. Taking Selegiline  5 mg twice daily for tremor, continue taking this. Taking Mirapex  0.5 mg three times daily for tremor, continue taking this. Will continue to monitor weight. Patient with a history of muscle pain and stiffness. The mornings are better with the stiffness. He states that he doesnt drag his feet as much. The patient states that his speech and mind is better.  10/31/2017: Right hand tremor unchanged. Increase Mirapex  to 0.5 mg to three times per day for tremor. Refill Selegiline  5 mg twice per day for tremor.   03/20/2017: Tremor is unchanged.  Recommend stop taking Gabapentin. Recommend stop taking selegiline  5 mg. Recommend increasing pramipexole  to 0.5 mg in the morning and 0.5 mg at night Recommend patient stay active. Refill Meloxicam 15 mg daily to control for muscle pain.  12/28/16: Patient with parkinson's disease. Recommend Gabapentin 100 mg three times a day for 2 weeks, then increase to 200 mg three times a day for 2 weeks , then 300 mg three times a day. Refill selegiline  5 mg  two times daily and pramipexole  0.75 mg at night.   Medications previously tried (reason stopped):  Gabapentin (dizziness)    MEDICATIONS Current Outpatient Medications  Medication Sig Dispense Refill  . acetaminophen  (TYLENOL ) 650 MG ER tablet Take 650 mg by mouth every 8 (eight) hours as needed for Pain    . allopurinoL  (ZYLOPRIM ) 100 MG tablet 1 TABLET DAILY (ALONG WITH 300 MG DAILY), 90 DAYS 90 tablet 2  . allopurinoL  (ZYLOPRIM ) 300 MG tablet TAKE 1 TABLET BY MOUTH EVERY DAY 90 tablet 2  . AMIOdarone  (PACERONE ) 400 MG tablet Take 400 mg by mouth once daily    . bisacodyL  (DULCOLAX) 5 mg EC tablet Take 1-2 tablets by mouth twice per day. (Patient taking differently: Take by mouth 2 (two) times daily as needed) 360 tablet 3  . C,E,zinc ,copper 11/omega3s/lut (OCUVITE ADULT 50 PLUS ORAL) Take 1 tablet by mouth once daily    . carboxymethylce-glycern-poly80 0.5-1-0.5 % Drop Apply 1 drop to eye once daily    . carboxymethylcellulose (REFRESH TEARS) 0.5 % ophthalmic solution Place 1-2 drops into both eyes as needed for Dry Eyes    . carvediloL  (COREG ) 3.125 MG tablet Take 3.125 mg by mouth 2 (two) times daily with meals    . cholecalciferol (VITAMIN D3) 1000 unit tablet Take 2,000 Units by mouth once daily    . cyanocobalamin (VITAMIN B12) 1000 MCG tablet Take 1,000 mcg by mouth once daily    . desloratadine (CLARINEX) 5 mg tablet TAKE 1 TABLET BY MOUTH EVERY DAY 90 tablet 3  . ELIQUIS  5 mg tablet Take 1 tablet by mouth 2 (two) times daily.  6  . FLUoxetine (PROZAC) 10 MG capsule TAKE 1 CAPSULE BY MOUTH EVERY DAY (Patient taking differently: Take 10 mg by mouth once daily Total 30 mg, taking 20 mg tablet with 10 mg tablet) 90 capsule 3  . FLUoxetine (PROZAC) 20 MG capsule TAKE 1 CAPSULE BY MOUTH EVERY DAY (Patient taking differently: Take 20 mg by mouth once daily Total 30 mg, taking 20 mg tablet with 10 mg tablet) 90 capsule 3  . fluticasone  propionate (FLONASE ) 50 mcg/actuation nasal spray  SPRAY 2 SPRAYS INTO EACH NOSTRIL EVERY DAY 48 g 1  . lansoprazole (PREVACID) 15 MG DR capsule Take 15 mg by mouth continuously as needed.    . magnesium  oxide (MAG-OX) 400 mg (241.3 mg magnesium ) tablet Take 400 mg by mouth once daily       . metOLazone  (ZAROXOLYN ) 2.5 MG tablet Take 2.5 mg by mouth as needed    . polyethylene glycol (MIRALAX ) packet Take 17 g by mouth once daily Mix in 4-8ounces of fluid prior to taking.    . potassium chloride  (KLOR-CON ) 10 MEQ ER tablet Take 40 mEq by mouth once daily Will take 2 tablets MWF and 1 tablet every other day of the week    . pramipexole  (MIRAPEX ) 1 MG tablet Take 1 tablet (1 mg total) by mouth 4 (four) times daily for 180 days 360 tablet 1  . red yeast rice 600 mg Tab Take 1 tablet by mouth once daily       . risperiDONE (RISPERDAL) 0.25 MG tablet Take 2 tablets (0.5 mg total) by mouth at bedtime 180 tablet 1  . sacubitriL -valsartan  (ENTRESTO )  24-26 mg tablet Take 1 tablet by mouth every 12 (twelve) hours    . selegiline  (ELDEPRYL ) 5 mg capsule Take 1 capsule (5 mg total) by mouth 2 (two) times daily with meals Take with breakfast and lunch. 180 capsule 3  . sennosides-docusate (SENOKOT-S) 8.6-50 mg tablet Take 2 tablets by mouth 2 (two) times daily 360 tablet 3  . TORsemide  (DEMADEX ) 20 MG tablet Take by mouth Taking 40 mg daily on Monday, Wednesday and Friday and 20 mg daily on Tuesday, Thursday, Saturday and Sunday.    . traMADoL  (ULTRAM ) 50 mg tablet Tale 50 milligrams 1-2 daily as needed (Patient taking differently: 50 mg 2 (two) times daily) 60 tablet 2  . vibegron  (GEMTESA ) 75 mg Tab Take 75 mg by mouth once daily     No current facility-administered medications for this visit.    ALLERGIES Allergies  Allergen Reactions  . Linaclotide  Shortness Of Breath  . Codeine Diarrhea and Nausea And Vomiting  . Oxycodone  Hallucination    Altered mental status   . Terazosin Other (See Comments)    Dizziness / altered mental state   . Gabapentin  Dizziness  . Hydrocodone -Acetaminophen  Diarrhea and Nausea And Vomiting  . Lubiprostone  Other (See Comments)    Abdominal cramps  . Omeprazole Other (See Comments), Diarrhea and Nausea And Vomiting    Stomach cramps  . Requip [Ropinirole] Nausea       Vitals:   08/21/24 1319  PainSc:   5  PainLoc: Neck    There is no height or weight on file to calculate BMI.   EXAM   GENERAL: Pleasant male, in no acute distress Normocephalic and atraumatic. Ambulating with wheelchair today.  MUSCULOSKELETAL: Bulk - Obese. Tone - Normal Pronator Drift - Absent bilaterally. Ambulation - Gait and station is impaired. Ambulating with wheelchair in the office today. Romberg - positive. Mild tremor observed in both hands.  R/L 5/5    Shoulder abduction (deltoid/supraspinatus, axillary/suprascapular n, C5) 5/5    Elbow flexion (biceps brachii, musculoskeletal n, C5-6) 5/5    Elbow extension (triceps, radial n, C7) 5/5    Finger adduction (interossei, ulnar n, T1)  4/5    Hip flexion (iliopsoas, L1/L2) 4/5    Knee flexion (hamstrings, sciatic n, L5/S1)  4/5    Knee extension (quadriceps, femoral n, L3/4) 4/5    Ankle dorsiflexion (tibialis anterior, deep fibular n, L4/5) 4/5    Ankle plantarflexion (gastroc, tibial n, S1)   NEUROLOGICAL: MENTAL STATUS: Patient is oriented to person, place and time.   Short-term memory is intact. Long-term memory is intact.   Attention span and concentration are intact.   Naming and repetition are intact. Comprehension is intact.   Expressive speech is intact.   Patient's fund of knowledge is within normal limits for educational level.  CRANIAL NERVES: Visual acuity and visual fields are intact         Extraocular muscles are intact                        Facial sensation is intact bilaterally                Facial strength is intact bilaterally                   Hearing is intact bilaterally  Palate elevates midline,  normal phonation     Shoulder shrug strength is intact                    Tongue protrudes midline                       SENSATION: Pain and temperature (spinothalamic tracts) is normal. Position and vibration (dorsal columns) is normal.  REFLEXES: R/L 2+/2+    Biceps 2+/2+    Brachioradialis  2+/2+    Patellar 2+/2+    Achilles  COORDINATION/CEREBELLAR: Finger to nose testing is normal     MUSCULOSKELETAL: Bulk - Obese Tone - Normal Pronator Drift - Absent bilaterally. Ambulation - ambulating in wheelchair today Tremor -  mild resting tremor noted again today in right hand. Mild hypophonia noted.  R/L 5/5    Shoulder abduction (deltoid/supraspinatus, axillary/suprascapular n, C5) 5/5    Elbow flexion (biceps brachii, musculoskeletal n, C5-6) 5/5    Elbow extension (triceps, radial n, C7) 5/5    Finger adduction (interossei, ulnar n, T1)  5/5    Hip flexion (iliopsoas, L1/L2) 5/5    Knee flexion (hamstrings, sciatic n, L5/S1)  5/5    Knee extension (quadriceps, femoral n, L3/4) 5/5    Ankle dorsiflexion (tibialis anterior, deep fibular n, L4/5) 5/5    Ankle plantarflexion (gastroc, tibial n, S1)   NEUROLOGICAL: MENTAL STATUS: Patient is oriented to person, place and time.   Recent memory is intact.   Remote memory is intact.   Attention span and concentration are intact.   Naming and repetition are intact. Comprehension is intact.   Expressive speech is intact.   Patient's fund of knowledge is normal for educational level.  CRANIAL NERVES: Visual acuity and visual fields are intact         Extraocular muscles are intact                        Facial sensation is intact bilaterally                Facial strength is intact bilaterally                   Hearing is intact bilaterally                              Palate elevates midline, normal phonation     Shoulder shrug strength is intact                    Tongue protrudes midline                        SENSATION: Pain and temperature (spinothalamic tracts) is normal. Position and vibration (dorsal columns) is normal.  REFLEXES: R/L 2+/2+    Biceps 2+/2+    Brachioradialis  2+/2+    Patellar 2+/2+    Achilles  COORDINATION/CEREBELLAR: Finger to nose testing is not tested      PAST MEDICAL HISTORY Past Medical History:  Diagnosis Date  . Adenomatous polyps   . Arrhythmia 2018  . Arthritis 2017  . Cardiac pacemaker in situ   . CHF (congestive heart failure) (CMS/HHS-HCC) 2018  . Coronary artery disease   . Coronary artery disease   . Gastroesophageal reflux   . Heart murmur   . Hyperlipidemia   . Hypertension   .  IBS (irritable bowel syndrome)   . Other constipation 04/29/2019  . Parkinson disease (CMS/HHS-HCC)   . Stage III chronic kidney disease (CMS-HCC) 04/16/2018    PAST SURGICAL HISTORY Past Surgical History:  Procedure Laterality Date  . EGD  05/06/2011   No repeat per RTE  . COLONOSCOPY  08/17/2011   Adenomatous Polyps, [facility] (Father): CBF 08/2014; Pt requests to wait until 01/2015; recall ltr mailed 11/24/2014 (dw)  . APPENDECTOMY  2013  . COLONOSCOPY W/BIOPSY N/A 11/10/2016   Procedure: COLONOSCOPY, FLEXIBLE; WITH BIOPSY, SINGLE OR MULTIPLE;  Surgeon: Abraham Mathew Buerger, MD;  Location: [facility];  Service: Gastroenterology;  Laterality: N/A;  . COLONOSCOPY W/REMOVAL LESIONS BY SNARE N/A 11/10/2016   Procedure: COLONOSCOPY, FLEXIBLE; WITH REMOVAL OF TUMOR(S), POLYP(S), OR OTHER LESION(S) BY SNARE TECHNIQUE;  Surgeon: Darin Lyn Dufault, MD;  Location: [facility];  Service: Gastroenterology;  Laterality: N/A;  . Right carpal tunnel release  10/22/2018   Dr Mardee  . Left carpal tunnel release  05/22/2019   Dr Mardee  . UMBILICAL HERNIA REPAIR  10/23/2019   Dr Lucas Catchings  ---- Robotic assisted  . COLONOSCOPY  10/21/2020   Tubular adenomas/PHx CP/Repeat 6yrs/[facility]  . Colon @ [facility]  12/13/2023   Poor colon prep/ Need to be  reschedule in 2 to 3 months/TKT  . APPENDECTOMY    . cardiac catheterization    . CARDIOVERSION EXTERNAL    . ENDOSCOPIC CARPAL TUNNEL RELEASE Bilateral   . INSERT / REPLACE / REMOVE PACEMAKER    . TRANSVENOUS INSERTION PACEMAKER DUAL CHAMBER LEADS      FAMILY HISTORY Family History  Problem Relation Name Age of Onset  . Myocardial Infarction (Heart attack) Mother ANNIE V Schnarr   . Heart disease Mother LAVONE WEISEL   . Coronary Artery Disease (Blocked arteries around heart) Mother ANNIE V [place]ette   . Myocardial Infarction (Heart attack) Father NORLEEN   . Heart disease Father NORLEEN   . Colon polyps Father NORLEEN 21  . Colon cancer Father NORLEEN   . Stroke Sister    . Stroke Sister [facility]DA [facility]     SOCIAL HISTORY  Social History   Tobacco Use  . Smoking status: Never  . Smokeless tobacco: Never  Vaping Use  . Vaping status: Never Used  Substance Use Topics  . Alcohol  use: No  . Drug use: No     REVIEW OF SYSTEMS:  More than 10 system ROS form was given to the patient to complete and I have reviewed it.  The form was sent for scan to the patient's EHR.  Pertinent positives are mentioned above in the HPI.   DATA  I have personally reviewed all of the data outlined below both prior to the appointment and during the appointment with the patient as appropriate. __________________________________________________________  IMAGING: 11/02/2012 PROCEDURE: CT - CT HEAD WITHOUT CONTRAST RESULT: Comparison: None   Technique: Multiple axial images from the foramen magnum to the vertex  were obtained without IV contrast.   Findings:  There is no evidence for mass effect, midline shift, or extra-axial fluid  collections. There is no evidence for space-occupying lesion,  intracranial hemorrhage, or cortical-based area of infarction.  The osseous structures are unremarkable.   IMPRESSION:  No acute intracranial  process.  ___________________________________________________________    TESTS: 11/02/2012 EKG IMPRESSION: Sinus Rhythm- right bundle branch block with left axis-bifascicular block, left atrial enlargement. Atypical T axis- possible acute process. Abnormal. ___________________________________________________________   Office Visit on 07/03/2024  Component  Date Value Ref Range Status  . WBC (White Blood Cell Count) 07/03/2024 6.4  4.1 - 10.2 10^3/uL Final  . RBC (Red Blood Cell Count) 07/03/2024 4.15 (L)  4.69 - 6.13 10^6/uL Final  . Hemoglobin 07/03/2024 12.9 (L)  14.1 - 18.1 gm/dL Final  . Hematocrit 90/75/7974 40.0  40.0 - 52.0 % Final  . MCV (Mean Corpuscular Volume) 07/03/2024 96.4  80.0 - 100.0 fl Final  . MCH (Mean Corpuscular Hemoglobin) 07/03/2024 31.1  27.0 - 31.2 pg Final  . MCHC (Mean Corpuscular Hemoglobin * 07/03/2024 32.3  32.0 - 36.0 gm/dL Final  . Platelet Count 07/03/2024 195  150 - 450 10^3/uL Final  . RDW-CV (Red Cell Distribution Widt* 07/03/2024 14.5  11.6 - 14.8 % Final  . MPV (Mean Platelet Volume) 07/03/2024 9.9  9.4 - 12.4 fl Final  . Neutrophils 07/03/2024 4.78  1.50 - 7.80 10^3/uL Final  . Lymphocytes 07/03/2024 0.90 (L)  1.00 - 3.60 10^3/uL Final  . Monocytes 07/03/2024 0.46  0.00 - 1.50 10^3/uL Final  . Eosinophils 07/03/2024 0.20  0.00 - 0.55 10^3/uL Final  . Basophils 07/03/2024 0.06  0.00 - 0.09 10^3/uL Final  . Neutrophil % 07/03/2024 74.5 (H)  32.0 - 70.0 % Final  . Lymphocyte % 07/03/2024 14.0  10.0 - 50.0 % Final  . Monocyte % 07/03/2024 7.2  4.0 - 13.0 % Final  . Eosinophil % 07/03/2024 3.1  1.0 - 5.0 % Final  . Basophil% 07/03/2024 0.9  0.0 - 2.0 % Final  . Immature Granulocyte % 07/03/2024 0.3  <=0.7 % Final  . Immature Granulocyte Count 07/03/2024 0.02  <=0.06 10^3/L Final  . Glucose 07/03/2024 98  70 - 110 mg/dL Final  . Sodium 90/75/7974 142  136 - 145 mmol/L Final  . Potassium 07/03/2024 4.5  3.6 - 5.1 mmol/L Final  . Chloride  07/03/2024 109  97 - 109 mmol/L Final  . Carbon Dioxide (CO2) 07/03/2024 27.1  22.0 - 32.0 mmol/L Final  . Urea Nitrogen (BUN) 07/03/2024 31 (H)  7 - 25 mg/dL Final  . Creatinine 90/75/7974 1.4 (H)  0.7 - 1.3 mg/dL Final  . Glomerular Filtration Rate (eGFR) 07/03/2024 52 (L)  >60 mL/min/1.73sq m Final  . Calcium  07/03/2024 9.2  8.7 - 10.3 mg/dL Final  . AST  90/75/7974 16  8 - 39 U/L Final  . ALT  07/03/2024 14  6 - 57 U/L Final  . Alk Phos (alkaline Phosphatase) 07/03/2024 63  34 - 104 U/L Final  . Albumin  07/03/2024 4.0  3.5 - 4.8 g/dL Final  . Bilirubin, Total 07/03/2024 0.6  0.3 - 1.2 mg/dL Final  . Protein, Total 07/03/2024 5.9 (L)  6.1 - 7.9 g/dL Final  . A/G Ratio 90/75/7974 2.1  1.0 - 5.0 gm/dL Final  . Hemoglobin J8R 07/03/2024 5.7 (H)  4.2 - 5.6 % Final  . Average Blood Glucose (Calc) 07/03/2024 117  mg/dL Final  . Cholesterol, Total 07/03/2024 184  100 - 200 mg/dL Final  . Triglyceride 90/75/7974 106  35 - 199 mg/dL Final  . HDL (High Density Lipoprotein) Cho* 07/03/2024 42.2  29.0 - 71.0 mg/dL Final  . LDL Calculated 07/03/2024 878  0 - 130 mg/dL Final  . VLDL Cholesterol 07/03/2024 21  mg/dL Final  . Cholesterol/HDL Ratio 07/03/2024 4.4   Final  . Thyroid  Stimulating Hormone (TSH) 07/03/2024 1.248  0.450-5.330 uIU/ml uIU/mL Final   No follow-ups on file.  This note is partially written by Greig Pouch, scribe, in the presence  of and acting as the scribe of Dr. Arthea Farrow.  I have reviewed, edited and added to the note as needed to reflect my best personal medical judgment.    Dr. Arthea Farrow, MD [facility] [place], [place] Ph:  [phone] Fax:  [phone]

## 2024-08-26 ENCOUNTER — Other Ambulatory Visit: Payer: Self-pay

## 2024-08-26 ENCOUNTER — Encounter: Payer: Self-pay | Admitting: Cardiology

## 2024-08-26 ENCOUNTER — Ambulatory Visit
Admission: RE | Admit: 2024-08-26 | Discharge: 2024-08-26 | Disposition: A | Discharge status: Home / Self Care | Attending: Cardiology | Admitting: Cardiology

## 2024-08-26 ENCOUNTER — Encounter
Admission: RE | Disposition: A | Discharge status: Home / Self Care | Payer: Self-pay | Source: Home / Self Care | Attending: Cardiology

## 2024-08-26 DIAGNOSIS — N189 Chronic kidney disease, unspecified: Secondary | ICD-10-CM | POA: Diagnosis not present

## 2024-08-26 DIAGNOSIS — I130 Hypertensive heart and chronic kidney disease with heart failure and stage 1 through stage 4 chronic kidney disease, or unspecified chronic kidney disease: Secondary | ICD-10-CM | POA: Insufficient documentation

## 2024-08-26 DIAGNOSIS — Z79899 Other long term (current) drug therapy: Secondary | ICD-10-CM | POA: Diagnosis not present

## 2024-08-26 DIAGNOSIS — G20A1 Parkinson's disease without dyskinesia, without mention of fluctuations: Secondary | ICD-10-CM | POA: Diagnosis not present

## 2024-08-26 DIAGNOSIS — I4819 Other persistent atrial fibrillation: Secondary | ICD-10-CM | POA: Diagnosis not present

## 2024-08-26 DIAGNOSIS — I480 Paroxysmal atrial fibrillation: Secondary | ICD-10-CM

## 2024-08-26 DIAGNOSIS — K219 Gastro-esophageal reflux disease without esophagitis: Secondary | ICD-10-CM | POA: Insufficient documentation

## 2024-08-26 DIAGNOSIS — I442 Atrioventricular block, complete: Secondary | ICD-10-CM | POA: Insufficient documentation

## 2024-08-26 DIAGNOSIS — I2510 Atherosclerotic heart disease of native coronary artery without angina pectoris: Secondary | ICD-10-CM | POA: Diagnosis not present

## 2024-08-26 DIAGNOSIS — Z45010 Encounter for checking and testing of cardiac pacemaker pulse generator [battery]: Secondary | ICD-10-CM | POA: Insufficient documentation

## 2024-08-26 DIAGNOSIS — Z7901 Long term (current) use of anticoagulants: Secondary | ICD-10-CM | POA: Diagnosis not present

## 2024-08-26 DIAGNOSIS — I5032 Chronic diastolic (congestive) heart failure: Secondary | ICD-10-CM | POA: Insufficient documentation

## 2024-08-26 DIAGNOSIS — E1122 Type 2 diabetes mellitus with diabetic chronic kidney disease: Secondary | ICD-10-CM | POA: Insufficient documentation

## 2024-08-26 DIAGNOSIS — D6869 Other thrombophilia: Secondary | ICD-10-CM | POA: Diagnosis not present

## 2024-08-26 DIAGNOSIS — E785 Hyperlipidemia, unspecified: Secondary | ICD-10-CM | POA: Insufficient documentation

## 2024-08-26 HISTORY — PX: PPM GENERATOR CHANGEOUT: EP1233

## 2024-08-26 SURGERY — PPM GENERATOR CHANGEOUT
Anesthesia: Moderate Sedation

## 2024-08-26 MED ORDER — FENTANYL CITRATE (PF) 100 MCG/2ML IJ SOLN
INTRAMUSCULAR | Status: DC | PRN
  Administered 2024-08-26: 50 ug via INTRAVENOUS
  Administered 2024-08-26: 12.5 ug via INTRAVENOUS

## 2024-08-26 MED ORDER — SODIUM CHLORIDE 0.9 % IV SOLN: INTRAVENOUS | Status: DC

## 2024-08-26 MED ORDER — APIXABAN 5 MG PO TABS
5.0000 mg | ORAL_TABLET | Freq: Two times a day (BID) | ORAL | 2 refills | Status: AC
Start: 2024-08-29 — End: ?

## 2024-08-26 MED ORDER — LIDOCAINE HCL (PF) 1 % IJ SOLN
INTRAMUSCULAR | Status: DC | PRN
  Administered 2024-08-26: 30 mL

## 2024-08-26 MED ORDER — HEPARIN (PORCINE) IN NACL 1000-0.9 UT/500ML-% IV SOLN
INTRAVENOUS | Status: AC
  Filled 2024-08-26: qty 500

## 2024-08-26 MED ORDER — ONDANSETRON HCL 4 MG/2ML IJ SOLN: 4.0000 mg | Freq: Four times a day (QID) | INTRAMUSCULAR | Status: DC | PRN

## 2024-08-26 MED ORDER — MIDAZOLAM HCL (PF) 2 MG/2ML IJ SOLN
INTRAMUSCULAR | Status: DC | PRN
  Administered 2024-08-26: 1 mg via INTRAVENOUS
  Administered 2024-08-26: .5 mg via INTRAVENOUS

## 2024-08-26 MED ORDER — HEPARIN (PORCINE) IN NACL 1000-0.9 UT/500ML-% IV SOLN
INTRAVENOUS | Status: DC | PRN
  Administered 2024-08-26: 500 mL

## 2024-08-26 MED ORDER — FENTANYL CITRATE (PF) 50 MCG/ML IJ SOSY
PREFILLED_SYRINGE | INTRAMUSCULAR | Status: AC
  Filled 2024-08-26: qty 1

## 2024-08-26 MED ORDER — POVIDONE-IODINE 10 % EX SWAB
2.0000 | Freq: Once | CUTANEOUS | Status: AC
  Administered 2024-08-26: 2 via TOPICAL

## 2024-08-26 MED ORDER — FENTANYL CITRATE (PF) 100 MCG/2ML IJ SOLN
INTRAMUSCULAR | Status: AC
  Filled 2024-08-26: qty 2

## 2024-08-26 MED ORDER — CHLORHEXIDINE GLUCONATE 4 % EX SOLN
4.0000 | Freq: Once | CUTANEOUS | Status: AC
  Administered 2024-08-26: 4 via TOPICAL

## 2024-08-26 MED ORDER — LIDOCAINE HCL (PF) 1 % IJ SOLN
INTRAMUSCULAR | Status: AC
  Filled 2024-08-26: qty 60

## 2024-08-26 MED ORDER — SODIUM CHLORIDE 0.9 % IV SOLN
80.0000 mg | INTRAVENOUS | Status: AC
  Administered 2024-08-26: 80 mg
  Filled 2024-08-26: qty 2

## 2024-08-26 MED ORDER — CEFAZOLIN SODIUM-DEXTROSE 2-4 GM/100ML-% IV SOLN
INTRAVENOUS | Status: AC
  Filled 2024-08-26: qty 100

## 2024-08-26 MED ORDER — CEFAZOLIN SODIUM-DEXTROSE 2-4 GM/100ML-% IV SOLN
2.0000 g | INTRAVENOUS | Status: AC
  Administered 2024-08-26: 2 g via INTRAVENOUS

## 2024-08-26 MED ORDER — MIDAZOLAM HCL 2 MG/2ML IJ SOLN
INTRAMUSCULAR | Status: AC
  Filled 2024-08-26: qty 2

## 2024-08-26 MED ORDER — ACETAMINOPHEN 325 MG PO TABS: 325.0000 mg | ORAL_TABLET | ORAL | Status: DC | PRN

## 2024-08-26 SURGICAL SUPPLY — 7 items
CABLE SURG 12 DISP A/V CHANNEL (MISCELLANEOUS) IMPLANT
DEVICE DSSCT PLSMBLD 3.0S LGHT (MISCELLANEOUS) IMPLANT
PACEMAKER ASSURITY DR-RF (Pacemaker) IMPLANT
POUCH AIGIS-R ANTIBACT PPM MED (Mesh General) IMPLANT
SUT MNCRL AB 4-0 PS2 18 (SUTURE) IMPLANT
SUT VIC AB 2-0 CT1 TAPERPNT 27 (SUTURE) IMPLANT
TRAY PACEMAKER INSERTION (PACKS) ×1 IMPLANT

## 2024-08-26 NOTE — Discharge Instructions (Addendum)
 *Resume Eliquis  on the morning of 08/29/24.   Implantable Cardiac Device Battery Change, Care After  This sheet gives you information about how to care for yourself after your procedure. Your health care provider may also give you more specific instructions. If you have problems or questions, contact your health care provider. What can I expect after the procedure? After your procedure, it is common to have: Pain or soreness at the site where the cardiac device was inserted. Swelling at the site where the cardiac device was inserted. You should received an information card for your new device in 4-8 weeks. Follow these instructions at home: Incision care  Keep the incision clean and dry. Do not take baths, swim, or use a hot tub until after your wound check.  Pat the area dry with a clean towel. Do not rub the area. This may cause bleeding. Follow instructions from your health care provider about how to take care of your incision. Make sure you: If your incision is sealed with Steri-strips or staples, you may shower 7 days after your procedure or when told by your provider. Do not remove the steri-strips or let the shower hit directly on your site. You may wash around your site with soap and water.  Check your incision area every day for signs of infection. Check for: More redness, swelling, or pain. More fluid or blood. Warmth. Pus or a bad smell. Activity Do not lift anything that is heavier than 10 lb (4.5 kg) until your health care provider says it is okay to do so. For the first week, or as long as told by your health care provider: Avoid lifting your affected arm higher than your shoulder. After 1 week, Be gentle when you move your arms over your head. It is okay to raise your arm to comb your hair. Avoid strenuous exercise. Ask your health care provider when it is okay to: Resume your normal activities. Return to work or school. Resume sexual activity. Eating and drinking Eat  a heart-healthy diet. This should include plenty of fresh fruits and vegetables, whole grains, low-fat dairy products, and lean protein like chicken and fish. Limit alcohol  intake to no more than 1 drink a day for non-pregnant women and 2 drinks a day for men. One drink equals 12 oz of beer, 5 oz of wine, or 1 oz of hard liquor. Check ingredients and nutrition facts on packaged foods and beverages. Avoid the following types of food: Food that is high in salt (sodium). Food that is high in saturated fat, like full-fat dairy or red meat. Food that is high in trans fat, like fried food. Food and drinks that are high in sugar. Lifestyle Do not use any products that contain nicotine or tobacco, such as cigarettes and e-cigarettes. If you need help quitting, ask your health care provider. Take steps to manage and control your weight. Once cleared, get regular exercise. Aim for 150 minutes of moderate-intensity exercise (such as walking or yoga) or 75 minutes of vigorous exercise (such as running or swimming) each week. Manage other health problems, such as diabetes or high blood pressure. Ask your health care provider how you can manage these conditions. General instructions Do not drive for 24 hours after your procedure if you were given a medicine to help you relax (sedative). Take over-the-counter and prescription medicines only as told by your health care provider. Avoid putting pressure on the area where the cardiac device was placed. If you need an MRI after  your cardiac device has been placed, be sure to tell the health care provider who orders the MRI that you have a cardiac device. Avoid close and prolonged exposure to electrical devices that have strong magnetic fields. These include: Cell phones. Avoid keeping them in a pocket near the cardiac device, and try using the ear opposite the cardiac device. MP3 players. Household appliances, like microwaves. Metal detectors. Electric  generators. High-tension wires. Keep all follow-up visits as directed by your health care provider. This is important. Contact a health care provider if: You have pain at the incision site that is not relieved by over-the-counter or prescription medicines. You have any of these around your incision site or coming from it: More redness, swelling, or pain. Fluid or blood. Warmth to the touch. Pus or a bad smell. You have a fever. You feel brief, occasional palpitations, light-headedness, or any symptoms that you think might be related to your heart. Get help right away if: You experience chest pain that is different from the pain at the cardiac device site. You develop a red streak that extends above or below the incision site. You experience shortness of breath. You have palpitations or an irregular heartbeat. You have light-headedness that does not go away quickly. You faint or have dizzy spells. Your pulse suddenly drops or increases rapidly and does not return to normal. You begin to gain weight and your legs and ankles swell. Summary After your procedure, it is common to have pain, soreness, and some swelling where the cardiac device was inserted. Make sure to keep your incision clean and dry. Follow instructions from your health care provider about how to take care of your incision. Check your incision every day for signs of infection, such as more pain or swelling, pus or a bad smell, warmth, or leaking fluid and blood. Avoid strenuous exercise and lifting your left arm higher than your shoulder for 2 weeks, or as long as told by your health care provider. This information is not intended to replace advice given to you by your health care provider. Make sure you discuss any questions you have with your health care provider.

## 2024-08-26 NOTE — H&P (Signed)
 Electrophysiology Note:   Date:  08/26/24  ID:  Mattheu, Brodersen December 25, 1945, MRN [id number]   Primary Cardiologist: Evalene Lunger, MD Electrophysiologist: Fonda Kitty, MD       History of Present Illness:   Matthew Howe is a 78 y.o. male with h/o parkinson's, HTN, hyperlipidemia, CKD, GERD, CAD, complete heart block s/p pacemaker, persistent atrial fibrillation, chronic diastolic heart failure who is being seen today for follow up pacemaker management.   Discussed the use of AI scribe software for clinical note transcription with the patient, who gave verbal consent to proceed.   History of Present Illness Matthew Howe is a 78 year old male with a pacemaker and atrial fibrillation who presents for device check and battery replacement. He was referred by Dr. Fernande due to his retirement.   The battery of his pacemaker is nearing the end of its life. He is completely dependent on the pacemaker. He is taking amiodarone  to manage his atrial fibrillation and Eliquis  to prevent strokes. He has not had any recent AF. He has not experienced any falls recently, although he did slide down to the floor two weeks ago without falling, requiring assistance from EMTs to get up.   His Parkinson's disease has progressed, leading to increased slowness and reliance on a walker. Despite the challenges, he reports being 'all right for an old man' and notes that he has been luckier than some with the disease, but it still presents significant challenges.   No new or acute complaints today.   Interval: Patient presents today for planned pacemaker generator change. Reports feeling relatively well. No new or acute complaints.    Review of systems complete and found to be negative unless listed in HPI.    EP Information / Studies Reviewed:      EKG Interpretation Date/Time:                  Tuesday June 18 2024 10:07:13 EDT Ventricular Rate:         62 PR Interval:                   QRS Duration:              190 QT Interval:                 568 QTC Calculation:576 R Axis:                         -82   Text Interpretation:AV dual-paced rhythm When compared with ECG of 07-Dec-2023 09:10, No significant change was found Confirmed by Kitty Fonda [phone]) on 06/18/2024 10:19:01 AM    Echo 07/2023:  1. Left ventricular ejection fraction, by estimation, is 50 to 55%. The  left ventricle has low normal function. The left ventricle has no regional  wall motion abnormalities. There is mild left ventricular hypertrophy.  Left ventricular diastolic  parameters are indeterminate.   2. Right ventricular systolic function is normal. The right ventricular  size is normal.   3. Left atrial size was severely dilated.   4. The mitral valve is normal in structure. Mild mitral valve  regurgitation.   5. The aortic valve is tricuspid. Aortic valve regurgitation is mild to  moderate.   6. Aortic dilatation noted. There is mild dilatation of the aortic root,  measuring 40 mm. There is mild dilatation of the ascending aorta,  measuring 41 mm.    Risk Assessment/Calculations:  CHA2DS2-VASc Score = 5   This indicates a 7.2% annual risk of stroke. The patient's score is based upon: CHF History: 1 HTN History: 1 Diabetes History: 0 Stroke History: 0 Vascular Disease History: 1 Age Score: 2 Gender Score: 0               Physical Exam:    Today's Vitals   08/26/24 1116  Resp: 20  Temp: 98 F (36.7 C)  TempSrc: Oral  SpO2: 92%  Weight: 112.5 kg  Height: 6' (1.829 m)  PainSc: 0-No pain   Body mass index is 33.63 kg/m.   GEN: Well nourished, well developed in no acute distress NECK: No JVD CARDIAC: Normal rate, regular rhythm. Well healed left chest pacer pocket. RESPIRATORY:  Clear to auscultation without rales, wheezing or rhonchi  ABDOMEN: Soft, non-distended EXTREMITIES:  No edema; No deformity    ASSESSMENT AND PLAN:     #. CHB s/p pacemaker: #. PPM at ERI -Risks,  benefits, and alternatives to pacemaker pulse generator replacement were discussed in detail today.  The patient understands that risks include but are not limited to bleeding, infection, pneumothorax, perforation, tamponade, vascular damage, renal failure, MI, stroke, death, inappropriate shocks, damage to his existing leads, and lead dislodgement and wishes to proceed.    #. Persistent atrial fibrillation: AT/AF burden 0% on device. #. High risk medication use: Amiodarone . LFTs and TSH normal 06/2024. - Continue amiodarone  200mg  once daily. - Monitor AF burden with pacemaker.    #. Secondary hypercoagulable state due to AF: Denies bleeding issues. No falls. -Continue Eliquis  5mg  BID.     Signed, Fonda Kitty, MD

## 2024-09-03 ENCOUNTER — Telehealth: Payer: Self-pay

## 2024-09-03 NOTE — Telephone Encounter (Signed)
 Follow-up after same day discharge: Implant date: 08/26/2024 MD: JP Device: PPM Location: L chest    Wound check visit: 09/10/2024 90 day MD follow-up: 11/26/2024  Remote Transmission received:yes  Dressing/sling removed:   Confirm OAC restart on: LVM for pt to call back   Please continue to monitor your cardiac device site for redness, swelling, and drainage. Call the device clinic at [phone] if you experience these symptoms, fever/chills, or have questions about your device.   Remote monitoring is used to monitor your cardiac device from home. This monitoring is scheduled every 91 days by our office. It allows us  to keep an eye on the functioning of your device to ensure it is working properly.

## 2024-09-05 ENCOUNTER — Encounter

## 2024-09-09 NOTE — Progress Notes (Unsigned)
 Electrophysiology Clinic Note    Date:  09/10/2024  Patient ID:  Matthew, [name] [birth year] Howe, MRN [id number] PCP:  Epifanio Alm SQUIBB, MD  Cardiologist:  Evalene Lunger, MD  Cardiology APP:  Donette Ellouise LABOR, FNP  Electrophysiologist:  Fonda Kitty, MD  Electrophysiology APP:  Staphanie Harbison, NP     Discussed the use of AI scribe software for clinical note transcription with the patient, who gave verbal consent to proceed.   Patient Profile    Chief Complaint: PPM gen change, AFIB  History of Present Illness: Matthew Howe is a 78 y.o. male with PMH notable for persis Afib, CHB s/p PPM, HFpEF, CAD, HTN, HLD, parkinson's disease; seen today for Fonda Kitty, MD for routine electrophysiology follow-up s/p Pacemaker gen change.  Device clinic was alerted for elevated AFib burden with device nearing ERI. Plan for gen change then treat AFib.   He is s/p gen change 08/2024.   On follow-up today, he is asymptomatic of arrhythmia. His only complaint today is some neck soreness, for which he plans to take a PRN tramadol  when he returns home. He denies palpitations, chest pain, chest pressure, worsening lower extremity edema (is a chronic issue per wife), or reduced energy.  He has no complaints related to L chest device site. He has not been showering, steri-strips remain in place.  He resume eliquis  on 08/29/2024 as instructed. No missed doses since, no bleeding concerns.  He continues to take 400mg  amiodarone  daily.   His wife joins for visit who provides majority of HPI.      Arrhythmia/Device History St. Jude dual chamber PPM, imp 2016; dx CHB - gen change 2025   AAD -  Amiodarone     ROS:  Please see the history of present illness. All other systems are reviewed and otherwise negative.    Physical Exam    VS:  BP (!) 90/50 (BP Location: Right Arm, Patient Position: Sitting, Cuff Size: Large)   Pulse 70   Ht 6' (1.829 m)   Wt 250 lb (113.4 kg)   SpO2 95%    BMI 33.91 kg/m  BMI: Body mass index is 33.91 kg/m.           Wt Readings from Last 3 Encounters:  09/10/24 250 lb (113.4 kg)  08/26/24 248 lb (112.5 kg)  06/18/24 250 lb (113.4 kg)     GEN- The patient is well appearing, alert and oriented x 3 today, flat affect Lungs- Clear to ausculation bilaterally, normal work of breathing.  Heart- Regular rate and rhythm, no murmurs, rubs or gallops Extremities- 1+ peripheral edema, warm, dry Skin-  Steri-strips removed. Ecchymosis noted around L chest site. Incision appears c/d/i  Device interrogation done today and reviewed by myself:  Battery 6-10 years Lead thresholds, impedence, sensing stable  Dependent down to VVI 40 Presents in persis AFib since gen change No changes made today   Studies Reviewed   Previous EP, cardiology notes.    EKG is ordered. Personal review of EKG from today shows:    EKG Interpretation Date/Time:  Tuesday September 10 2024 09:50:00 EST Ventricular Rate:  70 PR Interval:    QRS Duration:  188 QT Interval:  544 QTC Calculation: 587 R Axis:   -87  Text Interpretation: atrial flutter with Ventricular-paced rhythm Confirmed by Dorothea Yow [phone]) on 09/10/2024 12:43:22 PM     TTE, 07/31/2023  1. Left ventricular ejection fraction, by estimation, is 50 to 55%. The left ventricle has low  normal function. The left ventricle has no regional wall motion abnormalities. There is mild left ventricular hypertrophy. Left ventricular diastolic parameters are indeterminate.   2. Right ventricular systolic function is normal. The right ventricular size is normal.   3. Left atrial size was severely dilated.   4. The mitral valve is normal in structure. Mild mitral valve regurgitation.   5. The aortic valve is tricuspid. Aortic valve regurgitation is mild to moderate.   6. Aortic dilatation noted. There is mild dilatation of the aortic root, measuring 40 mm. There is mild dilatation of the ascending aorta,  measuring 41 mm.    Assessment and Plan     #) persis AFib #) amiodarone  monitoring Patient remains in persis Afib No arrhythmia awareness Ventricular rates well controlled Will plan for DCCV after he has been anticoagulated for 4 complete weeks, 12/18 or after Pre-procedure labs today Will continue 400mg  amiodarone  daily until DCCV, then reduce to 200mg  daily  #) Hypercoag d/t persis afib CHA2DS2-VASc Score = at least 5 [CHF History: 1, HTN History: 1, Diabetes History: 0, Stroke History: 0, Vascular Disease History: 1, Age Score: 2, Gender Score: 0].  Therefore, the patient's annual risk of stroke is 7.2 %.    Stroke ppx - 5mg  eliquis  BID, appropriately dosed No bleeding concerns  #) CHB s/p PPM Recent generator change L chest CIED site appears to be healing well Ok to shower No activity restrictions   Informed Consent   Shared Decision Making/Informed Consent The risks (stroke, cardiac arrhythmias rarely resulting in the need for a temporary or permanent pacemaker, skin irritation or burns and complications associated with conscious sedation including aspiration, arrhythmia, respiratory failure and death), benefits (restoration of normal sinus rhythm) and alternatives of a direct current cardioversion were explained in detail to Matthew Howe and he agrees to proceed.        Current medicines are reviewed at length with the patient today.   The patient does not have concerns regarding his medicines.  The following changes were made today:   Reduce amiodarone  to 200mg  daily after cardioversion  Labs/ tests ordered today include:  Orders Placed This Encounter  Procedures   CBC   Basic metabolic panel with GFR   EKG 87-Ozji     Disposition: Follow up with Dr. Kennyth or EP APP in 2-3 months     Signed, Shravya Wickwire, NP  09/10/24  12:45 PM  Electrophysiology [facility]

## 2024-09-09 NOTE — Patient Instructions (Signed)
 OK to shower Let warm soapy water run down incision Do not scrub incision Pat dry with towel  Keep incision open to air - no ointments, creams, salves, or bandages.  Call device clinic if incision opens, has drainage, or begins to hurt more.

## 2024-09-10 ENCOUNTER — Ambulatory Visit

## 2024-09-10 ENCOUNTER — Encounter: Payer: Self-pay | Admitting: Cardiology

## 2024-09-10 ENCOUNTER — Ambulatory Visit: Attending: Cardiology | Admitting: Cardiology

## 2024-09-10 VITALS — BP 90/50 | HR 70 | Ht 72.0 in | Wt 250.0 lb

## 2024-09-10 DIAGNOSIS — Z79899 Other long term (current) drug therapy: Secondary | ICD-10-CM | POA: Diagnosis present

## 2024-09-10 DIAGNOSIS — I442 Atrioventricular block, complete: Secondary | ICD-10-CM | POA: Insufficient documentation

## 2024-09-10 DIAGNOSIS — Z950 Presence of cardiac pacemaker: Secondary | ICD-10-CM | POA: Insufficient documentation

## 2024-09-10 DIAGNOSIS — I480 Paroxysmal atrial fibrillation: Secondary | ICD-10-CM | POA: Diagnosis not present

## 2024-09-10 DIAGNOSIS — I5033 Acute on chronic diastolic (congestive) heart failure: Secondary | ICD-10-CM | POA: Diagnosis not present

## 2024-09-10 DIAGNOSIS — D6869 Other thrombophilia: Secondary | ICD-10-CM | POA: Insufficient documentation

## 2024-09-10 DIAGNOSIS — I4819 Other persistent atrial fibrillation: Secondary | ICD-10-CM | POA: Insufficient documentation

## 2024-09-10 LAB — CUP PACEART INCLINIC DEVICE CHECK
Battery Remaining Longevity: 127 mo
Battery Voltage: 2.99 V
Brady Statistic RA Percent Paced: 0 %
Brady Statistic RV Percent Paced: 99.99 %
Date Time Interrogation Session: 20251202125006
Implantable Lead Connection Status: 753985
Implantable Lead Connection Status: 753985
Implantable Lead Implant Date: 20160212
Implantable Lead Implant Date: 20160212
Implantable Lead Location: 753859
Implantable Lead Location: 753860
Implantable Lead Model: 1948
Implantable Pulse Generator Implant Date: 20251117
Lead Channel Impedance Value: 450 Ohm
Lead Channel Impedance Value: 575 Ohm
Lead Channel Pacing Threshold Amplitude: 1 V
Lead Channel Pacing Threshold Amplitude: 1 V
Lead Channel Pacing Threshold Pulse Width: 0.5 ms
Lead Channel Pacing Threshold Pulse Width: 0.5 ms
Lead Channel Sensing Intrinsic Amplitude: 4.5 mV
Lead Channel Setting Pacing Amplitude: 1.375
Lead Channel Setting Pacing Amplitude: 3.5 V
Lead Channel Setting Pacing Pulse Width: 0.5 ms
Lead Channel Setting Sensing Sensitivity: 4 mV
Pulse Gen Model: 2272
Pulse Gen Serial Number: 8320443

## 2024-09-11 ENCOUNTER — Ambulatory Visit: Payer: Self-pay | Admitting: Cardiology

## 2024-09-11 LAB — BASIC METABOLIC PANEL WITH GFR
BUN/Creatinine Ratio: 26 — ABNORMAL HIGH (ref 10–24)
BUN: 46 mg/dL — ABNORMAL HIGH (ref 8–27)
CO2: 16 mmol/L — ABNORMAL LOW (ref 20–29)
Calcium: 9.2 mg/dL (ref 8.6–10.2)
Chloride: 111 mmol/L — ABNORMAL HIGH (ref 96–106)
Creatinine, Ser: 1.76 mg/dL — ABNORMAL HIGH (ref 0.76–1.27)
Glucose: 103 mg/dL — ABNORMAL HIGH (ref 70–99)
Potassium: 4.5 mmol/L (ref 3.5–5.2)
Sodium: 150 mmol/L — ABNORMAL HIGH (ref 134–144)
eGFR: 39 mL/min/1.73 — ABNORMAL LOW (ref >59)

## 2024-09-11 LAB — CBC
Hematocrit: 42.3 % (ref 37.5–51.0)
Hemoglobin: 13.8 g/dL (ref 13.0–17.7)
MCH: 30.8 pg (ref 26.6–33.0)
MCHC: 32.6 g/dL (ref 31.5–35.7)
MCV: 94 fL (ref 79–97)
Platelets: 214 x10E3/uL (ref 150–450)
RBC: 4.48 x10E6/uL (ref 4.14–5.80)
RDW: 13.5 % (ref 11.6–15.4)
WBC: 8 x10E3/uL (ref 3.4–10.8)

## 2024-09-30 NOTE — Anesthesia Preprocedure Evaluation (Signed)
 "                                  Anesthesia Evaluation  Patient identified by MRN, date of birth, ID band Patient confused  General Assessment Comment:Alert to place, situation, self but not to date  Reviewed: Allergy & Precautions, NPO status , Patient's Chart, lab work & pertinent test results  History of Anesthesia Complications (+) history of anesthetic complications  Airway Mallampati: III  TM Distance: <3 FB Neck ROM: full    Dental  (+) Chipped, Poor Dentition, Missing   Pulmonary    Pulmonary exam normal        Cardiovascular Exercise Tolerance: Poor hypertension, (-) angina + CAD and +CHF (chronic diastolic CHF)  + dysrhythmias + pacemaker + Valvular Problems/Murmurs AI  Rhythm:Irregular Rate:Normal     Neuro/Psych Parkinson's disease  Neuromuscular disease  negative psych ROS   GI/Hepatic Neg liver ROS,GERD  Controlled,,  Endo/Other  negative endocrine ROS    Renal/GU CRFRenal disease  negative genitourinary   Musculoskeletal  (+) Arthritis ,    Abdominal  (+) + obese  Peds  Hematology negative hematology ROS (+)   Anesthesia Other Findings Wife states patient has been confused for months. Hypernatremia noted on last set of labs early December.   Past Medical History: No date: Arthritis No date: Atrial fibrillation (HCC) No date: Carpal tunnel syndrome on both sides No date: CHF (congestive heart failure) (HCC) 10/2018: CKD (chronic kidney disease)     Comment:  recent diagnosis No date: Complete heart block (HCC)     Comment:  a. s/p St. Jude PPM 11/2014. No date: Complication of anesthesia     Comment:  stayed overnight dt under too long d/t taking               Parkinson's medication (2013?) No date: Coronary artery disease No date: GERD (gastroesophageal reflux disease)     Comment:  not as bad as it used to be No date: Heart murmur 07/2018: History of recent blood transfusion No date: Hyperlipidemia No date:  Hypertension No date: IBS (irritable bowel syndrome) No date: Lower extremity edema No date: Parkinson's disease (HCC) No date: Presence of permanent cardiac pacemaker     Comment:  St Jude No date: Shortness of breath dyspnea No date: Stage 3b chronic kidney disease (CKD) (HCC)  Past Surgical History: 12/2010: APPENDECTOMY 08/06/2018: BACK SURGERY     Comment:  lower lumbarsacral region, has rods in place. (done at               [facility]) 11/20/2012: CARDIAC CATHETERIZATION     Comment:  Known CAD with one vessel coronary disease of left               descending artery by cardiac cath.  11/21/2016: CARDIOVERSION; N/A     Comment:  Procedure: CARDIOVERSION;  Surgeon: Elspeth JAYSON Sage, MD;               Location: [facility] ORS;  Service: Cardiovascular;                Laterality: N/A; 03/08/2019: CARDIOVERSION; N/A     Comment:  Procedure: CARDIOVERSION;  Surgeon: Okey Vina GAILS, MD;                Location: [facility] ENDOSCOPY;  Service: Cardiovascular;  Laterality: N/A; 02/08/2023: CARDIOVERSION; N/A     Comment:  Procedure: CARDIOVERSION;  Surgeon: Darliss Rogue,               MD;  Location: [facility] ORS;  Service: Cardiovascular;                Laterality: N/A; 05/10/2023: CARDIOVERSION; N/A     Comment:  Procedure: CARDIOVERSION;  Surgeon: Perla Evalene PARAS,               MD;  Location: [facility] ORS;  Service: Cardiovascular;                Laterality: N/A; 10/22/2018: CARPAL TUNNEL RELEASE; Right     Comment:  Procedure: CARPAL TUNNEL RELEASE;  Surgeon: Mardee Lynwood SQUIBB, MD;  Location: [facility] ORS;  Service: Orthopedics;               Laterality: Right; 05/22/2019: CARPAL TUNNEL RELEASE; Left     Comment:  Procedure: CARPAL TUNNEL RELEASE - LEFT;  Surgeon:               Mardee Lynwood SQUIBB, MD;  Location: [facility] ORS;  Service:               Orthopedics;  Laterality: Left; 2018: COLONOSCOPY 10/21/2020: COLONOSCOPY; N/A     Comment:  Procedure: COLONOSCOPY;  Surgeon: Dessa Reyes ORN,               MD;  Location: [facility] ENDOSCOPY;  Service: Endoscopy;                Laterality: N/A; 10/23/2019: HERNIA REPAIR     Comment:  umbilical hernia repair No date: INSERT / REPLACE / REMOVE PACEMAKER 11-21-14: PACEMAKER INSERTION     Comment:  STJ Assurity dual chamber pacemaker implanted by Dr               Kelsie for CHB 11/21/2014: PERMANENT PACEMAKER INSERTION; N/A     Comment:  Procedure: PERMANENT PACEMAKER INSERTION;  Surgeon:               Lynwood Kelsie, MD;  Location: [facility] CATH LAB;  Service:               Cardiovascular;  Laterality: N/A; 11/21/2016: TEE WITHOUT CARDIOVERSION; N/A     Comment:  Procedure: TRANSESOPHAGEAL ECHOCARDIOGRAM (TEE);                Surgeon: Elspeth JAYSON Sage, MD;  Location: [facility] ORS;                Service: Cardiovascular;  Laterality: N/A; 11/09/2022: TEE WITHOUT CARDIOVERSION; N/A     Comment:  Procedure: TRANSESOPHAGEAL ECHOCARDIOGRAM;  Surgeon:               Darliss Rogue, MD;  Location: [facility] ORS;  Service:               Cardiovascular;  Laterality: N/A; No date: UPPER GI ENDOSCOPY     Reproductive/Obstetrics negative OB ROS                              Anesthesia Physical Anesthesia Plan  ASA: 3  Anesthesia Plan: General   Post-op Pain Management:    Induction: Intravenous  PONV Risk Score and Plan: Propofol  infusion and TIVA  Airway Management Planned: Natural Airway and Nasal Cannula  Additional Equipment:   Intra-op Plan:   Post-operative Plan:   Informed Consent: I have reviewed the patients History and Physical, chart, labs and discussed the procedure including the risks, benefits and alternatives for the proposed anesthesia with the patient or authorized representative who has indicated his/her understanding and acceptance.     Dental Advisory Given  Plan Discussed with: Anesthesiologist, CRNA and Surgeon  Anesthesia Plan Comments:         Anesthesia Quick  Evaluation  "

## 2024-10-01 ENCOUNTER — Ambulatory Visit: Admission: RE | Admit: 2024-10-01 | Discharge: 2024-10-01 | Disposition: A | Discharge status: Home / Self Care

## 2024-10-01 ENCOUNTER — Encounter: Payer: Self-pay | Admitting: Anesthesiology

## 2024-10-01 ENCOUNTER — Other Ambulatory Visit: Payer: Self-pay

## 2024-10-01 ENCOUNTER — Ambulatory Visit: Payer: Self-pay | Admitting: Anesthesiology

## 2024-10-01 ENCOUNTER — Encounter: Admission: RE | Disposition: A | Payer: Self-pay

## 2024-10-01 DIAGNOSIS — I4892 Unspecified atrial flutter: Secondary | ICD-10-CM | POA: Diagnosis not present

## 2024-10-01 DIAGNOSIS — D6859 Other primary thrombophilia: Secondary | ICD-10-CM | POA: Insufficient documentation

## 2024-10-01 DIAGNOSIS — Z79899 Other long term (current) drug therapy: Secondary | ICD-10-CM | POA: Insufficient documentation

## 2024-10-01 DIAGNOSIS — E785 Hyperlipidemia, unspecified: Secondary | ICD-10-CM | POA: Insufficient documentation

## 2024-10-01 DIAGNOSIS — Z950 Presence of cardiac pacemaker: Secondary | ICD-10-CM | POA: Diagnosis not present

## 2024-10-01 DIAGNOSIS — I4819 Other persistent atrial fibrillation: Secondary | ICD-10-CM | POA: Insufficient documentation

## 2024-10-01 DIAGNOSIS — Z7901 Long term (current) use of anticoagulants: Secondary | ICD-10-CM | POA: Diagnosis not present

## 2024-10-01 DIAGNOSIS — I2510 Atherosclerotic heart disease of native coronary artery without angina pectoris: Secondary | ICD-10-CM | POA: Diagnosis not present

## 2024-10-01 DIAGNOSIS — I442 Atrioventricular block, complete: Secondary | ICD-10-CM | POA: Insufficient documentation

## 2024-10-01 DIAGNOSIS — I110 Hypertensive heart disease with heart failure: Secondary | ICD-10-CM | POA: Diagnosis not present

## 2024-10-01 DIAGNOSIS — G20A1 Parkinson's disease without dyskinesia, without mention of fluctuations: Secondary | ICD-10-CM | POA: Insufficient documentation

## 2024-10-01 DIAGNOSIS — I5030 Unspecified diastolic (congestive) heart failure: Secondary | ICD-10-CM | POA: Insufficient documentation

## 2024-10-01 DIAGNOSIS — I4891 Unspecified atrial fibrillation: Secondary | ICD-10-CM | POA: Diagnosis present

## 2024-10-01 HISTORY — PX: CARDIOVERSION: SHX1299

## 2024-10-01 SURGERY — CARDIOVERSION
Anesthesia: General

## 2024-10-01 MED ORDER — PROPOFOL 10 MG/ML IV BOLUS
INTRAVENOUS | Status: DC | PRN
  Administered 2024-10-01: 70 mg via INTRAVENOUS

## 2024-10-01 MED ORDER — SODIUM CHLORIDE 0.9 % IV SOLN: INTRAVENOUS | Status: DC

## 2024-10-01 NOTE — Telephone Encounter (Signed)
 Error

## 2024-10-01 NOTE — H&P (Signed)
 History and Physical Interval Note:  10/01/2024 7:30 AM  Matthew Howe  has presented today for procedure, with the diagnosis of persistent atrial fibrillation.  The various methods of treatment have been discussed with the patient and family. After consideration of risks, benefits and other options for treatment, the patient has consented to  Procedures: CARDIOVERSION (N/A) as a therapeutic intervention.  The patient's history has been reviewed, patient examined, no change in status, stable for procedure.  I have reviewed the patient's chart and labs.  Questions were answered to the patient's satisfaction.     [facility]

## 2024-10-01 NOTE — Anesthesia Postprocedure Evaluation (Signed)
"   Anesthesia Post Note  Patient: Matthew Howe  Procedure(s) Performed: CARDIOVERSION  Patient location during evaluation: PACU Anesthesia Type: General Level of consciousness: awake and alert Pain management: pain level controlled Vital Signs Assessment: post-procedure vital signs reviewed and stable Respiratory status: spontaneous breathing, nonlabored ventilation and respiratory function stable Cardiovascular status: blood pressure returned to baseline and stable Postop Assessment: no apparent nausea or vomiting Anesthetic complications: no   No notable events documented.   Last Vitals:  Vitals:   10/01/24 0800 10/01/24 0815  BP: (!) 108/54 118/64  Pulse: 63 63  Resp: 13 15  Temp: (!) 36.4 C   SpO2: 96% 96%    Last Pain:  Vitals:   10/01/24 0815  TempSrc:   PainSc: 0-No pain                 Camellia Merilee Louder      "

## 2024-10-01 NOTE — CV Procedure (Signed)
" ° °  DIRECT CURRENT CARDIOVERSION  NAME:  Matthew Howe    MRN: [id number] DOB:  10-31-1945    ADMIT DATE: 10/01/2024  INDICATIONS: Persistent atrial fibrillation  PROCEDURE:   Informed consent was obtained prior to the procedure. The risks, benefits and alternatives for the procedure were discussed and the patient comprehended these risks.  Risks include, but are not limited to, vomiting, nausea, somnolence, bleeding, low blood pressure, aspiration, pneumonia, infection, and death.  After a procedural time-out, the patient was sedated by the anesthesia service. Once an appropriate level of sedation was confirmed, the patient was cardioverted x 1 with 200J of biphasic synchronized energy.  The patient converted to A-V paced rhythm.  There were no apparent complications.  The patient had normal neuro status and respiratory status post procedure with vitals stable as recorded elsewhere.  Adequate airway was maintained throughout and vital signs monitored per protocol.  COMPLICATIONS:    Complications: No complications Patient tolerated procedure well.  Signed, Caron Poser, MD      "

## 2024-10-01 NOTE — Transfer of Care (Signed)
 Immediate Anesthesia Transfer of Care Note  Patient: Matthew Howe  Procedure(s) Performed: CARDIOVERSION  Patient Location: Short Stay  Anesthesia Type:General  Level of Consciousness: drowsy  Airway & Oxygen Therapy: Patient Spontanous Breathing and Patient connected to nasal cannula oxygen  Post-op Assessment: Report given to RN, Post -op Vital signs reviewed and stable, and Patient moving all extremities  Post vital signs: Reviewed and stable  Last Vitals:  Vitals Value Taken Time  BP 120/61 10/01/24 07:53  Temp    Pulse 66 10/01/24 07:54  Resp 14 10/01/24 07:54  SpO2 96 % 10/01/24 07:54    Last Pain:  Vitals:   10/01/24 0715  TempSrc: Temporal  PainSc: 0-No pain         Complications: No notable events documented.

## 2024-10-07 ENCOUNTER — Encounter

## 2024-10-15 ENCOUNTER — Encounter: Payer: Self-pay | Admitting: Family

## 2024-10-15 ENCOUNTER — Other Ambulatory Visit: Payer: Self-pay

## 2024-10-15 MED ORDER — ENTRESTO 24-26 MG PO TABS: 1.0000 | ORAL_TABLET | Freq: Two times a day (BID) | ORAL | 1 refills | Status: AC

## 2024-10-29 ENCOUNTER — Ambulatory Visit

## 2024-10-29 DIAGNOSIS — I442 Atrioventricular block, complete: Secondary | ICD-10-CM

## 2024-10-31 ENCOUNTER — Telehealth: Payer: Self-pay | Admitting: *Deleted

## 2024-10-31 LAB — CUP PACEART REMOTE DEVICE CHECK
Battery Remaining Longevity: 93 mo
Battery Remaining Percentage: 95.5 %
Battery Voltage: 3.01 V
Brady Statistic AP VP Percent: 99 %
Brady Statistic AP VS Percent: 1 %
Brady Statistic AS VP Percent: 1 %
Brady Statistic AS VS Percent: 0 %
Brady Statistic RA Percent Paced: 21 %
Brady Statistic RV Percent Paced: 99 %
Date Time Interrogation Session: 20260120020021
Implantable Lead Connection Status: 753985
Implantable Lead Connection Status: 753985
Implantable Lead Implant Date: 20160212
Implantable Lead Implant Date: 20160212
Implantable Lead Location: 753859
Implantable Lead Location: 753860
Implantable Lead Model: 1948
Implantable Pulse Generator Implant Date: 20251117
Lead Channel Impedance Value: 430 Ohm
Lead Channel Impedance Value: 540 Ohm
Lead Channel Pacing Threshold Amplitude: 1.125 V
Lead Channel Pacing Threshold Pulse Width: 0.5 ms
Lead Channel Sensing Intrinsic Amplitude: 3.8 mV
Lead Channel Setting Pacing Amplitude: 1.375
Lead Channel Setting Pacing Amplitude: 3.5 V
Lead Channel Setting Pacing Pulse Width: 0.5 ms
Lead Channel Setting Sensing Sensitivity: 4 mV
Pulse Gen Model: 2272
Pulse Gen Serial Number: 8320443

## 2024-10-31 NOTE — Telephone Encounter (Signed)
 Alert received from [employer]:  Pacemaker: Scheduled remote reviewed. Normal device function.  Presenting rhythm: AFL/VP Ongoing AFL since 10/11/2024, AFL burden 80%, Eliquis  per [employer] EMR, good V-rate control. Sent to triage Previous successful cardioversion in 09/2024 Next remote transmission per protocol.  ML, [facility] ___________________________________________________________________________  [name] and spoke with patient's wife as patient was unavailable at time of call  This RN notified spouse that patient was back in Afib as of 10/11/24  Patient's wife was disappointed to hear this news  Patient's spouse denied patient complaining of any symptoms described by this RN and stated the patient didn't appear or act abnormal   Patient's wife confirmed patient medication compliance with Eliquis  5mg  Once Daily, Carvedilol  3.125 mg BID and Amiodarone  200 mg once every day  Routing to Dot Needle, NP who saw patient last, and also Dr. Kennyth for awareness and next steps

## 2024-11-01 NOTE — Progress Notes (Signed)
 Remote PPM Transmission

## 2024-11-04 ENCOUNTER — Ambulatory Visit: Payer: Self-pay | Admitting: Cardiology

## 2024-11-13 NOTE — Telephone Encounter (Signed)
 Called and spoke with wife, aware most of reported concerns are not attributable to his afib.  Advised to contact PCP to discuss those concerns further.  Advised to keep next weeks OV with Elvie Needle, NP to discuss tx plan for his afib. Wife verbalized understanding and agreeable to plan.

## 2024-11-26 ENCOUNTER — Ambulatory Visit: Admitting: Cardiology

## 2025-01-28 ENCOUNTER — Ambulatory Visit

## 2025-04-29 ENCOUNTER — Ambulatory Visit

## 2025-05-06 ENCOUNTER — Ambulatory Visit: Admitting: Physician Assistant

## 2025-07-29 ENCOUNTER — Ambulatory Visit

## 2025-10-28 ENCOUNTER — Ambulatory Visit
